# Patient Record
Sex: Male | Born: 1955
Health system: Southern US, Community
[De-identification: ages and names within clinical notes are randomized; demographics above are authoritative.]

## PROBLEM LIST (undated history)

## (undated) DIAGNOSIS — N189 Chronic kidney disease, unspecified: Secondary | ICD-10-CM

## (undated) DIAGNOSIS — I2510 Atherosclerotic heart disease of native coronary artery without angina pectoris: Secondary | ICD-10-CM

## (undated) DIAGNOSIS — F17200 Nicotine dependence, unspecified, uncomplicated: Secondary | ICD-10-CM

## (undated) DIAGNOSIS — R739 Hyperglycemia, unspecified: Secondary | ICD-10-CM

## (undated) DIAGNOSIS — R339 Retention of urine, unspecified: Secondary | ICD-10-CM

## (undated) DIAGNOSIS — I10 Essential (primary) hypertension: Secondary | ICD-10-CM

## (undated) DIAGNOSIS — N23 Unspecified renal colic: Secondary | ICD-10-CM

## (undated) DIAGNOSIS — S4991XA Unspecified injury of right shoulder and upper arm, initial encounter: Secondary | ICD-10-CM

## (undated) DIAGNOSIS — I255 Ischemic cardiomyopathy: Secondary | ICD-10-CM

## (undated) DIAGNOSIS — E785 Hyperlipidemia, unspecified: Secondary | ICD-10-CM

## (undated) DIAGNOSIS — M1990 Unspecified osteoarthritis, unspecified site: Secondary | ICD-10-CM

## (undated) DIAGNOSIS — E119 Type 2 diabetes mellitus without complications: Secondary | ICD-10-CM

## (undated) DIAGNOSIS — Z87442 Personal history of urinary calculi: Secondary | ICD-10-CM

## (undated) DIAGNOSIS — Z978 Presence of other specified devices: Secondary | ICD-10-CM

## (undated) HISTORY — DX: Unspecified renal colic: N23

## (undated) HISTORY — DX: Type 2 diabetes mellitus without complications: E11.9

## (undated) HISTORY — DX: Hyperlipidemia, unspecified: E78.5

## (undated) HISTORY — DX: Hyperglycemia, unspecified: R73.9

## (undated) HISTORY — DX: Essential (primary) hypertension: I10

## (undated) HISTORY — DX: Nicotine dependence, unspecified, uncomplicated: F17.200

## (undated) HISTORY — DX: Unspecified osteoarthritis, unspecified site: M19.90

## (undated) HISTORY — DX: Unspecified injury of right shoulder and upper arm, initial encounter: S49.91XA

---

## 2009-10-11 DIAGNOSIS — S4991XA Unspecified injury of right shoulder and upper arm, initial encounter: Secondary | ICD-10-CM

## 2009-10-11 HISTORY — DX: Unspecified injury of right shoulder and upper arm, initial encounter: S49.91XA

## 2010-08-27 DIAGNOSIS — E785 Hyperlipidemia, unspecified: Secondary | ICD-10-CM

## 2010-08-27 DIAGNOSIS — E119 Type 2 diabetes mellitus without complications: Secondary | ICD-10-CM

## 2010-08-27 DIAGNOSIS — E1169 Type 2 diabetes mellitus with other specified complication: Secondary | ICD-10-CM | POA: Insufficient documentation

## 2010-08-27 DIAGNOSIS — I10 Essential (primary) hypertension: Secondary | ICD-10-CM

## 2010-10-21 ENCOUNTER — Encounter: Payer: Self-pay | Admitting: Physician Assistant

## 2012-08-16 ENCOUNTER — Other Ambulatory Visit: Payer: Self-pay | Admitting: Physician Assistant

## 2012-08-18 ENCOUNTER — Encounter: Payer: Self-pay | Admitting: *Deleted

## 2012-11-07 ENCOUNTER — Ambulatory Visit: Payer: Self-pay | Admitting: Nurse Practitioner

## 2012-11-11 ENCOUNTER — Ambulatory Visit (INDEPENDENT_AMBULATORY_CARE_PROVIDER_SITE_OTHER): Payer: BC Managed Care – PPO | Admitting: Nurse Practitioner

## 2012-11-11 ENCOUNTER — Encounter: Payer: Self-pay | Admitting: Nurse Practitioner

## 2012-11-11 VITALS — BP 148/83 | HR 86 | Temp 97.8°F | Ht 70.0 in | Wt 245.0 lb

## 2012-11-11 DIAGNOSIS — Z125 Encounter for screening for malignant neoplasm of prostate: Secondary | ICD-10-CM

## 2012-11-11 DIAGNOSIS — E119 Type 2 diabetes mellitus without complications: Secondary | ICD-10-CM

## 2012-11-11 DIAGNOSIS — I10 Essential (primary) hypertension: Secondary | ICD-10-CM

## 2012-11-11 DIAGNOSIS — M7989 Other specified soft tissue disorders: Secondary | ICD-10-CM

## 2012-11-11 DIAGNOSIS — M799 Soft tissue disorder, unspecified: Secondary | ICD-10-CM

## 2012-11-11 DIAGNOSIS — E785 Hyperlipidemia, unspecified: Secondary | ICD-10-CM

## 2012-11-11 LAB — COMPLETE METABOLIC PANEL WITH GFR
ALT: 54 U/L — ABNORMAL HIGH (ref 0–53)
AST: 60 U/L — ABNORMAL HIGH (ref 0–37)
Alkaline Phosphatase: 63 U/L (ref 39–117)
BUN: 11 mg/dL (ref 6–23)
Creat: 0.9 mg/dL (ref 0.50–1.35)
Potassium: 5.3 mEq/L (ref 3.5–5.3)

## 2012-11-11 LAB — POCT GLYCOSYLATED HEMOGLOBIN (HGB A1C): Hemoglobin A1C: 7.8

## 2012-11-11 MED ORDER — CHOLINE FENOFIBRATE 135 MG PO CPDR: 135.0000 mg | DELAYED_RELEASE_CAPSULE | Freq: Every day | ORAL | Status: DC

## 2012-11-11 MED ORDER — SITAGLIPTIN PHOS-METFORMIN HCL 50-1000 MG PO TABS: 1.0000 | ORAL_TABLET | Freq: Two times a day (BID) | ORAL | Status: DC

## 2012-11-11 MED ORDER — GLIMEPIRIDE 2 MG PO TABS: 4.0000 mg | ORAL_TABLET | Freq: Two times a day (BID) | ORAL | Status: DC

## 2012-11-11 MED ORDER — ROSUVASTATIN CALCIUM 10 MG PO TABS: 10.0000 mg | ORAL_TABLET | Freq: Every day | ORAL | Status: DC

## 2012-11-11 MED ORDER — AMLODIPINE BESYLATE 10 MG PO TABS: 10.0000 mg | ORAL_TABLET | Freq: Every day | ORAL | Status: DC

## 2012-11-11 MED ORDER — GLYBURIDE 5 MG PO TABS: 5.0000 mg | ORAL_TABLET | Freq: Every day | ORAL | Status: DC

## 2012-11-11 NOTE — Progress Notes (Signed)
Subjective:    Patient ID: Tyler Garner, male    DOB: 1956/04/01, 57 y.o.   MRN: [id number]  Hyperlipidemia This is a chronic problem. The current episode started more than 1 year ago. The problem is uncontrolled. Recent lipid tests were reviewed and are high. There are no known factors aggravating his hyperlipidemia. Pertinent negatives include no leg pain, myalgias or shortness of breath. Current antihyperlipidemic treatment includes statins and fibric acid derivatives. The current treatment provides moderate improvement of lipids. Compliance problems include adherence to diet and adherence to exercise.  Risk factors for coronary artery disease include hypertension and male sex.  Hypertension This is a chronic problem. The current episode started more than 1 year ago. The problem has been waxing and waning since onset. The problem is uncontrolled. Pertinent negatives include no blurred vision, headaches, malaise/fatigue, palpitations, peripheral edema, shortness of breath or sweats. There are no associated agents to hypertension. Risk factors for coronary artery disease include diabetes mellitus, dyslipidemia and male gender. Past treatments include calcium channel blockers and ACE inhibitors (not taking ace like suppose to- didn't realize he needed to stay on it.). The current treatment provides moderate improvement. Compliance problems include diet and exercise.   Diabetes He presents for his follow-up diabetic visit. He has type 2 diabetes mellitus. No MedicAlert identification noted. The initial diagnosis of diabetes was made 3 years ago. His disease course has been stable. There are no hypoglycemic associated symptoms. Pertinent negatives for hypoglycemia include no headaches or sweats. Pertinent negatives for diabetes include no blurred vision, no fatigue, no foot ulcerations, no polydipsia, no polyphagia, no polyuria, no visual change and no weight loss. There are no hypoglycemic complications.  Symptoms are stable. There are no diabetic complications. Risk factors for coronary artery disease include diabetes mellitus, dyslipidemia, hypertension, male sex and obesity. Current diabetic treatment includes oral agent (dual therapy) and diet. He is compliant with treatment most of the time. He is following a generally healthy diet. When asked about meal planning, he reported none. He has not had a previous visit with a dietician. He rarely participates in exercise. His home blood glucose trend is fluctuating minimally. His breakfast blood glucose is taken between 8-9 am. His breakfast blood glucose range is generally 90-110 mg/dl. An ACE inhibitor/angiotensin II receptor blocker is not being taken. He does not see a podiatrist.Eye exam is current (03/2012).   * Nodule at right elbow has been there e a couple of months an d is starting to cause pain and also seems to be getting a little bigger.   Review of Systems  Constitutional: Negative for weight loss, malaise/fatigue and fatigue.  Eyes: Negative for blurred vision.  Respiratory: Negative for shortness of breath.   Cardiovascular: Negative for palpitations.  Endocrine: Negative for polydipsia, polyphagia and polyuria.  Musculoskeletal: Negative for myalgias.  Neurological: Negative for headaches.  All other systems reviewed and are negative.       Objective:   Physical Exam  Constitutional: He is oriented to person, place, and time. He appears well-developed and well-nourished.  HENT:  Head: Normocephalic.  Right Ear: External ear normal.  Left Ear: External ear normal.  Nose: Nose normal.  Mouth/Throat: Oropharynx is clear and moist.  Eyes: EOM are normal. Pupils are equal, round, and reactive to light.  Neck: Normal range of motion. Neck supple. No thyromegaly present.  Cardiovascular: Normal rate, regular rhythm, normal heart sounds and intact distal pulses.   No murmur heard. Pulmonary/Chest: Effort normal and breath sounds  normal. He has no wheezes. He has no rales.  Abdominal: Soft. Bowel sounds are normal.  Genitourinary:  Refuses rectal exam  Musculoskeletal: Normal range of motion.  Neurological: He is alert and oriented to person, place, and time.  Skin: Skin is warm and dry.  Psychiatric: He has a normal mood and affect. His behavior is normal. Judgment and thought content normal.   BP 148/83  Pulse 86  Temp(Src) 97.8 F (36.6 C) (Oral)  Ht 5\' 10"  (1.778 m)  Wt 245 lb (111.131 kg)  BMI 35.15 kg/m2 Results for orders placed in visit on 11/11/12  POCT GLYCOSYLATED HEMOGLOBIN (HGB A1C)      Result Value Range   Hemoglobin A1C 7.8%           Assessment & Plan:   1. Diabetes   2. Other and unspecified hyperlipidemia   3. Screening for prostate cancer   4. Hypertension   5. Soft tissue mass    Orders Placed This Encounter  Procedures  . [place] Misc Soft Tissue    Standing Status: Future     Number of Occurrences:      Standing Expiration Date: 01/11/2014    Order Specific Question:  Reason for Exam (SYMPTOM  OR DIAGNOSIS REQUIRED)    Answer:  mass right inner elbow    Order Specific Question:  Preferred imaging location?    Answer:  [facility] ([place])  . COMPLETE METABOLIC PANEL WITH GFR  . NMR Lipoprofile with Lipids  . PSA  . HgB A1c   Meds ordered this encounter  Medications  . DISCONTD: amLODipine (NORVASC) 10 MG tablet    Sig:   . glyBURIDE (DIABETA) 5 MG tablet    Sig: Take 1 tablet (5 mg total) by mouth daily with breakfast.    Dispense:  30 tablet    Refill:  5    Order Specific Question:  Supervising Provider    Answer:  Ernestina Penna [[id number]]  . amLODipine (NORVASC) 10 MG tablet    Sig: Take 1 tablet (10 mg total) by mouth daily.    Dispense:  30 tablet    Refill:  5    Order Specific Question:  Supervising Provider    Answer:  Ernestina Penna [[id number]]  . Choline Fenofibrate (TRILIPIX) 135 MG capsule    Sig: Take 1 capsule (135 mg total) by mouth daily.    Dispense:   30 capsule    Refill:  5    Order Specific Question:  Supervising Provider    Answer:  Ernestina Penna [[id number]]  . glimepiride (AMARYL) 2 MG tablet    Sig: Take 2 tablets (4 mg total) by mouth 2 (two) times daily.    Dispense:  30 tablet    Refill:  5    Order Specific Question:  Supervising Provider    Answer:  Ernestina Penna [[id number]]  . sitaGLIPtan-metformin (JANUMET) 50-1000 MG per tablet    Sig: Take 1 tablet by mouth 2 (two) times daily with a meal.    Dispense:  60 tablet    Refill:  5    Order Specific Question:  Supervising Provider    Answer:  Ernestina Penna [[id number]]  . rosuvastatin (CRESTOR) 10 MG tablet    Sig: Take 1 tablet (10 mg total) by mouth daily.    Dispense:  30 tablet    Refill:  5    Order Specific Question:  Supervising Provider    Answer:  Ernestina Penna [phone]  Continue all meds Labs pending Diet and exercise encouraged Added glyburide 5mg  PO qd  [name] Daphine Deutscher, [name]

## 2012-11-11 NOTE — Patient Instructions (Signed)
Health Maintenance, Males A healthy lifestyle and preventative care can promote health and wellness.  Maintain regular health, dental, and eye exams.  Eat a healthy diet. Foods like vegetables, fruits, whole grains, low-fat dairy products, and lean protein foods contain the nutrients you need without too many calories. Decrease your intake of foods high in solid fats, added sugars, and salt. Get information about a proper diet from your caregiver, if necessary.  Regular physical exercise is one of the most important things you can do for your health. Most adults should get at least 150 minutes of moderate-intensity exercise (any activity that increases your heart rate and causes you to sweat) each week. In addition, most adults need muscle-strengthening exercises on 2 or more days a week.   Maintain a healthy weight. The body mass index (BMI) is a screening tool to identify possible weight problems. It provides an estimate of body fat based on height and weight. Your caregiver can help determine your BMI, and can help you achieve or maintain a healthy weight. For adults 20 years and older:  A BMI below 18.5 is considered underweight.  A BMI of 18.5 to 24.9 is normal.  A BMI of 25 to 29.9 is considered overweight.  A BMI of 30 and above is considered obese.  Maintain normal blood lipids and cholesterol by exercising and minimizing your intake of saturated fat. Eat a balanced diet with plenty of fruits and vegetables. Blood tests for lipids and cholesterol should begin at age 20 and be repeated every 5 years. If your lipid or cholesterol levels are high, you are over 50, or you are a high risk for heart disease, you may need your cholesterol levels checked more frequently.Ongoing high lipid and cholesterol levels should be treated with medicines, if diet and exercise are not effective.  If you smoke, find out from your caregiver how to quit. If you do not use tobacco, do not start.  If you  choose to drink alcohol, do not exceed 2 drinks per day. One drink is considered to be 12 ounces (355 mL) of beer, 5 ounces (148 mL) of wine, or 1.5 ounces (44 mL) of liquor.  Avoid use of street drugs. Do not share needles with anyone. Ask for help if you need support or instructions about stopping the use of drugs.  High blood pressure causes heart disease and increases the risk of stroke. Blood pressure should be checked at least every 1 to 2 years. Ongoing high blood pressure should be treated with medicines if weight loss and exercise are not effective.  If you are 45 to 58 years old, ask your caregiver if you should take aspirin to prevent heart disease.  Diabetes screening involves taking a blood sample to check your fasting blood sugar level. This should be done once every 3 years, after age 45, if you are within normal weight and without risk factors for diabetes. Testing should be considered at a younger age or be carried out more frequently if you are overweight and have at least 1 risk factor for diabetes.  Colorectal cancer can be detected and often prevented. Most routine colorectal cancer screening begins at the age of 50 and continues through age 75. However, your caregiver may recommend screening at an earlier age if you have risk factors for colon cancer. On a yearly basis, your caregiver may provide home test kits to check for hidden blood in the stool. Use of a small camera at the end of a tube,   to directly examine the colon (sigmoidoscopy or colonoscopy), can detect the earliest forms of colorectal cancer. Talk to your caregiver about this at age 50, when routine screening begins. Direct examination of the colon should be repeated every 5 to 10 years through age 75, unless early forms of pre-cancerous polyps or small growths are found.  Hepatitis C blood testing is recommended for all people born from 1945 through 1965 and any individual with known risks for hepatitis C.  Healthy  men should no longer receive prostate-specific antigen (PSA) blood tests as part of routine cancer screening. Consult with your caregiver about prostate cancer screening.  Testicular cancer screening is not recommended for adolescents or adult males who have no symptoms. Screening includes self-exam, caregiver exam, and other screening tests. Consult with your caregiver about any symptoms you have or any concerns you have about testicular cancer.  Practice safe sex. Use condoms and avoid high-risk sexual practices to reduce the spread of sexually transmitted infections (STIs).  Use sunscreen with a sun protection factor (SPF) of 30 or greater. Apply sunscreen liberally and repeatedly throughout the day. You should seek shade when your shadow is shorter than you. Protect yourself by wearing long sleeves, pants, a wide-brimmed hat, and sunglasses year round, whenever you are outdoors.  Notify your caregiver of new moles or changes in moles, especially if there is a change in shape or color. Also notify your caregiver if a mole is larger than the size of a pencil eraser.  A one-time screening for abdominal aortic aneurysm (AAA) and surgical repair of large AAAs by sound wave imaging (ultrasonography) is recommended for ages 65 to 75 years who are current or former smokers.  Stay current with your immunizations. Document Released: 11/07/2007 Document Revised: 08/03/2011 Document Reviewed: 10/06/2010 [employer] Patient Information 2014 [employer].  

## 2012-11-14 LAB — NMR LIPOPROFILE WITH LIPIDS
Cholesterol, Total: 167 mg/dL (ref <200)
HDL Particle Number: 22.9 umol/L — ABNORMAL LOW (ref >=30.5)
LDL (calc): 76 mg/dL (ref <100)
LDL Particle Number: 1230 nmol/L — ABNORMAL HIGH (ref <1000)
LP-IR Score: 60 — ABNORMAL HIGH (ref <=45)
Triglycerides: 339 mg/dL — ABNORMAL HIGH (ref <150)
VLDL Size: 49.2 nm — ABNORMAL HIGH (ref <=46.6)

## 2012-11-16 ENCOUNTER — Other Ambulatory Visit: Payer: Self-pay | Admitting: Family Medicine

## 2012-11-16 ENCOUNTER — Ambulatory Visit (HOSPITAL_COMMUNITY)
Admission: RE | Admit: 2012-11-16 | Discharge: 2012-11-16 | Disposition: A | Discharge status: Home / Self Care | Payer: BC Managed Care – PPO | Source: Ambulatory Visit | Attending: Nurse Practitioner | Admitting: Nurse Practitioner

## 2012-11-16 ENCOUNTER — Other Ambulatory Visit: Payer: Self-pay | Admitting: Nurse Practitioner

## 2012-11-16 DIAGNOSIS — R229 Localized swelling, mass and lump, unspecified: Secondary | ICD-10-CM | POA: Insufficient documentation

## 2012-11-16 DIAGNOSIS — M7989 Other specified soft tissue disorders: Secondary | ICD-10-CM

## 2012-11-18 ENCOUNTER — Other Ambulatory Visit: Payer: Self-pay | Admitting: Family Medicine

## 2012-11-18 ENCOUNTER — Ambulatory Visit (HOSPITAL_COMMUNITY)
Admission: RE | Admit: 2012-11-18 | Discharge: 2012-11-18 | Disposition: A | Discharge status: Home / Self Care | Payer: BC Managed Care – PPO | Source: Ambulatory Visit | Attending: Family Medicine | Admitting: Family Medicine

## 2012-11-18 DIAGNOSIS — M7989 Other specified soft tissue disorders: Secondary | ICD-10-CM

## 2012-12-02 NOTE — Progress Notes (Signed)
Called patient to come in for psa last visit one wasn't ordered

## 2012-12-05 ENCOUNTER — Other Ambulatory Visit (INDEPENDENT_AMBULATORY_CARE_PROVIDER_SITE_OTHER): Payer: BC Managed Care – PPO

## 2012-12-05 DIAGNOSIS — Z125 Encounter for screening for malignant neoplasm of prostate: Secondary | ICD-10-CM

## 2012-12-05 NOTE — Progress Notes (Signed)
Patient came in for labs only.

## 2012-12-06 LAB — PSA: PSA: 0.68 ng/mL (ref <=4.00)

## 2013-02-21 ENCOUNTER — Encounter: Payer: Self-pay | Admitting: Nurse Practitioner

## 2013-02-21 ENCOUNTER — Ambulatory Visit (INDEPENDENT_AMBULATORY_CARE_PROVIDER_SITE_OTHER): Payer: BC Managed Care – PPO | Admitting: Nurse Practitioner

## 2013-02-21 VITALS — BP 141/86 | HR 86 | Temp 98.0°F | Ht 69.0 in | Wt 235.0 lb

## 2013-02-21 DIAGNOSIS — E785 Hyperlipidemia, unspecified: Secondary | ICD-10-CM

## 2013-02-21 DIAGNOSIS — I10 Essential (primary) hypertension: Secondary | ICD-10-CM

## 2013-02-21 DIAGNOSIS — E119 Type 2 diabetes mellitus without complications: Secondary | ICD-10-CM

## 2013-02-21 DIAGNOSIS — Z23 Encounter for immunization: Secondary | ICD-10-CM

## 2013-02-21 LAB — POCT GLYCOSYLATED HEMOGLOBIN (HGB A1C): Hemoglobin A1C: 7

## 2013-02-21 MED ORDER — CHOLINE FENOFIBRATE 135 MG PO CPDR: 135.0000 mg | DELAYED_RELEASE_CAPSULE | Freq: Every day | ORAL | Status: DC

## 2013-02-21 MED ORDER — ROSUVASTATIN CALCIUM 10 MG PO TABS: 10.0000 mg | ORAL_TABLET | Freq: Every day | ORAL | Status: DC

## 2013-02-21 MED ORDER — SITAGLIPTIN PHOS-METFORMIN HCL 50-1000 MG PO TABS: 1.0000 | ORAL_TABLET | Freq: Two times a day (BID) | ORAL | Status: DC

## 2013-02-21 MED ORDER — GLIMEPIRIDE 2 MG PO TABS: 4.0000 mg | ORAL_TABLET | Freq: Two times a day (BID) | ORAL | Status: DC

## 2013-02-21 MED ORDER — AMLODIPINE BESYLATE 10 MG PO TABS: 10.0000 mg | ORAL_TABLET | Freq: Every day | ORAL | Status: DC

## 2013-02-21 NOTE — Progress Notes (Signed)
Subjective:    Patient ID: Tyler Garner, male    DOB: 1956/02/16, 57 y.o.   MRN: [id number]  Hyperlipidemia This is a chronic problem. The current episode started more than 1 year ago. The problem is uncontrolled. Recent lipid tests were reviewed and are high. There are no known factors aggravating his hyperlipidemia. Pertinent negatives include no leg pain, myalgias or shortness of breath. Current antihyperlipidemic treatment includes statins and fibric acid derivatives. The current treatment provides moderate improvement of lipids. Compliance problems include adherence to diet and adherence to exercise.  Risk factors for coronary artery disease include hypertension and male sex.  Hypertension This is a chronic problem. The current episode started more than 1 year ago. The problem has been waxing and waning since onset. The problem is uncontrolled. Pertinent negatives include no blurred vision, headaches, malaise/fatigue, palpitations, peripheral edema, shortness of breath or sweats. There are no associated agents to hypertension. Risk factors for coronary artery disease include diabetes mellitus, dyslipidemia and male gender. Past treatments include calcium channel blockers and ACE inhibitors (not taking ace like suppose to- didn't realize he needed to stay on it.). The current treatment provides moderate improvement. Compliance problems include diet and exercise.   Diabetes He presents for his follow-up diabetic visit. He has type 2 diabetes mellitus. No MedicAlert identification noted. The initial diagnosis of diabetes was made 3 years ago. His disease course has been stable. There are no hypoglycemic associated symptoms. Pertinent negatives for hypoglycemia include no headaches or sweats. Pertinent negatives for diabetes include no blurred vision, no fatigue, no foot ulcerations, no polydipsia, no polyphagia, no polyuria, no visual change and no weight loss. There are no hypoglycemic complications.  Symptoms are stable. There are no diabetic complications. Risk factors for coronary artery disease include diabetes mellitus, dyslipidemia, hypertension, male sex and obesity. Current diabetic treatment includes oral agent (dual therapy) and diet. He is compliant with treatment most of the time. He is following a generally healthy diet. When asked about meal planning, he reported none. He has not had a previous visit with a dietician. He rarely participates in exercise. His home blood glucose trend is fluctuating minimally. His breakfast blood glucose is taken between 8-9 am. His breakfast blood glucose range is generally 90-110 mg/dl. An ACE inhibitor/angiotensin II receptor blocker is not being taken. He does not see a podiatrist.Eye exam is current (03-May-2012).   *Wife died 3 months ago   Review of Systems  Constitutional: Negative for weight loss, malaise/fatigue and fatigue.  Eyes: Negative for blurred vision.  Respiratory: Negative for shortness of breath.   Cardiovascular: Negative for palpitations.  Endocrine: Negative for polydipsia, polyphagia and polyuria.  Musculoskeletal: Negative for myalgias.  Neurological: Negative for headaches.  All other systems reviewed and are negative.       Objective:   Physical Exam  Constitutional: He is oriented to person, place, and time. He appears well-developed and well-nourished.  HENT:  Head: Normocephalic.  Right Ear: External ear normal.  Left Ear: External ear normal.  Nose: Nose normal.  Mouth/Throat: Oropharynx is clear and moist.  Eyes: EOM are normal. Pupils are equal, round, and reactive to light.  Neck: Normal range of motion. Neck supple. No thyromegaly present.  Cardiovascular: Normal rate, regular rhythm, normal heart sounds and intact distal pulses.   No murmur heard. Pulmonary/Chest: Effort normal and breath sounds normal. He has no wheezes. He has no rales.  Abdominal: Soft. Bowel sounds are normal.  Genitourinary:   Refuses rectal exam  Musculoskeletal: Normal range of motion.  Neurological: He is alert and oriented to person, place, and time.  Skin: Skin is warm and dry.  Psychiatric: He has a normal mood and affect. His behavior is normal. Judgment and thought content normal.   BP 141/86  Pulse 86  Temp(Src) 98 F (36.7 C) (Oral)  Ht 5\' 9"  (1.753 m)  Wt 235 lb (106.595 kg)  BMI 34.69 kg/m2 Results for orders placed in visit on 02/21/13  POCT GLYCOSYLATED HEMOGLOBIN (HGB A1C)      Result Value Range   Hemoglobin A1C 7.0           Assessment & Plan:   1. HTN (hypertension)   2. Hyperlipidemia   3. DM (diabetes mellitus)    Orders Placed This Encounter  Procedures  . CMP14+EGFR  . NMR, lipoprofile  . POCT glycosylated hemoglobin (Hb A1C)   Meds ordered this encounter  Medications  . glimepiride (AMARYL) 2 MG tablet    Sig: Take 2 tablets (4 mg total) by mouth 2 (two) times daily.    Dispense:  30 tablet    Refill:  5    Order Specific Question:  Supervising Provider    Answer:  Ernestina Penna [[id number]]  . Choline Fenofibrate (TRILIPIX) 135 MG capsule    Sig: Take 1 capsule (135 mg total) by mouth daily.    Dispense:  30 capsule    Refill:  5    Order Specific Question:  Supervising Provider    Answer:  Ernestina Penna [[id number]]  . rosuvastatin (CRESTOR) 10 MG tablet    Sig: Take 1 tablet (10 mg total) by mouth daily.    Dispense:  30 tablet    Refill:  5    Order Specific Question:  Supervising Provider    Answer:  Ernestina Penna [[id number]]  . amLODipine (NORVASC) 10 MG tablet    Sig: Take 1 tablet (10 mg total) by mouth daily.    Dispense:  30 tablet    Refill:  5    Order Specific Question:  Supervising Provider    Answer:  Ernestina Penna [[id number]]  . sitaGLIPtin-metformin (JANUMET) 50-1000 MG per tablet    Sig: Take 1 tablet by mouth 2 (two) times daily with a meal.    Dispense:  60 tablet    Refill:  5    Order Specific Question:  Supervising Provider    Answer:   Deborra Medina    Continue all meds Labs pending Diet and exercise encouraged   [name] Daphine Deutscher, [name]

## 2013-02-21 NOTE — Patient Instructions (Signed)

## 2013-02-23 ENCOUNTER — Other Ambulatory Visit: Payer: Self-pay | Admitting: Nurse Practitioner

## 2013-02-23 LAB — CMP14+EGFR
Albumin/Globulin Ratio: 2.1 (ref 1.1–2.5)
Albumin: 4.7 g/dL (ref 3.5–5.5)
Alkaline Phosphatase: 71 IU/L (ref 39–117)
BUN/Creatinine Ratio: 16 (ref 9–20)
BUN: 12 mg/dL (ref 6–24)
CO2: 23 mmol/L (ref 18–29)
Chloride: 102 mmol/L (ref 97–108)
Creatinine, Ser: 0.73 mg/dL — ABNORMAL LOW (ref 0.76–1.27)
GFR calc Af Amer: 118 mL/min/{1.73_m2} (ref >59)
GFR calc non Af Amer: 102 mL/min/{1.73_m2} (ref >59)
Globulin, Total: 2.2 g/dL (ref 1.5–4.5)
Sodium: 143 mmol/L (ref 134–144)
Total Bilirubin: 0.3 mg/dL (ref 0.0–1.2)

## 2013-02-23 LAB — NMR, LIPOPROFILE
Cholesterol: 183 mg/dL (ref <200)
HDL Particle Number: 20.1 umol/L — ABNORMAL LOW (ref >=30.5)
LDL Particle Number: 2746 nmol/L — ABNORMAL HIGH (ref <1000)
LDLC SERPL CALC-MCNC: 112 mg/dL — ABNORMAL HIGH (ref <100)
LP-IR Score: 73 — ABNORMAL HIGH (ref <=45)
Small LDL Particle Number: >2300 nmol/L — ABNORMAL HIGH (ref <=527)

## 2013-02-23 MED ORDER — ROSUVASTATIN CALCIUM 20 MG PO TABS: 20.0000 mg | ORAL_TABLET | Freq: Every day | ORAL | Status: DC

## 2013-04-28 ENCOUNTER — Other Ambulatory Visit: Payer: Self-pay | Admitting: *Deleted

## 2013-04-28 DIAGNOSIS — E119 Type 2 diabetes mellitus without complications: Secondary | ICD-10-CM

## 2013-04-28 MED ORDER — GLIMEPIRIDE 2 MG PO TABS: 4.0000 mg | ORAL_TABLET | Freq: Two times a day (BID) | ORAL | Status: DC

## 2013-05-12 ENCOUNTER — Ambulatory Visit (INDEPENDENT_AMBULATORY_CARE_PROVIDER_SITE_OTHER): Payer: BC Managed Care – PPO | Admitting: Nurse Practitioner

## 2013-05-12 ENCOUNTER — Encounter: Payer: Self-pay | Admitting: Nurse Practitioner

## 2013-05-12 ENCOUNTER — Encounter (INDEPENDENT_AMBULATORY_CARE_PROVIDER_SITE_OTHER): Payer: Self-pay

## 2013-05-12 VITALS — BP 139/79 | HR 93 | Temp 97.8°F | Ht 69.0 in | Wt 239.0 lb

## 2013-05-12 DIAGNOSIS — J029 Acute pharyngitis, unspecified: Secondary | ICD-10-CM

## 2013-05-12 DIAGNOSIS — J322 Chronic ethmoidal sinusitis: Secondary | ICD-10-CM

## 2013-05-12 MED ORDER — AZITHROMYCIN 250 MG PO TABS: ORAL_TABLET | ORAL | Status: DC

## 2013-05-12 NOTE — Patient Instructions (Signed)

## 2013-05-12 NOTE — Progress Notes (Signed)
   Subjective:    Patient ID: Tyler Garner, male    DOB: 28-May-1955, 57 y.o.   MRN: [id number]  HPI Patient in today C/O cough and congestion- no fever- started several days ago    Review of Systems  Constitutional: Positive for fever, chills and appetite change.  HENT: Positive for congestion, postnasal drip, rhinorrhea and sinus pressure.   Respiratory: Positive for cough.   Cardiovascular: Negative.   Gastrointestinal: Negative.   Genitourinary: Negative.   Musculoskeletal: Negative.        Objective:   Physical Exam  Constitutional: He is oriented to person, place, and time. He appears well-developed and well-nourished.  HENT:  Right Ear: Hearing, tympanic membrane, external ear and ear canal normal.  Left Ear: Hearing, tympanic membrane, external ear and ear canal normal.  Nose: Mucosal edema and rhinorrhea present. Right sinus exhibits maxillary sinus tenderness. Right sinus exhibits no frontal sinus tenderness. Left sinus exhibits maxillary sinus tenderness. Left sinus exhibits no frontal sinus tenderness.  Mouth/Throat: Uvula is midline, oropharynx is clear and moist and mucous membranes are normal.  Cardiovascular: Normal rate, regular rhythm and normal heart sounds.   Pulmonary/Chest: Effort normal and breath sounds normal.  Neurological: He is alert and oriented to person, place, and time.  Skin: Skin is warm.    BP 139/79  Pulse 93  Temp(Src) 97.8 F (36.6 C) (Oral)  Ht 5\' 9"  (1.753 m)  Wt 239 lb (108.41 kg)  BMI 35.28 kg/m2       Assessment & Plan:   1. Sore throat   2. Ethmoid sinusitis    Meds ordered this encounter  Medications  . azithromycin (ZITHROMAX Z-PAK) 250 MG tablet    Sig: As directed    Dispense:  6 each    Refill:  0    Order Specific Question:  Supervising Provider    Answer:  Ernestina Penna [[id number]]   1. Take meds as prescribed 2. Use a cool mist humidifier especially during the winter months and when heat has  been humid. 3.  Use saline nose sprays frequently 4. Saline irrigations of the nose can be very helpful if done frequently.  * 4X daily for 1 week*  * Use of a nettie pot can be helpful with this. Follow directions with this* 5. Drink plenty of fluids 6. Keep thermostat turn down low 7.For any cough or congestion  Use plain Mucinex- regular strength or max strength is fine   * Children- consult with Pharmacist for dosing 8. For fever or aces or pains- take tylenol or ibuprofen appropriate for age and weight.  * for fevers greater than 101 orally you may alternate ibuprofen and tylenol every  3 hours.   [name] Daphine Deutscher, [name]

## 2013-05-15 ENCOUNTER — Telehealth: Payer: Self-pay | Admitting: Nurse Practitioner

## 2013-05-22 ENCOUNTER — Other Ambulatory Visit: Payer: Self-pay | Admitting: Nurse Practitioner

## 2013-05-22 NOTE — Telephone Encounter (Signed)
U/s request sent to referrals

## 2013-05-25 DIAGNOSIS — I219 Acute myocardial infarction, unspecified: Secondary | ICD-10-CM

## 2013-05-25 HISTORY — DX: Acute myocardial infarction, unspecified: I21.9

## 2013-06-06 ENCOUNTER — Encounter: Payer: Self-pay | Admitting: Nurse Practitioner

## 2013-06-06 ENCOUNTER — Ambulatory Visit (INDEPENDENT_AMBULATORY_CARE_PROVIDER_SITE_OTHER): Payer: BC Managed Care – PPO | Admitting: Nurse Practitioner

## 2013-06-06 VITALS — BP 140/83 | HR 83 | Temp 98.5°F | Ht 69.0 in | Wt 243.0 lb

## 2013-06-06 DIAGNOSIS — I10 Essential (primary) hypertension: Secondary | ICD-10-CM

## 2013-06-06 DIAGNOSIS — E785 Hyperlipidemia, unspecified: Secondary | ICD-10-CM

## 2013-06-06 DIAGNOSIS — E119 Type 2 diabetes mellitus without complications: Secondary | ICD-10-CM

## 2013-06-06 LAB — POCT GLYCOSYLATED HEMOGLOBIN (HGB A1C): Hemoglobin A1C: 6.6

## 2013-06-06 MED ORDER — SITAGLIPTIN PHOS-METFORMIN HCL 50-1000 MG PO TABS: 1.0000 | ORAL_TABLET | Freq: Two times a day (BID) | ORAL | Status: DC

## 2013-06-06 MED ORDER — GLIMEPIRIDE 4 MG PO TABS: 4.0000 mg | ORAL_TABLET | Freq: Two times a day (BID) | ORAL | Status: DC

## 2013-06-06 MED ORDER — AMLODIPINE BESYLATE 10 MG PO TABS: 10.0000 mg | ORAL_TABLET | Freq: Every day | ORAL | Status: DC

## 2013-06-06 MED ORDER — CHOLINE FENOFIBRATE 135 MG PO CPDR: 135.0000 mg | DELAYED_RELEASE_CAPSULE | Freq: Every day | ORAL | Status: DC

## 2013-06-06 MED ORDER — ROSUVASTATIN CALCIUM 20 MG PO TABS: 20.0000 mg | ORAL_TABLET | Freq: Every day | ORAL | Status: DC

## 2013-06-06 NOTE — Patient Instructions (Signed)
Diabetes and Foot Care Diabetes may cause you to have problems because of poor blood supply (circulation) to your feet and legs. This may cause the skin on your feet to become thinner, break easier, and heal more slowly. Your skin may become dry, and the skin may peel and crack. You may also have nerve damage in your legs and feet causing decreased feeling in them. You may not notice minor injuries to your feet that could lead to infections or more serious problems. Taking care of your feet is one of the most important things you can do for yourself.  HOME CARE INSTRUCTIONS  Wear shoes at all times, even in the house. Do not go barefoot. Bare feet are easily injured.  Check your feet daily for blisters, cuts, and redness. If you cannot see the bottom of your feet, use a mirror or ask someone for help.  Wash your feet with warm water (do not use hot water) and mild soap. Then pat your feet and the areas between your toes until they are completely dry. Do not soak your feet as this can dry your skin.  Apply a moisturizing lotion or petroleum jelly (that does not contain alcohol and is unscented) to the skin on your feet and to dry, brittle toenails. Do not apply lotion between your toes.  Trim your toenails straight across. Do not dig under them or around the cuticle. File the edges of your nails with an emery board or nail file.  Do not cut corns or calluses or try to remove them with medicine.  Wear clean socks or stockings every day. Make sure they are not too tight. Do not wear knee-high stockings since they may decrease blood flow to your legs.  Wear shoes that fit properly and have enough cushioning. To break in new shoes, wear them for just a few hours a day. This prevents you from injuring your feet. Always look in your shoes before you put them on to be sure there are no objects inside.  Do not cross your legs. This may decrease the blood flow to your feet.  If you find a minor scrape,  cut, or break in the skin on your feet, keep it and the skin around it clean and dry. These areas may be cleansed with mild soap and water. Do not cleanse the area with peroxide, alcohol, or iodine.  When you remove an adhesive bandage, be sure not to damage the skin around it.  If you have a wound, look at it several times a day to make sure it is healing.  Do not use heating pads or hot water bottles. They may burn your skin. If you have lost feeling in your feet or legs, you may not know it is happening until it is too late.  Make sure your health care provider performs a complete foot exam at least annually or more often if you have foot problems. Report any cuts, sores, or bruises to your health care provider immediately. SEEK MEDICAL CARE IF:   You have an injury that is not healing.  You have cuts or breaks in the skin.  You have an ingrown nail.  You notice redness on your legs or feet.  You feel burning or tingling in your legs or feet.  You have pain or cramps in your legs and feet.  Your legs or feet are numb.  Your feet always feel cold. SEEK IMMEDIATE MEDICAL CARE IF:   There is increasing redness,   swelling, or pain in or around a wound.  There is a red line that goes up your leg.  Pus is coming from a wound.  You develop a fever or as directed by your health care provider.  You notice a bad smell coming from an ulcer or wound. Document Released: 05/08/2000 Document Revised: 01/11/2013 Document Reviewed: 10/18/2012 Exit[employer] Patient Information 2014 [employer].  

## 2013-06-06 NOTE — Progress Notes (Signed)
Subjective:    Patient ID: Tyler Garner, male    DOB: [birth year], 58 y.o.   MRN: [id number]  Patient here today for follow up- no changes since last visit.  Hyperlipidemia This is a chronic problem. The current episode started more than 1 year ago. The problem is uncontrolled. Recent lipid tests were reviewed and are high. There are no known factors aggravating his hyperlipidemia. Pertinent negatives include no leg pain, myalgias or shortness of breath. Current antihyperlipidemic treatment includes statins and fibric acid derivatives. The current treatment provides moderate improvement of lipids. Compliance problems include adherence to diet and adherence to exercise.  Risk factors for coronary artery disease include hypertension and male sex.  Hypertension This is a chronic problem. The current episode started more than 1 year ago. The problem has been waxing and waning since onset. The problem is uncontrolled. Pertinent negatives include no blurred vision, headaches, malaise/fatigue, palpitations, peripheral edema, shortness of breath or sweats. There are no associated agents to hypertension. Risk factors for coronary artery disease include diabetes mellitus, dyslipidemia and male gender. Past treatments include calcium channel blockers and ACE inhibitors (not taking ace like suppose to- didn't realize he needed to stay on it.). The current treatment provides moderate improvement. Compliance problems include diet and exercise.   Diabetes He presents for his follow-up diabetic visit. He has type 2 diabetes mellitus. No MedicAlert identification noted. The initial diagnosis of diabetes was made 3 years ago. His disease course has been stable. There are no hypoglycemic associated symptoms. Pertinent negatives for hypoglycemia include no headaches or sweats. Pertinent negatives for diabetes include no blurred vision, no fatigue, no foot ulcerations, no polydipsia, no polyphagia, no polyuria, no visual  change and no weight loss. There are no hypoglycemic complications. Symptoms are stable. There are no diabetic complications. Risk factors for coronary artery disease include diabetes mellitus, dyslipidemia, hypertension, male sex and obesity. Current diabetic treatment includes oral agent (dual therapy) and diet. He is compliant with treatment most of the time. He is following a generally healthy diet. When asked about meal planning, he reported none. He has not had a previous visit with a dietician. He rarely participates in exercise. His home blood glucose trend is fluctuating minimally. His breakfast blood glucose is taken between 8-9 am. His breakfast blood glucose range is generally 90-110 mg/dl. An ACE inhibitor/angiotensin II receptor blocker is not being taken. He does not see a podiatrist.Eye exam is current (2012/04/05).   *Wife died 5 months ago   Review of Systems  Constitutional: Negative for weight loss, malaise/fatigue and fatigue.  Eyes: Negative for blurred vision.  Respiratory: Negative for shortness of breath.   Cardiovascular: Negative for palpitations.  Endocrine: Negative for polydipsia, polyphagia and polyuria.  Musculoskeletal: Negative for myalgias.  Neurological: Negative for headaches.  All other systems reviewed and are negative.       Objective:   Physical Exam  Constitutional: He is oriented to person, place, and time. He appears well-developed and well-nourished.  HENT:  Head: Normocephalic.  Right Ear: External ear normal.  Left Ear: External ear normal.  Nose: Nose normal.  Mouth/Throat: Oropharynx is clear and moist.  Eyes: EOM are normal. Pupils are equal, round, and reactive to light.  Neck: Normal range of motion. Neck supple. No thyromegaly present.  Cardiovascular: Normal rate, regular rhythm, normal heart sounds and intact distal pulses.   No murmur heard. Pulmonary/Chest: Effort normal and breath sounds normal. He has no wheezes. He has no rales.   Abdominal:  Soft. Bowel sounds are normal.  Genitourinary:  Refuses rectal exam  Musculoskeletal: Normal range of motion.  Neurological: He is alert and oriented to person, place, and time.  Skin: Skin is warm and dry.  Psychiatric: He has a normal mood and affect. His behavior is normal. Judgment and thought content normal.   BP 140/83  Pulse 83  Temp(Src) 98.5 F (36.9 C) (Oral)  Ht _0  (1.753 m)  Wt 243 lb (110.224 kg)  BMI 35.87 kg/m2  Results for orders placed in visit on 06/06/13  POCT GLYCOSYLATED HEMOGLOBIN (HGB A1C)      Result Value Range   Hemoglobin A1C 6.6         Assessment & Plan:   1. HTN (hypertension)   2. Hyperlipidemia   3. DM (diabetes mellitus)    Meds ordered this encounter  Medications  . sitaGLIPtin-metformin (JANUMET) 50-1000 MG per tablet    Sig: Take 1 tablet by mouth 2 (two) times daily with a meal.    Dispense:  60 tablet    Refill:  5    Order Specific Question:  Supervising Provider    Answer:  Chipper Herb [[id number]]  . amLODipine (NORVASC) 10 MG tablet    Sig: Take 1 tablet (10 mg total) by mouth daily.    Dispense:  30 tablet    Refill:  5    Order Specific Question:  Supervising Provider    Answer:  Chipper Herb [[id number]]  . Choline Fenofibrate (TRILIPIX) 135 MG capsule    Sig: Take 1 capsule (135 mg total) by mouth daily.    Dispense:  30 capsule    Refill:  5    Order Specific Question:  Supervising Provider    Answer:  Chipper Herb [[id number]]  . rosuvastatin (CRESTOR) 20 MG tablet    Sig: Take 1 tablet (20 mg total) by mouth daily.    Dispense:  30 tablet    Refill:  5    Order Specific Question:  Supervising Provider    Answer:  Chipper Herb [[id number]]  . glimepiride (AMARYL) 4 MG tablet    Sig: Take 1 tablet (4 mg total) by mouth 2 (two) times daily.    Dispense:  60 tablet    Refill:  5    Order Specific Question:  Supervising Provider    Answer:  Chipper Herb [[id number]]   Orders Placed This Encounter  Procedures   . CMP14+EGFR  . NMR, lipoprofile  . POCT glycosylated hemoglobin (Hb A1C)  hemmocult cards given COntinue all meds Diet and exercise encouraged Follow up in 3  Months  Bouse, [name]

## 2013-06-07 LAB — CMP14+EGFR
ALBUMIN: 4.8 g/dL (ref 3.5–5.5)
ALT: 29 IU/L (ref 0–44)
AST: 31 IU/L (ref 0–40)
Albumin/Globulin Ratio: 2.1 (ref 1.1–2.5)
Alkaline Phosphatase: 72 IU/L (ref 39–117)
BUN/Creatinine Ratio: 16 (ref 9–20)
BUN: 14 mg/dL (ref 6–24)
CALCIUM: 10.4 mg/dL — AB (ref 8.7–10.2)
CO2: 25 mmol/L (ref 18–29)
CREATININE: 0.86 mg/dL (ref 0.76–1.27)
Chloride: 102 mmol/L (ref 97–108)
GFR calc Af Amer: 110 mL/min/{1.73_m2} (ref >59)
GFR calc non Af Amer: 96 mL/min/{1.73_m2} (ref >59)
Globulin, Total: 2.3 g/dL (ref 1.5–4.5)
Glucose: 150 mg/dL — ABNORMAL HIGH (ref 65–99)
Potassium: 4.6 mmol/L (ref 3.5–5.2)
SODIUM: 143 mmol/L (ref 134–144)
Total Bilirubin: <0.2 mg/dL (ref 0.0–1.2)
Total Protein: 7.1 g/dL (ref 6.0–8.5)

## 2013-06-07 LAB — NMR, LIPOPROFILE
CHOLESTEROL: 187 mg/dL (ref <200)
HDL Cholesterol by NMR: 27 mg/dL — ABNORMAL LOW (ref >=40)
HDL Particle Number: 22.6 umol/L — ABNORMAL LOW (ref >=30.5)
LDL PARTICLE NUMBER: 2404 nmol/L — AB (ref <1000)
LDL SIZE: 19.6 nm — AB (ref >20.5)
LDLC SERPL CALC-MCNC: 123 mg/dL — AB (ref <100)
LP-IR SCORE: 74 — AB (ref <=45)
Small LDL Particle Number: 2092 nmol/L — ABNORMAL HIGH (ref <=527)
Triglycerides by NMR: 186 mg/dL — ABNORMAL HIGH (ref <150)

## 2013-06-08 ENCOUNTER — Telehealth: Payer: Self-pay | Admitting: *Deleted

## 2013-06-08 ENCOUNTER — Telehealth: Payer: Self-pay | Admitting: Family Medicine

## 2013-06-08 NOTE — Telephone Encounter (Signed)
Message copied by Azalee Course on Thu Jun 08, 2013 11:25 AM ------      Message from: Bennie Pierini      Created: Thu Jun 08, 2013  9:35 AM       hgba1c looks good      Kidney and liver function normal      LDL look TERRIBLE- make sure takes crestor daily- strict low fat diet and exercise- recheck in 3 months ------

## 2013-06-08 NOTE — Telephone Encounter (Signed)
Message copied by Tamera Punt on Thu Jun 08, 2013 10:53 AM ------      Message from: Bennie Pierini      Created: Thu Jun 08, 2013  9:35 AM       hgba1c looks good      Kidney and liver function normal      LDL look TERRIBLE- make sure takes crestor daily- strict low fat diet and exercise- recheck in 3 months ------

## 2013-06-09 NOTE — Telephone Encounter (Signed)
Detailed message left with results.

## 2013-06-21 ENCOUNTER — Other Ambulatory Visit: Payer: Self-pay

## 2013-06-21 ENCOUNTER — Encounter (HOSPITAL_COMMUNITY)
Admission: EM | Disposition: A | Discharge status: Home / Self Care | Payer: BC Managed Care – PPO | Source: Home / Self Care | Attending: Cardiology

## 2013-06-21 ENCOUNTER — Emergency Department (HOSPITAL_COMMUNITY): Payer: BC Managed Care – PPO

## 2013-06-21 ENCOUNTER — Encounter (HOSPITAL_COMMUNITY): Payer: Self-pay | Admitting: Emergency Medicine

## 2013-06-21 ENCOUNTER — Inpatient Hospital Stay (HOSPITAL_COMMUNITY)
Admission: EM | Admit: 2013-06-21 | Discharge: 2013-06-24 | DRG: 247 | Disposition: A | Discharge status: Home / Self Care | Payer: BC Managed Care – PPO | Attending: Cardiology | Admitting: Cardiology

## 2013-06-21 DIAGNOSIS — I10 Essential (primary) hypertension: Secondary | ICD-10-CM

## 2013-06-21 DIAGNOSIS — I2119 ST elevation (STEMI) myocardial infarction involving other coronary artery of inferior wall: Secondary | ICD-10-CM

## 2013-06-21 DIAGNOSIS — I219 Acute myocardial infarction, unspecified: Secondary | ICD-10-CM | POA: Diagnosis present

## 2013-06-21 DIAGNOSIS — I2510 Atherosclerotic heart disease of native coronary artery without angina pectoris: Secondary | ICD-10-CM

## 2013-06-21 DIAGNOSIS — Z87891 Personal history of nicotine dependence: Secondary | ICD-10-CM

## 2013-06-21 DIAGNOSIS — I255 Ischemic cardiomyopathy: Secondary | ICD-10-CM

## 2013-06-21 DIAGNOSIS — Z87442 Personal history of urinary calculi: Secondary | ICD-10-CM

## 2013-06-21 DIAGNOSIS — I472 Ventricular tachycardia, unspecified: Secondary | ICD-10-CM | POA: Diagnosis present

## 2013-06-21 DIAGNOSIS — Z955 Presence of coronary angioplasty implant and graft: Secondary | ICD-10-CM

## 2013-06-21 DIAGNOSIS — E785 Hyperlipidemia, unspecified: Secondary | ICD-10-CM | POA: Diagnosis present

## 2013-06-21 DIAGNOSIS — I213 ST elevation (STEMI) myocardial infarction of unspecified site: Secondary | ICD-10-CM

## 2013-06-21 DIAGNOSIS — E1169 Type 2 diabetes mellitus with other specified complication: Secondary | ICD-10-CM | POA: Diagnosis present

## 2013-06-21 DIAGNOSIS — I959 Hypotension, unspecified: Secondary | ICD-10-CM | POA: Diagnosis present

## 2013-06-21 DIAGNOSIS — Z833 Family history of diabetes mellitus: Secondary | ICD-10-CM

## 2013-06-21 DIAGNOSIS — I252 Old myocardial infarction: Secondary | ICD-10-CM | POA: Diagnosis present

## 2013-06-21 DIAGNOSIS — I4729 Other ventricular tachycardia: Secondary | ICD-10-CM

## 2013-06-21 DIAGNOSIS — E119 Type 2 diabetes mellitus without complications: Secondary | ICD-10-CM | POA: Diagnosis present

## 2013-06-21 HISTORY — DX: Ischemic cardiomyopathy: I25.5

## 2013-06-21 HISTORY — DX: Atherosclerotic heart disease of native coronary artery without angina pectoris: I25.10

## 2013-06-21 HISTORY — PX: OTHER SURGICAL HISTORY: SHX169

## 2013-06-21 HISTORY — PX: LEFT HEART CATHETERIZATION WITH CORONARY ANGIOGRAM: SHX5451

## 2013-06-21 LAB — POCT I-STAT TROPONIN I: Troponin i, poc: 0.18 ng/mL (ref 0.00–0.08)

## 2013-06-21 LAB — CREATININE, SERUM
Creatinine, Ser: 0.81 mg/dL (ref 0.50–1.35)
GFR calc Af Amer: >90 mL/min (ref >90)

## 2013-06-21 LAB — CBC
HCT: 41.3 % (ref 39.0–52.0)
HEMATOCRIT: 38.9 % — AB (ref 39.0–52.0)
HEMOGLOBIN: 13.8 g/dL (ref 13.0–17.0)
HEMOGLOBIN: 13.3 g/dL (ref 13.0–17.0)
MCH: 27.5 pg (ref 26.0–34.0)
MCH: 27.9 pg (ref 26.0–34.0)
MCHC: 33.4 g/dL (ref 30.0–36.0)
MCHC: 34.2 g/dL (ref 30.0–36.0)
MCV: 82.3 fL (ref 78.0–100.0)
MCV: 81.7 fL (ref 78.0–100.0)
Platelets: 311 10*3/uL (ref 150–400)
Platelets: 287 10*3/uL (ref 150–400)
RBC: 5.02 MIL/uL (ref 4.22–5.81)
RBC: 4.76 MIL/uL (ref 4.22–5.81)
RDW: 14 % (ref 11.5–15.5)
RDW: 14 % (ref 11.5–15.5)
WBC: 9.1 10*3/uL (ref 4.0–10.5)
WBC: 9.2 10*3/uL (ref 4.0–10.5)

## 2013-06-21 LAB — POCT I-STAT, CHEM 8
BUN: 15 mg/dL (ref 6–23)
CREATININE: 0.9 mg/dL (ref 0.50–1.35)
Calcium, Ion: 1.18 mmol/L (ref 1.12–1.23)
Chloride: 101 mEq/L (ref 96–112)
GLUCOSE: 199 mg/dL — AB (ref 70–99)
HCT: 46 % (ref 39.0–52.0)
HEMOGLOBIN: 15.6 g/dL (ref 13.0–17.0)
Potassium: 3.8 mEq/L (ref 3.7–5.3)
Sodium: 141 mEq/L (ref 137–147)
TCO2: 25 mmol/L (ref 0–100)

## 2013-06-21 LAB — PROTIME-INR
INR: 1.04 (ref 0.00–1.49)
PROTHROMBIN TIME: 13.4 s (ref 11.6–15.2)

## 2013-06-21 LAB — COMPREHENSIVE METABOLIC PANEL
ALK PHOS: 80 U/L (ref 39–117)
ALT: 32 U/L (ref 0–53)
AST: 34 U/L (ref 0–37)
Albumin: 4.7 g/dL (ref 3.5–5.2)
BUN: 15 mg/dL (ref 6–23)
CALCIUM: 9.8 mg/dL (ref 8.4–10.5)
CO2: 26 mEq/L (ref 19–32)
Chloride: 97 mEq/L (ref 96–112)
Creatinine, Ser: 0.9 mg/dL (ref 0.50–1.35)
GFR calc non Af Amer: >90 mL/min (ref >90)
GLUCOSE: 203 mg/dL — AB (ref 70–99)
Potassium: 3.7 mEq/L (ref 3.7–5.3)
Sodium: 139 mEq/L (ref 137–147)
Total Bilirubin: 0.3 mg/dL (ref 0.3–1.2)
Total Protein: 7.9 g/dL (ref 6.0–8.3)

## 2013-06-21 SURGERY — LEFT HEART CATHETERIZATION WITH CORONARY ANGIOGRAM
Anesthesia: LOCAL

## 2013-06-21 MED ORDER — BIVALIRUDIN 250 MG IV SOLR
INTRAVENOUS | Status: AC
  Filled 2013-06-21: qty 250

## 2013-06-21 MED ORDER — MIDAZOLAM HCL 2 MG/2ML IJ SOLN
INTRAMUSCULAR | Status: AC
  Filled 2013-06-21: qty 2

## 2013-06-21 MED ORDER — INSULIN ASPART 100 UNIT/ML ~~LOC~~ SOLN
0.0000 [IU] | Freq: Three times a day (TID) | SUBCUTANEOUS | Status: DC
  Administered 2013-06-22 (×2): 5 [IU] via SUBCUTANEOUS
  Administered 2013-06-22 – 2013-06-23 (×2): 3 [IU] via SUBCUTANEOUS
  Administered 2013-06-23 (×2): 2 [IU] via SUBCUTANEOUS

## 2013-06-21 MED ORDER — LIDOCAINE HCL (PF) 1 % IJ SOLN
INTRAMUSCULAR | Status: AC
  Filled 2013-06-21: qty 30

## 2013-06-21 MED ORDER — TICAGRELOR 90 MG PO TABS
ORAL_TABLET | ORAL | Status: AC
  Filled 2013-06-21: qty 2

## 2013-06-21 MED ORDER — HEPARIN (PORCINE) IN NACL 2-0.9 UNIT/ML-% IJ SOLN
INTRAMUSCULAR | Status: AC
  Filled 2013-06-21: qty 2000

## 2013-06-21 MED ORDER — NON FORMULARY: 20.0000 mg | Freq: Every day | Status: DC

## 2013-06-21 MED ORDER — TICAGRELOR 90 MG PO TABS
90.0000 mg | ORAL_TABLET | Freq: Two times a day (BID) | ORAL | Status: DC
  Administered 2013-06-22 – 2013-06-24 (×5): 90 mg via ORAL
  Filled 2013-06-21 (×7): qty 1

## 2013-06-21 MED ORDER — FENTANYL CITRATE 0.05 MG/ML IJ SOLN
INTRAMUSCULAR | Status: AC
  Filled 2013-06-21: qty 2

## 2013-06-21 MED ORDER — ASPIRIN 81 MG PO CHEW
324.0000 mg | CHEWABLE_TABLET | Freq: Once | ORAL | Status: AC
  Administered 2013-06-21: 162 mg via ORAL
  Filled 2013-06-21: qty 4

## 2013-06-21 MED ORDER — ROSUVASTATIN CALCIUM 20 MG PO TABS
20.0000 mg | ORAL_TABLET | Freq: Every day | ORAL | Status: DC
  Administered 2013-06-22 – 2013-06-23 (×2): 20 mg via ORAL
  Filled 2013-06-21 (×4): qty 1

## 2013-06-21 MED ORDER — MORPHINE SULFATE 2 MG/ML IJ SOLN
2.0000 mg | INTRAMUSCULAR | Status: DC | PRN
  Administered 2013-06-21 – 2013-06-22 (×3): 2 mg via INTRAVENOUS
  Filled 2013-06-21 (×3): qty 1

## 2013-06-21 MED ORDER — HEPARIN (PORCINE) IN NACL 100-0.45 UNIT/ML-% IJ SOLN
12.0000 [IU]/kg/h | INTRAMUSCULAR | Status: DC
  Administered 2013-06-21: 12 [IU]/kg/h via INTRAVENOUS
  Filled 2013-06-21: qty 250

## 2013-06-21 MED ORDER — OXYCODONE-ACETAMINOPHEN 5-325 MG PO TABS: 1.0000 | ORAL_TABLET | ORAL | Status: DC | PRN

## 2013-06-21 MED ORDER — ONDANSETRON HCL 4 MG/2ML IJ SOLN
INTRAMUSCULAR | Status: AC
  Filled 2013-06-21: qty 2

## 2013-06-21 MED ORDER — SODIUM CHLORIDE 0.9 % IV SOLN
1000.0000 mL | INTRAVENOUS | Status: DC
  Administered 2013-06-21: 1000 mL via INTRAVENOUS

## 2013-06-21 MED ORDER — CARVEDILOL 6.25 MG PO TABS
6.2500 mg | ORAL_TABLET | Freq: Two times a day (BID) | ORAL | Status: DC
  Filled 2013-06-21 (×2): qty 1

## 2013-06-21 MED ORDER — ATORVASTATIN CALCIUM 80 MG PO TABS: 80.0000 mg | ORAL_TABLET | Freq: Every day | ORAL | Status: DC

## 2013-06-21 MED ORDER — HYDROMORPHONE HCL PF 1 MG/ML IJ SOLN
INTRAMUSCULAR | Status: AC
  Filled 2013-06-21: qty 1

## 2013-06-21 MED ORDER — FENOFIBRATE 160 MG PO TABS
160.0000 mg | ORAL_TABLET | Freq: Every day | ORAL | Status: DC
  Administered 2013-06-22 – 2013-06-24 (×3): 160 mg via ORAL
  Filled 2013-06-21 (×4): qty 1

## 2013-06-21 MED ORDER — HEPARIN SODIUM (PORCINE) 5000 UNIT/ML IJ SOLN
5000.0000 [IU] | Freq: Three times a day (TID) | INTRAMUSCULAR | Status: DC
  Administered 2013-06-22 – 2013-06-24 (×7): 5000 [IU] via SUBCUTANEOUS
  Filled 2013-06-21 (×10): qty 1

## 2013-06-21 MED ORDER — SODIUM CHLORIDE 0.9 % IV SOLN: 1.0000 mL/kg/h | INTRAVENOUS | Status: AC

## 2013-06-21 MED ORDER — HEPARIN BOLUS VIA INFUSION
4000.0000 [IU] | Freq: Once | INTRAVENOUS | Status: AC
  Administered 2013-06-21: 4000 [IU] via INTRAVENOUS

## 2013-06-21 MED ORDER — ACETAMINOPHEN 325 MG PO TABS
650.0000 mg | ORAL_TABLET | ORAL | Status: DC | PRN
  Administered 2013-06-22 – 2013-06-24 (×8): 650 mg via ORAL
  Filled 2013-06-21 (×8): qty 2

## 2013-06-21 MED ORDER — ONDANSETRON HCL 4 MG/2ML IJ SOLN: 4.0000 mg | Freq: Four times a day (QID) | INTRAMUSCULAR | Status: DC | PRN

## 2013-06-21 MED ORDER — SODIUM CHLORIDE 0.9 % IV BOLUS (SEPSIS)
250.0000 mL | Freq: Once | INTRAVENOUS | Status: AC
  Administered 2013-06-22: 250 mL via INTRAVENOUS

## 2013-06-21 MED ORDER — ASPIRIN 81 MG PO CHEW
81.0000 mg | CHEWABLE_TABLET | Freq: Every day | ORAL | Status: DC
  Administered 2013-06-22 – 2013-06-24 (×3): 81 mg via ORAL
  Filled 2013-06-21 (×3): qty 1

## 2013-06-21 MED ORDER — SODIUM CHLORIDE 0.9 % IV SOLN
1.7500 mg/kg/h | Freq: Once | INTRAVENOUS | Status: AC
  Administered 2013-06-21: 1.75 mg/kg/h via INTRAVENOUS
  Filled 2013-06-21: qty 250

## 2013-06-21 MED ORDER — HEPARIN (PORCINE) IN NACL 100-0.45 UNIT/ML-% IJ SOLN: 1300.0000 [IU]/h | INTRAMUSCULAR | Status: DC

## 2013-06-21 MED ORDER — INSULIN ASPART 100 UNIT/ML ~~LOC~~ SOLN
0.0000 [IU] | Freq: Every day | SUBCUTANEOUS | Status: DC
  Administered 2013-06-23: 2 [IU] via SUBCUTANEOUS

## 2013-06-21 MED ORDER — NITROGLYCERIN 0.2 MG/ML ON CALL CATH LAB
INTRAVENOUS | Status: AC
  Filled 2013-06-21: qty 1

## 2013-06-21 NOTE — ED Notes (Signed)
Pt c/o chest pain that started this evening with bilateral shoulder pain and arm pain.

## 2013-06-21 NOTE — H&P (Addendum)
Admission History and Physical  Patient ID: Tyler Garner, MRN: [id number], DOB: 05/25/1956 58 y.o. Date of Encounter: 06/21/2013, 9:30 PM  Primary Physician: Rudi HeapMOORE, DONALD, MD Primary Cardiologist: N/A  Chief Complaint: STEMI  History of Present Illness: Tyler Garner is a 58 y.o. male with a history of HTN, hyperlipidemia, DMII who presented to [facility] ED complaining of a constant, burning pain to the substernal region of the chest that radiates into the bilateral shoulders and into the arms with sudden onset about 2.5 hours prior to presentation. He reports associated, diffuse headache. He states that he had similar symptoms the day prior, but it was not as severe as today and resolved on its own.  He denies history of cardiac problems, MI, or stroke. He denies lightheadedness, weakness, abdominal pain. He reports a history of kidney stones about a month ago.  Patient reports that he quit smoking 4 years ago and is an occasional, social drinker.  Myagias to statin in the past, tolerating crestor.  In the ED his EKG showed inferior ST elevations with reciprocal changes laterally.  POC Troponin elevated. He was hemodynamically stable.  Code STEMI called and [facility].    Past Medical History  Diagnosis Date  . Hypertension   . Hyperglycemia   . Nicotine addiction   . Hyperlipidemia   . Renal colic   . Right shoulder injury 10/11/09  . Diabetes mellitus without complication      History reviewed. No pertinent past surgical history.   No current facility-administered medications on file prior to encounter.   Current Outpatient Prescriptions on File Prior to Encounter  Medication Sig Dispense Refill  . amLODipine (NORVASC) 10 MG tablet Take 1 tablet (10 mg total) by mouth daily.  30 tablet  5  . aspirin 81 MG tablet Take 81 mg by mouth daily.        . Choline Fenofibrate (TRILIPIX) 135 MG capsule Take 1 capsule (135 mg total) by mouth daily.  30  capsule  5  . glimepiride (AMARYL) 4 MG tablet Take 1 tablet (4 mg total) by mouth 2 (two) times daily.  60 tablet  5  . sitaGLIPtin-metformin (JANUMET) 50-1000 MG per tablet Take 1 tablet by mouth 2 (two) times daily with a meal.  60 tablet  5      Current Facility-Administered Medications  Medication Dose Route Frequency Provider Last Rate Last Dose  . 0.9 %  sodium chloride infusion  1,000 mL Intravenous Continuous Gerhard Munchobert Lockwood, MD 125 mL/hr at 06/21/13 1943 1,000 mL at 06/21/13 1943  . heparin ADULT infusion 100 units/mL (25000 units/250 mL)  1,300 Units/hr Intravenous Continuous Gerhard Munchobert Lockwood, MD          Allergies: No Known Allergies   Social History:  The patient  reports that he has quit smoking. He does not have any smokeless tobacco history on file. He reports that he drinks alcohol. He reports that he does not use illicit drugs.   Family History:  The patient's family history includes Diabetes in his brother and mother.   ROS:  Please see the history of present illness.   All other systems reviewed and negative.   Vital Signs: Blood pressure 156/85, pulse 96, temperature 98.3 F (36.8 C), resp. rate 18, height 5\' 10"  (1.778 m), weight 106.595 kg (235 lb), SpO2 98.00%.  PHYSICAL EXAM: General:  Well nourished, well developed, in no acute distress HEENT: normal Lymph: no adenopathy Neck: no JVD  Endocrine:  No thryomegaly Vascular: No carotid bruits; FA pulses 2+ bilaterally without bruits Cardiac:  normal S1, S2; RRR; no murmur Lungs:  clear to auscultation bilaterally, no wheezing, rhonchi or rales Abd: soft, nontender, no hepatomegaly Ext: no edema Musculoskeletal:  No deformities, BUE and BLE strength normal and equal Skin: warm and dry Neuro:  CNs 2-12 intact, no focal abnormalities noted Psych:  Normal affect   EKG:   NSR, Inferior ST elevation with reciprocal changes.  Labs:   Lab Results  Component Value Date   WBC 9.1 06/21/2013   HGB 15.6  06/21/2013   HCT 46.0 06/21/2013   MCV 82.3 06/21/2013   PLT 311 06/21/2013    Recent Labs Lab 06/21/13 1931 06/21/13 1942  NA 139 141  K 3.7 3.8  CL 97 101  CO2 26  --   BUN 15 15  CREATININE 0.90 0.90  CALCIUM 9.8  --   PROT 7.9  --   BILITOT 0.3  --   ALKPHOS 80  --   ALT 32  --   AST 34  --   GLUCOSE 203* 199*   No results found for this basename: CKTOTAL, CKMB, TROPONINI,  in the last 72 hours Lab Results  Component Value Date   CHOL 187 06/06/2013   LDLCALC 76 11/11/2012   TRIG 339* 11/11/2012   No results found for this basename: DDIMER    Lab Results  Component Value Date   HGBA1C 6.6 06/06/2013     Radiology/Studies:  Dg Chest Port 1 View  06/21/2013   CLINICAL DATA:  Chest pain, altered mental status, hypertension, diabetes  EXAM: PORTABLE CHEST - 1 VIEW  COMPARISON:  None.  FINDINGS: Cardiomegaly with vascular congestion. No CHF or pneumonia. Negative for effusion or pneumothorax. Trachea midline.  IMPRESSION: Cardiomegaly and vascular congestion.   Electronically Signed   By: Ruel Favors M.D.   On: 06/21/2013 19:48     ASSESSMENT AND PLAN:   1. ST-Elevation MI (STEMI) - 100% occlusion of large Left Dominant LCx following OM2.  EF 45% on LV gram. - Successful PCI with DES via R radial approach. - Loaded with Ticagralor, continue at 90mg  BID - Continue ASA 81 daily - Crestor (previous intolerance to different statins), BB, ACE-I as below  2. Hypertension - On amlodipine at home.  Hypotensive during PCI, likely  due to high sedation requirement.  will plan to transition him to ACE-I and BB given STEMI and Depressed EF.  3. Hyperlipidemia - Continue Crestor, Fenofibrate  4. DMII - HbA1C 6.6 earlier this month. - SSI  5. Concern for alcohol abuse - CIWA protocol  Signed,  Yaakov Guthrie, MD 06/21/2013, 9:30 PM  Addendum: - Patient with hypotension, requiring frequent IVF boluses.  Concern for RV involvement with inferior MI.  Hold BB, and continue  500cc bolus PRN with goal MAP > 70.  06/22/2013 12:47 AM   Addendum - Persistent hypotension after 2L NS.  JVP ~ 8cm with + HJR.   Mild 1-2/10 chest pain.  Will start low dose dopamine, continue fluid boluses.  Echo ordered for AM to evaluate RV function.

## 2013-06-21 NOTE — ED Notes (Signed)
Attempting to call report to cath lab, but there is no answer.

## 2013-06-21 NOTE — ED Provider Notes (Signed)
CSN: [id number]     Arrival date & time 06/21/13  1900 History  This chart was scribed for Gerhard Munch, MD by Dorothey Baseman, ED Scribe. This patient was seen in room APA04/APA04 and the patient's care was started at 7:14 PM.    Chief Complaint  Patient presents with  . Chest Pain   The history is provided by the patient. No language interpreter was used.   HPI Comments: Tyler Garner is a 58 y.o. Male with a history of HTN, hyperlipidemia who presents to the Emergency Department complaining of a constant, burning pain to the substernal region of the chest that radiates into the bilateral shoulders and into the arms with sudden onset about 2.5 hours ago. He reports associated, diffuse headache. He states that he had similar symptoms yesterday, but it was not as severe as today and resolved on its own. He states that he does a lot of heavy lifting at his job. Patient states that he has not had a cardiac evaluation in 7-8 years, but that it was normal. He denies history of cardiac problems, MI, or stroke. He denies lightheadedness, weakness, abdominal pain. He reports a history of kidney stones about a month ago. Patient also has a history of DM. Patient reports that he quit smoking 4 years ago and is an occasional, social drinker.   Past Medical History  Diagnosis Date  . Hypertension   . Hyperglycemia   . Nicotine addiction   . Hyperlipidemia   . Renal colic   . Right shoulder injury 10/11/09  . Diabetes mellitus without complication    No past surgical history on file. Family History  Problem Relation Age of Onset  . Diabetes Mother   . Diabetes Brother    History  Substance Use Topics  . Smoking status: Former Games developer  . Smokeless tobacco: Not on file  . Alcohol Use: Yes    Review of Systems  Constitutional:       Per HPI, otherwise negative  HENT:       Per HPI, otherwise negative  Respiratory:       Per HPI, otherwise negative  Cardiovascular: Positive for chest pain.        Per HPI, otherwise negative  Gastrointestinal: Negative for vomiting and abdominal pain.  Endocrine:       Negative aside from HPI  Genitourinary:       Neg aside from HPI   Musculoskeletal:       Per HPI, otherwise negative  Skin: Negative.   Neurological: Positive for headaches. Negative for syncope and light-headedness.    Allergies  Review of patient's allergies indicates no known allergies.  Home Medications   Current Outpatient Rx  Name  Route  Sig  Dispense  Refill  . amLODipine (NORVASC) 10 MG tablet   Oral   Take 1 tablet (10 mg total) by mouth daily.   30 tablet   5   . aspirin 81 MG tablet   Oral   Take 81 mg by mouth daily.           . Choline Fenofibrate (TRILIPIX) 135 MG capsule   Oral   Take 1 capsule (135 mg total) by mouth daily.   30 capsule   5   . glimepiride (AMARYL) 4 MG tablet   Oral   Take 1 tablet (4 mg total) by mouth 2 (two) times daily.   60 tablet   5   . rosuvastatin (CRESTOR) 20 MG tablet   Oral  Take 1 tablet (20 mg total) by mouth daily.   30 tablet   5   . sitaGLIPtin-metformin (JANUMET) 50-1000 MG per tablet   Oral   Take 1 tablet by mouth 2 (two) times daily with a meal.   60 tablet   5    Triage Vitals: BP 156/85  Pulse 96  Temp(Src) 98.3 F (36.8 C)  Resp 18  Ht 5\' 10"  (1.778 m)  Wt 235 lb (106.595 kg)  BMI 33.72 kg/m2  SpO2 98%  Physical Exam  Nursing note and vitals reviewed. Constitutional: He is oriented to person, place, and time. He appears well-developed. No distress.  HENT:  Head: Normocephalic and atraumatic.  Eyes: Conjunctivae and EOM are normal.  Cardiovascular: Normal rate and regular rhythm.   Pulmonary/Chest: Effort normal. No stridor. No respiratory distress. He exhibits no tenderness.  No reproducible tenderness to the chest wall.   Abdominal: Soft. Bowel sounds are normal. He exhibits no distension. There is no tenderness. There is no rebound and no guarding.  Musculoskeletal:  He exhibits no edema.  Neurological: He is alert and oriented to person, place, and time.  Skin: Skin is warm and dry.  Psychiatric: He has a normal mood and affect.    ED Course  Procedures (including critical care time)  COORDINATION OF CARE: 7:19 PM- Ordered an EKG. Will order blood labs, UA, and a chest x-ray. Discussed treatment plan with patient at bedside and patient verbalized agreement.     7:25 PM- Elevations in inferior leads and depressions in the lateral leads noted in the EKG. Will call a code STEMI. Discussed this with the patient. Will order IV fluids and medication to manage symptoms. Discussed that the patient will likely need to be admitted to the hospital for further evaluation. Discussed treatment plan with patient at bedside and patient verbalized agreement.   Update: Heparin, fluids ordered.  Update: I discussed patient's case with our transfer team, with our cardiology team.  Patient is being prepared for transfer. Labs Review Labs Reviewed - No data to display Imaging Review No results found.   Date: 06/21/2013  Rate: 88  Rhythm: normal sinus rhythm  QRS Axis: normal  Intervals: normal  ST/T Wave abnormalities: ST elevations inferiorly and ST depressions laterally  Conduction Disutrbances:none   Narrative Interpretation: STEMI   Old EKG Reviewed: none available    MDM   1. STEMI (ST elevation myocardial infarction)     I personally performed the services described in this documentation, which was scribed in my presence. The recorded information has been reviewed and is accurate.  Patient presents with new chest pain.  On initial exam they continue to have pain.  Patient's vital signs are stable, but his story, EKG, elevated troponin will suggest ST elevation MI.  After initial evaluation for initiation of heparin I discussed with her cardiologist.  Patient was transferred to our cardiac center for percutaneous intervention.  CRITICAL  CARE Performed by: Gerhard Munch Total critical care time:35 Critical care time was exclusive of separately billable procedures and treating other patients. Critical care was necessary to treat or prevent imminent or life-threatening deterioration. Critical care was time spent personally by me on the following activities: development of treatment plan with patient and/or surrogate as well as nursing, discussions with consultants, evaluation of patient's response to treatment, examination of patient, obtaining history from patient or surrogate, ordering and performing treatments and interventions, ordering and review of laboratory studies, ordering and review of radiographic studies, pulse oximetry and  re-evaluation of patient's condition.      Gerhard Munchobert Deane Wattenbarger, MD 06/21/13 [phone]

## 2013-06-21 NOTE — CV Procedure (Signed)
    Cardiac Catheterization Procedure Note  Name: Tyler Garner MRN: [id number] DOB: [birth year]  Procedure: Left Heart Cath, Selective Coronary Angiography, LV angiography, PTCA and stenting of the distal LCx  Indication: 58 yo WM with history of HTN, Hyperlipidemia, DM presents in transfer from [facility] with acute inferior STEMI.  Procedural Details:  The right wrist was prepped, draped, and anesthetized with 1% lidocaine. Using the modified Seldinger technique, a 6 French sheath was introduced into the right radial artery. 3 mg of verapamil was administered through the sheath, weight-based unfractionated heparin was administered intravenously. Standard Judkins catheters were used for selective coronary angiography and left ventriculography. Catheter exchanges were performed over an exchange length guidewire.  PROCEDURAL FINDINGS Hemodynamics: AO 82/47 mean 60 mm Hg LV 89/14 mm Hg   Coronary angiography: Coronary dominance: left-  Left mainstem: Normal  Left anterior descending (LAD):  Mild wall irregularities  Left circumflex (LCx): Large dominant vessel. There are mild irregularities in the proximal and mid vessel. The LCx is occluded distally following the second OM.  Right coronary artery (RCA): small, nondominant. Normal.  Left ventriculography: Left ventricular systolic function is abnormal, LVEF is estimated at 40-45%, there is inferior hypokinesis, there is no significant mitral regurgitation   PCI Note:  Following the diagnostic procedure, the decision was made to proceed with PCI. Brilinta 180 mg was given orally. Weight-based bivalirudin was given for anticoagulation. Once a therapeutic ACT was achieved, a 6 [place] XBLAD guide catheter was inserted.  The lesion was very difficult to cross with the wire. Unable to cross with a prowater wire. A Choice PT moderate support coronary guidewire was used to cross the lesion. The wire selected the more distal branch/PDA.  There was another PLOM branch that bifurcated from this branch after the occlusion.   The lesion was predilated with a 2.25 mm balloon.  The lesion was then stented with a 2.5 x 24 mm Promus stent covering the first PLOM branch.   Following PCI, there was 0% residual stenosis and TIMI-3 flow down the PDA and PLOM branches. Final angiography confirmed an excellent result. The patient tolerated the procedure well. There were no immediate procedural complications. A TR band was used for radial hemostasis. The patient was transferred to the post catheterization recovery area for further monitoring.  PCI Data: Vessel - Dominant LCx/Segment - distal Percent Stenosis (pre)  100% TIMI-flow 0 Stent 2.5 x 24 mm Promus stent. Percent Stenosis (post) 0% TIMI-flow (post) 3  Final Conclusions:   1. Single vessel obstructive CAD 2. Mild to moderate LV dysfunction. 3. Successful stenting of the distal LCx with reperfusion of the PDA and PLOM branches   Recommendations:  Dual antiplatelet therapy for one year.   Theron Arista [facility] 06/21/2013, 9:36 PM

## 2013-06-21 NOTE — ED Notes (Signed)
[facility] called for [facility] Stemi.  Also called [facility] for a truck to transport to [facility], per [facility] request.

## 2013-06-22 DIAGNOSIS — I10 Essential (primary) hypertension: Secondary | ICD-10-CM

## 2013-06-22 DIAGNOSIS — E119 Type 2 diabetes mellitus without complications: Secondary | ICD-10-CM

## 2013-06-22 DIAGNOSIS — E785 Hyperlipidemia, unspecified: Secondary | ICD-10-CM

## 2013-06-22 DIAGNOSIS — I219 Acute myocardial infarction, unspecified: Secondary | ICD-10-CM

## 2013-06-22 DIAGNOSIS — I517 Cardiomegaly: Secondary | ICD-10-CM

## 2013-06-22 LAB — GLUCOSE, CAPILLARY
GLUCOSE-CAPILLARY: 168 mg/dL — AB (ref 70–99)
GLUCOSE-CAPILLARY: 233 mg/dL — AB (ref 70–99)
GLUCOSE-CAPILLARY: 184 mg/dL — AB (ref 70–99)
Glucose-Capillary: 238 mg/dL — ABNORMAL HIGH (ref 70–99)
Glucose-Capillary: 166 mg/dL — ABNORMAL HIGH (ref 70–99)

## 2013-06-22 LAB — BASIC METABOLIC PANEL
BUN: 13 mg/dL (ref 6–23)
CO2: 22 meq/L (ref 19–32)
Calcium: 8.2 mg/dL — ABNORMAL LOW (ref 8.4–10.5)
Chloride: 102 mEq/L (ref 96–112)
Creatinine, Ser: 0.84 mg/dL (ref 0.50–1.35)
GFR calc Af Amer: >90 mL/min (ref >90)
GFR calc non Af Amer: >90 mL/min (ref >90)
Glucose, Bld: 228 mg/dL — ABNORMAL HIGH (ref 70–99)
POTASSIUM: 4.6 meq/L (ref 3.7–5.3)
Sodium: 139 mEq/L (ref 137–147)

## 2013-06-22 LAB — POCT ACTIVATED CLOTTING TIME: Activated Clotting Time: 370 seconds

## 2013-06-22 LAB — MRSA PCR SCREENING: MRSA by PCR: NEGATIVE

## 2013-06-22 LAB — CBC
HCT: 35.1 % — ABNORMAL LOW (ref 39.0–52.0)
Hemoglobin: 12 g/dL — ABNORMAL LOW (ref 13.0–17.0)
MCH: 28.4 pg (ref 26.0–34.0)
MCHC: 34.2 g/dL (ref 30.0–36.0)
MCV: 83 fL (ref 78.0–100.0)
Platelets: 242 10*3/uL (ref 150–400)
RBC: 4.23 MIL/uL (ref 4.22–5.81)
RDW: 14.3 % (ref 11.5–15.5)
WBC: 9.7 10*3/uL (ref 4.0–10.5)

## 2013-06-22 MED ORDER — PERFLUTREN LIPID MICROSPHERE
INTRAVENOUS | Status: AC
  Administered 2013-06-22: 3 mL
  Filled 2013-06-22: qty 10

## 2013-06-22 MED ORDER — SODIUM CHLORIDE 0.9 % IV BOLUS (SEPSIS): 500.0000 mL | INTRAVENOUS | Status: DC | PRN

## 2013-06-22 MED ORDER — SODIUM CHLORIDE 0.9 % IV BOLUS (SEPSIS)
500.0000 mL | Freq: Once | INTRAVENOUS | Status: AC
  Administered 2013-06-22: 500 mL via INTRAVENOUS

## 2013-06-22 MED ORDER — MORPHINE SULFATE 2 MG/ML IJ SOLN
2.0000 mg | INTRAMUSCULAR | Status: DC | PRN
  Administered 2013-06-22 (×4): 2 mg via INTRAVENOUS
  Filled 2013-06-22 (×3): qty 1

## 2013-06-22 MED ORDER — PERFLUTREN LIPID MICROSPHERE: 1.0000 mL | INTRAVENOUS | Status: AC | PRN

## 2013-06-22 MED ORDER — DOPAMINE-DEXTROSE 3.2-5 MG/ML-% IV SOLN: 1.0000 ug/kg/min | INTRAVENOUS | Status: DC

## 2013-06-22 MED FILL — Sodium Chloride IV Soln 0.9%: INTRAVENOUS | Qty: 50 | Status: AC

## 2013-06-22 NOTE — Care Management Note (Addendum)
    Page 1 of 1   06/22/2013     11:20:34 AM   CARE MANAGEMENT NOTE 06/22/2013  Patient:  Tyler Garner, Tyler Garner   Account Number:  [id number]  Date Initiated:  06/22/2013  Documentation initiated by:  Tyler Garner  Subjective/Objective Assessment:   adm w stemi     Action/Plan:   lives w wife, pcp dr don Christell Constant   Anticipated DC Date:     Anticipated DC Plan:        DC Planning Services  CM consult  Medication Assistance      Choice offered to / List presented to:             Status of service:   Medicare Important Message given?   (If response is "NO", the following Medicare IM given date fields will be blank) Date Medicare IM given:   Date Additional Medicare IM given:    Discharge Disposition:    Per UR Regulation:  Reviewed for med. necessity/level of care/duration of stay  If discussed at Long Length of Stay Meetings, dates discussed:    Comments:  1/29 1033a Tyler [name] rn,bsn gave pt 30day free and copay assist card for brilinta. pt will have 20% of cost of brilinta out of pocket.

## 2013-06-22 NOTE — Progress Notes (Signed)
Began discussing MI, stent, Brilinta. Pt receptive and appreciated diet to read. Will f/u tomorrow for more ed and ambulation.  [phone] Ethelda Chick CES, ACSM 3:15 PM 06/22/2013

## 2013-06-22 NOTE — Progress Notes (Signed)
Pt still having cp bet 2-4 even after morphine w/ bp in the 80s notified Dr Brooke Pace.

## 2013-06-22 NOTE — Progress Notes (Signed)
Inpatient Diabetes Program Recommendations  AACE/ADA: New Consensus Statement on Inpatient Glycemic Control (2013)  Target Ranges:  Prepandial:   less than 140 mg/dL      Peak postprandial:   less than 180 mg/dL (1-2 hours)      Critically ill patients:  140 - 180 mg/dL  Results for Tyler Garner, Tyler Garner (MRN [id number]) as of 06/22/2013 09:13  Ref. Range 06/21/2013 22:24 06/22/2013 07:32  Glucose-Capillary Latest Range: 70-99 mg/dL 453 (H) 646 (H)   Inpatient Diabetes Program Recommendations Insulin - Basal: may need low dose Lantus temporarily  Diet: add carbohydrate modified to current heart healthy diet  Thank you  Piedad Climes BSN, RN,CDE Inpatient Diabetes Coordinator [phone] (team pager)

## 2013-06-22 NOTE — Progress Notes (Signed)
Echocardiogram 2D Echocardiogram with Definity has been performed.  Tyler Garner 06/22/2013, 10:14 AM

## 2013-06-22 NOTE — Progress Notes (Signed)
DAILY PROGRESS NOTE  Subjective:  No events noted overnight. Still c/o 2-3/10 burning chest pain. Question of RV involvement - he was hypotensive overnight and the affected vessel was a distal dominant LCx.  For echocardiogram today.  Objective:  Temp:  [98 F (36.7 C)-98.3 F (36.8 C)] 98.3 F (36.8 C) (01/29 0729) Pulse Rate:  [57-128] 79 (01/29 0729) Resp:  [14-31] 21 (01/29 0729) BP: (80-156)/(46-87) 91/48 mmHg (01/29 0700) SpO2:  [95 %-100 %] 99 % (01/29 0729) Weight:  [235 lb (106.595 kg)-235 lb 0.2 oz (106.6 kg)] 235 lb 0.2 oz (106.6 kg) (01/28 2158) Weight change:   Intake/Output from previous day: 01/28 0701 - 01/29 0700 In: 3134 [P.O.:400; I.V.:434; IV Piggyback:2300] Out: 1000 [Urine:1000]  Intake/Output from this shift:    Medications: Current Facility-Administered Medications  Medication Dose Route Frequency Provider Last Rate Last Dose  . acetaminophen (TYLENOL) tablet 650 mg  650 mg Oral Q4H PRN Peter M Martinique, MD   650 mg at 06/22/13 0038  . aspirin chewable tablet 81 mg  81 mg Oral Daily Peter M Martinique, MD      . DOPamine (INTROPIN) 800 mg in dextrose 5 % 250 mL infusion  1-20 mcg/kg/min Intravenous Titrated Stephani Police, MD      . fenofibrate tablet 160 mg  160 mg Oral Daily Peter M Martinique, MD      . heparin injection 5,000 Units  5,000 Units Subcutaneous Q8H Peter M Martinique, MD   5,000 Units at 06/22/13 0557  . insulin aspart (novoLOG) injection 0-15 Units  0-15 Units Subcutaneous TID WC Stephani Police, MD   5 Units at 06/22/13 [phone]  . insulin aspart (novoLOG) injection 0-5 Units  0-5 Units Subcutaneous QHS Stephani Police, MD      . morphine 2 MG/ML injection 2 mg  2 mg Intravenous Q1H PRN Peter M Martinique, MD   2 mg at 06/22/13 0601  . ondansetron (ZOFRAN) injection 4 mg  4 mg Intravenous Q6H PRN Peter M Martinique, MD      . rosuvastatin (CRESTOR) tablet 20 mg  20 mg Oral QHS Peter M Martinique, MD      . sodium chloride 0.9 % bolus 500 mL  500 mL Intravenous PRN  Stephani Police, MD      . Ticagrelor [facility]) tablet 90 mg  90 mg Oral BID Peter M Martinique, MD        Physical Exam: General appearance: alert and no distress Neck: no carotid bruit and no JVD Lungs: clear to auscultation bilaterally Heart: regular rate and rhythm, S1, S2 normal, no murmur, click, rub or gallop Abdomen: soft, non-tender; bowel sounds normal; no masses,  no organomegaly Extremities: extremities normal, atraumatic, no cyanosis or edema Pulses: 2+ and symmetric Skin: Skin color, texture, turgor normal. No rashes or lesions Neurologic: Grossly normal Psych: Pleasant  Lab Results: Results for orders placed during the hospital encounter of 06/21/13 (from the past 48 hour(s))  CBC     Status: None   Collection Time    06/21/13  7:31 PM      Result Value Range   WBC 9.1  4.0 - 10.5 K/uL   RBC 5.02  4.22 - 5.81 MIL/uL   Hemoglobin 13.8  13.0 - 17.0 g/dL   HCT 41.3  39.0 - 52.0 %   MCV 82.3  78.0 - 100.0 fL   MCH 27.5  26.0 - 34.0 pg   MCHC 33.4  30.0 - 36.0 g/dL   RDW 14.0  11.5 -  15.5 %   Platelets 311  150 - 400 K/uL  COMPREHENSIVE METABOLIC PANEL     Status: Abnormal   Collection Time    06/21/13  7:31 PM      Result Value Range   Sodium 139  137 - 147 mEq/L   Potassium 3.7  3.7 - 5.3 mEq/L   Chloride 97  96 - 112 mEq/L   CO2 26  19 - 32 mEq/L   Glucose, Bld 203 (*) 70 - 99 mg/dL   BUN 15  6 - 23 mg/dL   Creatinine, Ser 0.90  0.50 - 1.35 mg/dL   Calcium 9.8  8.4 - 10.5 mg/dL   Total Protein 7.9  6.0 - 8.3 g/dL   Albumin 4.7  3.5 - 5.2 g/dL   AST 34  0 - 37 U/L   ALT 32  0 - 53 U/L   Alkaline Phosphatase 80  39 - 117 U/L   Total Bilirubin 0.3  0.3 - 1.2 mg/dL   GFR calc non Af Amer >90  >90 mL/min   GFR calc Af Amer >90  >90 mL/min   Comment: (NOTE)     The eGFR has been calculated using the CKD EPI equation.     This calculation has not been validated in all clinical situations.     eGFR's persistently <90 mL/min signify possible Chronic Kidney      Disease.  PROTIME-INR     Status: None   Collection Time    06/21/13  7:31 PM      Result Value Range   Prothrombin Time 13.4  11.6 - 15.2 seconds   INR 1.04  0.00 - 1.49  POCT I-STAT TROPONIN I     Status: Abnormal   Collection Time    06/21/13  7:38 PM      Result Value Range   Troponin i, poc 0.18 (*) 0.00 - 0.08 ng/mL   Comment NOTIFIED PHYSICIAN     Comment 3            Comment: Due to the release kinetics of cTnI,     a negative result within the first hours     of the onset of symptoms does not rule out     myocardial infarction with certainty.     If myocardial infarction is still suspected,     repeat the test at appropriate intervals.  POCT I-STAT, CHEM 8     Status: Abnormal   Collection Time    06/21/13  7:42 PM      Result Value Range   Sodium 141  137 - 147 mEq/L   Potassium 3.8  3.7 - 5.3 mEq/L   Chloride 101  96 - 112 mEq/L   BUN 15  6 - 23 mg/dL   Creatinine, Ser 0.90  0.50 - 1.35 mg/dL   Glucose, Bld 199 (*) 70 - 99 mg/dL   Calcium, Ion 1.18  1.12 - 1.23 mmol/L   TCO2 25  0 - 100 mmol/L   Hemoglobin 15.6  13.0 - 17.0 g/dL   HCT 46.0  39.0 - 52.0 %  MRSA PCR SCREENING     Status: None   Collection Time    06/21/13 10:00 PM      Result Value Range   MRSA by PCR NEGATIVE  NEGATIVE   Comment:            The GeneXpert MRSA Assay (FDA     approved for NASAL specimens  only), is one component of a     comprehensive MRSA colonization     surveillance program. It is not     intended to diagnose MRSA     infection nor to guide or     monitor treatment for     MRSA infections.  GLUCOSE, CAPILLARY     Status: Abnormal   Collection Time    06/21/13 10:24 PM      Result Value Range   Glucose-Capillary 168 (*) 70 - 99 mg/dL  CBC     Status: Abnormal   Collection Time    06/21/13 10:44 PM      Result Value Range   WBC 9.2  4.0 - 10.5 K/uL   RBC 4.76  4.22 - 5.81 MIL/uL   Hemoglobin 13.3  13.0 - 17.0 g/dL   HCT 38.9 (*) 39.0 - 52.0 %   MCV 81.7  78.0  - 100.0 fL   MCH 27.9  26.0 - 34.0 pg   MCHC 34.2  30.0 - 36.0 g/dL   RDW 14.0  11.5 - 15.5 %   Platelets 287  150 - 400 K/uL  CREATININE, SERUM     Status: None   Collection Time    06/21/13 10:44 PM      Result Value Range   Creatinine, Ser 0.81  0.50 - 1.35 mg/dL   GFR calc non Af Amer >90  >90 mL/min   GFR calc Af Amer >90  >90 mL/min   Comment: (NOTE)     The eGFR has been calculated using the CKD EPI equation.     This calculation has not been validated in all clinical situations.     eGFR's persistently <90 mL/min signify possible Chronic Kidney     Disease.  CBC     Status: Abnormal   Collection Time    06/22/13  2:15 AM      Result Value Range   WBC 9.7  4.0 - 10.5 K/uL   RBC 4.23  4.22 - 5.81 MIL/uL   Hemoglobin 12.0 (*) 13.0 - 17.0 g/dL   HCT 35.1 (*) 39.0 - 52.0 %   MCV 83.0  78.0 - 100.0 fL   MCH 28.4  26.0 - 34.0 pg   MCHC 34.2  30.0 - 36.0 g/dL   RDW 14.3  11.5 - 15.5 %   Platelets 242  150 - 400 K/uL  BASIC METABOLIC PANEL     Status: Abnormal   Collection Time    06/22/13  2:15 AM      Result Value Range   Sodium 139  137 - 147 mEq/L   Potassium 4.6  3.7 - 5.3 mEq/L   Chloride 102  96 - 112 mEq/L   CO2 22  19 - 32 mEq/L   Glucose, Bld 228 (*) 70 - 99 mg/dL   BUN 13  6 - 23 mg/dL   Creatinine, Ser 0.84  0.50 - 1.35 mg/dL   Calcium 8.2 (*) 8.4 - 10.5 mg/dL   GFR calc non Af Amer >90  >90 mL/min   GFR calc Af Amer >90  >90 mL/min   Comment: (NOTE)     The eGFR has been calculated using the CKD EPI equation.     This calculation has not been validated in all clinical situations.     eGFR's persistently <90 mL/min signify possible Chronic Kidney     Disease.  GLUCOSE, CAPILLARY     Status: Abnormal   Collection Time  06/22/13  7:32 AM      Result Value Range   Glucose-Capillary 233 (*) 70 - 99 mg/dL    Imaging: Dg Chest Port 1 View  06/21/2013   CLINICAL DATA:  Chest pain, altered mental status, hypertension, diabetes  EXAM: PORTABLE CHEST - 1  VIEW  COMPARISON:  None.  FINDINGS: Cardiomegaly with vascular congestion. No CHF or pneumonia. Negative for effusion or pneumothorax. Trachea midline.  IMPRESSION: Cardiomegaly and vascular congestion.   Electronically Signed   By: Daryll Brod M.D.   On: 06/21/2013 19:48   EKG: Sinus rhythm with resolution of the previously noted inferior ST elevation  Assessment:  Principal Problem:   ST elevation myocardial infarction (STEMI) of inferior wall Active Problems:   HTN (hypertension)   Hyperlipidemia   DM (diabetes mellitus)   Plan:  Mr. Hoogland is feeling better today, but did have a short-run of 5 beat NSVT overnight, suspect due to reperfusion. He c/o 2-3/10 chest pain. BP remains soft. I would continue to avoid nitrates. CVP by exam is estimated to be about 5. Will check 2D echo today, focus on IVC, RV and wall motion inferiorly. Continue ASA, Brillinta, and Crestor. BP will not allow b-blocker or ACE-I/ARB at this time. Insulin SS for diabetes. Up in room today, but would wait for cardiac rehab until tomorrow due to hypotension.  Decrease morphine frequency to q4hrs (he has been getting it every hour).  Time Spent Directly with Patient:  20 minutes  Length of Stay:  LOS: 1 day   Pixie Casino, MD, [facility] Attending Cardiologist [facility]  Jaclene Bartelt C 06/22/2013, 8:18 AM

## 2013-06-23 DIAGNOSIS — I2119 ST elevation (STEMI) myocardial infarction involving other coronary artery of inferior wall: Secondary | ICD-10-CM

## 2013-06-23 DIAGNOSIS — I219 Acute myocardial infarction, unspecified: Secondary | ICD-10-CM | POA: Diagnosis present

## 2013-06-23 DIAGNOSIS — I2129 ST elevation (STEMI) myocardial infarction involving other sites: Secondary | ICD-10-CM

## 2013-06-23 LAB — CBC
HCT: 36.1 % — ABNORMAL LOW (ref 39.0–52.0)
HEMOGLOBIN: 11.9 g/dL — AB (ref 13.0–17.0)
MCH: 27.5 pg (ref 26.0–34.0)
MCHC: 33 g/dL (ref 30.0–36.0)
MCV: 83.4 fL (ref 78.0–100.0)
Platelets: 238 10*3/uL (ref 150–400)
RBC: 4.33 MIL/uL (ref 4.22–5.81)
RDW: 14.3 % (ref 11.5–15.5)
WBC: 10.1 10*3/uL (ref 4.0–10.5)

## 2013-06-23 LAB — GLUCOSE, CAPILLARY
GLUCOSE-CAPILLARY: 181 mg/dL — AB (ref 70–99)
GLUCOSE-CAPILLARY: 148 mg/dL — AB (ref 70–99)
GLUCOSE-CAPILLARY: 141 mg/dL — AB (ref 70–99)
GLUCOSE-CAPILLARY: 222 mg/dL — AB (ref 70–99)

## 2013-06-23 LAB — BASIC METABOLIC PANEL
BUN: 13 mg/dL (ref 6–23)
CALCIUM: 9.2 mg/dL (ref 8.4–10.5)
CO2: 27 mEq/L (ref 19–32)
Chloride: 103 mEq/L (ref 96–112)
Creatinine, Ser: 0.82 mg/dL (ref 0.50–1.35)
GFR calc non Af Amer: >90 mL/min (ref >90)
Glucose, Bld: 173 mg/dL — ABNORMAL HIGH (ref 70–99)
POTASSIUM: 4.6 meq/L (ref 3.7–5.3)
Sodium: 143 mEq/L (ref 137–147)

## 2013-06-23 LAB — LIPID PANEL
Cholesterol: 150 mg/dL (ref 0–200)
HDL: 22 mg/dL — ABNORMAL LOW (ref >39)
LDL CALC: 84 mg/dL (ref 0–99)
TRIGLYCERIDES: 222 mg/dL — AB (ref <150)
Total CHOL/HDL Ratio: 6.8 RATIO
VLDL: 44 mg/dL — AB (ref 0–40)

## 2013-06-23 LAB — PRO B NATRIURETIC PEPTIDE: PRO B NATRI PEPTIDE: 537.1 pg/mL — AB (ref 0–125)

## 2013-06-23 MED ORDER — METOPROLOL TARTRATE 12.5 MG HALF TABLET
12.5000 mg | ORAL_TABLET | Freq: Two times a day (BID) | ORAL | Status: DC
  Administered 2013-06-23 – 2013-06-24 (×3): 12.5 mg via ORAL
  Filled 2013-06-23 (×4): qty 1

## 2013-06-23 NOTE — Progress Notes (Signed)
DAILY PROGRESS NOTE  Subjective:  NSVT noted overnight - including a run of approximately 15-20 beats. He was unaware of this. Says he "feels much better" today.  Blood pressure has improved. Echo yesterday shows EF of 45-50%, lateral wall motion abnormality and the RV is dilated and moderately hypokinetic, confirming my suspicion of RV infarct from a dominant LCX lesion.  Objective:  Temp:  [97.8 F (36.6 C)-98.7 F (37.1 C)] 98.7 F (37.1 C) (01/30 0800) Pulse Rate:  [58-87] 61 (01/29 1600) Resp:  [0-27] 14 (01/30 0800) BP: (87-140)/(36-74) 119/63 mmHg (01/30 0800) SpO2:  [92 %-99 %] 98 % (01/30 0800) Weight change:   Intake/Output from previous day: 01/29 0701 - 01/30 0700 In: -  Out: 2100 [Urine:2100]  Intake/Output from this shift:    Medications: Current Facility-Administered Medications  Medication Dose Route Frequency Provider Last Rate Last Dose  . acetaminophen (TYLENOL) tablet 650 mg  650 mg Oral Q4H PRN Peter M Martinique, MD   650 mg at 06/23/13 0907  . aspirin chewable tablet 81 mg  81 mg Oral Daily Peter M Martinique, MD   81 mg at 06/23/13 3338  . DOPamine (INTROPIN) 800 mg in dextrose 5 % 250 mL infusion  1-20 mcg/kg/min Intravenous Titrated Stephani Police, MD      . fenofibrate tablet 160 mg  160 mg Oral Daily Peter M Martinique, MD   160 mg at 06/23/13 0906  . heparin injection 5,000 Units  5,000 Units Subcutaneous Q8H Peter M Martinique, MD   5,000 Units at 06/23/13 0531  . insulin aspart (novoLOG) injection 0-15 Units  0-15 Units Subcutaneous TID WC Stephani Police, MD   3 Units at 06/23/13 [id number]  . insulin aspart (novoLOG) injection 0-5 Units  0-5 Units Subcutaneous QHS Stephani Police, MD      . morphine 2 MG/ML injection 2 mg  2 mg Intravenous Q4H PRN Pixie Casino, MD   2 mg at 06/22/13 2149  . ondansetron (ZOFRAN) injection 4 mg  4 mg Intravenous Q6H PRN Peter M Martinique, MD      . rosuvastatin (CRESTOR) tablet 20 mg  20 mg Oral QHS Peter M Martinique, MD   20 mg at 06/22/13  2150  . sodium chloride 0.9 % bolus 500 mL  500 mL Intravenous PRN Stephani Police, MD      . Ticagrelor [facility]) tablet 90 mg  90 mg Oral BID Peter M Martinique, MD   90 mg at 06/23/13 0906    Physical Exam: General appearance: alert and no distress Neck: no carotid bruit and no JVD Lungs: clear to auscultation bilaterally Heart: regular rate and rhythm, S1, S2 normal, no murmur, click, rub or gallop Abdomen: soft, non-tender; bowel sounds normal; no masses,  no organomegaly Extremities: extremities normal, atraumatic, no cyanosis or edema Pulses: 2+ and symmetric Skin: Skin color, texture, turgor normal. No rashes or lesions Neurologic: Grossly normal Psych: Pleasant  Lab Results: Results for orders placed during the hospital encounter of 06/21/13 (from the past 48 hour(s))  CBC     Status: None   Collection Time    06/21/13  7:31 PM      Result Value Range   WBC 9.1  4.0 - 10.5 K/uL   RBC 5.02  4.22 - 5.81 MIL/uL   Hemoglobin 13.8  13.0 - 17.0 g/dL   HCT 41.3  39.0 - 52.0 %   MCV 82.3  78.0 - 100.0 fL   MCH 27.5  26.0 - 34.0 pg  MCHC 33.4  30.0 - 36.0 g/dL   RDW 14.0  11.5 - 15.5 %   Platelets 311  150 - 400 K/uL  COMPREHENSIVE METABOLIC PANEL     Status: Abnormal   Collection Time    06/21/13  7:31 PM      Result Value Range   Sodium 139  137 - 147 mEq/L   Potassium 3.7  3.7 - 5.3 mEq/L   Chloride 97  96 - 112 mEq/L   CO2 26  19 - 32 mEq/L   Glucose, Bld 203 (*) 70 - 99 mg/dL   BUN 15  6 - 23 mg/dL   Creatinine, Ser 0.90  0.50 - 1.35 mg/dL   Calcium 9.8  8.4 - 10.5 mg/dL   Total Protein 7.9  6.0 - 8.3 g/dL   Albumin 4.7  3.5 - 5.2 g/dL   AST 34  0 - 37 U/L   ALT 32  0 - 53 U/L   Alkaline Phosphatase 80  39 - 117 U/L   Total Bilirubin 0.3  0.3 - 1.2 mg/dL   GFR calc non Af Amer >90  >90 mL/min   GFR calc Af Amer >90  >90 mL/min   Comment: (NOTE)     The eGFR has been calculated using the CKD EPI equation.     This calculation has not been validated in all clinical  situations.     eGFR's persistently <90 mL/min signify possible Chronic Kidney     Disease.  PROTIME-INR     Status: None   Collection Time    06/21/13  7:31 PM      Result Value Range   Prothrombin Time 13.4  11.6 - 15.2 seconds   INR 1.04  0.00 - 1.49  POCT I-STAT TROPONIN I     Status: Abnormal   Collection Time    06/21/13  7:38 PM      Result Value Range   Troponin i, poc 0.18 (*) 0.00 - 0.08 ng/mL   Comment NOTIFIED PHYSICIAN     Comment 3            Comment: Due to the release kinetics of cTnI,     a negative result within the first hours     of the onset of symptoms does not rule out     myocardial infarction with certainty.     If myocardial infarction is still suspected,     repeat the test at appropriate intervals.  POCT I-STAT, CHEM 8     Status: Abnormal   Collection Time    06/21/13  7:42 PM      Result Value Range   Sodium 141  137 - 147 mEq/L   Potassium 3.8  3.7 - 5.3 mEq/L   Chloride 101  96 - 112 mEq/L   BUN 15  6 - 23 mg/dL   Creatinine, Ser 0.90  0.50 - 1.35 mg/dL   Glucose, Bld 199 (*) 70 - 99 mg/dL   Calcium, Ion 1.18  1.12 - 1.23 mmol/L   TCO2 25  0 - 100 mmol/L   Hemoglobin 15.6  13.0 - 17.0 g/dL   HCT 46.0  39.0 - 52.0 %  POCT ACTIVATED CLOTTING TIME     Status: None   Collection Time    06/21/13  8:48 PM      Result Value Range   Activated Clotting Time 370    MRSA PCR SCREENING     Status: None   Collection Time  06/21/13 10:00 PM      Result Value Range   MRSA by PCR NEGATIVE  NEGATIVE   Comment:            The GeneXpert MRSA Assay (FDA     approved for NASAL specimens     only), is one component of a     comprehensive MRSA colonization     surveillance program. It is not     intended to diagnose MRSA     infection nor to guide or     monitor treatment for     MRSA infections.  GLUCOSE, CAPILLARY     Status: Abnormal   Collection Time    06/21/13 10:24 PM      Result Value Range   Glucose-Capillary 168 (*) 70 - 99 mg/dL  CBC      Status: Abnormal   Collection Time    06/21/13 10:44 PM      Result Value Range   WBC 9.2  4.0 - 10.5 K/uL   RBC 4.76  4.22 - 5.81 MIL/uL   Hemoglobin 13.3  13.0 - 17.0 g/dL   HCT 38.9 (*) 39.0 - 52.0 %   MCV 81.7  78.0 - 100.0 fL   MCH 27.9  26.0 - 34.0 pg   MCHC 34.2  30.0 - 36.0 g/dL   RDW 14.0  11.5 - 15.5 %   Platelets 287  150 - 400 K/uL  CREATININE, SERUM     Status: None   Collection Time    06/21/13 10:44 PM      Result Value Range   Creatinine, Ser 0.81  0.50 - 1.35 mg/dL   GFR calc non Af Amer >90  >90 mL/min   GFR calc Af Amer >90  >90 mL/min   Comment: (NOTE)     The eGFR has been calculated using the CKD EPI equation.     This calculation has not been validated in all clinical situations.     eGFR's persistently <90 mL/min signify possible Chronic Kidney     Disease.  CBC     Status: Abnormal   Collection Time    06/22/13  2:15 AM      Result Value Range   WBC 9.7  4.0 - 10.5 K/uL   RBC 4.23  4.22 - 5.81 MIL/uL   Hemoglobin 12.0 (*) 13.0 - 17.0 g/dL   HCT 35.1 (*) 39.0 - 52.0 %   MCV 83.0  78.0 - 100.0 fL   MCH 28.4  26.0 - 34.0 pg   MCHC 34.2  30.0 - 36.0 g/dL   RDW 14.3  11.5 - 15.5 %   Platelets 242  150 - 400 K/uL  BASIC METABOLIC PANEL     Status: Abnormal   Collection Time    06/22/13  2:15 AM      Result Value Range   Sodium 139  137 - 147 mEq/L   Potassium 4.6  3.7 - 5.3 mEq/L   Chloride 102  96 - 112 mEq/L   CO2 22  19 - 32 mEq/L   Glucose, Bld 228 (*) 70 - 99 mg/dL   BUN 13  6 - 23 mg/dL   Creatinine, Ser 0.84  0.50 - 1.35 mg/dL   Calcium 8.2 (*) 8.4 - 10.5 mg/dL   GFR calc non Af Amer >90  >90 mL/min   GFR calc Af Amer >90  >90 mL/min   Comment: (NOTE)     The eGFR has been calculated using the  CKD EPI equation.     This calculation has not been validated in all clinical situations.     eGFR's persistently <90 mL/min signify possible Chronic Kidney     Disease.  GLUCOSE, CAPILLARY     Status: Abnormal   Collection Time    06/22/13   7:32 AM      Result Value Range   Glucose-Capillary 233 (*) 70 - 99 mg/dL  GLUCOSE, CAPILLARY     Status: Abnormal   Collection Time    06/22/13 11:25 AM      Result Value Range   Glucose-Capillary 238 (*) 70 - 99 mg/dL  GLUCOSE, CAPILLARY     Status: Abnormal   Collection Time    06/22/13  5:27 PM      Result Value Range   Glucose-Capillary 184 (*) 70 - 99 mg/dL  GLUCOSE, CAPILLARY     Status: Abnormal   Collection Time    06/22/13  9:35 PM      Result Value Range   Glucose-Capillary 166 (*) 70 - 99 mg/dL  BASIC METABOLIC PANEL     Status: Abnormal   Collection Time    06/23/13  2:13 AM      Result Value Range   Sodium 143  137 - 147 mEq/L   Potassium 4.6  3.7 - 5.3 mEq/L   Chloride 103  96 - 112 mEq/L   CO2 27  19 - 32 mEq/L   Glucose, Bld 173 (*) 70 - 99 mg/dL   BUN 13  6 - 23 mg/dL   Creatinine, Ser 0.82  0.50 - 1.35 mg/dL   Calcium 9.2  8.4 - 10.5 mg/dL   GFR calc non Af Amer >90  >90 mL/min   GFR calc Af Amer >90  >90 mL/min   Comment: (NOTE)     The eGFR has been calculated using the CKD EPI equation.     This calculation has not been validated in all clinical situations.     eGFR's persistently <90 mL/min signify possible Chronic Kidney     Disease.  CBC     Status: Abnormal   Collection Time    06/23/13  2:13 AM      Result Value Range   WBC 10.1  4.0 - 10.5 K/uL   RBC 4.33  4.22 - 5.81 MIL/uL   Hemoglobin 11.9 (*) 13.0 - 17.0 g/dL   HCT 36.1 (*) 39.0 - 52.0 %   MCV 83.4  78.0 - 100.0 fL   MCH 27.5  26.0 - 34.0 pg   MCHC 33.0  30.0 - 36.0 g/dL   RDW 14.3  11.5 - 15.5 %   Platelets 238  150 - 400 K/uL  PRO B NATRIURETIC PEPTIDE     Status: Abnormal   Collection Time    06/23/13  2:13 AM      Result Value Range   Pro B Natriuretic peptide (BNP) 537.1 (*) 0 - 125 pg/mL  LIPID PANEL     Status: Abnormal   Collection Time    06/23/13  2:13 AM      Result Value Range   Cholesterol 150  0 - 200 mg/dL   Triglycerides 222 (*) <150 mg/dL   HDL 22 (*) >39  mg/dL   Total CHOL/HDL Ratio 6.8     VLDL 44 (*) 0 - 40 mg/dL   LDL Cholesterol 84  0 - 99 mg/dL   Comment:            Total  Cholesterol/HDL:CHD Risk     Coronary Heart Disease Risk Table                         Men   Women      1/2 Average Risk   3.4   3.3      Average Risk       5.0   4.4      2 X Average Risk   9.6   7.1      3 X Average Risk  23.4   11.0                Use the calculated Patient Ratio     above and the CHD Risk Table     to determine the patient's CHD Risk.                ATP III CLASSIFICATION (LDL):      <100     mg/dL   Optimal      100-129  mg/dL   Near or Above                        Optimal      130-159  mg/dL   Borderline      160-189  mg/dL   High      >190     mg/dL   Very High  GLUCOSE, CAPILLARY     Status: Abnormal   Collection Time    06/23/13  7:57 AM      Result Value Range   Glucose-Capillary 181 (*) 70 - 99 mg/dL    Imaging: Dg Chest Port 1 View  06/21/2013   CLINICAL DATA:  Chest pain, altered mental status, hypertension, diabetes  EXAM: PORTABLE CHEST - 1 VIEW  COMPARISON:  None.  FINDINGS: Cardiomegaly with vascular congestion. No CHF or pneumonia. Negative for effusion or pneumothorax. Trachea midline.  IMPRESSION: Cardiomegaly and vascular congestion.   Electronically Signed   By: Daryll Brod M.D.   On: 06/21/2013 19:48    Assessment:  Principal Problem:   ST elevation myocardial infarction (STEMI) of inferolateral wall Active Problems:   HTN (hypertension)   Hyperlipidemia   DM (diabetes mellitus)   Acute right ventricular myocardial infarction   Plan:  He is much improved today. Feels that he can ambulate with cardiac rehab - I agree. NSVT overnight, likely secondary to reperfusion. BP can tolerate the addition of b-blocker today. Start low dose metoprolol 12.5 mg BID.  Ok to transfer to stepdown.  Anticipate d/c home tomorrow if he continues to improve.  Time Spent Directly with Patient:  20 minutes  Length of  Stay:  LOS: 2 days   Pixie Casino, MD, [facility] Attending Cardiologist [facility]  Andrez Lieurance C 06/23/2013, 9:07 AM

## 2013-06-23 NOTE — Progress Notes (Signed)
CARDIAC REHAB PHASE I   PRE:  Rate/Rhythm: 62SR  BP:  Supine:   Sitting: 114/65  Standing:    SaO2:   MODE:  Ambulation: 700 ft   POST:  Rate/Rhythm: 103ST  BP:  Supine:   Sitting: 126/68  Standing:    SaO2:  1130-1216 Pt walked 700 ft on RA with steady gait. Tolerated well. No CP. Education completed with pt and brother. Pt voiced understanding of ed. Discussed CRP 2 and permission given to refer to [facility] program. Gave heart healthy and diabetic diets. Said ready to make better food choices. Has brilinta and stent booklets.   Luetta Nutting, RN BSN  06/23/2013 12:13 PM

## 2013-06-23 NOTE — Progress Notes (Signed)
I agree with the assessment documented by Mavis, student nurse. 

## 2013-06-24 ENCOUNTER — Encounter (HOSPITAL_COMMUNITY): Payer: Self-pay | Admitting: Physician Assistant

## 2013-06-24 DIAGNOSIS — I255 Ischemic cardiomyopathy: Secondary | ICD-10-CM

## 2013-06-24 DIAGNOSIS — I2119 ST elevation (STEMI) myocardial infarction involving other coronary artery of inferior wall: Secondary | ICD-10-CM

## 2013-06-24 DIAGNOSIS — I2510 Atherosclerotic heart disease of native coronary artery without angina pectoris: Secondary | ICD-10-CM

## 2013-06-24 DIAGNOSIS — I4729 Other ventricular tachycardia: Secondary | ICD-10-CM

## 2013-06-24 DIAGNOSIS — I472 Ventricular tachycardia: Secondary | ICD-10-CM

## 2013-06-24 LAB — CBC
HCT: 36.9 % — ABNORMAL LOW (ref 39.0–52.0)
Hemoglobin: 12.3 g/dL — ABNORMAL LOW (ref 13.0–17.0)
MCH: 27.8 pg (ref 26.0–34.0)
MCHC: 33.3 g/dL (ref 30.0–36.0)
MCV: 83.5 fL (ref 78.0–100.0)
PLATELETS: 261 10*3/uL (ref 150–400)
RBC: 4.42 MIL/uL (ref 4.22–5.81)
RDW: 14.3 % (ref 11.5–15.5)
WBC: 11.2 10*3/uL — AB (ref 4.0–10.5)

## 2013-06-24 LAB — BASIC METABOLIC PANEL
BUN: 17 mg/dL (ref 6–23)
CALCIUM: 9.5 mg/dL (ref 8.4–10.5)
CO2: 25 mEq/L (ref 19–32)
Chloride: 101 mEq/L (ref 96–112)
Creatinine, Ser: 0.97 mg/dL (ref 0.50–1.35)
GFR calc Af Amer: >90 mL/min (ref >90)
GFR, EST NON AFRICAN AMERICAN: 89 mL/min — AB (ref >90)
GLUCOSE: 242 mg/dL — AB (ref 70–99)
POTASSIUM: 5 meq/L (ref 3.7–5.3)
SODIUM: 139 meq/L (ref 137–147)

## 2013-06-24 MED ORDER — ROSUVASTATIN CALCIUM 40 MG PO TABS
20.0000 mg | ORAL_TABLET | Freq: Every day | ORAL | Status: DC
Start: 2013-06-24 — End: 2013-09-19

## 2013-06-24 MED ORDER — METOPROLOL TARTRATE 25 MG PO TABS: 25.0000 mg | ORAL_TABLET | Freq: Two times a day (BID) | ORAL | Status: DC

## 2013-06-24 MED ORDER — TICAGRELOR 90 MG PO TABS: 90.0000 mg | ORAL_TABLET | Freq: Two times a day (BID) | ORAL | Status: DC

## 2013-06-24 MED ORDER — PANTOPRAZOLE SODIUM 40 MG IV SOLR
80.0000 mg | Freq: Every day | INTRAVENOUS | Status: DC
  Filled 2013-06-24: qty 80

## 2013-06-24 NOTE — Progress Notes (Signed)
DAILY PROGRESS NOTE  Subjective:   Feels great. No CP. Ambulating. No further NSVT on tele. Wants to go home.   Objective:  Temp:  [98 F (36.7 C)-98.5 F (36.9 C)] 98.4 F (36.9 C) (01/31 0128) Resp:  [18-24] 18 (01/31 0619) BP: (92-127)/(51-73) 113/61 mmHg (01/31 0619) SpO2:  [95 %] 95 % (01/30 1100) Weight change:   Intake/Output from previous day: 01/30 0701 - 01/31 0700 In: 840 [P.O.:840] Out: 2025 [Urine:2025]  Intake/Output from this shift:    Medications: Current Facility-Administered Medications  Medication Dose Route Frequency Provider Last Rate Last Dose  . acetaminophen (TYLENOL) tablet 650 mg  650 mg Oral Q4H PRN Peter M Martinique, MD   650 mg at 06/24/13 0212  . aspirin chewable tablet 81 mg  81 mg Oral Daily Peter M Martinique, MD   81 mg at 06/23/13 2831  . fenofibrate tablet 160 mg  160 mg Oral Daily Peter M Martinique, MD   160 mg at 06/23/13 0906  . heparin injection 5,000 Units  5,000 Units Subcutaneous Q8H Peter M Martinique, MD   5,000 Units at 06/24/13 [phone]  . insulin aspart (novoLOG) injection 0-15 Units  0-15 Units Subcutaneous TID WC Stephani Police, MD   2 Units at 06/23/13 1746  . insulin aspart (novoLOG) injection 0-5 Units  0-5 Units Subcutaneous QHS Stephani Police, MD   2 Units at 06/23/13 2246  . metoprolol tartrate (LOPRESSOR) tablet 12.5 mg  12.5 mg Oral BID Pixie Casino, MD   12.5 mg at 06/23/13 2241  . morphine 2 MG/ML injection 2 mg  2 mg Intravenous Q4H PRN Pixie Casino, MD   2 mg at 06/22/13 2149  . ondansetron (ZOFRAN) injection 4 mg  4 mg Intravenous Q6H PRN Peter M Martinique, MD      . rosuvastatin (CRESTOR) tablet 20 mg  20 mg Oral QHS Peter M Martinique, MD   20 mg at 06/23/13 2241  . sodium chloride 0.9 % bolus 500 mL  500 mL Intravenous PRN Stephani Police, MD      . Ticagrelor [facility]) tablet 90 mg  90 mg Oral BID Peter M Martinique, MD   90 mg at 06/23/13 2241    Physical Exam: General appearance: alert and no distress Neck: no carotid bruit  and no JVD Lungs: clear to auscultation bilaterally Heart: regular rate and rhythm, S1, S2 normal, no murmur, click, rub or gallop Abdomen: soft, non-tender; bowel sounds normal; no masses,  no organomegaly Extremities: extremities normal, atraumatic, no cyanosis or edema Pulses: 2+ and symmetric Skin: Skin color, texture, turgor normal. No rashes or lesions Neurologic: Grossly normal Psych: Pleasant  Lab Results: Results for orders placed during the hospital encounter of 06/21/13 (from the past 48 hour(s))  GLUCOSE, CAPILLARY     Status: Abnormal   Collection Time    06/22/13 11:25 AM      Result Value Range   Glucose-Capillary 238 (*) 70 - 99 mg/dL  GLUCOSE, CAPILLARY     Status: Abnormal   Collection Time    06/22/13  5:27 PM      Result Value Range   Glucose-Capillary 184 (*) 70 - 99 mg/dL  GLUCOSE, CAPILLARY     Status: Abnormal   Collection Time    06/22/13  9:35 PM      Result Value Range   Glucose-Capillary 166 (*) 70 - 99 mg/dL  BASIC METABOLIC PANEL     Status: Abnormal   Collection Time    06/23/13  2:13 AM      Result Value Range   Sodium 143  137 - 147 mEq/L   Potassium 4.6  3.7 - 5.3 mEq/L   Chloride 103  96 - 112 mEq/L   CO2 27  19 - 32 mEq/L   Glucose, Bld 173 (*) 70 - 99 mg/dL   BUN 13  6 - 23 mg/dL   Creatinine, Ser 0.82  0.50 - 1.35 mg/dL   Calcium 9.2  8.4 - 10.5 mg/dL   GFR calc non Af Amer >90  >90 mL/min   GFR calc Af Amer >90  >90 mL/min   Comment: (NOTE)     The eGFR has been calculated using the CKD EPI equation.     This calculation has not been validated in all clinical situations.     eGFR's persistently <90 mL/min signify possible Chronic Kidney     Disease.  CBC     Status: Abnormal   Collection Time    06/23/13  2:13 AM      Result Value Range   WBC 10.1  4.0 - 10.5 K/uL   RBC 4.33  4.22 - 5.81 MIL/uL   Hemoglobin 11.9 (*) 13.0 - 17.0 g/dL   HCT 36.1 (*) 39.0 - 52.0 %   MCV 83.4  78.0 - 100.0 fL   MCH 27.5  26.0 - 34.0 pg   MCHC  33.0  30.0 - 36.0 g/dL   RDW 14.3  11.5 - 15.5 %   Platelets 238  150 - 400 K/uL  PRO B NATRIURETIC PEPTIDE     Status: Abnormal   Collection Time    06/23/13  2:13 AM      Result Value Range   Pro B Natriuretic peptide (BNP) 537.1 (*) 0 - 125 pg/mL  LIPID PANEL     Status: Abnormal   Collection Time    06/23/13  2:13 AM      Result Value Range   Cholesterol 150  0 - 200 mg/dL   Triglycerides 222 (*) <150 mg/dL   HDL 22 (*) >39 mg/dL   Total CHOL/HDL Ratio 6.8     VLDL 44 (*) 0 - 40 mg/dL   LDL Cholesterol 84  0 - 99 mg/dL   Comment:            Total Cholesterol/HDL:CHD Risk     Coronary Heart Disease Risk Table                         Men   Women      1/2 Average Risk   3.4   3.3      Average Risk       5.0   4.4      2 X Average Risk   9.6   7.1      3 X Average Risk  23.4   11.0                Use the calculated Patient Ratio     above and the CHD Risk Table     to determine the patient's CHD Risk.                ATP III CLASSIFICATION (LDL):      <100     mg/dL   Optimal      100-129  mg/dL   Near or Above  Optimal      130-159  mg/dL   Borderline      160-189  mg/dL   High      >190     mg/dL   Very High  GLUCOSE, CAPILLARY     Status: Abnormal   Collection Time    06/23/13  7:57 AM      Result Value Range   Glucose-Capillary 181 (*) 70 - 99 mg/dL  GLUCOSE, CAPILLARY     Status: Abnormal   Collection Time    06/23/13 12:47 PM      Result Value Range   Glucose-Capillary 148 (*) 70 - 99 mg/dL  GLUCOSE, CAPILLARY     Status: Abnormal   Collection Time    06/23/13  5:34 PM      Result Value Range   Glucose-Capillary 141 (*) 70 - 99 mg/dL  GLUCOSE, CAPILLARY     Status: Abnormal   Collection Time    06/23/13 10:30 PM      Result Value Range   Glucose-Capillary 222 (*) 70 - 99 mg/dL  BASIC METABOLIC PANEL     Status: Abnormal   Collection Time    06/24/13  4:30 AM      Result Value Range   Sodium 139  137 - 147 mEq/L   Potassium 5.0   3.7 - 5.3 mEq/L   Chloride 101  96 - 112 mEq/L   CO2 25  19 - 32 mEq/L   Glucose, Bld 242 (*) 70 - 99 mg/dL   BUN 17  6 - 23 mg/dL   Creatinine, Ser 0.97  0.50 - 1.35 mg/dL   Calcium 9.5  8.4 - 10.5 mg/dL   GFR calc non Af Amer 89 (*) >90 mL/min   GFR calc Af Amer >90  >90 mL/min   Comment: (NOTE)     The eGFR has been calculated using the CKD EPI equation.     This calculation has not been validated in all clinical situations.     eGFR's persistently <90 mL/min signify possible Chronic Kidney     Disease.  CBC     Status: Abnormal   Collection Time    06/24/13  4:30 AM      Result Value Range   WBC 11.2 (*) 4.0 - 10.5 K/uL   RBC 4.42  4.22 - 5.81 MIL/uL   Hemoglobin 12.3 (*) 13.0 - 17.0 g/dL   HCT 36.9 (*) 39.0 - 52.0 %   MCV 83.5  78.0 - 100.0 fL   MCH 27.8  26.0 - 34.0 pg   MCHC 33.3  30.0 - 36.0 g/dL   RDW 14.3  11.5 - 15.5 %   Platelets 261  150 - 400 K/uL    Imaging: No results found.  Assessment:  Principal Problem:   ST elevation myocardial infarction (STEMI) of inferolateral wall Active Problems:   HTN (hypertension)   Hyperlipidemia   DM (diabetes mellitus)   Acute right ventricular myocardial infarction CAD - s/p stenting of LCx on 1/28. Minimal residual CAD NSVT  Plan:  Looks great. Stable for d/c today on ASA, Brilinta, lopressor 25 bid, crestor 40. Refer cardiac rehab.    Length of Stay:  LOS: 3 days    Glori Bickers MD 06/24/2013, 9:19 AM

## 2013-06-24 NOTE — Discharge Instructions (Signed)
Do not miss a dose of Aspirin or Brilinta. °

## 2013-06-24 NOTE — Discharge Summary (Signed)
Agreed. See daily rounding note for further details.  Daniel Bensimhon,MD 3:59 PM

## 2013-06-24 NOTE — Discharge Summary (Signed)
Discharge Summary   Patient ID: Tyler Garner, MRN: [id number], DOB/AGE: 59/07/1955 59 y.o.  Admit date: 06/21/2013 Discharge date: 06/24/2013   Primary Care Physician:  Rudi HeapMOORE, DONALD   Primary Cardiologist:  He will be established with Dr. Rollene RotundaJames Hochrein   Reason for Admission:  Inferior STEMI   Primary Discharge Diagnoses:  Principal Problem:   ST elevation myocardial infarction (STEMI) of inferolateral wall  - treated with DES to CFX  Active Problems:   HTN (hypertension)   Hyperlipidemia   DM (diabetes mellitus)   Acute right ventricular myocardial infarction   Coronary atherosclerosis of native coronary artery   NSVT (nonsustained ventricular tachycardia)   Ischemic cardiomyopathy     Wt Readings from Last 3 Encounters:  06/21/13 235 lb 0.2 oz (106.6 kg)  06/21/13 235 lb 0.2 oz (106.6 kg)  06/06/13 243 lb (110.224 kg)    Secondary Discharge Diagnoses:   Past Medical History  Diagnosis Date  . Hypertension   . Hyperglycemia   . Nicotine addiction   . Hyperlipidemia   . Renal colic   . Right shoulder injury 10/11/09  . Diabetes mellitus without complication   . CAD (coronary artery disease)     a. inferior STEMI (05/2013) with RV involvement- LHC:  dist CFX occluded (Promus premier (2.5x24 mm) DES); EF 40-45% with inf HK;  b. Echo (report pending) with EF 45-50%, lat WMA, RVE, mod RV hypokinesis   . Ischemic cardiomyopathy       Allergies:    Allergies  Allergen Reactions  . Percocet [Oxycodone-Acetaminophen] Nausea And Vomiting      Procedures Performed This Admission:    1.  Cardiac Catheterization and Percutaneous Coronary Intervention (06/21/2013):  Coronary angiography: Coronary dominance: left-  Left mainstem: Normal  Left anterior descending (LAD):  Mild wall irregularities  Left circumflex (LCx): Large dominant vessel. There are mild irregularities in the proximal and mid vessel. The LCx is occluded distally following the second  OM.  Right coronary artery (RCA): small, nondominant. Normal.  Left ventriculography: Left ventricular systolic function is abnormal, LVEF is estimated at 40-45%, there is inferior hypokinesis, there is no significant mitral regurgitation   PCI Data: Vessel - Dominant LCx/Segment - distal Percent Stenosis (pre)  100% TIMI-flow 0 Stent 2.5 x 24 mm Promus stent. Percent Stenosis (post) 0% TIMI-flow (post) 3  Final Conclusions:   1. Single vessel obstructive CAD 2. Mild to moderate LV dysfunction. 3. Successful stenting of the distal LCx with reperfusion of the PDA and PLOM branches   Recommendations:  Dual antiplatelet therapy for one year.    Theron Aristaeter [facility] HospitalJordanMD,FACC 06/21/2013, 9:36 PM    Hospital Course:  Tyler Garner is a 58 y.o. male with a hx of HTN, HL, DM.  He presented to [facility] ED with prolonged chest pain that had begun suddenly 2.5 hours prior presentation.  ECG demonstrated inferior STE and he was transferred to [facility] for emergent LHC.  LHC demonstrated an occluded distal CFX following OM2 and EF 40-45% with inf HK.  He underwent PCI with Promus DES to the distal CFX (Dr. Peter SwazilandJordan).  Dual antiplatelet Rx recommended x 1 year.   He did have some brief NSVT post PCI felt to be due to reperfusion.  His BP was soft limiting initiation of beta blocker or ACEI.  He underwent an echocardiogram (report not in system at this time - but according to notes) that demonstrated EF of 45-50%, lateral wall motion abnormality and the RV is dilated and  moderately hypokinetic (RV infarct from dominant CFX lesion).  He remained chest pain free.  He had no further NSVT on tele.  His BP improved and he was able to tolerate low dose beta blocker.  He was seen by Dr. Arvilla Meres this AM and felt to be stable for d/c to home.  Consider adding ACEI in follow up if BP will tolerate.  He lives in [place] and we will arrange follow up with Dr. Rollene Rotunda after his follow up in 1-2  weeks.  Will arrange [facility] appointment.    Discharge Vitals:   Blood pressure 113/61, pulse 61, temperature 98.4 F (36.9 C), temperature source Oral, resp. rate 18, height 5\' 10"  (1.778 m), weight 235 lb 0.2 oz (106.6 kg), SpO2 95.00%.   Labs:   Recent Labs  06/22/13 0215 06/23/13 0213 06/24/13 0430  WBC 9.7 10.1 11.2*  HGB 12.0* 11.9* 12.3*  HCT 35.1* 36.1* 36.9*  MCV 83.0 83.4 83.5  PLT 242 238 261     Recent Labs  06/21/13 1931  06/22/13 0215 06/23/13 0213 06/24/13 0430  NA 139  < > 139 143 139  K 3.7  < > 4.6 4.6 5.0  CL 97  < > 102 103 101  CO2 26  --  22 27 25   BUN 15  < > 13 13 17   CREATININE 0.90  < > 0.84 0.82 0.97  CALCIUM 9.8  --  8.2* 9.2 9.5  PROT 7.9  --   --   --   --   BILITOT 0.3  --   --   --   --   ALKPHOS 80  --   --   --   --   ALT 32  --   --   --   --   AST 34  --   --   --   --   < > = values in this interval not displayed.   Lab Results  Component Value Date   CHOL 150 06/23/2013   HDL 22* 06/23/2013   LDLCALC 84 06/23/2013   TRIG 222* 06/23/2013     Recent Labs  06/21/13 1931  INR 1.04     Diagnostic Procedures and Studies:  Dg Chest Port 1 View  06/21/2013      IMPRESSION: Cardiomegaly and vascular congestion.   Electronically Signed   By: Ruel Favors M.D.   On: 06/21/2013 19:48     2D Echocardiogram 06/22/2013:  EF of 45-50%, lateral wall motion abnormality and the RV is dilated and moderately hypokinetic (RV infarct from dominant CFX lesion) - report pending   Disposition:   Pt is being discharged home today in good condition.  Follow-up Plans & Appointments      Follow-up Information   Follow up with Tereso Newcomer, PA-C In 1 week. (office will call)    Specialty:  Physician Assistant   Contact information:   [address] [place] [address] [phone]       Discharge Medications    Medication List    STOP taking these medications       amLODipine 10 MG tablet  Commonly known  as:  NORVASC     Choline Fenofibrate 135 MG capsule  Commonly known as:  TRILIPIX      TAKE these medications       aspirin 81 MG tablet  Take 81 mg by mouth daily.     glimepiride 4 MG tablet  Commonly known as:  AMARYL  Take 1 tablet (4 mg total) by mouth 2 (two) times daily.     metoprolol tartrate 25 MG tablet  Commonly known as:  LOPRESSOR  Take 1 tablet (25 mg total) by mouth 2 (two) times daily.     rosuvastatin 40 MG tablet  Commonly known as:  CRESTOR  Take 0.5 tablets (20 mg total) by mouth daily.     sitaGLIPtin-metformin 50-1000 MG per tablet  Commonly known as:  JANUMET  Take 1 tablet by mouth 2 (two) times daily with a meal.     Ticagrelor 90 MG Tabs tablet  Commonly known as:  BRILINTA  Take 1 tablet (90 mg total) by mouth 2 (two) times daily.         Outstanding Labs/Studies  Check Lipids and LFTs in 6 weeks.    Duration of Discharge Encounter: Greater than 30 minutes including physician and PA time.  Signed, Tereso Newcomer, PA-C   06/24/2013 10:56 AM

## 2013-06-26 ENCOUNTER — Telehealth: Payer: Self-pay | Admitting: Physician Assistant

## 2013-06-26 NOTE — Telephone Encounter (Signed)
New Problem:  Per after hours VM... Pt scheduled w/ Tereso Newcomer on 2/11 at 12/10 for TOC... Pt is aware of appt.

## 2013-06-28 NOTE — Telephone Encounter (Signed)
TCM pt.  D/c from hospital last Saturday 06/24/13 after cath.  States he is doing good except he is "tired".  Reviewed his meds and one of new meds is Metoprolol.  Advised that would cause him to be tired. States he has had a couple of spells when he "loses his breath for a second" but then is fine.  Denies SOB, CP.  States cath site on (R) arm has healed.  No redness, still some bruising.  Only c/o is he has a sinus headache that he takes Tylenol for relief.  Is aware of his app on 2/11 with Tereso Newcomer.  No other questions or concerns.  Will call if has any questions.

## 2013-07-05 ENCOUNTER — Encounter: Payer: Self-pay | Admitting: *Deleted

## 2013-07-05 ENCOUNTER — Encounter: Payer: Self-pay | Admitting: Physician Assistant

## 2013-07-05 ENCOUNTER — Ambulatory Visit (INDEPENDENT_AMBULATORY_CARE_PROVIDER_SITE_OTHER): Payer: BC Managed Care – PPO | Admitting: Physician Assistant

## 2013-07-05 VITALS — BP 140/82 | HR 67 | Ht 69.5 in | Wt 233.0 lb

## 2013-07-05 DIAGNOSIS — I10 Essential (primary) hypertension: Secondary | ICD-10-CM

## 2013-07-05 DIAGNOSIS — I2510 Atherosclerotic heart disease of native coronary artery without angina pectoris: Secondary | ICD-10-CM

## 2013-07-05 DIAGNOSIS — I255 Ischemic cardiomyopathy: Secondary | ICD-10-CM

## 2013-07-05 DIAGNOSIS — I472 Ventricular tachycardia: Secondary | ICD-10-CM

## 2013-07-05 DIAGNOSIS — I4729 Other ventricular tachycardia: Secondary | ICD-10-CM

## 2013-07-05 DIAGNOSIS — I252 Old myocardial infarction: Secondary | ICD-10-CM

## 2013-07-05 DIAGNOSIS — E785 Hyperlipidemia, unspecified: Secondary | ICD-10-CM

## 2013-07-05 MED ORDER — LISINOPRIL 5 MG PO TABS: 5.0000 mg | ORAL_TABLET | Freq: Every day | ORAL | Status: DC

## 2013-07-05 NOTE — Patient Instructions (Signed)
Your physician recommends that you schedule a follow-up appointment in: 4-6 WEEKS WITH DR [facility] IN [place]  START LSIINOPRIL 5 MG ONCE DAILY  Your physician recommends that you return for lab work in: ONE WEEK AT DR [facility] OFFICE

## 2013-07-05 NOTE — Progress Notes (Signed)
[address] [place], [place]  [address] Phone: [phone] Fax:  [phone]  Date:  07/05/2013   ID:  Tyler Garner, DOB 03-11-1956, MRN [id number]  PCP:  Rudi Heap, MD  Cardiologist:  Dr. Rollene Rotunda     History of Present Illness: Tyler Garner is a 58 y.o. male with a hx of HTN, HL, diabetes. He was recently admitted 1/20-1/31 with an inferior STEMI. LHC demonstrated an occluded distal circumflex and an EF of 40-45% with inferior HK. He underwent PCI with a Promus DES to the distal CFX. Dual antiplatelet therapy was recommended for one year. He did have an echocardiogram that demonstrated a dilated RV that was moderately hypokinetic signifying RV involvement due to infarct from a dominant CFX. EF was 45-50%. Of note, the report of the echocardiogram is not in [facility]. Blood pressure is soft and he was able only tolerate low dose beta blocker. He returns for follow up. He did have some dyspnea initially. This is resolved. He denies orthopnea, PND or edema. He denies chest pain or syncope.   Recent Labs: 06/21/2013: ALT 32  06/23/2013: HDL Cholesterol 22*; LDL (calc) 84; Pro B Natriuretic peptide (BNP) 537.1*  06/24/2013: Creatinine 0.97; Hemoglobin 12.3*; Potassium 5.0   Wt Readings from Last 3 Encounters:  07/05/13 233 lb (105.688 kg)  06/21/13 235 lb 0.2 oz (106.6 kg)  06/21/13 235 lb 0.2 oz (106.6 kg)     Past Medical History  Diagnosis Date  . Hypertension   . Hyperglycemia   . Nicotine addiction   . Hyperlipidemia   . Renal colic   . Right shoulder injury 10/11/09  . Diabetes mellitus without complication   . CAD (coronary artery disease)     a. inferior STEMI (05/2013) with RV involvement- LHC:  dist CFX occluded (Promus premier (2.5x24 mm) DES); EF 40-45% with inf HK;  b. Echo (report pending) with EF 45-50%, lat WMA, RVE, mod RV hypokinesis   . Ischemic cardiomyopathy     Current Outpatient Prescriptions  Medication Sig Dispense Refill  .  aspirin 81 MG tablet Take 81 mg by mouth daily.        Marland Kitchen glimepiride (AMARYL) 4 MG tablet Take 1 tablet (4 mg total) by mouth 2 (two) times daily.  60 tablet  5  . metoprolol tartrate (LOPRESSOR) 25 MG tablet Take 1 tablet (25 mg total) by mouth 2 (two) times daily.  60 tablet  11  . rosuvastatin (CRESTOR) 40 MG tablet Take 0.5 tablets (20 mg total) by mouth daily.  30 tablet  11  . sitaGLIPtin-metformin (JANUMET) 50-1000 MG per tablet Take 1 tablet by mouth 2 (two) times daily with a meal.  60 tablet  5  . Ticagrelor (BRILINTA) 90 MG TABS tablet Take 1 tablet (90 mg total) by mouth 2 (two) times daily.  60 tablet  11   No current facility-administered medications for this visit.    Allergies:   Percocet   Social History:  The patient  reports that he has quit smoking. He does not have any smokeless tobacco history on file. He reports that he drinks alcohol. He reports that he does not use illicit drugs.   Family History:  The patient's family history includes Diabetes in his brother and mother.   ROS:  Please see the history of present illness.      All other systems reviewed and negative.   PHYSICAL EXAM: VS:  BP 140/82  Pulse 67  Ht 5'  9.5" (1.765 m)  Wt 233 lb (105.688 kg)  BMI 33.93 kg/m2 Well nourished, well developed, in no acute distress HEENT: normal Neck: no JVD Cardiac:  normal S1, S2; RRR; no murmur Lungs:  clear to auscultation bilaterally, no wheezing, rhonchi or rales Abd: soft, nontender, no hepatomegaly Ext: no edemaright wrist without hematoma or mass  Skin: warm and dry Neuro:  CNs 2-12 intact, no focal abnormalities noted  EKG:  NSR, HR 67, inferior Q waves with inferior T-wave inversions, rightward axis     ASSESSMENT AND PLAN:  1. CAD, Status Post Recent Inferior STEMI: He is doing well after a recent inferior STEMI with RV involvement. He understands the importance of dual antiplatelet therapy. He has been compliant with aspirin and Brilinta.  He may have  had some dyspnea related to Brilinta, however this is resolved. Continue beta blocker and statin. Refer to cardiac rehabilitation at [facility]. His job is fairly labor-intensive and he is worried about returning so soon. I have given him a note to remain out of work until he is seen back in follow up. 2. Ischemic Cardiomyopathy: EF 45-50% by echocardiogram. Continue beta blocker. Start ACEI with lisinopril 5 mg daily. Check a basic metabolic panel in one week. 3. Hypertension: Controlled. 4. Hyponatremic: Continue statin. This is managed by primary care. 5. Disposition: Patient will be established with Dr. Antoine PocheHochrein for follow up in the next 6 weeks in [place] lives.  Signed, Tereso NewcomerScott Weaver, PA-C  07/05/2013 1:10 PM

## 2013-07-12 ENCOUNTER — Other Ambulatory Visit (INDEPENDENT_AMBULATORY_CARE_PROVIDER_SITE_OTHER): Payer: BC Managed Care – PPO

## 2013-07-12 DIAGNOSIS — I10 Essential (primary) hypertension: Secondary | ICD-10-CM

## 2013-07-13 LAB — BMP8+EGFR
BUN/Creatinine Ratio: 21 — ABNORMAL HIGH (ref 9–20)
BUN: 16 mg/dL (ref 6–24)
CALCIUM: 10.3 mg/dL — AB (ref 8.7–10.2)
CO2: 26 mmol/L (ref 18–29)
Chloride: 100 mmol/L (ref 97–108)
Creatinine, Ser: 0.78 mg/dL (ref 0.76–1.27)
GFR calc Af Amer: 115 mL/min/{1.73_m2} (ref >59)
GFR calc non Af Amer: 99 mL/min/{1.73_m2} (ref >59)
Glucose: 66 mg/dL (ref 65–99)
POTASSIUM: 5.2 mmol/L (ref 3.5–5.2)
Sodium: 144 mmol/L (ref 134–144)

## 2013-07-13 NOTE — Progress Notes (Signed)
Quick Note:  Preliminary report reviewed by triage nurse and sent to MD desk. ______ 

## 2013-07-23 DIAGNOSIS — E785 Hyperlipidemia, unspecified: Secondary | ICD-10-CM

## 2013-07-23 DIAGNOSIS — I2510 Atherosclerotic heart disease of native coronary artery without angina pectoris: Secondary | ICD-10-CM

## 2013-07-23 DIAGNOSIS — I2589 Other forms of chronic ischemic heart disease: Secondary | ICD-10-CM

## 2013-07-23 DIAGNOSIS — I252 Old myocardial infarction: Secondary | ICD-10-CM

## 2013-07-23 DIAGNOSIS — I10 Essential (primary) hypertension: Secondary | ICD-10-CM

## 2013-08-09 ENCOUNTER — Ambulatory Visit (INDEPENDENT_AMBULATORY_CARE_PROVIDER_SITE_OTHER): Payer: BC Managed Care – PPO | Admitting: Cardiology

## 2013-08-09 ENCOUNTER — Encounter: Payer: Self-pay | Admitting: Cardiology

## 2013-08-09 VITALS — BP 141/89 | HR 79 | Ht 69.0 in | Wt 239.0 lb

## 2013-08-09 DIAGNOSIS — E785 Hyperlipidemia, unspecified: Secondary | ICD-10-CM

## 2013-08-09 DIAGNOSIS — I2510 Atherosclerotic heart disease of native coronary artery without angina pectoris: Secondary | ICD-10-CM

## 2013-08-09 DIAGNOSIS — I10 Essential (primary) hypertension: Secondary | ICD-10-CM

## 2013-08-09 NOTE — Progress Notes (Signed)
HPI The patient presents for evaluation of an inferior myocardial infarction that occurred in late January. He had CIN DES stenting to the distal dominant circumflex. I reviewed the results of the catheterization. A mildly reduced EF and 5-50%. He wants to followup here because he lives in this area. He had some mild plaque in his other coronaries. At the last visit he did have lisinopril started as his blood pressure was slightly elevated and his ejection fraction slightly low. He had had some mild complaints of dyspnea with this seems to have resolved except for one isolated episode a few nights ago. He otherwise says he is doing well. He's not having any chest pressure, neck or arm discomfort. He's not having any palpitations, presyncope or syncope. He's not describing PND or orthopnea. He has had no weight gain or edema.   Allergies  Allergen Reactions  . Percocet [Oxycodone-Acetaminophen] Nausea And Vomiting    Current Outpatient Prescriptions  Medication Sig Dispense Refill  . aspirin 81 MG tablet Take 81 mg by mouth daily.        Marland Kitchen. glimepiride (AMARYL) 4 MG tablet Take 1 tablet (4 mg total) by mouth 2 (two) times daily.  60 tablet  5  . lisinopril (PRINIVIL,ZESTRIL) 5 MG tablet Take 1 tablet (5 mg total) by mouth daily.  90 tablet  3  . metoprolol tartrate (LOPRESSOR) 25 MG tablet Take 1 tablet (25 mg total) by mouth 2 (two) times daily.  60 tablet  11  . rosuvastatin (CRESTOR) 40 MG tablet Take 0.5 tablets (20 mg total) by mouth daily.  30 tablet  11  . sitaGLIPtin-metformin (JANUMET) 50-1000 MG per tablet Take 1 tablet by mouth 2 (two) times daily with a meal.  60 tablet  5  . Ticagrelor (BRILINTA) 90 MG TABS tablet Take 1 tablet (90 mg total) by mouth 2 (two) times daily.  60 tablet  11   No current facility-administered medications for this visit.    Past Medical History  Diagnosis Date  . Hypertension   . Hyperglycemia   . Nicotine addiction   . Hyperlipidemia   . Renal  colic   . Right shoulder injury 10/11/09  . Diabetes mellitus without complication   . CAD (coronary artery disease)     a. inferior STEMI (05/2013) with RV involvement- LHC:  dist CFX occluded (Promus premier (2.5x24 mm) DES); EF 40-45% with inf HK;  b. Echo (report pending) with EF 45-50%, lat WMA, RVE, mod RV hypokinesis   . Ischemic cardiomyopathy     No past surgical history on file.  ROS:  As stated in the HPI and negative for all other systems.  PHYSICAL EXAM BP 141/89  Pulse 79  Ht 5\' 9"  (1.753 m)  Wt 239 lb (108.41 kg)  BMI 35.28 kg/m2 GENERAL:  Well appearing NECK:  No jugular venous distention, waveform within normal limits, carotid upstroke brisk and symmetric, no bruits, no thyromegaly LYMPHATICS:  No cervical, inguinal adenopathy LUNGS:  Clear to auscultation bilaterally CHEST:  Unremarkable HEART:  PMI not displaced or sustained,S1 and S2 within normal limits, no S3, no S4, no clicks, no rubs, no murmurs ABD:  Flat, positive bowel sounds normal in frequency in pitch, no bruits, no rebound, no guarding, no midline pulsatile mass, no hepatomegaly, no splenomegaly EXT:  2 plus pulses throughout, no edema, no cyanosis no clubbing  ASSESSMENT AND PLAN  CAD:  The patient has no new sypmtoms.  No further cardiovascular testing is indicated.  We  will continue with aggressive risk reduction and meds as listed.    Ischemic Cardiomyopathy: EF 45-50% by echocardiogram. Continue beta blocker and low dose ACE.  I will follow up with an echo in the months to come.   Hypertension: BP is slightly up.  However, I would like for him to lose weight.  I will continue the meds as listed.  Weight:  The patient understands the need to lose weight with diet and exercise. We have discussed specific strategies for this.  Hyperlipidemia: He will continue the meds as listed.  I will defer management to Rudi Heap, MD

## 2013-08-09 NOTE — Patient Instructions (Signed)
The current medical regimen is effective;  continue present plan and medications.  Follow up in 6 months with Dr Hochrein.  You will receive a letter in the mail 2 months before you are due.  Please call us when you receive this letter to schedule your follow up appointment.  

## 2013-09-19 ENCOUNTER — Encounter (INDEPENDENT_AMBULATORY_CARE_PROVIDER_SITE_OTHER): Payer: Self-pay

## 2013-09-19 ENCOUNTER — Encounter: Payer: Self-pay | Admitting: Nurse Practitioner

## 2013-09-19 ENCOUNTER — Ambulatory Visit (INDEPENDENT_AMBULATORY_CARE_PROVIDER_SITE_OTHER): Payer: BC Managed Care – PPO | Admitting: Nurse Practitioner

## 2013-09-19 VITALS — BP 136/83 | HR 72 | Temp 97.3°F | Ht 69.0 in | Wt 236.0 lb

## 2013-09-19 DIAGNOSIS — I472 Ventricular tachycardia: Secondary | ICD-10-CM

## 2013-09-19 DIAGNOSIS — I2510 Atherosclerotic heart disease of native coronary artery without angina pectoris: Secondary | ICD-10-CM

## 2013-09-19 DIAGNOSIS — I10 Essential (primary) hypertension: Secondary | ICD-10-CM

## 2013-09-19 DIAGNOSIS — E119 Type 2 diabetes mellitus without complications: Secondary | ICD-10-CM

## 2013-09-19 DIAGNOSIS — E785 Hyperlipidemia, unspecified: Secondary | ICD-10-CM

## 2013-09-19 DIAGNOSIS — I4729 Other ventricular tachycardia: Secondary | ICD-10-CM

## 2013-09-19 LAB — POCT GLYCOSYLATED HEMOGLOBIN (HGB A1C): Hemoglobin A1C: 7.1

## 2013-09-19 MED ORDER — METOPROLOL TARTRATE 25 MG PO TABS: 25.0000 mg | ORAL_TABLET | Freq: Two times a day (BID) | ORAL | Status: DC

## 2013-09-19 MED ORDER — ROSUVASTATIN CALCIUM 40 MG PO TABS: 20.0000 mg | ORAL_TABLET | Freq: Every day | ORAL | Status: DC

## 2013-09-19 MED ORDER — SITAGLIPTIN PHOS-METFORMIN HCL 50-1000 MG PO TABS: 1.0000 | ORAL_TABLET | Freq: Two times a day (BID) | ORAL | Status: DC

## 2013-09-19 MED ORDER — GLIMEPIRIDE 4 MG PO TABS: 4.0000 mg | ORAL_TABLET | Freq: Two times a day (BID) | ORAL | Status: DC

## 2013-09-19 MED ORDER — TICAGRELOR 90 MG PO TABS: 90.0000 mg | ORAL_TABLET | Freq: Two times a day (BID) | ORAL | Status: DC

## 2013-09-19 MED ORDER — LISINOPRIL 5 MG PO TABS: 5.0000 mg | ORAL_TABLET | Freq: Every day | ORAL | Status: DC

## 2013-09-19 NOTE — Patient Instructions (Signed)

## 2013-09-19 NOTE — Progress Notes (Signed)
Subjective:    Patient ID: Tyler Garner, male    DOB: [birth year], 58 y.o.   MRN: [id number]  Patient here today for follow up- no changes since last visit.  Hyperlipidemia This is a chronic problem. The current episode started more than 1 year ago. The problem is uncontrolled. Recent lipid tests were reviewed and are high. There are no known factors aggravating his hyperlipidemia. Pertinent negatives include no leg pain, myalgias or shortness of breath. Current antihyperlipidemic treatment includes statins and fibric acid derivatives. The current treatment provides moderate improvement of lipids. Compliance problems include adherence to diet and adherence to exercise.  Risk factors for coronary artery disease include hypertension and male sex.  Hypertension This is a chronic problem. The current episode started more than 1 year ago. The problem has been waxing and waning since onset. The problem is uncontrolled. Pertinent negatives include no blurred vision, headaches, malaise/fatigue, palpitations, peripheral edema, shortness of breath or sweats. There are no associated agents to hypertension. Risk factors for coronary artery disease include diabetes mellitus, dyslipidemia and male gender. Past treatments include calcium channel blockers and ACE inhibitors (not taking ace like suppose to- didn't realize he needed to stay on it.). The current treatment provides moderate improvement. Compliance problems include diet and exercise.   Diabetes He presents for his follow-up diabetic visit. He has type 2 diabetes mellitus. No MedicAlert identification noted. The initial diagnosis of diabetes was made 3 years ago. His disease course has been stable. There are no hypoglycemic associated symptoms. Pertinent negatives for hypoglycemia include no headaches or sweats. Pertinent negatives for diabetes include no blurred vision, no fatigue, no foot ulcerations, no polydipsia, no polyphagia, no polyuria, no visual  change and no weight loss. There are no hypoglycemic complications. Symptoms are stable. There are no diabetic complications. Risk factors for coronary artery disease include diabetes mellitus, dyslipidemia, hypertension, male sex and obesity. Current diabetic treatment includes oral agent (dual therapy) and diet. He is compliant with treatment most of the time. He is following a generally healthy diet. When asked about meal planning, he reported none. He has not had a previous visit with a dietician. He rarely participates in exercise. His home blood glucose trend is fluctuating minimally. His breakfast blood glucose is taken between 8-9 am. His breakfast blood glucose range is generally 90-110 mg/dl. An ACE inhibitor/angiotensin II receptor blocker is not being taken. He does not see a podiatrist.Eye exam is current (03/2012).     Review of Systems  Constitutional: Negative for weight loss, malaise/fatigue and fatigue.  Eyes: Negative for blurred vision.  Respiratory: Negative for shortness of breath.   Cardiovascular: Negative for palpitations.  Endocrine: Negative for polydipsia, polyphagia and polyuria.  Musculoskeletal: Negative for myalgias.  Neurological: Negative for headaches.  All other systems reviewed and are negative.      Objective:   Physical Exam  Constitutional: He is oriented to person, place, and time. He appears well-developed and well-nourished.  HENT:  Head: Normocephalic.  Right Ear: External ear normal.  Left Ear: External ear normal.  Nose: Nose normal.  Mouth/Throat: Oropharynx is clear and moist.  Eyes: EOM are normal. Pupils are equal, round, and reactive to light.  Neck: Normal range of motion. Neck supple. No thyromegaly present.  Cardiovascular: Normal rate, regular rhythm, normal heart sounds and intact distal pulses.   No murmur heard. Pulmonary/Chest: Effort normal and breath sounds normal. He has no wheezes. He has no rales.  Abdominal: Soft. Bowel  sounds are normal.  Genitourinary:  Refuses rectal exam  Musculoskeletal: Normal range of motion.  Neurological: He is alert and oriented to person, place, and time.  Skin: Skin is warm and dry.  Psychiatric: He has a normal mood and affect. His behavior is normal. Judgment and thought content normal.   BP 136/83  Pulse 72  Temp(Src) 97.3 F (36.3 C) (Oral)  Ht _0  (1.753 m)  Wt 236 lb (107.049 kg)  BMI 34.84 kg/m2  Results for orders placed in visit on 09/19/13  POCT GLYCOSYLATED HEMOGLOBIN (HGB A1C)      Result Value Ref Range   Hemoglobin A1C 7.1%        Assessment & Plan:   1. DM (diabetes mellitus)   2. HTN (hypertension)   3. Hyperlipidemia   4. NSVT (nonsustained ventricular tachycardia)   5. Coronary atherosclerosis of native coronary artery    Orders Placed This Encounter  Procedures  . CMP14+EGFR  . NMR, lipoprofile  . POCT glycosylated hemoglobin (Hb A1C)   Meds ordered this encounter  Medications  . sitaGLIPtin-metformin (JANUMET) 50-1000 MG per tablet    Sig: Take 1 tablet by mouth 2 (two) times daily with a meal.    Dispense:  180 tablet    Refill:  1    Order Specific Question:  Supervising Provider    Answer:  Chipper Herb [[id number]]  . lisinopril (PRINIVIL,ZESTRIL) 5 MG tablet    Sig: Take 1 tablet (5 mg total) by mouth daily.    Dispense:  90 tablet    Refill:  3    Order Specific Question:  Supervising Provider    Answer:  Chipper Herb [[id number]]  . glimepiride (AMARYL) 4 MG tablet    Sig: Take 1 tablet (4 mg total) by mouth 2 (two) times daily.    Dispense:  180 tablet    Refill:  1    Order Specific Question:  Supervising Provider    Answer:  Chipper Herb [[id number]]  . metoprolol tartrate (LOPRESSOR) 25 MG tablet    Sig: Take 1 tablet (25 mg total) by mouth 2 (two) times daily.    Dispense:  180 tablet    Refill:  1    Order Specific Question:  Supervising Provider    Answer:  MCDOWELL, SAMUEL G [[id number]]  . rosuvastatin (CRESTOR) 40 MG  tablet    Sig: Take 0.5 tablets (20 mg total) by mouth daily.    Dispense:  90 tablet    Refill:  1    Order Specific Question:  Supervising Provider    Answer:  MCDOWELL, SAMUEL G [[id number]]  . ticagrelor (BRILINTA) 90 MG TABS tablet    Sig: Take 1 tablet (90 mg total) by mouth 2 (two) times daily.    Dispense:  180 tablet    Refill:  1    Order Specific Question:  Supervising Provider    Answer:  MCDOWELL, Aloha Gell [[id number]]    Labs pending Health maintenance reviewed Diet and exercise encouraged Continue all meds Follow up  In 3 months   [place], [name]

## 2013-09-20 ENCOUNTER — Telehealth: Payer: Self-pay | Admitting: Family Medicine

## 2013-09-20 LAB — CMP14+EGFR
ALT: 20 IU/L (ref 0–44)
AST: 22 IU/L (ref 0–40)
Albumin/Globulin Ratio: 1.8 (ref 1.1–2.5)
Albumin: 4.4 g/dL (ref 3.5–5.5)
Alkaline Phosphatase: 58 IU/L (ref 39–117)
BUN/Creatinine Ratio: 18 (ref 9–20)
BUN: 12 mg/dL (ref 6–24)
CHLORIDE: 100 mmol/L (ref 97–108)
CO2: 25 mmol/L (ref 18–29)
Calcium: 9.9 mg/dL (ref 8.7–10.2)
Creatinine, Ser: 0.65 mg/dL — ABNORMAL LOW (ref 0.76–1.27)
GFR calc Af Amer: 124 mL/min/{1.73_m2} (ref >59)
GFR calc non Af Amer: 107 mL/min/{1.73_m2} (ref >59)
GLUCOSE: 212 mg/dL — AB (ref 65–99)
Globulin, Total: 2.4 g/dL (ref 1.5–4.5)
Potassium: 5.4 mmol/L — ABNORMAL HIGH (ref 3.5–5.2)
SODIUM: 142 mmol/L (ref 134–144)
Total Bilirubin: 0.4 mg/dL (ref 0.0–1.2)
Total Protein: 6.8 g/dL (ref 6.0–8.5)

## 2013-09-20 LAB — NMR, LIPOPROFILE
Cholesterol: 135 mg/dL (ref <200)
HDL Cholesterol by NMR: 23 mg/dL — ABNORMAL LOW (ref >=40)
HDL Particle Number: 24.7 umol/L — ABNORMAL LOW (ref >=30.5)
LDL PARTICLE NUMBER: 1048 nmol/L — AB (ref <1000)
LDL Size: 19.6 nm (ref >20.5)
LDLC SERPL CALC-MCNC: 43 mg/dL (ref <100)
LP-IR Score: 77 — ABNORMAL HIGH (ref <=45)
SMALL LDL PARTICLE NUMBER: 849 nmol/L — AB (ref <=527)
Triglycerides by NMR: 347 mg/dL — ABNORMAL HIGH (ref <150)

## 2013-09-20 NOTE — Telephone Encounter (Signed)
Message copied by Azalee Course on Wed Sep 20, 2013  2:27 PM ------      Message from: Bennie Pierini      Created: Wed Sep 20, 2013  2:07 PM       Kidney and liver function stable      ldl good but trig are elevated- watch carbs in diet and if no better in 3 months will need to add meds      Continue current meds- low fat diet and exercise and recheck in 3 months       ------

## 2013-10-03 NOTE — Telephone Encounter (Signed)
Message copied by Doreatha Massed on Tue Oct 03, 2013  3:04 PM ------      Message from: Bennie Pierini      Created: Wed Sep 20, 2013  2:07 PM       Kidney and liver function stable      ldl good but trig are elevated- watch carbs in diet and if no better in 3 months will need to add meds      Continue current meds- low fat diet and exercise and recheck in 3 months       ------

## 2013-11-27 ENCOUNTER — Telehealth: Payer: Self-pay | Admitting: Cardiology

## 2013-11-27 NOTE — Telephone Encounter (Signed)
Needs to know if patient needs to be pre medicated for cleaning of teeth.  Patient is there now.  Please call

## 2013-11-27 NOTE — Telephone Encounter (Signed)
Does this patient need SBE prophylaxis prior to dental work?

## 2013-11-28 NOTE — Telephone Encounter (Signed)
No SBE prophylaxis is needed.

## 2013-11-28 NOTE — Telephone Encounter (Signed)
[facility] notified patient does not need SBE prophylaxis per Dr. Antoine Poche.  Voiced understanding.

## 2013-12-25 ENCOUNTER — Encounter: Payer: Self-pay | Admitting: Nurse Practitioner

## 2013-12-25 ENCOUNTER — Ambulatory Visit (INDEPENDENT_AMBULATORY_CARE_PROVIDER_SITE_OTHER): Payer: BC Managed Care – PPO | Admitting: Nurse Practitioner

## 2013-12-25 VITALS — BP 154/84 | HR 72 | Temp 97.4°F | Ht 69.0 in | Wt 239.0 lb

## 2013-12-25 DIAGNOSIS — I25111 Atherosclerotic heart disease of native coronary artery with angina pectoris with documented spasm: Secondary | ICD-10-CM

## 2013-12-25 DIAGNOSIS — I209 Angina pectoris, unspecified: Secondary | ICD-10-CM

## 2013-12-25 DIAGNOSIS — I10 Essential (primary) hypertension: Secondary | ICD-10-CM

## 2013-12-25 DIAGNOSIS — I2510 Atherosclerotic heart disease of native coronary artery without angina pectoris: Secondary | ICD-10-CM

## 2013-12-25 DIAGNOSIS — E119 Type 2 diabetes mellitus without complications: Secondary | ICD-10-CM

## 2013-12-25 DIAGNOSIS — I2119 ST elevation (STEMI) myocardial infarction involving other coronary artery of inferior wall: Secondary | ICD-10-CM

## 2013-12-25 DIAGNOSIS — E785 Hyperlipidemia, unspecified: Secondary | ICD-10-CM

## 2013-12-25 LAB — POCT GLYCOSYLATED HEMOGLOBIN (HGB A1C): Hemoglobin A1C: 7.9

## 2013-12-25 NOTE — Patient Instructions (Signed)

## 2013-12-25 NOTE — Progress Notes (Signed)
Subjective:    Patient ID: ABDI HUSAK, male    DOB: 14-May-1956, 58 y.o.   MRN: [id number]  Patient here today for follow up- no changes since last visit.  Hyperlipidemia This is a chronic problem. The current episode started more than 1 year ago. The problem is uncontrolled. Recent lipid tests were reviewed and are high. There are no known factors aggravating his hyperlipidemia. Pertinent negatives include no leg pain, myalgias or shortness of breath. Current antihyperlipidemic treatment includes statins and fibric acid derivatives. The current treatment provides moderate improvement of lipids. Compliance problems include adherence to diet and adherence to exercise.  Risk factors for coronary artery disease include hypertension and male sex.  Hypertension This is a chronic problem. The current episode started more than 1 year ago. The problem has been waxing and waning since onset. The problem is uncontrolled. Pertinent negatives include no blurred vision, headaches, malaise/fatigue, palpitations, peripheral edema, shortness of breath or sweats. There are no associated agents to hypertension. Risk factors for coronary artery disease include diabetes mellitus, dyslipidemia and male gender. Past treatments include calcium channel blockers and ACE inhibitors (not taking ace like suppose to- didn't realize he needed to stay on it.). The current treatment provides moderate improvement. Compliance problems include diet and exercise.   Diabetes He presents for his follow-up diabetic visit. He has type 2 diabetes mellitus. No MedicAlert identification noted. The initial diagnosis of diabetes was made 3 years ago. His disease course has been stable. There are no hypoglycemic associated symptoms. Pertinent negatives for hypoglycemia include no headaches or sweats. Pertinent negatives for diabetes include no blurred vision, no fatigue, no foot ulcerations, no polydipsia, no polyphagia, no polyuria, no visual  change and no weight loss. There are no hypoglycemic complications. Symptoms are stable. There are no diabetic complications. Risk factors for coronary artery disease include diabetes mellitus, dyslipidemia, hypertension, male sex and obesity. Current diabetic treatment includes oral agent (dual therapy) and diet. He is compliant with treatment most of the time. He is following a generally healthy diet. When asked about meal planning, he reported none. He has not had a previous visit with a dietician. He rarely participates in exercise. His home blood glucose trend is fluctuating minimally. His breakfast blood glucose is taken between 8-9 am. His breakfast blood glucose range is generally 90-110 mg/dl. An ACE inhibitor/angiotensin II receptor blocker is not being taken. He does not see a podiatrist.Eye exam is current (03/2012).  Hx of stent placement Is doing well since stent placement. No c/o shortness of breath  Review of Systems  Constitutional: Negative for weight loss, malaise/fatigue and fatigue.  Eyes: Negative for blurred vision.  Respiratory: Negative for shortness of breath.   Cardiovascular: Negative for palpitations.  Endocrine: Negative for polydipsia, polyphagia and polyuria.  Musculoskeletal: Negative for myalgias.  Neurological: Negative for headaches.  All other systems reviewed and are negative.      Objective:   Physical Exam  Constitutional: He is oriented to person, place, and time. He appears well-developed and well-nourished.  HENT:  Head: Normocephalic.  Right Ear: External ear normal.  Left Ear: External ear normal.  Nose: Nose normal.  Mouth/Throat: Oropharynx is clear and moist.  Eyes: EOM are normal. Pupils are equal, round, and reactive to light.  Neck: Normal range of motion. Neck supple. No thyromegaly present.  Cardiovascular: Normal rate, regular rhythm, normal heart sounds and intact distal pulses.   No murmur heard. Pulmonary/Chest: Effort normal and  breath sounds normal. He has no  wheezes. He has no rales.  Abdominal: Soft. Bowel sounds are normal.  Genitourinary:  Refuses rectal exam  Musculoskeletal: Normal range of motion.  Neurological: He is alert and oriented to person, place, and time.  Skin: Skin is warm and dry.  Psychiatric: He has a normal mood and affect. His behavior is normal. Judgment and thought content normal.   BP 154/84  Pulse 72  Temp(Src) 97.4 F (36.3 C) (Oral)  Ht _0  (1.753 m)  Wt 239 lb (108.41 kg)  BMI 35.28 kg/m2  Results for orders placed in visit on 12/25/13  POCT GLYCOSYLATED HEMOGLOBIN (HGB A1C)      Result Value Ref Range   Hemoglobin A1C 7.9%        Assessment & Plan:   1. Type 2 diabetes mellitus without complication   2. Essential hypertension   3. Hyperlipidemia   4. Atherosclerosis of native coronary artery of native heart with angina pectoris with documented spasm   5. ST elevation myocardial infarction (STEMI) of inferolateral wall    Orders Placed This Encounter  Procedures  . CMP14+EGFR  . NMR, lipoprofile  . POCT glycosylated hemoglobin (Hb A1C)   Meds ordered this encounter  Medications  . Choline Fenofibrate (FENOFIBRIC ACID) 135 MG CPDR    Sig: Take by mouth.   Stricter low carb diet- patient is usually doing well with diabetes Labs pending Health maintenance reviewed Diet and exercise encouraged Continue all meds Follow up  In 3 months   [place], [name]

## 2013-12-26 ENCOUNTER — Telehealth: Payer: Self-pay | Admitting: Family Medicine

## 2013-12-26 LAB — NMR, LIPOPROFILE
Cholesterol: 193 mg/dL (ref 100–199)
HDL CHOLESTEROL BY NMR: 21 mg/dL — AB (ref >39)
HDL Particle Number: 25.8 umol/L — ABNORMAL LOW (ref >=30.5)
LDL Particle Number: 2115 nmol/L — ABNORMAL HIGH (ref <1000)
LDL Size: 19.6 nm (ref >20.5)
LDLC SERPL CALC-MCNC: 105 mg/dL — ABNORMAL HIGH (ref 0–99)
LP-IR Score: 71 — ABNORMAL HIGH (ref <=45)
SMALL LDL PARTICLE NUMBER: 1813 nmol/L — AB (ref <=527)
TRIGLYCERIDES BY NMR: 336 mg/dL — AB (ref 0–149)

## 2013-12-26 LAB — CMP14+EGFR
A/G RATIO: 1.9 (ref 1.1–2.5)
ALBUMIN: 4.7 g/dL (ref 3.5–5.5)
ALT: 39 IU/L (ref 0–44)
AST: 38 IU/L (ref 0–40)
Alkaline Phosphatase: 50 IU/L (ref 39–117)
BUN/Creatinine Ratio: 13 (ref 9–20)
BUN: 12 mg/dL (ref 6–24)
CALCIUM: 10.2 mg/dL (ref 8.7–10.2)
CO2: 25 mmol/L (ref 18–29)
Chloride: 96 mmol/L — ABNORMAL LOW (ref 97–108)
Creatinine, Ser: 0.91 mg/dL (ref 0.76–1.27)
GFR calc Af Amer: 106 mL/min/{1.73_m2} (ref >59)
GFR, EST NON AFRICAN AMERICAN: 92 mL/min/{1.73_m2} (ref >59)
GLUCOSE: 171 mg/dL — AB (ref 65–99)
Globulin, Total: 2.5 g/dL (ref 1.5–4.5)
POTASSIUM: 5.8 mmol/L — AB (ref 3.5–5.2)
Sodium: 138 mmol/L (ref 134–144)
TOTAL PROTEIN: 7.2 g/dL (ref 6.0–8.5)
Total Bilirubin: 0.3 mg/dL (ref 0.0–1.2)

## 2013-12-26 NOTE — Telephone Encounter (Signed)
Message copied by Azalee Course on Tue Dec 26, 2013  9:59 AM ------      Message from: Bennie Pierini      Created: Tue Dec 26, 2013  8:16 AM       Hgba1c discussed at appointment      Blood sugar elevated      K+ level elevated- need to recheck- please come in to have redrawn      Cholesterol is almost doubled from last check- have been taking statin daily?       ------

## 2013-12-28 ENCOUNTER — Other Ambulatory Visit (INDEPENDENT_AMBULATORY_CARE_PROVIDER_SITE_OTHER): Payer: BC Managed Care – PPO

## 2013-12-28 DIAGNOSIS — I4729 Other ventricular tachycardia: Secondary | ICD-10-CM

## 2013-12-28 DIAGNOSIS — I472 Ventricular tachycardia: Secondary | ICD-10-CM

## 2013-12-28 NOTE — Progress Notes (Signed)
Pt came in for labs only 

## 2013-12-29 LAB — BASIC METABOLIC PANEL WITH GFR
BUN: 19 mg/dL (ref 6–23)
CHLORIDE: 100 meq/L (ref 96–112)
CO2: 26 mEq/L (ref 19–32)
Calcium: 9.9 mg/dL (ref 8.4–10.5)
Creat: 0.97 mg/dL (ref 0.50–1.35)
GFR, Est African American: >89 mL/min
GFR, Est Non African American: 85 mL/min
GLUCOSE: 176 mg/dL — AB (ref 70–99)
Potassium: 5.3 mEq/L (ref 3.5–5.3)
Sodium: 136 mEq/L (ref 135–145)

## 2014-01-02 ENCOUNTER — Encounter: Payer: Self-pay | Admitting: Family Medicine

## 2014-04-05 ENCOUNTER — Ambulatory Visit (INDEPENDENT_AMBULATORY_CARE_PROVIDER_SITE_OTHER): Payer: BC Managed Care – PPO | Admitting: Nurse Practitioner

## 2014-04-05 ENCOUNTER — Encounter: Payer: Self-pay | Admitting: Nurse Practitioner

## 2014-04-05 ENCOUNTER — Ambulatory Visit (INDEPENDENT_AMBULATORY_CARE_PROVIDER_SITE_OTHER): Payer: BC Managed Care – PPO | Admitting: *Deleted

## 2014-04-05 DIAGNOSIS — I10 Essential (primary) hypertension: Secondary | ICD-10-CM

## 2014-04-05 DIAGNOSIS — E119 Type 2 diabetes mellitus without complications: Secondary | ICD-10-CM

## 2014-04-05 DIAGNOSIS — I472 Ventricular tachycardia: Secondary | ICD-10-CM

## 2014-04-05 DIAGNOSIS — E785 Hyperlipidemia, unspecified: Secondary | ICD-10-CM

## 2014-04-05 DIAGNOSIS — Z23 Encounter for immunization: Secondary | ICD-10-CM

## 2014-04-05 DIAGNOSIS — E1159 Type 2 diabetes mellitus with other circulatory complications: Secondary | ICD-10-CM

## 2014-04-05 DIAGNOSIS — I4729 Other ventricular tachycardia: Secondary | ICD-10-CM

## 2014-04-05 LAB — POCT GLYCOSYLATED HEMOGLOBIN (HGB A1C): Hemoglobin A1C: 8.2

## 2014-04-05 LAB — POCT UA - MICROALBUMIN: Microalbumin Ur, POC: 20 mg/L

## 2014-04-05 MED ORDER — ROSUVASTATIN CALCIUM 40 MG PO TABS: 20.0000 mg | ORAL_TABLET | Freq: Every day | ORAL | Status: DC

## 2014-04-05 MED ORDER — METOPROLOL TARTRATE 25 MG PO TABS: 25.0000 mg | ORAL_TABLET | Freq: Two times a day (BID) | ORAL | Status: DC

## 2014-04-05 MED ORDER — FENOFIBRIC ACID 135 MG PO CPDR: 1.0000 | DELAYED_RELEASE_CAPSULE | Freq: Every day | ORAL | Status: DC

## 2014-04-05 MED ORDER — GLIMEPIRIDE 4 MG PO TABS: 4.0000 mg | ORAL_TABLET | Freq: Two times a day (BID) | ORAL | Status: DC

## 2014-04-05 MED ORDER — SITAGLIPTIN PHOS-METFORMIN HCL 50-1000 MG PO TABS: 1.0000 | ORAL_TABLET | Freq: Two times a day (BID) | ORAL | Status: DC

## 2014-04-05 MED ORDER — LISINOPRIL 10 MG PO TABS: 10.0000 mg | ORAL_TABLET | Freq: Every day | ORAL | Status: DC

## 2014-04-05 NOTE — Addendum Note (Signed)
Addended by: Orma Render F on: 04/05/2014 09:13 AM   Modules accepted: Orders

## 2014-04-05 NOTE — Progress Notes (Signed)
Subjective:    Patient ID: Tyler Garner, male    DOB: [birth year], 58 y.o.   MRN: [id number]  Patient here today for follow up- no changes since last visit.  Hyperlipidemia This is a chronic problem. The current episode started more than 1 year ago. The problem is uncontrolled. Recent lipid tests were reviewed and are high. Exacerbating diseases include diabetes and obesity. He has no history of hypothyroidism. Pertinent negatives include no myalgias or shortness of breath. Current antihyperlipidemic treatment includes statins. The current treatment provides mild improvement of lipids. Compliance problems include adherence to diet and adherence to exercise.  Risk factors for coronary artery disease include dyslipidemia, family history, hypertension, male sex and obesity.  Hypertension This is a chronic problem. The problem has been waxing and waning since onset. The problem is uncontrolled. Pertinent negatives include no headaches, palpitations or shortness of breath. Risk factors for coronary artery disease include obesity, male gender, dyslipidemia and diabetes mellitus. Past treatments include ACE inhibitors and beta blockers. Hypertensive end-organ damage includes CVA (stent placement heart).  Diabetes He presents for his follow-up diabetic visit. He has type 2 diabetes mellitus. No MedicAlert identification noted. His disease course has been fluctuating. Pertinent negatives for hypoglycemia include no headaches. Pertinent negatives for diabetes include no fatigue, no polydipsia, no polyphagia, no polyuria and no visual change. Diabetic complications include a CVA (stent placement heart). Current diabetic treatment includes oral agent (triple therapy). He is compliant with treatment most of the time. His weight is stable. He is following a high fat/cholesterol diet. When asked about meal planning, he reported none. He has not had a previous visit with a dietitian. He rarely participates in exercise.  There is no change in his home blood glucose trend. His breakfast blood glucose is taken between 8-9 am. His breakfast blood glucose range is generally 110-130 mg/dl. His highest blood glucose is 180-200 mg/dl. His overall blood glucose range is 110-130 mg/dl. An ACE inhibitor/angiotensin II receptor blocker is being taken. He does not see a podiatrist.Eye exam is not current.  Hx of stent placement Is doing well since stent placement. No c/o shortness of breath  * c/o frequent dysuria  Review of Systems  Constitutional: Negative for fatigue.  Respiratory: Negative for shortness of breath.   Cardiovascular: Negative for palpitations.  Endocrine: Negative for polydipsia, polyphagia and polyuria.  Genitourinary: Positive for dysuria. Negative for urgency, hematuria and penile swelling.  Musculoskeletal: Negative for myalgias.  Neurological: Negative for headaches.  All other systems reviewed and are negative.      Objective:   Physical Exam  Constitutional: He is oriented to person, place, and time. He appears well-developed and well-nourished.  HENT:  Head: Normocephalic.  Right Ear: External ear normal.  Left Ear: External ear normal.  Nose: Nose normal.  Mouth/Throat: Oropharynx is clear and moist.  Eyes: EOM are normal. Pupils are equal, round, and reactive to light.  Neck: Normal range of motion. Neck supple. No JVD present. No thyromegaly present.  Cardiovascular: Normal rate, regular rhythm, normal heart sounds and intact distal pulses.  Exam reveals no gallop and no friction rub.   No murmur heard. Pulmonary/Chest: Effort normal and breath sounds normal. No respiratory distress. He has no wheezes. He has no rales. He exhibits no tenderness.  Abdominal: Soft. Bowel sounds are normal. He exhibits no mass. There is no tenderness.  Genitourinary:  Refuses rectal exam  Musculoskeletal: Normal range of motion. He exhibits no edema.  Lymphadenopathy:    He has  no cervical  adenopathy.  Neurological: He is alert and oriented to person, place, and time. No cranial nerve deficit.  Skin: Skin is warm and dry.  Psychiatric: He has a normal mood and affect. His behavior is normal. Judgment and thought content normal.   BP 155/91 mmHg  Pulse 76  Temp(Src) 97.3 F (36.3 C) (Oral)  Ht _0  (1.753 m)  Wt 241 lb (109.317 kg)  BMI 35.57 kg/m2  Results for orders placed or performed in visit on 04/05/14  POCT glycosylated hemoglobin (Hb A1C)  Result Value Ref Range   Hemoglobin A1C 8.2       Assessment & Plan:   1. Essential hypertension Low NA diet - CMP14+EGFR  2. Hyperlipidemia Low fat diet - NMR, lipoprofile - rosuvastatin (CRESTOR) 40 MG tablet; Take 0.5 tablets (20 mg total) by mouth daily.  Dispense: 90 tablet; Refill: 1 - Choline Fenofibrate (FENOFIBRIC ACID) 135 MG CPDR; Take 1 tablet by mouth daily.  Dispense: 30 capsule; Refill: 5  3. Type 2 diabetes mellitus with other circulatory complications Stricter carb counting- will let him try diet for 3  More months - POCT glycosylated hemoglobin (Hb A1C) - glimepiride (AMARYL) 4 MG tablet; Take 1 tablet (4 mg total) by mouth 2 (two) times daily.  Dispense: 180 tablet; Refill: 1 - sitaGLIPtin-metformin (JANUMET) 50-1000 MG per tablet; Take 1 tablet by mouth 2 (two) times daily with a meal.  Dispense: 180 tablet; Refill: 1  4. NSVT (nonsustained ventricular tachycardia) Keep cardiologist - metoprolol tartrate (LOPRESSOR) 25 MG tablet; Take 1 tablet (25 mg total) by mouth 2 (two) times daily.  Dispense: 180 tablet; Refill: 1    Labs pending Health maintenance reviewed Diet and exercise encouraged Continue all meds Follow up  In 3 months   [place], [name]

## 2014-04-05 NOTE — Patient Instructions (Signed)

## 2014-04-06 LAB — CMP14+EGFR
ALBUMIN: 4.8 g/dL (ref 3.5–5.5)
ALK PHOS: 64 IU/L (ref 39–117)
ALT: 39 IU/L (ref 0–44)
AST: 55 IU/L — ABNORMAL HIGH (ref 0–40)
Albumin/Globulin Ratio: 2.2 (ref 1.1–2.5)
BILIRUBIN TOTAL: 0.4 mg/dL (ref 0.0–1.2)
BUN / CREAT RATIO: 14 (ref 9–20)
BUN: 10 mg/dL (ref 6–24)
CO2: 24 mmol/L (ref 18–29)
Calcium: 10 mg/dL (ref 8.7–10.2)
Chloride: 97 mmol/L (ref 97–108)
Creatinine, Ser: 0.73 mg/dL — ABNORMAL LOW (ref 0.76–1.27)
GFR calc non Af Amer: 102 mL/min/{1.73_m2} (ref >59)
GFR, EST AFRICAN AMERICAN: 117 mL/min/{1.73_m2} (ref >59)
GLOBULIN, TOTAL: 2.2 g/dL (ref 1.5–4.5)
Glucose: 194 mg/dL — ABNORMAL HIGH (ref 65–99)
Potassium: 5.1 mmol/L (ref 3.5–5.2)
Sodium: 138 mmol/L (ref 134–144)
Total Protein: 7 g/dL (ref 6.0–8.5)

## 2014-04-06 LAB — NMR, LIPOPROFILE
CHOLESTEROL: 146 mg/dL (ref 100–199)
HDL Cholesterol by NMR: 25 mg/dL — ABNORMAL LOW (ref >39)
HDL Particle Number: 27.1 umol/L — ABNORMAL LOW (ref >=30.5)
LDL Particle Number: 999 nmol/L (ref <1000)
LDL SIZE: 19.6 nm (ref >20.5)
LDL-C: 63 mg/dL (ref 0–99)
LP-IR SCORE: 51 — AB (ref <=45)
Small LDL Particle Number: 765 nmol/L — ABNORMAL HIGH (ref <=527)
Triglycerides by NMR: 289 mg/dL — ABNORMAL HIGH (ref 0–149)

## 2014-04-06 LAB — MICROALBUMIN, URINE: Microalbumin, Urine: 75.3 ug/mL — ABNORMAL HIGH (ref 0.0–17.0)

## 2014-04-09 ENCOUNTER — Telehealth: Payer: Self-pay | Admitting: Family Medicine

## 2014-04-09 NOTE — Telephone Encounter (Signed)
-----   Message from [facility], [name] sent at 04/07/2014 11:59 AM EST ----- Diabetes is out of control as we discussed- microalbumin is elevateddue to that as weel- kidney function is ok for now but if doesn't get under control will eventually affect kidneys- patitent did not want to change meds at appointment so watch carbs closely- trig are u as well which can come form elevated blood sugar LDL are good- recheck in 3 months

## 2014-04-13 ENCOUNTER — Telehealth: Payer: Self-pay

## 2014-04-13 NOTE — Telephone Encounter (Signed)
-----   Message from [name] Martin, [name] sent at 04/07/2014 11:59 AM EST ----- °Diabetes is out of control as we discussed- microalbumin is elevateddue to that as weel- kidney function is ok for now but if doesn't get under control will eventually affect kidneys- patitent did not want to change meds at appointment so watch carbs closely- trig are u as well which can come form elevated blood sugar °LDL are good- recheck in 3 months °

## 2014-04-13 NOTE — Telephone Encounter (Signed)
LM with lab results on home am as per DPR of 08/2012

## 2014-04-16 NOTE — Telephone Encounter (Signed)
Patient aware.

## 2014-05-02 ENCOUNTER — Encounter: Payer: Self-pay | Admitting: Cardiology

## 2014-05-02 ENCOUNTER — Ambulatory Visit (INDEPENDENT_AMBULATORY_CARE_PROVIDER_SITE_OTHER): Payer: BC Managed Care – PPO | Admitting: Cardiology

## 2014-05-02 VITALS — BP 138/88 | HR 79 | Ht 69.0 in | Wt 234.0 lb

## 2014-05-02 DIAGNOSIS — I25111 Atherosclerotic heart disease of native coronary artery with angina pectoris with documented spasm: Secondary | ICD-10-CM

## 2014-05-02 DIAGNOSIS — I2119 ST elevation (STEMI) myocardial infarction involving other coronary artery of inferior wall: Secondary | ICD-10-CM

## 2014-05-02 DIAGNOSIS — I10 Essential (primary) hypertension: Secondary | ICD-10-CM

## 2014-05-02 MED ORDER — CLOPIDOGREL BISULFATE 75 MG PO TABS: 75.0000 mg | ORAL_TABLET | Freq: Every day | ORAL | Status: DC

## 2014-05-02 NOTE — Progress Notes (Signed)
HPI The patient presents for followup of coronary artery disease with myocardial infarction in January with drug-eluting stent placement to the circumflex.  He has done well since I saw him. He has had some dyspnea which she thinks might be related to his Brilinta.  However, he's not had any palpitations, presyncope or syncope. He has not had any chest pressure, neck or arm discomfort. He has had no weight gain or edema. He's actually lost weight through exercise and reducing his eating. He has been doing a lot of hunting.  Allergies  Allergen Reactions  . Percocet [Oxycodone-Acetaminophen] Nausea And Vomiting    Current Outpatient Prescriptions  Medication Sig Dispense Refill  . aspirin 81 MG tablet Take 81 mg by mouth daily.      . Choline Fenofibrate (FENOFIBRIC ACID) 135 MG CPDR Take 1 tablet by mouth daily. 30 capsule 5  . glimepiride (AMARYL) 4 MG tablet Take 1 tablet (4 mg total) by mouth 2 (two) times daily. 180 tablet 1  . lisinopril (PRINIVIL,ZESTRIL) 10 MG tablet Take 1 tablet (10 mg total) by mouth daily. 90 tablet 1  . metoprolol tartrate (LOPRESSOR) 25 MG tablet Take 1 tablet (25 mg total) by mouth 2 (two) times daily. 180 tablet 1  . rosuvastatin (CRESTOR) 40 MG tablet Take 0.5 tablets (20 mg total) by mouth daily. 90 tablet 1  . sitaGLIPtin-metformin (JANUMET) 50-1000 MG per tablet Take 1 tablet by mouth 2 (two) times daily with a meal. 180 tablet 1  . ticagrelor (BRILINTA) 90 MG TABS tablet Take 1 tablet (90 mg total) by mouth 2 (two) times daily. 180 tablet 1   No current facility-administered medications for this visit.    Past Medical History  Diagnosis Date  . Hypertension   . Hyperglycemia   . Nicotine addiction   . Hyperlipidemia   . Renal colic   . Right shoulder injury 10/11/09  . Diabetes mellitus without complication   . CAD (coronary artery disease)     a. inferior STEMI (05/2013) with RV involvement- LHC:  dist CFX occluded (Promus premier (2.5x24 mm)  DES); EF 40-45% with inf HK;  b. Echo (report pending) with EF 45-50%, lat WMA, RVE, mod RV hypokinesis   . Ischemic cardiomyopathy     Past Surgical History  Procedure Laterality Date  .  heart stent      ROS:  As stated in the HPI and negative for all other systems.  PHYSICAL EXAM BP 138/88 mmHg  Pulse 79  Ht 5\' 9"  (1.753 m)  Wt 234 lb (106.142 kg)  BMI 34.54 kg/m2 GENERAL:  Well appearing HEENT:  Pupils equal round and reactive, fundi not visualized, oral mucosa unremarkable, poor dentition. NECK:  No jugular venous distention, waveform within normal limits, carotid upstroke brisk and symmetric, no bruits, no thyromegaly LYMPHATICS:  No cervical, inguinal adenopathy LUNGS:  Clear to auscultation bilaterally BACK:  No CVA tenderness CHEST:  Unremarkable HEART:  PMI not displaced or sustained,S1 and S2 within normal limits, no S3, no S4, no clicks, no rubs, no murmurs ABD:  Flat, positive bowel sounds normal in frequency in pitch, no bruits, no rebound, no guarding, no midline pulsatile mass, no hepatomegaly, no splenomegaly EXT:  2 plus pulses throughout, no edema, no cyanosis no clubbing SKIN:  No rashes no nodules NEURO:  Cranial nerves II through XII grossly intact, motor grossly intact throughout PSYCH:  Cognitively intact, oriented to person place and time   EKG:  Sinus rhythm, rate 79, axis within  normal limits, intervals within normal limits, no acute ST-T wave changes.  Old inferior infract.  05/02/2014   ASSESSMENT AND PLAN  CAD: The patient has no new sypmtoms.  No further cardiovascular testing is indicated.  Because of his dyspnea I would like however to stop his Brilinta and start Plavix.  He acute nature of this presentation for now I will continue DAPT.   ISCHEMIC CARDIOMYOPATHY (EF 45 - 50%) I will follow up an echocardiogram next year when I see him back.  OVERWEIGHT: He has lost some weight and I applauded his. He is going to continue with diet and  exercise and we talked about this.  DYSLIPIDEMIA:  With his change in diet and probably can reach target LDL with his current medications.. Lab Results  Component Value Date   CHOL 146 04/05/2014   TRIG 289* 04/05/2014   HDL 25* 04/05/2014   LDLCALC 105* 12/25/2013

## 2014-05-02 NOTE — Patient Instructions (Addendum)
Please stop your Brilinta. Start Plavix 75 mg a day. Continue all other medications as listed.  Follow up in 1 year with Dr. Antoine Poche.  You will receive a letter in the mail 2 months before you are due.  Please call us when you receive this letter to schedule your follow up appointment.

## 2014-05-03 ENCOUNTER — Encounter (HOSPITAL_COMMUNITY): Payer: Self-pay | Admitting: Cardiology

## 2014-07-18 ENCOUNTER — Ambulatory Visit (INDEPENDENT_AMBULATORY_CARE_PROVIDER_SITE_OTHER): Payer: BLUE CROSS/BLUE SHIELD | Admitting: Nurse Practitioner

## 2014-07-18 ENCOUNTER — Encounter: Payer: Self-pay | Admitting: Nurse Practitioner

## 2014-07-18 VITALS — BP 120/77 | HR 70 | Temp 97.5°F | Ht 69.0 in | Wt 234.0 lb

## 2014-07-18 DIAGNOSIS — I10 Essential (primary) hypertension: Secondary | ICD-10-CM

## 2014-07-18 DIAGNOSIS — E1159 Type 2 diabetes mellitus with other circulatory complications: Secondary | ICD-10-CM

## 2014-07-18 DIAGNOSIS — E1165 Type 2 diabetes mellitus with hyperglycemia: Secondary | ICD-10-CM | POA: Insufficient documentation

## 2014-07-18 DIAGNOSIS — E785 Hyperlipidemia, unspecified: Secondary | ICD-10-CM

## 2014-07-18 DIAGNOSIS — IMO0002 Reserved for concepts with insufficient information to code with codable children: Secondary | ICD-10-CM | POA: Insufficient documentation

## 2014-07-18 LAB — POCT GLYCOSYLATED HEMOGLOBIN (HGB A1C): HEMOGLOBIN A1C: 8

## 2014-07-18 NOTE — Progress Notes (Signed)
Subjective:    Patient ID: Tyler Garner, male    DOB: 04/25/1956, 59 y.o.   MRN: [id number]   Patient here today for follow up- no acute complaint.   Hyperlipidemia This is a chronic problem. The current episode started more than 1 year ago. The problem is uncontrolled. Recent lipid tests were reviewed and are high. Exacerbating diseases include diabetes and obesity. He has no history of hypothyroidism. Pertinent negatives include no myalgias or shortness of breath. Current antihyperlipidemic treatment includes statins. The current treatment provides mild improvement of lipids. Compliance problems include adherence to diet and adherence to exercise.  Risk factors for coronary artery disease include dyslipidemia, family history, hypertension, male sex and obesity.  Hypertension This is a chronic problem. The problem has been waxing and waning since onset. The problem is uncontrolled. Pertinent negatives include no headaches, palpitations or shortness of breath. Risk factors for coronary artery disease include obesity, male gender, dyslipidemia and diabetes mellitus. Past treatments include ACE inhibitors and beta blockers. Hypertensive end-organ damage includes CVA (stent placement heart).  Diabetes He presents for his follow-up diabetic visit. He has type 2 diabetes mellitus. No MedicAlert identification noted. His disease course has been fluctuating. Pertinent negatives for hypoglycemia include no headaches. Pertinent negatives for diabetes include no fatigue, no polydipsia, no polyphagia, no polyuria and no visual change. Diabetic complications include a CVA (stent placement heart). Current diabetic treatment includes oral agent (triple therapy). He is compliant with treatment most of the time. His weight is stable. He is following a high fat/cholesterol diet. When asked about meal planning, he reported none. He has not had a previous visit with a dietitian. He rarely participates in exercise. There  is no change in his home blood glucose trend. His breakfast blood glucose is taken between 8-9 am. His breakfast blood glucose range is generally 110-130 mg/dl. His highest blood glucose is 180-200 mg/dl. His overall blood glucose range is 110-130 mg/dl. An ACE inhibitor/angiotensin II receptor blocker is being taken. He does not see a podiatrist.Eye exam is not current.  Hx of stent placement Is doing well since stent placement. No c/o shortness of breath.    Review of Systems  Constitutional: Negative.  Negative for fatigue.  HENT: Negative.   Eyes: Negative.   Respiratory: Negative.  Negative for shortness of breath.   Cardiovascular: Negative.  Negative for palpitations.  Gastrointestinal: Negative.   Endocrine: Negative.  Negative for polydipsia, polyphagia and polyuria.  Genitourinary: Negative.  Negative for urgency, hematuria and penile swelling.  Musculoskeletal: Negative.  Negative for myalgias.  Skin: Negative.   Allergic/Immunologic: Negative.   Neurological: Negative.  Negative for headaches.  Hematological: Negative.   Psychiatric/Behavioral: Negative.   All other systems reviewed and are negative.      Objective:   Physical Exam  Constitutional: He is oriented to person, place, and time. He appears well-developed and well-nourished.  HENT:  Head: Normocephalic.  Right Ear: External ear normal.  Left Ear: External ear normal.  Nose: Nose normal.  Mouth/Throat: Oropharynx is clear and moist.  Eyes: EOM are normal. Pupils are equal, round, and reactive to light.  Neck: Normal range of motion. Neck supple. No JVD present. No thyromegaly present.  Cardiovascular: Normal rate, regular rhythm, normal heart sounds and intact distal pulses.  Exam reveals no gallop and no friction rub.   No murmur heard. Pulmonary/Chest: Effort normal and breath sounds normal. No respiratory distress. He has no wheezes. He has no rales. He exhibits no tenderness.  Abdominal:  Soft. Bowel  sounds are normal. He exhibits no mass. There is no tenderness.  Genitourinary:  Refuses rectal exam  Musculoskeletal: Normal range of motion. He exhibits no edema.  Lymphadenopathy:    He has no cervical adenopathy.  Neurological: He is alert and oriented to person, place, and time. No cranial nerve deficit.  Skin: Skin is warm and dry.  Psychiatric: He has a normal mood and affect. His behavior is normal. Judgment and thought content normal.   BP 120/77 mmHg  Pulse 70  Temp(Src) 97.5 F (36.4 C) (Oral)  Ht 5' 9"  (1.753 m)  Wt 234 lb (106.142 kg)  BMI 34.54 kg/m2  Results for orders placed or performed in visit on 07/18/14  POCT glycosylated hemoglobin (Hb A1C)  Result Value Ref Range   Hemoglobin A1C 8.0%       Assessment & Plan:   1. Essential hypertension Do not add salt to diet - CMP14+EGFR  2. Hyperlipidemia Low fat diet - NMR, lipoprofile  3. Type 2 diabetes mellitus with other circulatory complications No change in meds today- strict carb counting or will need to go on insulin - POCT glycosylated hemoglobin (Hb A1C)   Encouraged to get eye exam hemoccult cards given to patient with directions Labs pending Health maintenance reviewed Diet and exercise encouraged Continue all meds Follow up  In 3 month   [place], [name]

## 2014-07-18 NOTE — Patient Instructions (Signed)
Diabetes and Exercise Exercising regularly is important. It is not just about losing weight. It has many health benefits, such as:  Improving your overall fitness, flexibility, and endurance.  Increasing your bone density.  Helping with weight control.  Decreasing your body fat.  Increasing your muscle strength.  Reducing stress and tension.  Improving your overall health. People with diabetes who exercise gain additional benefits because exercise:  Reduces appetite.  Improves the body's use of blood sugar (glucose).  Helps lower or control blood glucose.  Decreases blood pressure.  Helps control blood lipids (such as cholesterol and triglycerides).  Improves the body's use of the hormone insulin by:  Increasing the body's insulin sensitivity.  Reducing the body's insulin needs.  Decreases the risk for heart disease because exercising:  Lowers cholesterol and triglycerides levels.  Increases the levels of good cholesterol (such as high-density lipoproteins [HDL]) in the body.  Lowers blood glucose levels. YOUR ACTIVITY PLAN  Choose an activity that you enjoy and set realistic goals. Your health care provider or diabetes educator can help you make an activity plan that works for you. Exercise regularly as directed by your health care provider. This includes:  Performing resistance training twice a week such as push-ups, sit-ups, lifting weights, or using resistance bands.  Performing 150 minutes of cardio exercises each week such as walking, running, or playing sports.  Staying active and spending no more than 90 minutes at one time being inactive. Even short bursts of exercise are good for you. Three 10-minute sessions spread throughout the day are just as beneficial as a single 30-minute session. Some exercise ideas include:  Taking the dog for a walk.  Taking the stairs instead of the elevator.  Dancing to your favorite song.  Doing an exercise  video.  Doing your favorite exercise with a friend. RECOMMENDATIONS FOR EXERCISING WITH TYPE 1 OR TYPE 2 DIABETES   Check your blood glucose before exercising. If blood glucose levels are greater than 240 mg/dL, check for urine ketones. Do not exercise if ketones are present.  Avoid injecting insulin into areas of the body that are going to be exercised. For example, avoid injecting insulin into:  The arms when playing tennis.  The legs when jogging.  Keep a record of:  Food intake before and after you exercise.  Expected peak times of insulin action.  Blood glucose levels before and after you exercise.  The type and amount of exercise you have done.  Review your records with your health care provider. Your health care provider will help you to develop guidelines for adjusting food intake and insulin amounts before and after exercising.  If you take insulin or oral hypoglycemic agents, watch for signs and symptoms of hypoglycemia. They include:  Dizziness.  Shaking.  Sweating.  Chills.  Confusion.  Drink plenty of water while you exercise to prevent dehydration or heat stroke. Body water is lost during exercise and must be replaced.  Talk to your health care provider before starting an exercise program to make sure it is safe for you. Remember, almost any type of activity is better than none. Document Released: 08/01/2003 Document Revised: 09/25/2013 Document Reviewed: 10/18/2012 ExitCare Patient Information 2015 [employer]. This information is not intended to replace advice given to you by your health care provider. Make sure you discuss any questions you have with your health care provider.  

## 2014-07-19 LAB — NMR, LIPOPROFILE
Cholesterol: 160 mg/dL (ref 100–199)
HDL Cholesterol by NMR: 20 mg/dL — ABNORMAL LOW (ref >39)
HDL Particle Number: 23.2 umol/L — ABNORMAL LOW (ref >=30.5)
LDL Particle Number: 1542 nmol/L — ABNORMAL HIGH (ref <1000)
LDL Size: 19.6 nm (ref >20.5)
LDL-C: 68 mg/dL (ref 0–99)
LP-IR Score: 67 — ABNORMAL HIGH (ref <=45)
SMALL LDL PARTICLE NUMBER: 1306 nmol/L — AB (ref <=527)
Triglycerides by NMR: 360 mg/dL — ABNORMAL HIGH (ref 0–149)

## 2014-07-19 LAB — CMP14+EGFR
ALK PHOS: 47 IU/L (ref 39–117)
ALT: 31 IU/L (ref 0–44)
AST: 30 IU/L (ref 0–40)
Albumin/Globulin Ratio: 2.1 (ref 1.1–2.5)
Albumin: 4.8 g/dL (ref 3.5–5.5)
BILIRUBIN TOTAL: 0.4 mg/dL (ref 0.0–1.2)
BUN / CREAT RATIO: 15 (ref 9–20)
BUN: 15 mg/dL (ref 6–24)
CHLORIDE: 99 mmol/L (ref 97–108)
CO2: 27 mmol/L (ref 18–29)
Calcium: 10.6 mg/dL — ABNORMAL HIGH (ref 8.7–10.2)
Creatinine, Ser: 0.98 mg/dL (ref 0.76–1.27)
GFR calc Af Amer: 97 mL/min/{1.73_m2} (ref >59)
GFR calc non Af Amer: 84 mL/min/{1.73_m2} (ref >59)
GLUCOSE: 135 mg/dL — AB (ref 65–99)
Globulin, Total: 2.3 g/dL (ref 1.5–4.5)
POTASSIUM: 5.7 mmol/L — AB (ref 3.5–5.2)
SODIUM: 141 mmol/L (ref 134–144)
Total Protein: 7.1 g/dL (ref 6.0–8.5)

## 2014-10-23 ENCOUNTER — Other Ambulatory Visit: Payer: Self-pay | Admitting: Nurse Practitioner

## 2014-10-25 ENCOUNTER — Encounter: Payer: Self-pay | Admitting: Nurse Practitioner

## 2014-10-25 ENCOUNTER — Ambulatory Visit (INDEPENDENT_AMBULATORY_CARE_PROVIDER_SITE_OTHER): Payer: BLUE CROSS/BLUE SHIELD | Admitting: Nurse Practitioner

## 2014-10-25 VITALS — BP 141/82 | HR 73 | Temp 97.8°F | Ht 69.0 in | Wt 242.0 lb

## 2014-10-25 DIAGNOSIS — E1159 Type 2 diabetes mellitus with other circulatory complications: Secondary | ICD-10-CM | POA: Diagnosis not present

## 2014-10-25 DIAGNOSIS — E785 Hyperlipidemia, unspecified: Secondary | ICD-10-CM

## 2014-10-25 DIAGNOSIS — M2550 Pain in unspecified joint: Secondary | ICD-10-CM | POA: Insufficient documentation

## 2014-10-25 DIAGNOSIS — I10 Essential (primary) hypertension: Secondary | ICD-10-CM | POA: Diagnosis not present

## 2014-10-25 DIAGNOSIS — I25111 Atherosclerotic heart disease of native coronary artery with angina pectoris with documented spasm: Secondary | ICD-10-CM | POA: Diagnosis not present

## 2014-10-25 DIAGNOSIS — I4729 Other ventricular tachycardia: Secondary | ICD-10-CM

## 2014-10-25 DIAGNOSIS — I472 Ventricular tachycardia: Secondary | ICD-10-CM

## 2014-10-25 LAB — POCT GLYCOSYLATED HEMOGLOBIN (HGB A1C): Hemoglobin A1C: 8.4

## 2014-10-25 MED ORDER — ROSUVASTATIN CALCIUM 40 MG PO TABS: 20.0000 mg | ORAL_TABLET | Freq: Every day | ORAL | Status: DC

## 2014-10-25 MED ORDER — CLOPIDOGREL BISULFATE 75 MG PO TABS: 75.0000 mg | ORAL_TABLET | Freq: Every day | ORAL | Status: DC

## 2014-10-25 MED ORDER — FENOFIBRIC ACID 135 MG PO CPDR: 1.0000 | DELAYED_RELEASE_CAPSULE | Freq: Every day | ORAL | Status: DC

## 2014-10-25 MED ORDER — SITAGLIPTIN PHOS-METFORMIN HCL 50-1000 MG PO TABS: 1.0000 | ORAL_TABLET | Freq: Two times a day (BID) | ORAL | Status: DC

## 2014-10-25 MED ORDER — GLIMEPIRIDE 4 MG PO TABS: 4.0000 mg | ORAL_TABLET | Freq: Two times a day (BID) | ORAL | Status: DC

## 2014-10-25 MED ORDER — MELOXICAM 15 MG PO TABS: 15.0000 mg | ORAL_TABLET | Freq: Every day | ORAL | Status: DC

## 2014-10-25 MED ORDER — LISINOPRIL 10 MG PO TABS: 10.0000 mg | ORAL_TABLET | Freq: Every day | ORAL | Status: DC

## 2014-10-25 MED ORDER — METOPROLOL TARTRATE 25 MG PO TABS: 25.0000 mg | ORAL_TABLET | Freq: Two times a day (BID) | ORAL | Status: DC

## 2014-10-25 NOTE — Progress Notes (Signed)
Subjective:    Patient ID: Tyler Garner, male    DOB: 1955-07-31, 59 y.o.   MRN: [id number]   Patient here today for follow up- no acute complaint. C/O hand and knee pain and occasional back pain- arthritis runs in family.  Hyperlipidemia This is a chronic problem. The current episode started more than 1 year ago. The problem is uncontrolled. Recent lipid tests were reviewed and are high. Exacerbating diseases include diabetes and obesity. He has no history of hypothyroidism. Pertinent negatives include no myalgias or shortness of breath. Current antihyperlipidemic treatment includes statins. The current treatment provides mild improvement of lipids. Compliance problems include adherence to diet and adherence to exercise.  Risk factors for coronary artery disease include dyslipidemia, family history, hypertension, male sex and obesity.  Hypertension This is a chronic problem. The problem has been waxing and waning since onset. The problem is uncontrolled. Pertinent negatives include no headaches, palpitations or shortness of breath. Risk factors for coronary artery disease include obesity, male gender, dyslipidemia and diabetes mellitus. Past treatments include ACE inhibitors and beta blockers. Hypertensive end-organ damage includes CVA (stent placement heart).  Diabetes He presents for his follow-up diabetic visit. He has type 2 diabetes mellitus. No MedicAlert identification noted. His disease course has been fluctuating. Pertinent negatives for hypoglycemia include no headaches. Pertinent negatives for diabetes include no fatigue, no polydipsia, no polyphagia, no polyuria and no visual change. Diabetic complications include a CVA (stent placement heart). Current diabetic treatment includes oral agent (triple therapy). He is compliant with treatment most of the time. His weight is stable. He is following a high fat/cholesterol diet. When asked about meal planning, he reported none. He has not had a  previous visit with a dietitian. He rarely participates in exercise. There is no change in his home blood glucose trend. His breakfast blood glucose is taken between 8-9 am. His breakfast blood glucose range is generally 110-130 mg/dl. His highest blood glucose is 180-200 mg/dl. His overall blood glucose range is 110-130 mg/dl. An ACE inhibitor/angiotensin II receptor blocker is being taken. He does not see a podiatrist.Eye exam is not current.  Hx of stent placement Is doing well since stent placement. No c/o shortness of breath.    Review of Systems  Constitutional: Negative.  Negative for fatigue.  HENT: Negative.   Eyes: Negative.   Respiratory: Negative.  Negative for shortness of breath.   Cardiovascular: Negative.  Negative for palpitations.  Gastrointestinal: Negative.   Endocrine: Negative.  Negative for polydipsia, polyphagia and polyuria.  Genitourinary: Negative.  Negative for urgency, hematuria and penile swelling.  Musculoskeletal: Negative.  Negative for myalgias.  Skin: Negative.   Allergic/Immunologic: Negative.   Neurological: Negative.  Negative for headaches.  Hematological: Negative.   Psychiatric/Behavioral: Negative.   All other systems reviewed and are negative.      Objective:   Physical Exam  Constitutional: He is oriented to person, place, and time. He appears well-developed and well-nourished.  HENT:  Head: Normocephalic.  Right Ear: External ear normal.  Left Ear: External ear normal.  Nose: Nose normal.  Mouth/Throat: Oropharynx is clear and moist.  Eyes: EOM are normal. Pupils are equal, round, and reactive to light.  Neck: Normal range of motion. Neck supple. No JVD present. No thyromegaly present.  Cardiovascular: Normal rate, regular rhythm, normal heart sounds and intact distal pulses.  Exam reveals no gallop and no friction rub.   No murmur heard. Pulmonary/Chest: Effort normal and breath sounds normal. No respiratory distress. He has  no  wheezes. He has no rales. He exhibits no tenderness.  Abdominal: Soft. Bowel sounds are normal. He exhibits no mass. There is no tenderness.  Genitourinary:  Refuses rectal exam  Musculoskeletal: Normal range of motion. He exhibits no edema.  Lymphadenopathy:    He has no cervical adenopathy.  Neurological: He is alert and oriented to person, place, and time. No cranial nerve deficit.  Skin: Skin is warm and dry.  Psychiatric: He has a normal mood and affect. His behavior is normal. Judgment and thought content normal.   BP 141/82 mmHg  Pulse 73  Temp(Src) 97.8 F (36.6 C) (Oral)  Ht 5' 9"  (1.753 m)  Wt 242 lb (109.77 kg)  BMI 35.72 kg/m2  Results for orders placed or performed in visit on 10/25/14  POCT glycosylated hemoglobin (Hb A1C)  Result Value Ref Range   Hemoglobin A1C 8.4       Assessment & Plan:   1. Essential hypertension Do not add salt to diet - CMP14+EGFR - lisinopril (PRINIVIL,ZESTRIL) 10 MG tablet; Take 1 tablet (10 mg total) by mouth daily.  Dispense: 90 tablet; Refill: 1  2. Hyperlipidemia Low fat diet - NMR, lipoprofile - Choline Fenofibrate (FENOFIBRIC ACID) 135 MG CPDR; Take 1 capsule by mouth daily.  Dispense: 90 capsule; Refill: 1 - rosuvastatin (CRESTOR) 40 MG tablet; Take 0.5 tablets (20 mg total) by mouth daily.  Dispense: 90 tablet; Refill: 1  3. Type 2 diabetes mellitus with other circulatory complications doies not want to go on shots- will try diet- if hgba1c is no better at next visit will have to go on shots- maybe bydureon or victozia - POCT glycosylated hemoglobin (Hb A1C) - sitaGLIPtin-metformin (JANUMET) 50-1000 MG per tablet; Take 1 tablet by mouth 2 (two) times daily with a meal.  Dispense: 180 tablet; Refill: 1 - glimepiride (AMARYL) 4 MG tablet; Take 1 tablet (4 mg total) by mouth 2 (two) times daily.  Dispense: 180 tablet; Refill: 1  4. Atherosclerosis of native coronary artery of native heart with angina pectoris with documented  spasm - clopidogrel (PLAVIX) 75 MG tablet; Take 1 tablet (75 mg total) by mouth daily.  Dispense: 90 tablet; Refill: 1  5. NSVT (nonsustained ventricular tachycardia) - metoprolol tartrate (LOPRESSOR) 25 MG tablet; Take 1 tablet (25 mg total) by mouth 2 (two) times daily.  Dispense: 180 tablet; Refill: 1  6. Arthritic pain - mobic 22m 1 po qd #30 5 refills   Labs pending Health maintenance reviewed Diet and exercise encouraged Continue all meds Follow up  In 3 months   [place] [name]

## 2014-10-25 NOTE — Patient Instructions (Signed)

## 2014-10-26 ENCOUNTER — Other Ambulatory Visit (INDEPENDENT_AMBULATORY_CARE_PROVIDER_SITE_OTHER): Payer: BLUE CROSS/BLUE SHIELD

## 2014-10-26 ENCOUNTER — Telehealth: Payer: Self-pay | Admitting: *Deleted

## 2014-10-26 DIAGNOSIS — E875 Hyperkalemia: Secondary | ICD-10-CM

## 2014-10-26 LAB — NMR, LIPOPROFILE
CHOLESTEROL: 160 mg/dL (ref 100–199)
HDL CHOLESTEROL BY NMR: 17 mg/dL — AB (ref >39)
HDL Particle Number: 23.7 umol/L — ABNORMAL LOW (ref >=30.5)
LDL PARTICLE NUMBER: 1493 nmol/L — AB (ref <1000)
LDL Size: 19.7 nm (ref >20.5)
LP-IR SCORE: 70 — AB (ref <=45)
SMALL LDL PARTICLE NUMBER: 1263 nmol/L — AB (ref <=527)
TRIGLYCERIDES BY NMR: 411 mg/dL — AB (ref 0–149)

## 2014-10-26 LAB — CMP14+EGFR
A/G RATIO: 1.7 (ref 1.1–2.5)
ALBUMIN: 4.4 g/dL (ref 3.6–4.8)
ALT: 44 IU/L (ref 0–44)
AST: 42 IU/L — ABNORMAL HIGH (ref 0–40)
Alkaline Phosphatase: 48 IU/L (ref 39–117)
BUN/Creatinine Ratio: 16 (ref 10–22)
BUN: 16 mg/dL (ref 8–27)
Bilirubin Total: 0.3 mg/dL (ref 0.0–1.2)
CO2: 28 mmol/L (ref 18–29)
CREATININE: 1.03 mg/dL (ref 0.76–1.27)
Calcium: 9.8 mg/dL (ref 8.6–10.2)
Chloride: 101 mmol/L (ref 97–108)
GFR calc Af Amer: 91 mL/min/{1.73_m2} (ref >59)
GFR calc non Af Amer: 79 mL/min/{1.73_m2} (ref >59)
GLOBULIN, TOTAL: 2.6 g/dL (ref 1.5–4.5)
Glucose: 202 mg/dL — ABNORMAL HIGH (ref 65–99)
Potassium: 5.7 mmol/L — ABNORMAL HIGH (ref 3.5–5.2)
SODIUM: 141 mmol/L (ref 134–144)
Total Protein: 7 g/dL (ref 6.0–8.5)

## 2014-10-26 NOTE — Progress Notes (Signed)
Lab only 

## 2014-10-26 NOTE — Telephone Encounter (Signed)
Pt aware and will try to come by this evening

## 2014-10-26 NOTE — Telephone Encounter (Signed)
-----   Message from [facility], [name] sent at 10/26/2014 10:02 AM EDT ----- Hgba1c discussed at appointment Kidney and liver function stable K elevated needs to repeat asap ldl are slowly improving- trig are elevated- should improve with better diabetic control Continue current meds- low fat diet and exercise and recheck in 3 months

## 2014-10-27 LAB — BMP8+EGFR
BUN/Creatinine Ratio: 17 (ref 10–22)
BUN: 16 mg/dL (ref 8–27)
CALCIUM: 10.4 mg/dL — AB (ref 8.6–10.2)
CO2: 27 mmol/L (ref 18–29)
CREATININE: 0.94 mg/dL (ref 0.76–1.27)
Chloride: 99 mmol/L (ref 97–108)
GFR calc non Af Amer: 88 mL/min/{1.73_m2} (ref >59)
GFR, EST AFRICAN AMERICAN: 101 mL/min/{1.73_m2} (ref >59)
Glucose: 156 mg/dL — ABNORMAL HIGH (ref 65–99)
POTASSIUM: 4.7 mmol/L (ref 3.5–5.2)
Sodium: 141 mmol/L (ref 134–144)

## 2014-11-15 ENCOUNTER — Other Ambulatory Visit: Payer: Self-pay | Admitting: Nurse Practitioner

## 2014-11-15 ENCOUNTER — Other Ambulatory Visit: Payer: Self-pay | Admitting: *Deleted

## 2014-11-15 DIAGNOSIS — Z9861 Coronary angioplasty status: Secondary | ICD-10-CM

## 2014-11-16 ENCOUNTER — Telehealth (HOSPITAL_COMMUNITY): Payer: Self-pay

## 2014-11-16 ENCOUNTER — Inpatient Hospital Stay (HOSPITAL_COMMUNITY): Admission: RE | Admit: 2014-11-16 | Payer: Self-pay | Source: Ambulatory Visit

## 2014-11-20 ENCOUNTER — Ambulatory Visit (HOSPITAL_COMMUNITY)
Admission: RE | Admit: 2014-11-20 | Discharge: 2014-11-20 | Disposition: A | Discharge status: Home / Self Care | Payer: BLUE CROSS/BLUE SHIELD | Source: Ambulatory Visit | Attending: Nurse Practitioner | Admitting: Nurse Practitioner

## 2014-11-20 DIAGNOSIS — Z9861 Coronary angioplasty status: Secondary | ICD-10-CM | POA: Diagnosis not present

## 2014-11-20 LAB — EXERCISE TOLERANCE TEST
CSEPED: 7 min
CSEPEW: 8.7 METS
Exercise duration (sec): 6 s
MPHR: 160 {beats}/min
Peak HR: 139 {beats}/min
Percent HR: 86 %
RPE: 15
Rest HR: 76 {beats}/min

## 2014-11-20 NOTE — Telephone Encounter (Signed)
Encounter complete. 

## 2015-01-02 ENCOUNTER — Ambulatory Visit (INDEPENDENT_AMBULATORY_CARE_PROVIDER_SITE_OTHER): Payer: BLUE CROSS/BLUE SHIELD | Admitting: Cardiology

## 2015-01-02 ENCOUNTER — Encounter: Payer: Self-pay | Admitting: Cardiology

## 2015-01-02 VITALS — BP 124/77 | HR 71 | Ht 69.0 in | Wt 243.0 lb

## 2015-01-02 DIAGNOSIS — I10 Essential (primary) hypertension: Secondary | ICD-10-CM

## 2015-01-02 NOTE — Progress Notes (Signed)
HPI The patient presents for followup of coronary artery disease with myocardial infarction in January of 2015 with drug-eluting stent placement to the circumflex.  He has done well since I saw him.  He is very active on his job. He's not had any palpitations, presyncope or syncope. He has not had any chest pressure, neck or arm discomfort. He has had no weight gain or edema.  He has recently changed his diet because his hemoglobin A1c was not at target.  He's eliminated sweets].  Allergies  Allergen Reactions  . Percocet [Oxycodone-Acetaminophen] Nausea And Vomiting    Current Outpatient Prescriptions  Medication Sig Dispense Refill  . aspirin 81 MG tablet Take 81 mg by mouth daily.      . Choline Fenofibrate (FENOFIBRIC ACID) 135 MG CPDR Take 1 capsule by mouth daily. 90 capsule 1  . clopidogrel (PLAVIX) 75 MG tablet Take 1 tablet (75 mg total) by mouth daily. 90 tablet 1  . glimepiride (AMARYL) 4 MG tablet Take 1 tablet (4 mg total) by mouth 2 (two) times daily. 180 tablet 1  . lisinopril (PRINIVIL,ZESTRIL) 10 MG tablet Take 1 tablet (10 mg total) by mouth daily. 90 tablet 1  . meloxicam (MOBIC) 15 MG tablet Take 1 tablet (15 mg total) by mouth daily. 30 tablet 5  . metoprolol tartrate (LOPRESSOR) 25 MG tablet Take 1 tablet (25 mg total) by mouth 2 (two) times daily. 180 tablet 1  . rosuvastatin (CRESTOR) 40 MG tablet Take 0.5 tablets (20 mg total) by mouth daily. 90 tablet 1  . sitaGLIPtin-metformin (JANUMET) 50-1000 MG per tablet Take 1 tablet by mouth 2 (two) times daily with a meal. 180 tablet 1   No current facility-administered medications for this visit.    Past Medical History  Diagnosis Date  . Hypertension   . Hyperglycemia   . Nicotine addiction   . Hyperlipidemia   . Renal colic   . Right shoulder injury 10/11/09  . Diabetes mellitus without complication   . CAD (coronary artery disease)     a. inferior STEMI (05/2013) with RV involvement- LHC:  dist CFX occluded  (Promus premier (2.5x24 mm) DES); EF 40-45% with inf HK;  b. Echo (report pending) with EF 45-50%, lat WMA, RVE, mod RV hypokinesis   . Ischemic cardiomyopathy     Past Surgical History  Procedure Laterality Date  .  heart stent    . Left heart catheterization with coronary angiogram N/A 06/21/2013    Procedure: LEFT HEART CATHETERIZATION WITH CORONARY ANGIOGRAM;  Surgeon: Peter M Swaziland, MD;  Location: [facility] CATH LAB;  Service: Cardiovascular;  Laterality: N/A;    ROS:  As stated in the HPI and negative for all other systems.  PHYSICAL EXAM BP 124/77 mmHg  Pulse 71  Ht 5\' 9"  (1.753 m)  Wt 243 lb (110.224 kg)  BMI 35.87 kg/m2 GENERAL:  Well appearing HEENT:  Pupils equal round and reactive, fundi not visualized, oral mucosa unremarkable, poor dentition. NECK:  No jugular venous distention, waveform within normal limits, carotid upstroke brisk and symmetric, no bruits, no thyromegaly LYMPHATICS:  No cervical, inguinal adenopathy LUNGS:  Clear to auscultation bilaterally BACK:  No CVA tenderness CHEST:  Unremarkable HEART:  PMI not displaced or sustained,S1 and S2 within normal limits, no S3, no S4, no clicks, no rubs, no murmurs ABD:  Flat, positive bowel sounds normal in frequency in pitch, no bruits, no rebound, no guarding, no midline pulsatile mass, no hepatomegaly, no splenomegaly EXT:  2 plus  pulses throughout, no edema, no cyanosis no clubbing SKIN:  No rashes no nodules NEURO:  Cranial nerves II through XII grossly intact, motor grossly intact throughout PSYCH:  Cognitively intact, oriented to person place and time   EKG:  Sinus rhythm, rate 71, axis within normal limits, intervals within normal limits, no acute ST-T wave changes.  Old inferior infract.  01/02/2015   ASSESSMENT AND PLAN  CAD: The patient has no new sypmtoms.  No further cardiovascular testing is indicated. He can stop his Plavix.  ISCHEMIC CARDIOMYOPATHY (EF 45 - 50%) He has had no symptoms.  No further  imaging is indicated.  No change in therapy is planned.   OVERWEIGHT: He is encouraged to continue with weight loss.   DYSLIPIDEMIA:  This is managed by Bennie Pierini, [name]   DM:   His A1C was 8.4.  We discussed this.

## 2015-01-02 NOTE — Patient Instructions (Signed)
Medication Instructions:  Please stop your Plavix. Continue all other medications as listed.  Follow-Up: Follow up in 1 year with Dr. Antoine Poche.  You will receive a letter in the mail 2 months before you are due.  Please call us when you receive this letter to schedule your follow up appointment.  Thank you for choosing [facility]!!

## 2015-02-07 ENCOUNTER — Encounter: Payer: Self-pay | Admitting: Nurse Practitioner

## 2015-02-07 ENCOUNTER — Ambulatory Visit (INDEPENDENT_AMBULATORY_CARE_PROVIDER_SITE_OTHER): Payer: BLUE CROSS/BLUE SHIELD | Admitting: Nurse Practitioner

## 2015-02-07 VITALS — BP 115/72 | HR 65 | Temp 97.0°F | Ht 69.0 in | Wt 239.0 lb

## 2015-02-07 DIAGNOSIS — I10 Essential (primary) hypertension: Secondary | ICD-10-CM

## 2015-02-07 DIAGNOSIS — E1159 Type 2 diabetes mellitus with other circulatory complications: Secondary | ICD-10-CM | POA: Diagnosis not present

## 2015-02-07 DIAGNOSIS — E785 Hyperlipidemia, unspecified: Secondary | ICD-10-CM | POA: Diagnosis not present

## 2015-02-07 DIAGNOSIS — I25111 Atherosclerotic heart disease of native coronary artery with angina pectoris with documented spasm: Secondary | ICD-10-CM

## 2015-02-07 LAB — POCT GLYCOSYLATED HEMOGLOBIN (HGB A1C): Hemoglobin A1C: 8.1

## 2015-02-07 NOTE — Progress Notes (Signed)
Subjective:    Patient ID: Tyler Garner, male    DOB: 10/02/1955, 59 y.o.   MRN: [id number]   Patient here today for follow up- no acute complaint. C/O hand and knee pain and occasional back pain- arthritis runs in family.  Hyperlipidemia This is a chronic problem. The current episode started more than 1 year ago. The problem is uncontrolled. Recent lipid tests were reviewed and are high. Exacerbating diseases include diabetes and obesity. He has no history of hypothyroidism. Pertinent negatives include no myalgias or shortness of breath. Current antihyperlipidemic treatment includes statins. The current treatment provides mild improvement of lipids. Compliance problems include adherence to diet and adherence to exercise.  Risk factors for coronary artery disease include dyslipidemia, family history, hypertension, male sex and obesity.  Hypertension This is a chronic problem. The problem has been waxing and waning since onset. The problem is uncontrolled. Pertinent negatives include no headaches, palpitations or shortness of breath. Risk factors for coronary artery disease include obesity, male gender, dyslipidemia and diabetes mellitus. Past treatments include ACE inhibitors and beta blockers. Hypertensive end-organ damage includes CVA (stent placement heart).  Diabetes He presents for his follow-up diabetic visit. He has type 2 diabetes mellitus. No MedicAlert identification noted. His disease course has been fluctuating. Pertinent negatives for hypoglycemia include no headaches. Pertinent negatives for diabetes include no fatigue, no polydipsia, no polyphagia, no polyuria and no visual change. Diabetic complications include a CVA (stent placement heart). Current diabetic treatment includes oral agent (triple therapy). He is compliant with treatment most of the time. His weight is stable. He is following a high fat/cholesterol diet. When asked about meal planning, he reported none. He has not had a  previous visit with a dietitian. He rarely participates in exercise. There is no change in his home blood glucose trend. His breakfast blood glucose is taken between 8-9 am. His breakfast blood glucose range is generally 110-130 mg/dl. His highest blood glucose is 180-200 mg/dl. His overall blood glucose range is 110-130 mg/dl. An ACE inhibitor/angiotensin II receptor blocker is being taken. He does not see a podiatrist.Eye exam is not current.  Hx of stent placement/CAD Is doing well since stent placement. No c/o shortness of breath.    Review of Systems  Constitutional: Negative.  Negative for fatigue.  HENT: Negative.   Eyes: Negative.   Respiratory: Negative.  Negative for shortness of breath.   Cardiovascular: Negative.  Negative for palpitations.  Gastrointestinal: Negative.   Endocrine: Negative.  Negative for polydipsia, polyphagia and polyuria.  Genitourinary: Negative.  Negative for urgency, hematuria and penile swelling.  Musculoskeletal: Negative.  Negative for myalgias.  Skin: Negative.   Allergic/Immunologic: Negative.   Neurological: Negative.  Negative for headaches.  Hematological: Negative.   Psychiatric/Behavioral: Negative.   All other systems reviewed and are negative.      Objective:   Physical Exam  Constitutional: He is oriented to person, place, and time. He appears well-developed and well-nourished.  HENT:  Head: Normocephalic.  Right Ear: External ear normal.  Left Ear: External ear normal.  Nose: Nose normal.  Mouth/Throat: Oropharynx is clear and moist.  Eyes: EOM are normal. Pupils are equal, round, and reactive to light.  Neck: Normal range of motion. Neck supple. No JVD present. No thyromegaly present.  Cardiovascular: Normal rate, regular rhythm, normal heart sounds and intact distal pulses.  Exam reveals no gallop and no friction rub.   No murmur heard. Pulmonary/Chest: Effort normal and breath sounds normal. No respiratory distress. He has  no  wheezes. He has no rales. He exhibits no tenderness.  Abdominal: Soft. Bowel sounds are normal. He exhibits no mass. There is no tenderness.  Genitourinary:  Refuses rectal exam  Musculoskeletal: Normal range of motion. He exhibits no edema.  Lymphadenopathy:    He has no cervical adenopathy.  Neurological: He is alert and oriented to person, place, and time. No cranial nerve deficit.  Skin: Skin is warm and dry.  Psychiatric: He has a normal mood and affect. His behavior is normal. Judgment and thought content normal.   BP 115/72 mmHg  Pulse 65  Temp(Src) 97 F (36.1 C) (Oral)  Ht 5' 9"  (1.753 m)  Wt 239 lb (108.41 kg)  BMI 35.28 kg/m2  Results for orders placed or performed in visit on 02/07/15  POCT glycosylated hemoglobin (Hb A1C)  Result Value Ref Range   Hemoglobin A1C 8.1       Assessment & Plan:  1. Essential hypertension Do not add salt to diet - CMP14+EGFR  2. Hyperlipidemia Low fat diet - Lipid panel  3. Type 2 diabetes mellitus with other circulatory complications Continue carb counting- stricter hgba1c improving - POCT glycosylated hemoglobin (Hb A1C)  4. Atherosclerosis of native coronary artery of native heart with angina pectoris with documented spasm  5. Morbid obesity Discussed diet and exercise for person with BMI >25 Will recheck weight in 3-6 months     Labs pending Health maintenance reviewed Diet and exercise encouraged Continue all meds Follow up  In 3 month   [place], [name]    1. Essential hypertension Do not add salt to diet - CMP14+EGFR - lisinopril (PRINIVIL,ZESTRIL) 10 MG tablet; Take 1 tablet (10 mg total) by mouth daily.  Dispense: 90 tablet; Refill: 1  2. Hyperlipidemia Low fat diet - NMR, lipoprofile - Choline Fenofibrate (FENOFIBRIC ACID) 135 MG CPDR; Take 1 capsule by mouth daily.  Dispense: 90 capsule; Refill: 1 - rosuvastatin (CRESTOR) 40 MG tablet; Take 0.5 tablets (20 mg total) by mouth daily.   Dispense: 90 tablet; Refill: 1  3. Type 2 diabetes mellitus with other circulatory complications doies not want to go on shots- will try diet- if hgba1c is no better at next visit will have to go on shots- maybe bydureon or victozia - POCT glycosylated hemoglobin (Hb A1C) - sitaGLIPtin-metformin (JANUMET) 50-1000 MG per tablet; Take 1 tablet by mouth 2 (two) times daily with a meal.  Dispense: 180 tablet; Refill: 1 - glimepiride (AMARYL) 4 MG tablet; Take 1 tablet (4 mg total) by mouth 2 (two) times daily.  Dispense: 180 tablet; Refill: 1  4. Atherosclerosis of native coronary artery of native heart with angina pectoris with documented spasm - clopidogrel (PLAVIX) 75 MG tablet; Take 1 tablet (75 mg total) by mouth daily.  Dispense: 90 tablet; Refill: 1  5. NSVT (nonsustained ventricular tachycardia) - metoprolol tartrate (LOPRESSOR) 25 MG tablet; Take 1 tablet (25 mg total) by mouth 2 (two) times daily.  Dispense: 180 tablet; Refill: 1  6. Arthritic pain - mobic 45m 1 po qd #30 5 refills   Labs pending Health maintenance reviewed Diet and exercise encouraged Continue all meds Follow up  In 3 months   [place] [name]

## 2015-02-07 NOTE — Patient Instructions (Signed)
Exercise to Stay Healthy Exercise helps you become and stay healthy. EXERCISE IDEAS AND TIPS Choose exercises that:  You enjoy.  Fit into your day. You do not need to exercise really hard to be healthy. You can do exercises at a slow or medium level and stay healthy. You can:  Stretch before and after working out.  Try yoga, Pilates, or tai chi.  Lift weights.  Walk fast, swim, jog, run, climb stairs, bicycle, dance, or rollerskate.  Take aerobic classes. Exercises that burn about 150 calories:  Running 1  miles in 15 minutes.  Playing volleyball for 45 to 60 minutes.  Washing and waxing a car for 45 to 60 minutes.  Playing touch football for 45 minutes.  Walking 1  miles in 35 minutes.  Pushing a stroller 1  miles in 30 minutes.  Playing basketball for 30 minutes.  Raking leaves for 30 minutes.  Bicycling 5 miles in 30 minutes.  Walking 2 miles in 30 minutes.  Dancing for 30 minutes.  Shoveling snow for 15 minutes.  Swimming laps for 20 minutes.  Walking up stairs for 15 minutes.  Bicycling 4 miles in 15 minutes.  Gardening for 30 to 45 minutes.  Jumping rope for 15 minutes.  Washing windows or floors for 45 to 60 minutes. Document Released: 06/13/2010 Document Revised: 08/03/2011 Document Reviewed: 06/13/2010 Exit[employer] Patient Information 2015 [employer]. This information is not intended to replace advice given to you by your health care provider. Make sure you discuss any questions you have with your health care provider.  

## 2015-02-08 ENCOUNTER — Other Ambulatory Visit (INDEPENDENT_AMBULATORY_CARE_PROVIDER_SITE_OTHER): Payer: BLUE CROSS/BLUE SHIELD

## 2015-02-08 DIAGNOSIS — E875 Hyperkalemia: Secondary | ICD-10-CM

## 2015-02-08 LAB — CMP14+EGFR
A/G RATIO: 1.8 (ref 1.1–2.5)
ALBUMIN: 4.6 g/dL (ref 3.6–4.8)
ALT: 47 IU/L — ABNORMAL HIGH (ref 0–44)
AST: 53 IU/L — ABNORMAL HIGH (ref 0–40)
Alkaline Phosphatase: 45 IU/L (ref 39–117)
BUN / CREAT RATIO: 18 (ref 10–22)
BUN: 22 mg/dL (ref 8–27)
Bilirubin Total: 0.4 mg/dL (ref 0.0–1.2)
CALCIUM: 10 mg/dL (ref 8.6–10.2)
CO2: 26 mmol/L (ref 18–29)
Chloride: 99 mmol/L (ref 97–108)
Creatinine, Ser: 1.24 mg/dL (ref 0.76–1.27)
GFR calc Af Amer: 73 mL/min/{1.73_m2} (ref >59)
GFR, EST NON AFRICAN AMERICAN: 63 mL/min/{1.73_m2} (ref >59)
GLOBULIN, TOTAL: 2.5 g/dL (ref 1.5–4.5)
Glucose: 194 mg/dL — ABNORMAL HIGH (ref 65–99)
POTASSIUM: 6.2 mmol/L — AB (ref 3.5–5.2)
SODIUM: 139 mmol/L (ref 134–144)
TOTAL PROTEIN: 7.1 g/dL (ref 6.0–8.5)

## 2015-02-08 LAB — LIPID PANEL
Chol/HDL Ratio: 9.9 ratio units — ABNORMAL HIGH (ref 0.0–5.0)
Cholesterol, Total: 179 mg/dL (ref 100–199)
HDL: 18 mg/dL — ABNORMAL LOW (ref >39)
Triglycerides: 455 mg/dL — ABNORMAL HIGH (ref 0–149)

## 2015-02-08 NOTE — Progress Notes (Signed)
Lab only 

## 2015-02-09 LAB — POTASSIUM: POTASSIUM: 5.4 mmol/L — AB (ref 3.5–5.2)

## 2015-02-13 ENCOUNTER — Other Ambulatory Visit: Payer: BLUE CROSS/BLUE SHIELD

## 2015-02-13 DIAGNOSIS — E875 Hyperkalemia: Secondary | ICD-10-CM

## 2015-02-13 NOTE — Progress Notes (Signed)
Lab only 

## 2015-02-14 LAB — POTASSIUM: POTASSIUM: 4.6 mmol/L (ref 3.5–5.2)

## 2015-04-01 ENCOUNTER — Other Ambulatory Visit: Payer: Self-pay | Admitting: Nurse Practitioner

## 2015-04-23 ENCOUNTER — Telehealth: Payer: Self-pay | Admitting: Nurse Practitioner

## 2015-05-01 ENCOUNTER — Other Ambulatory Visit: Payer: Self-pay | Admitting: Nurse Practitioner

## 2015-05-09 NOTE — Telephone Encounter (Signed)
WILL GET FLU SHOT  @ [facility]

## 2015-05-18 ENCOUNTER — Other Ambulatory Visit: Payer: Self-pay | Admitting: Nurse Practitioner

## 2015-05-24 ENCOUNTER — Encounter: Payer: Self-pay | Admitting: Nurse Practitioner

## 2015-05-24 ENCOUNTER — Ambulatory Visit (INDEPENDENT_AMBULATORY_CARE_PROVIDER_SITE_OTHER): Payer: BLUE CROSS/BLUE SHIELD | Admitting: Nurse Practitioner

## 2015-05-24 VITALS — BP 147/81 | HR 72 | Temp 96.9°F | Ht 69.0 in | Wt 241.4 lb

## 2015-05-24 DIAGNOSIS — I10 Essential (primary) hypertension: Secondary | ICD-10-CM | POA: Diagnosis not present

## 2015-05-24 DIAGNOSIS — E785 Hyperlipidemia, unspecified: Secondary | ICD-10-CM

## 2015-05-24 DIAGNOSIS — I472 Ventricular tachycardia: Secondary | ICD-10-CM

## 2015-05-24 DIAGNOSIS — E1159 Type 2 diabetes mellitus with other circulatory complications: Secondary | ICD-10-CM

## 2015-05-24 DIAGNOSIS — Z23 Encounter for immunization: Secondary | ICD-10-CM | POA: Diagnosis not present

## 2015-05-24 DIAGNOSIS — I4729 Other ventricular tachycardia: Secondary | ICD-10-CM

## 2015-05-24 LAB — POCT GLYCOSYLATED HEMOGLOBIN (HGB A1C): HEMOGLOBIN A1C: 8.5

## 2015-05-24 MED ORDER — ROSUVASTATIN CALCIUM 40 MG PO TABS: 20.0000 mg | ORAL_TABLET | Freq: Every day | ORAL | Status: DC

## 2015-05-24 MED ORDER — LISINOPRIL 10 MG PO TABS: 10.0000 mg | ORAL_TABLET | Freq: Every day | ORAL | Status: DC

## 2015-05-24 MED ORDER — FENOFIBRIC ACID 135 MG PO CPDR: 1.0000 | DELAYED_RELEASE_CAPSULE | Freq: Every day | ORAL | Status: DC

## 2015-05-24 MED ORDER — SITAGLIPTIN PHOS-METFORMIN HCL 50-1000 MG PO TABS: 1.0000 | ORAL_TABLET | Freq: Two times a day (BID) | ORAL | Status: DC

## 2015-05-24 MED ORDER — METOPROLOL TARTRATE 25 MG PO TABS: 25.0000 mg | ORAL_TABLET | Freq: Two times a day (BID) | ORAL | Status: DC

## 2015-05-24 MED ORDER — GLIMEPIRIDE 4 MG PO TABS: 4.0000 mg | ORAL_TABLET | Freq: Two times a day (BID) | ORAL | Status: DC

## 2015-05-24 NOTE — Patient Instructions (Signed)

## 2015-05-24 NOTE — Progress Notes (Signed)
Subjective:    Patient ID: Tyler Garner, male    DOB: 01-17-1956, 58 y.o.   MRN: [id number]   Patient here today for follow up- no acute complaint.   Hypertension This is a chronic problem. The problem has been waxing and waning since onset. The problem is uncontrolled. Pertinent negatives include no headaches, palpitations or shortness of breath. Risk factors for coronary artery disease include obesity, male gender, dyslipidemia and diabetes mellitus. Past treatments include ACE inhibitors and beta blockers. Hypertensive end-organ damage includes CVA (stent placement heart).  Hyperlipidemia This is a chronic problem. The current episode started more than 1 year ago. The problem is uncontrolled. Recent lipid tests were reviewed and are high. Exacerbating diseases include diabetes and obesity. He has no history of hypothyroidism. Pertinent negatives include no myalgias or shortness of breath. Current antihyperlipidemic treatment includes statins. The current treatment provides mild improvement of lipids. Compliance problems include adherence to diet and adherence to exercise.  Risk factors for coronary artery disease include dyslipidemia, family history, hypertension, male sex and obesity.  Diabetes He presents for his follow-up diabetic visit. He has type 2 diabetes mellitus. No MedicAlert identification noted. His disease course has been fluctuating. Pertinent negatives for hypoglycemia include no headaches. Pertinent negatives for diabetes include no fatigue, no polydipsia, no polyphagia, no polyuria and no visual change. Diabetic complications include a CVA (stent placement heart). Current diabetic treatment includes oral agent (triple therapy). He is compliant with treatment most of the time. His weight is stable. He is following a high fat/cholesterol diet. When asked about meal planning, he reported none. He has not had a previous visit with a dietitian. He rarely participates in exercise. There  is no change in his home blood glucose trend. His breakfast blood glucose is taken between 8-9 am. His breakfast blood glucose range is generally 110-130 mg/dl. His highest blood glucose is 180-200 mg/dl. His overall blood glucose range is 110-130 mg/dl. An ACE inhibitor/angiotensin II receptor blocker is being taken. He does not see a podiatrist.Eye exam is not current.  Hx of stent placement/CAD Is doing well since stent placement. No c/o shortness of breath.    Review of Systems  Constitutional: Negative.  Negative for fatigue.  HENT: Negative.   Eyes: Negative.   Respiratory: Negative.  Negative for shortness of breath.   Cardiovascular: Negative.  Negative for palpitations.  Gastrointestinal: Negative.   Endocrine: Negative.  Negative for polydipsia, polyphagia and polyuria.  Genitourinary: Negative.  Negative for urgency, hematuria and penile swelling.  Musculoskeletal: Negative.  Negative for myalgias.  Skin: Negative.   Allergic/Immunologic: Negative.   Neurological: Negative.  Negative for headaches.  Hematological: Negative.   Psychiatric/Behavioral: Negative.   All other systems reviewed and are negative.      Objective:   Physical Exam  Constitutional: He is oriented to person, place, and time. He appears well-developed and well-nourished.  HENT:  Head: Normocephalic.  Right Ear: External ear normal.  Left Ear: External ear normal.  Nose: Nose normal.  Mouth/Throat: Oropharynx is clear and moist.  Eyes: EOM are normal. Pupils are equal, round, and reactive to light.  Neck: Normal range of motion. Neck supple. No JVD present. No thyromegaly present.  Cardiovascular: Normal rate, regular rhythm, normal heart sounds and intact distal pulses.  Exam reveals no gallop and no friction rub.   No murmur heard. Pulmonary/Chest: Effort normal and breath sounds normal. No respiratory distress. He has no wheezes. He has no rales. He exhibits no tenderness.  Abdominal:  Soft.  Bowel sounds are normal. He exhibits no mass. There is no tenderness.  Genitourinary:  Refuses rectal exam  Musculoskeletal: Normal range of motion. He exhibits no edema.  Lymphadenopathy:    He has no cervical adenopathy.  Neurological: He is alert and oriented to person, place, and time. No cranial nerve deficit.  Skin: Skin is warm and dry.  Psychiatric: He has a normal mood and affect. His behavior is normal. Judgment and thought content normal.    BP 147/81 mmHg  Pulse 72  Temp(Src) 96.9 F (36.1 C) (Oral)  Ht 5' 9"  (1.753 m)  Wt 241 lb 6.4 oz (109.498 kg)  BMI 35.63 kg/m2  Results for orders placed or performed in visit on 05/24/15  POCT glycosylated hemoglobin (Hb A1C)  Result Value Ref Range   Hemoglobin A1C 8.5         Assessment & Plan:  1. Essential hypertension Do not add salt to diet - CMP14+EGFR  2. Hyperlipidemia Low fat diet - Lipid panel  3. Type 2 diabetes mellitus with other circulatory complications Continue carb counting- stricter hgba1c worsened- if no better at next visit will need to see clinical pharmacist and go on injection - POCT glycosylated hemoglobin (Hb A1C)  4. Atherosclerosis of native coronary artery of native heart with angina pectoris with documented spasm  5. Morbid obesity Discussed diet and exercise for person with BMI >25 Will recheck weight in 3-6 months   Encouraged to do hemoccult cards given to hjim at last visit Encouraged to have eye exam Labs pending Health maintenance reviewed Diet and exercise encouraged Continue all meds Follow up  In 3 month   [place], [name]

## 2015-05-25 LAB — CMP14+EGFR
ALBUMIN: 4.4 g/dL (ref 3.6–4.8)
ALK PHOS: 49 IU/L (ref 39–117)
ALT: 40 IU/L (ref 0–44)
AST: 40 IU/L (ref 0–40)
Albumin/Globulin Ratio: 1.9 (ref 1.1–2.5)
BUN/Creatinine Ratio: 13 (ref 10–22)
BUN: 14 mg/dL (ref 8–27)
Bilirubin Total: 0.4 mg/dL (ref 0.0–1.2)
CO2: 27 mmol/L (ref 18–29)
Calcium: 9.6 mg/dL (ref 8.6–10.2)
Chloride: 99 mmol/L (ref 96–106)
Creatinine, Ser: 1.09 mg/dL (ref 0.76–1.27)
GFR calc Af Amer: 85 mL/min/{1.73_m2} (ref >59)
GFR calc non Af Amer: 73 mL/min/{1.73_m2} (ref >59)
Globulin, Total: 2.3 g/dL (ref 1.5–4.5)
Glucose: 190 mg/dL — ABNORMAL HIGH (ref 65–99)
Potassium: 5.2 mmol/L (ref 3.5–5.2)
Sodium: 142 mmol/L (ref 134–144)
Total Protein: 6.7 g/dL (ref 6.0–8.5)

## 2015-05-25 LAB — LIPID PANEL
CHOLESTEROL TOTAL: 175 mg/dL (ref 100–199)
Chol/HDL Ratio: 9.2 ratio units — ABNORMAL HIGH (ref 0.0–5.0)
HDL: 19 mg/dL — ABNORMAL LOW (ref >39)
LDL Calculated: 83 mg/dL (ref 0–99)
TRIGLYCERIDES: 364 mg/dL — AB (ref 0–149)
VLDL CHOLESTEROL CAL: 73 mg/dL — AB (ref 5–40)

## 2015-05-25 LAB — MICROALBUMIN / CREATININE URINE RATIO
Creatinine, Urine: 264.3 mg/dL
MICROALB/CREAT RATIO: 7.1 mg/g creat (ref 0.0–30.0)
Microalbumin, Urine: 18.8 ug/mL

## 2015-06-04 ENCOUNTER — Encounter: Payer: Self-pay | Admitting: *Deleted

## 2015-06-10 ENCOUNTER — Encounter: Payer: Self-pay | Admitting: Family Medicine

## 2015-06-10 ENCOUNTER — Ambulatory Visit (INDEPENDENT_AMBULATORY_CARE_PROVIDER_SITE_OTHER): Payer: BLUE CROSS/BLUE SHIELD

## 2015-06-10 ENCOUNTER — Ambulatory Visit (INDEPENDENT_AMBULATORY_CARE_PROVIDER_SITE_OTHER): Payer: BLUE CROSS/BLUE SHIELD | Admitting: Family Medicine

## 2015-06-10 VITALS — BP 121/76 | HR 80 | Temp 97.2°F | Ht 69.0 in | Wt 240.0 lb

## 2015-06-10 DIAGNOSIS — S300XXA Contusion of lower back and pelvis, initial encounter: Secondary | ICD-10-CM

## 2015-06-10 DIAGNOSIS — S5002XA Contusion of left elbow, initial encounter: Secondary | ICD-10-CM | POA: Diagnosis not present

## 2015-06-10 MED ORDER — DICLOFENAC SODIUM 75 MG PO TBEC: 75.0000 mg | DELAYED_RELEASE_TABLET | Freq: Two times a day (BID) | ORAL | Status: DC

## 2015-06-10 NOTE — Progress Notes (Signed)
Subjective:  Patient ID: MARCEL GARY, male    DOB: 09-Oct-1955  Age: 60 y.o. MRN: [id number]  CC: Elbow Pain   HPI TAYSHAUN KROH presents for fall on 1/11. Slipped on ice. Fell landing on back. Hit head and left elbow. Now pain on inside of elbow5/10 with supination.  Bsck pain left lumbar. Pain with movement twisting or bending. 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 Resting, applying heat  Saw stars when he hit his head. FROM without pain  History Cosmo has a past medical history of Hypertension; Hyperglycemia; Nicotine addiction; Hyperlipidemia; Renal colic; Right shoulder injury (10/11/09); Diabetes mellitus without complication (HCC); CAD (coronary artery disease); and Ischemic cardiomyopathy.   He has past surgical history that includes  heart stent and left heart catheterization with coronary angiogram (N/A, 06/21/2013).   His family history includes ALS in his mother; Alzheimer's disease in his father; Diabetes in his brother and mother.He reports that he has quit smoking. He does not have any smokeless tobacco history on file. He reports that he drinks alcohol. He reports that he does not use illicit drugs.  Outpatient Prescriptions Prior to Visit  Medication Sig Dispense Refill  . aspirin 81 MG tablet Take 81 mg by mouth daily.      . Choline Fenofibrate (FENOFIBRIC ACID) 135 MG CPDR Take 1 capsule by mouth daily. 90 capsule 1  . glimepiride (AMARYL) 4 MG tablet Take 1 tablet (4 mg total) by mouth 2 (two) times daily. 180 tablet 1  . lisinopril (PRINIVIL,ZESTRIL) 10 MG tablet Take 1 tablet (10 mg total) by mouth daily. 90 tablet 1  . metoprolol tartrate (LOPRESSOR) 25 MG tablet Take 1 tablet (25 mg total) by mouth 2 (two) times daily. 180 tablet 1  . rosuvastatin (CRESTOR) 40 MG tablet Take 0.5 tablets (20 mg total) by mouth daily. 45 tablet 5  . sitaGLIPtin-metformin (JANUMET) 50-1000 MG tablet Take 1 tablet by mouth 2 (two) times daily with a meal. 180 tablet 1     No facility-administered medications prior to visit.    ROS Review of Systems  Constitutional: Negative for fever, chills, diaphoresis and unexpected weight change.  HENT: Negative for congestion, hearing loss, rhinorrhea and sore throat.   Eyes: Negative for visual disturbance.  Respiratory: Negative for cough and shortness of breath.   Cardiovascular: Negative for chest pain.  Gastrointestinal: Negative for abdominal pain, diarrhea and constipation.  Genitourinary: Negative for dysuria and flank pain.  Musculoskeletal:       See history of present illness   Skin: Negative for rash.  Neurological: Negative for dizziness and headaches.  Psychiatric/Behavioral: Negative for sleep disturbance and dysphoric mood.    Objective:  BP 121/76 mmHg  Pulse 80  Temp(Src) 97.2 F (36.2 C) (Oral)  Ht  (1.753 m)  Wt 240 lb (108.863 kg)  BMI 35.43 kg/m2  BP Readings from Last 3 Encounters:  06/10/15 121/76  05/24/15 147/81  02/07/15 115/72    Wt Readings from Last 3 Encounters:  06/10/15 240 lb (108.863 kg)  05/24/15 241 lb 6.4 oz (109.498 kg)  02/07/15 239 lb (108.41 kg)     Physical Exam  Constitutional: He is oriented to person, place, and time. He appears well-developed and well-nourished. No distress.  HENT:  Head: Normocephalic and atraumatic.  Right Ear: External ear normal.  Left Ear: External ear normal.  Nose: Nose normal.  Mouth/Throat: Oropharynx is clear and moist.  Eyes: Conjunctivae and EOM are normal. Pupils are equal, round,  and reactive to light.  Neck: Normal range of motion. Neck supple. No thyromegaly present.  Cardiovascular: Normal rate, regular rhythm and normal heart sounds.   No murmur heard. Pulmonary/Chest: Effort normal and breath sounds normal. No respiratory distress. He has no wheezes. He has no rales.  Abdominal: Soft. Bowel sounds are normal. He exhibits no distension. There is no tenderness.  Musculoskeletal: He exhibits edema (at  the left elbow) and tenderness (Left elbow and leg as well as the lower back at the paraspinous muscular to the left.).  Lymphadenopathy:    He has no cervical adenopathy.  Neurological: He is alert and oriented to person, place, and time. He has normal reflexes.  Skin: Skin is warm and dry.  Psychiatric: He has a normal mood and affect. His behavior is normal. Judgment and thought content normal.     Lab Results  Component Value Date   WBC 11.2* 06/24/2013   HGB 12.3* 06/24/2013   HCT 36.9* 06/24/2013   PLT 261 06/24/2013   GLUCOSE 190* 05/24/2015   CHOL 175 05/24/2015   TRIG 364* 05/24/2015   HDL 19* 05/24/2015   LDLCALC 83 05/24/2015   ALT 40 05/24/2015   AST 40 05/24/2015   NA 142 05/24/2015   K 5.2 05/24/2015   CL 99 05/24/2015   CREATININE 1.09 05/24/2015   BUN 14 05/24/2015   CO2 27 05/24/2015   PSA 0.68 12/05/2012   INR 1.04 06/21/2013   HGBA1C 8.5 05/24/2015    No results found.  Assessment & Plan:   Heng was seen today for elbow pain.  Diagnoses and all orders for this visit:  Lumbar contusion, initial encounter -     DG Lumbar Spine 2-3 Views; Future  Contusion, elbow, left, initial encounter -     DG Elbow 2 Views Left; Future  Other orders -     diclofenac (VOLTAREN) 75 MG EC tablet; Take 1 tablet (75 mg total) by mouth 2 (two) times daily.  I am having Mr. Orris start on diclofenac. I am also having him maintain his aspirin, lisinopril, Fenofibric Acid, metoprolol tartrate, sitaGLIPtin-metformin, glimepiride, and rosuvastatin.  Meds ordered this encounter  Medications  . diclofenac (VOLTAREN) 75 MG EC tablet    Sig: Take 1 tablet (75 mg total) by mouth 2 (two) times daily.    Dispense:  60 tablet    Refill:  2     Follow-up: Return 1 week if not better.  Mechele Claude, M.D.

## 2015-06-10 NOTE — Patient Instructions (Signed)

## 2015-10-24 ENCOUNTER — Other Ambulatory Visit: Payer: Self-pay | Admitting: Nurse Practitioner

## 2015-11-21 ENCOUNTER — Other Ambulatory Visit: Payer: Self-pay | Admitting: Nurse Practitioner

## 2015-11-25 ENCOUNTER — Ambulatory Visit (INDEPENDENT_AMBULATORY_CARE_PROVIDER_SITE_OTHER): Payer: BLUE CROSS/BLUE SHIELD | Admitting: Nurse Practitioner

## 2015-11-25 ENCOUNTER — Encounter: Payer: Self-pay | Admitting: Nurse Practitioner

## 2015-11-25 DIAGNOSIS — E785 Hyperlipidemia, unspecified: Secondary | ICD-10-CM | POA: Diagnosis not present

## 2015-11-25 DIAGNOSIS — I25111 Atherosclerotic heart disease of native coronary artery with angina pectoris with documented spasm: Secondary | ICD-10-CM

## 2015-11-25 DIAGNOSIS — Z1159 Encounter for screening for other viral diseases: Secondary | ICD-10-CM | POA: Diagnosis not present

## 2015-11-25 DIAGNOSIS — I213 ST elevation (STEMI) myocardial infarction of unspecified site: Secondary | ICD-10-CM

## 2015-11-25 DIAGNOSIS — Z1212 Encounter for screening for malignant neoplasm of rectum: Secondary | ICD-10-CM | POA: Diagnosis not present

## 2015-11-25 DIAGNOSIS — I2119 ST elevation (STEMI) myocardial infarction involving other coronary artery of inferior wall: Secondary | ICD-10-CM

## 2015-11-25 DIAGNOSIS — E1159 Type 2 diabetes mellitus with other circulatory complications: Secondary | ICD-10-CM

## 2015-11-25 DIAGNOSIS — I10 Essential (primary) hypertension: Secondary | ICD-10-CM | POA: Diagnosis not present

## 2015-11-25 DIAGNOSIS — I219 Acute myocardial infarction, unspecified: Secondary | ICD-10-CM

## 2015-11-25 LAB — BAYER DCA HB A1C WAIVED: HB A1C (BAYER DCA - WAIVED): 13 % — ABNORMAL HIGH (ref <7.0)

## 2015-11-25 MED ORDER — GLIMEPIRIDE 4 MG PO TABS: 4.0000 mg | ORAL_TABLET | Freq: Two times a day (BID) | ORAL | Status: DC

## 2015-11-25 MED ORDER — METOPROLOL TARTRATE 25 MG PO TABS: 25.0000 mg | ORAL_TABLET | Freq: Two times a day (BID) | ORAL | Status: DC

## 2015-11-25 MED ORDER — LISINOPRIL 10 MG PO TABS: 10.0000 mg | ORAL_TABLET | Freq: Every day | ORAL | Status: DC

## 2015-11-25 MED ORDER — SITAGLIPTIN PHOS-METFORMIN HCL 50-1000 MG PO TABS: 1.0000 | ORAL_TABLET | Freq: Two times a day (BID) | ORAL | Status: DC

## 2015-11-25 MED ORDER — FENOFIBRIC ACID 135 MG PO CPDR: 1.0000 | DELAYED_RELEASE_CAPSULE | Freq: Every day | ORAL | Status: DC

## 2015-11-25 MED ORDER — ROSUVASTATIN CALCIUM 40 MG PO TABS: 20.0000 mg | ORAL_TABLET | Freq: Every day | ORAL | Status: DC

## 2015-11-25 NOTE — Patient Instructions (Signed)

## 2015-11-25 NOTE — Progress Notes (Signed)
Subjective:    Patient ID: Tyler Garner, male    DOB: [birth year], 60 y.o.   MRN: [id number]   Patient here today for follow up of chronic medical problems.  Outpatient Encounter Prescriptions as of 11/25/2015  Medication Sig  . aspirin 81 MG tablet Take 81 mg by mouth daily.    . Choline Fenofibrate (FENOFIBRIC ACID) 135 MG CPDR TAKE ONE CAPSULE EVERY DAY  . diclofenac (VOLTAREN) 75 MG EC tablet Take 1 tablet (75 mg total) by mouth 2 (two) times daily.  Marland Kitchen glimepiride (AMARYL) 4 MG tablet TAKE 1 TABLET TWICE A DAY  . lisinopril (PRINIVIL,ZESTRIL) 10 MG tablet TAKE 1 TABLET EVERY DAY  . metoprolol tartrate (LOPRESSOR) 25 MG tablet TAKE 1 TABLET TWICE A DAY  . rosuvastatin (CRESTOR) 40 MG tablet Take 0.5 tablets (20 mg total) by mouth daily.  . sitaGLIPtin-metformin (JANUMET) 50-1000 MG tablet Take 1 tablet by mouth 2 (two) times daily with a meal.   No facility-administered encounter medications on file as of 11/25/2015.    Hypertension This is a chronic problem. The problem has been waxing and waning since onset. The problem is uncontrolled. Pertinent negatives include no headaches, palpitations or shortness of breath. Risk factors for coronary artery disease include obesity, male gender, dyslipidemia and diabetes mellitus. Past treatments include ACE inhibitors and beta blockers. Hypertensive end-organ damage includes CVA (stent placement heart).  Hyperlipidemia This is a chronic problem. The current episode started more than 1 year ago. The problem is uncontrolled. Recent lipid tests were reviewed and are high. Exacerbating diseases include diabetes and obesity. He has no history of hypothyroidism. Pertinent negatives include no myalgias or shortness of breath. Current antihyperlipidemic treatment includes statins. The current treatment provides mild improvement of lipids. Compliance problems include adherence to diet and adherence to exercise.  Risk factors for coronary artery disease include  dyslipidemia, family history, hypertension, male sex and obesity.  Diabetes He presents for his follow-up diabetic visit. He has type 2 diabetes mellitus. No MedicAlert identification noted. His disease course has been fluctuating. Pertinent negatives for hypoglycemia include no headaches. Pertinent negatives for diabetes include no fatigue, no polydipsia, no polyphagia, no polyuria and no visual change. Diabetic complications include a CVA (stent placement heart). Current diabetic treatment includes oral agent (triple therapy). He is compliant with treatment most of the time. His weight is stable. He is following a high fat/cholesterol diet. When asked about meal planning, he reported none. He has not had a previous visit with a dietitian. He rarely participates in exercise. His home blood glucose trend is increasing rapidly (patient has not been checking blood sugars the last month). His breakfast blood glucose is taken between 8-9 am. His breakfast blood glucose range is generally 180-200 mg/dl. His highest blood glucose is >200 mg/dl. His overall blood glucose range is >200 mg/dl. An ACE inhibitor/angiotensin II receptor blocker is being taken. He does not see a podiatrist.Eye exam is not current.  Hx of stent placement/CAD/Hx stemi Is doing well since stent placement. No c/o shortness of breath.    Review of Systems  Constitutional: Negative.  Negative for fatigue.  HENT: Negative.   Eyes: Negative.   Respiratory: Negative.  Negative for shortness of breath.   Cardiovascular: Negative.  Negative for palpitations.  Gastrointestinal: Negative.   Endocrine: Negative.  Negative for polydipsia, polyphagia and polyuria.  Genitourinary: Negative.  Negative for urgency, hematuria and penile swelling.  Musculoskeletal: Negative.  Negative for myalgias.  Skin: Negative.   Allergic/Immunologic: Negative.  Neurological: Negative.  Negative for headaches.  Hematological: Negative.    Psychiatric/Behavioral: Negative.   All other systems reviewed and are negative.      Objective:   Physical Exam  Constitutional: He is oriented to person, place, and time. He appears well-developed and well-nourished.  HENT:  Head: Normocephalic.  Right Ear: External ear normal.  Left Ear: External ear normal.  Nose: Nose normal.  Mouth/Throat: Oropharynx is clear and moist.  Eyes: EOM are normal. Pupils are equal, round, and reactive to light.  Neck: Normal range of motion. Neck supple. No JVD present. No thyromegaly present.  Cardiovascular: Normal rate, regular rhythm, normal heart sounds and intact distal pulses.  Exam reveals no gallop and no friction rub.   No murmur heard. Pulmonary/Chest: Effort normal and breath sounds normal. No respiratory distress. He has no wheezes. He has no rales. He exhibits no tenderness.  Abdominal: Soft. Bowel sounds are normal. He exhibits no mass. There is no tenderness.  Genitourinary:  Refuses rectal exam  Musculoskeletal: Normal range of motion. He exhibits no edema.  Lymphadenopathy:    He has no cervical adenopathy.  Neurological: He is alert and oriented to person, place, and time. No cranial nerve deficit.  Skin: Skin is warm and dry.  Psychiatric: He has a normal mood and affect. His behavior is normal. Judgment and thought content normal.   BP 155/88 mmHg  Pulse 74  Temp(Src) 97.1 F (36.2 C) (Oral)  Ht 5' 9"  (1.753 m)  Wt 239 lb (108.41 kg)  BMI 35.28 kg/m2  hgba1c- 13.0%    Assessment & Plan:  1. Hyperlipidemia Low fat diet - Lipid panel - rosuvastatin (CRESTOR) 40 MG tablet; Take 0.5 tablets (20 mg total) by mouth daily.  Dispense: 45 tablet; Refill: 5 - Choline Fenofibrate (FENOFIBRIC ACID) 135 MG CPDR; Take 1 capsule by mouth daily.  Dispense: 90 capsule; Refill: 1  2. Essential hypertension Do not add salt to diet - CMP14+EGFR - lisinopril (PRINIVIL,ZESTRIL) 10 MG tablet; Take 1 tablet (10 mg total) by mouth  daily.  Dispense: 90 tablet; Refill: 1  3. ST elevation myocardial infarction (STEMI) of inferolateral wall ([place])  4. Atherosclerosis of native coronary artery of native heart with angina pectoris with documented spasm (HCC) - metoprolol tartrate (LOPRESSOR) 25 MG tablet; Take 1 tablet (25 mg total) by mouth 2 (two) times daily.  Dispense: 180 tablet; Refill: 1  5. Acute right ventricular myocardial infarction ([place])  6. Type 2 diabetes mellitus with other circulatory complications ([place]) patient wants to get back under control hisself- will allow bu will recheck in 1 month and see if hgba1c is trending downward. Diary of fasting blood  sugars Strict carb counting - Bayer DCA Hb A1c Waived - sitaGLIPtin-metformin (JANUMET) 50-1000 MG tablet; Take 1 tablet by mouth 2 (two) times daily with a meal.  Dispense: 180 tablet; Refill: 1 - glimepiride (AMARYL) 4 MG tablet; Take 1 tablet (4 mg total) by mouth 2 (two) times daily.  Dispense: 180 tablet; Refill: 1  7. Morbid obesity, unspecified obesity type ([place]) Discussed diet and exercise for person with BMI >25 Will recheck weight in 3-6 months  8. Need for hepatitis C screening test - Hepatitis C antibody  9. Screening for malignant neoplasm of the rectum - Fecal occult blood, imunochemical; Future   Encouraged to get appointment for eye exam Labs pending Health maintenance reviewed Diet and exercise encouraged Continue all meds Follow up  In 1 month   [place], [name]

## 2015-11-26 LAB — CMP14+EGFR
ALBUMIN: 4.7 g/dL (ref 3.6–4.8)
ALK PHOS: 55 IU/L (ref 39–117)
ALT: 42 IU/L (ref 0–44)
AST: 46 IU/L — AB (ref 0–40)
Albumin/Globulin Ratio: 2 (ref 1.2–2.2)
BUN / CREAT RATIO: 16 (ref 10–24)
BUN: 17 mg/dL (ref 8–27)
Bilirubin Total: 0.4 mg/dL (ref 0.0–1.2)
CO2: 24 mmol/L (ref 18–29)
CREATININE: 1.05 mg/dL (ref 0.76–1.27)
Calcium: 9.8 mg/dL (ref 8.6–10.2)
Chloride: 94 mmol/L — ABNORMAL LOW (ref 96–106)
GFR calc Af Amer: 88 mL/min/{1.73_m2} (ref >59)
GFR calc non Af Amer: 76 mL/min/{1.73_m2} (ref >59)
GLUCOSE: 288 mg/dL — AB (ref 65–99)
Globulin, Total: 2.4 g/dL (ref 1.5–4.5)
Potassium: 5.9 mmol/L (ref 3.5–5.2)
Sodium: 136 mmol/L (ref 134–144)
Total Protein: 7.1 g/dL (ref 6.0–8.5)

## 2015-11-26 LAB — LIPID PANEL
CHOLESTEROL TOTAL: 208 mg/dL — AB (ref 100–199)
Chol/HDL Ratio: 13.9 ratio units — ABNORMAL HIGH (ref 0.0–5.0)
HDL: 15 mg/dL — ABNORMAL LOW (ref >39)
TRIGLYCERIDES: 981 mg/dL — AB (ref 0–149)

## 2015-11-26 LAB — HEPATITIS C ANTIBODY: Hep C Virus Ab: <0.1 s/co ratio (ref 0.0–0.9)

## 2015-11-27 ENCOUNTER — Other Ambulatory Visit: Payer: BLUE CROSS/BLUE SHIELD

## 2015-11-27 ENCOUNTER — Telehealth: Payer: Self-pay | Admitting: Nurse Practitioner

## 2015-11-27 DIAGNOSIS — E875 Hyperkalemia: Secondary | ICD-10-CM

## 2015-11-27 NOTE — Telephone Encounter (Signed)
Pt notified of results Verbalizes understanding 

## 2015-11-28 ENCOUNTER — Telehealth: Payer: Self-pay

## 2015-11-28 LAB — CMP14+EGFR
A/G RATIO: 1.7 (ref 1.2–2.2)
ALBUMIN: 4.5 g/dL (ref 3.6–4.8)
ALK PHOS: 54 IU/L (ref 39–117)
ALT: 45 IU/L — AB (ref 0–44)
AST: 62 IU/L — AB (ref 0–40)
BUN/Creatinine Ratio: 23 (ref 10–24)
BUN: 23 mg/dL (ref 8–27)
Bilirubin Total: 0.4 mg/dL (ref 0.0–1.2)
CO2: 23 mmol/L (ref 18–29)
CREATININE: 1.02 mg/dL (ref 0.76–1.27)
Calcium: 9.8 mg/dL (ref 8.6–10.2)
Chloride: 93 mmol/L — ABNORMAL LOW (ref 96–106)
GFR calc Af Amer: 91 mL/min/{1.73_m2} (ref >59)
GFR calc non Af Amer: 79 mL/min/{1.73_m2} (ref >59)
GLOBULIN, TOTAL: 2.7 g/dL (ref 1.5–4.5)
Glucose: 339 mg/dL — ABNORMAL HIGH (ref 65–99)
Potassium: 6 mmol/L (ref 3.5–5.2)
Sodium: 134 mmol/L (ref 134–144)
TOTAL PROTEIN: 7.2 g/dL (ref 6.0–8.5)

## 2015-11-28 NOTE — Telephone Encounter (Signed)
Insurance prior authorized [employer]

## 2015-12-02 ENCOUNTER — Other Ambulatory Visit: Payer: BLUE CROSS/BLUE SHIELD

## 2015-12-02 DIAGNOSIS — E875 Hyperkalemia: Secondary | ICD-10-CM

## 2015-12-03 ENCOUNTER — Other Ambulatory Visit: Payer: Self-pay | Admitting: *Deleted

## 2015-12-03 DIAGNOSIS — E875 Hyperkalemia: Secondary | ICD-10-CM

## 2015-12-03 LAB — CMP14+EGFR
ALK PHOS: 60 IU/L (ref 39–117)
ALT: 52 IU/L — AB (ref 0–44)
AST: 62 IU/L — ABNORMAL HIGH (ref 0–40)
Albumin/Globulin Ratio: 1.6 (ref 1.2–2.2)
Albumin: 4.7 g/dL (ref 3.6–4.8)
BUN/Creatinine Ratio: 16 (ref 10–24)
BUN: 16 mg/dL (ref 8–27)
Bilirubin Total: 0.2 mg/dL (ref 0.0–1.2)
CALCIUM: 10.4 mg/dL — AB (ref 8.6–10.2)
CO2: 20 mmol/L (ref 18–29)
CREATININE: 1.02 mg/dL (ref 0.76–1.27)
Chloride: 95 mmol/L — ABNORMAL LOW (ref 96–106)
GFR calc Af Amer: 91 mL/min/{1.73_m2} (ref >59)
GFR, EST NON AFRICAN AMERICAN: 79 mL/min/{1.73_m2} (ref >59)
GLUCOSE: 378 mg/dL — AB (ref 65–99)
Globulin, Total: 2.9 g/dL (ref 1.5–4.5)
Potassium: 5.4 mmol/L — ABNORMAL HIGH (ref 3.5–5.2)
SODIUM: 138 mmol/L (ref 134–144)
Total Protein: 7.6 g/dL (ref 6.0–8.5)

## 2015-12-10 ENCOUNTER — Other Ambulatory Visit: Payer: BLUE CROSS/BLUE SHIELD

## 2015-12-10 DIAGNOSIS — E875 Hyperkalemia: Secondary | ICD-10-CM

## 2015-12-11 LAB — CMP14+EGFR
ALT: 43 IU/L (ref 0–44)
AST: 50 IU/L — AB (ref 0–40)
Albumin/Globulin Ratio: 1.7 (ref 1.2–2.2)
Albumin: 4.4 g/dL (ref 3.6–4.8)
Alkaline Phosphatase: 54 IU/L (ref 39–117)
BUN/Creatinine Ratio: 18 (ref 10–24)
BUN: 17 mg/dL (ref 8–27)
Bilirubin Total: 0.3 mg/dL (ref 0.0–1.2)
CALCIUM: 10 mg/dL (ref 8.6–10.2)
CO2: 21 mmol/L (ref 18–29)
CREATININE: 0.92 mg/dL (ref 0.76–1.27)
Chloride: 92 mmol/L — ABNORMAL LOW (ref 96–106)
GFR calc Af Amer: 103 mL/min/{1.73_m2} (ref >59)
GFR calc non Af Amer: 89 mL/min/{1.73_m2} (ref >59)
Globulin, Total: 2.6 g/dL (ref 1.5–4.5)
Glucose: 366 mg/dL — ABNORMAL HIGH (ref 65–99)
Potassium: 4.9 mmol/L (ref 3.5–5.2)
SODIUM: 135 mmol/L (ref 134–144)
Total Protein: 7 g/dL (ref 6.0–8.5)

## 2015-12-24 ENCOUNTER — Ambulatory Visit (INDEPENDENT_AMBULATORY_CARE_PROVIDER_SITE_OTHER): Payer: BLUE CROSS/BLUE SHIELD | Admitting: Nurse Practitioner

## 2015-12-24 ENCOUNTER — Encounter: Payer: Self-pay | Admitting: Nurse Practitioner

## 2015-12-24 VITALS — BP 140/87 | HR 76 | Temp 97.2°F | Ht 69.0 in | Wt 234.0 lb

## 2015-12-24 DIAGNOSIS — E1159 Type 2 diabetes mellitus with other circulatory complications: Secondary | ICD-10-CM | POA: Diagnosis not present

## 2015-12-24 LAB — BAYER DCA HB A1C WAIVED: HB A1C: 11.3 % — AB (ref <7.0)

## 2015-12-24 NOTE — Patient Instructions (Signed)

## 2015-12-24 NOTE — Progress Notes (Signed)
   Subjective:    Patient ID: Tyler Garner, male    DOB: Feb 08, 1956, 60 y.o.   MRN: [id number]  HPI Patient comes in today for recheck of diabetes. He retired from work and since then he has let his diabetes get out of control. His Hgba1c was 13% on 11/25/15. He has been watching his diet and is here today for recheck. Fasting blood sugars have been running 120-130 on average- occasional 160-170. He has cut back on breads and has been walking everyday.    Review of Systems  Constitutional: Negative.   HENT: Negative.   Respiratory: Negative.   Cardiovascular: Negative.   Gastrointestinal: Negative.   Genitourinary: Negative.   Neurological: Negative.   Psychiatric/Behavioral: Negative.   All other systems reviewed and are negative.      Objective:   Physical Exam  Constitutional: He is oriented to person, place, and time. He appears well-developed and well-nourished. No distress.  Cardiovascular: Normal rate, regular rhythm and normal heart sounds.   Pulmonary/Chest: Effort normal and breath sounds normal.  Musculoskeletal: Normal range of motion.  Neurological: He is alert and oriented to person, place, and time.  Skin: Skin is warm.  Psychiatric: He has a normal mood and affect. His behavior is normal. Judgment and thought content normal.    BP 140/87   Pulse 76   Temp 97.2 F (36.2 C) (Oral)   Ht 5\' 9"  (1.753 m)   Wt 234 lb (106.1 kg)   BMI 34.56 kg/m   hgba1c 11.3% down from 13.0    Assessment & Plan:   1. Type 2 diabetes mellitus with other circulatory complications (HCC)    Continue to watch diet  exercise daily Recheck in 3 months  [name] Daphine Deutscher, [name]

## 2015-12-25 ENCOUNTER — Ambulatory Visit (INDEPENDENT_AMBULATORY_CARE_PROVIDER_SITE_OTHER): Payer: BLUE CROSS/BLUE SHIELD | Admitting: Cardiology

## 2015-12-25 ENCOUNTER — Encounter: Payer: Self-pay | Admitting: Cardiology

## 2015-12-25 VITALS — BP 118/64 | HR 80 | Ht 69.5 in | Wt 233.0 lb

## 2015-12-25 DIAGNOSIS — I2510 Atherosclerotic heart disease of native coronary artery without angina pectoris: Secondary | ICD-10-CM

## 2015-12-25 DIAGNOSIS — I10 Essential (primary) hypertension: Secondary | ICD-10-CM

## 2015-12-25 NOTE — Progress Notes (Signed)
HPI The patient presents for followup of coronary artery disease with myocardial infarction in January of 2015 with drug-eluting stent placement to the circumflex.  He had a POET (Plain Old Exercise Treadmill) last year and this was negative for evidence of ischemia.  Since I last saw him he's had no cardiovascular complaints. He quit work. Some weight. His A1c which was 8-1/2 went up about 13. His triglycerides were extremely elevated. He wasn't really participating in risk reduction. However, more recently he brought his A1c back down to 11. He lost the weight back. He had lipids done yesterday and triglycerides are pending. He is walking daily. He is active doing gardening.  The patient denies any new symptoms such as chest discomfort, neck or arm discomfort. There has been no new shortness of breath, PND or orthopnea. There have been no reported palpitations, presyncope or syncope.  Allergies  Allergen Reactions  . Percocet [Oxycodone-Acetaminophen] Nausea And Vomiting    Current Outpatient Prescriptions  Medication Sig Dispense Refill  . aspirin 81 MG tablet Take 81 mg by mouth daily.      . Choline Fenofibrate (FENOFIBRIC ACID) 135 MG CPDR Take 1 capsule by mouth daily. 90 capsule 1  . glimepiride (AMARYL) 4 MG tablet Take 1 tablet (4 mg total) by mouth 2 (two) times daily. 180 tablet 1  . lisinopril (PRINIVIL,ZESTRIL) 10 MG tablet Take 1 tablet (10 mg total) by mouth daily. 90 tablet 1  . metoprolol tartrate (LOPRESSOR) 25 MG tablet Take 1 tablet (25 mg total) by mouth 2 (two) times daily. 180 tablet 1  . rosuvastatin (CRESTOR) 40 MG tablet Take 0.5 tablets (20 mg total) by mouth daily. 45 tablet 5  . sitaGLIPtin-metformin (JANUMET) 50-1000 MG tablet Take 1 tablet by mouth 2 (two) times daily with a meal. 180 tablet 1   No current facility-administered medications for this visit.     Past Medical History:  Diagnosis Date  . CAD (coronary artery disease)    a. inferior STEMI  (05/2013) with RV involvement- LHC:  dist CFX occluded (Promus premier (2.5x24 mm) DES); EF 40-45% with inf HK;  b. Echo (report pending) with EF 45-50%, lat WMA, RVE, mod RV hypokinesis   . Diabetes mellitus without complication (HCC)   . Hyperglycemia   . Hyperlipidemia   . Hypertension   . Ischemic cardiomyopathy   . Nicotine addiction   . Renal colic   . Right shoulder injury 10/11/09    Past Surgical History:  Procedure Laterality Date  .  heart stent    . LEFT HEART CATHETERIZATION WITH CORONARY ANGIOGRAM N/A 06/21/2013   Procedure: LEFT HEART CATHETERIZATION WITH CORONARY ANGIOGRAM;  Surgeon: Peter M Swaziland, MD;  Location: [facility] CATH LAB;  Service: Cardiovascular;  Laterality: N/A;    ROS:  As stated in the HPI and negative for all other systems.  PHYSICAL EXAM BP 118/64   Pulse 80   Ht 5' 9.5" (1.765 m)   Wt 233 lb (105.7 kg)   BMI 33.91 kg/m  GENERAL:  Well appearing NECK:  No jugular venous distention, waveform within normal limits, carotid upstroke brisk and symmetric, no bruits, no thyromegaly LUNGS:  Clear to auscultation bilaterally BACK:  No CVA tenderness CHEST:  Unremarkable HEART:  PMI not displaced or sustained,S1 and S2 within normal limits, no S3, no S4, no clicks, no rubs, no murmurs ABD:  Obese, positive bowel sounds normal in frequency in pitch, no bruits, no rebound, no guarding, no midline pulsatile mass, no hepatomegaly,  no splenomegaly EXT:  2 plus pulses throughout, no edema, no cyanosis no clubbing SKIN:  No rashes no nodules  Lab Results  Component Value Date   CHOL 208 (H) 11/25/2015   TRIG 981 (HH) 11/25/2015   HDL 15 (L) 11/25/2015   LDLCALC Comment 11/25/2015    EKG:  Sinus rhythm, rate 82, axis within normal limits, intervals within normal limits, no acute ST-T wave changes.  Old inferior infract.  12/25/2015   ASSESSMENT AND PLAN  CAD: The patient has no new sypmtoms.  No further cardiovascular testing is indicated.   ISCHEMIC  CARDIOMYOPATHY (EF 45 - 50%) He has had no symptoms.  No further imaging is indicated.  No change in therapy is planned.   OVERWEIGHT: He is encouraged to continue with weight loss.   DYSLIPIDEMIA:  This is managed by Bennie Pierini, [name] .  In his situation the combination statin fenofibric acid is indicated given his significantly elevated triglycerides.  DM:   We discussed the critical importance of managing this given the very elevated readings. I think he's not better trajectory. Of note he's been drinking sugar sweet tea and he will stop this.

## 2015-12-25 NOTE — Patient Instructions (Signed)
Medication Instructions:  The current medical regimen is effective;  continue present plan and medications.  Follow-Up: Follow up in 1 year with Dr. Hochrein in [place].  You will receive a letter in the mail 2 months before you are due.  Please call us when you receive this letter to schedule your follow up appointment.  If you need a refill on your cardiac medications before your next appointment, please call your pharmacy.  Thank you for choosing [facility]!!     

## 2016-01-14 ENCOUNTER — Encounter: Payer: Self-pay | Admitting: Physician Assistant

## 2016-01-14 ENCOUNTER — Ambulatory Visit (INDEPENDENT_AMBULATORY_CARE_PROVIDER_SITE_OTHER): Payer: BLUE CROSS/BLUE SHIELD | Admitting: Physician Assistant

## 2016-01-14 VITALS — BP 138/86 | HR 90 | Temp 97.8°F | Ht 69.5 in | Wt 233.4 lb

## 2016-01-14 DIAGNOSIS — R350 Frequency of micturition: Secondary | ICD-10-CM | POA: Diagnosis not present

## 2016-01-14 DIAGNOSIS — R8290 Unspecified abnormal findings in urine: Secondary | ICD-10-CM | POA: Diagnosis not present

## 2016-01-14 DIAGNOSIS — N3001 Acute cystitis with hematuria: Secondary | ICD-10-CM | POA: Diagnosis not present

## 2016-01-14 DIAGNOSIS — Z6833 Body mass index (BMI) 33.0-33.9, adult: Secondary | ICD-10-CM | POA: Diagnosis not present

## 2016-01-14 DIAGNOSIS — N390 Urinary tract infection, site not specified: Secondary | ICD-10-CM

## 2016-01-14 LAB — MICROSCOPIC EXAMINATION

## 2016-01-14 LAB — URINALYSIS, COMPLETE
Bilirubin, UA: NEGATIVE
NITRITE UA: NEGATIVE
Protein, UA: NEGATIVE
Specific Gravity, UA: 1.025 (ref 1.005–1.030)
Urobilinogen, Ur: 0.2 mg/dL (ref 0.2–1.0)
pH, UA: 5.5 (ref 5.0–7.5)

## 2016-01-14 MED ORDER — SULFAMETHOXAZOLE-TRIMETHOPRIM 800-160 MG PO TABS: 1.0000 | ORAL_TABLET | Freq: Two times a day (BID) | ORAL | 0 refills | Status: DC

## 2016-01-14 MED ORDER — FLUCONAZOLE 150 MG PO TABS: 150.0000 mg | ORAL_TABLET | Freq: Once | ORAL | 0 refills | Status: AC

## 2016-01-14 NOTE — Progress Notes (Signed)
Subjective:    Tyler Garner is a 60 y.o. male who complains of abnormal smelling urine, dysuria, frequency and suprapubic pressure. He has had symptoms for 3 days. Patient also complains of back pain. Patient denies fever. Patient does not have a history of recurrent UTI. Patient does not have a history of pyelonephritis.   The following portions of the patient's history were reviewed and updated as appropriate: past family history, past social history and past surgical history.  Review of Systems A comprehensive review of systems was negative.      Objective:    BP 138/86 (BP Location: Left Arm, Patient Position: Sitting, Cuff Size: Large)   Pulse 90   Temp 97.8 F (36.6 C) (Oral)   Ht 5' 9.5" (1.765 m)   Wt 233 lb 6.4 oz (105.9 kg)   BMI 33.97 kg/m   General Appearance:    Alert, cooperative, no distress, appears stated age  Head:    Normocephalic, without obvious abnormality, atraumatic  Eyes:    PERRL, conjunctiva/corneas clear, EOM's intact, fundi    benign, both eyes       Ears:    Normal TM's and external ear canals, both ears           Back:     Symmetric, no curvature, ROM normal, no CVA tenderness  Lungs:     Clear to auscultation bilaterally, respirations unlabored  Chest wall:    No tenderness or deformity  Heart:    Regular rate and rhythm, S1 and S2 normal, no murmur, rub   or gallop  Abdomen:     Soft, non-tender, bowel sounds active all four quadrants,    no masses, no organomegaly  Genitalia:    Normal male without lesion, discharge or tenderness           Skin:   Skin color, texture, turgor normal, no rashes or lesions    Laboratory:  Urine dipstick: exam performed through lab and documented.   Micro exam: 5-10 RBCs per HPF and 0-5+ bacteria.    Assessment:   Assessment and Plan  1. Frequent urination - Urinalysis, Complete - Urine culture - fluconazole (DIFLUCAN) 150 MG tablet; Take 1 tablet (150 mg total) by mouth once.  Dispense: 3 tablet;  Refill: 0  2. Body mass index 33.0-33.9, adult Diet and exercise, walking daily, and BP is improved  3. Acute cystitis with hematuria - sulfamethoxazole-trimethoprim (BACTRIM DS) 800-160 MG tablet; Take 1 tablet by mouth 2 (two) times daily.  Dispense: 14 tablet; Refill: 0  4. Abnormal urinalysis Positive for yeast therefore diflucan will be taken along woith the antibiotic

## 2016-01-14 NOTE — Patient Instructions (Addendum)
Urinary Tract Infection Urinary tract infections (UTIs) can develop anywhere along your urinary tract. Your urinary tract is your body's drainage system for removing wastes and extra water. Your urinary tract includes two kidneys, two ureters, a bladder, and a urethra. Your kidneys are a pair of bean-shaped organs. Each kidney is about the size of your fist. They are located below your ribs, one on each side of your spine. CAUSES Infections are caused by microbes, which are microscopic organisms, including fungi, viruses, and bacteria. These organisms are so small that they can only be seen through a microscope. Bacteria are the microbes that most commonly cause UTIs. SYMPTOMS  Symptoms of UTIs may vary by age and gender of the patient and by the location of the infection. Symptoms in young women typically include a frequent and intense urge to urinate and a painful, burning feeling in the bladder or urethra during urination. Older women and men are more likely to be tired, shaky, and weak and have muscle aches and abdominal pain. A fever may mean the infection is in your kidneys. Other symptoms of a kidney infection include pain in your back or sides below the ribs, nausea, and vomiting. DIAGNOSIS To diagnose a UTI, your caregiver will ask you about your symptoms. Your caregiver will also ask you to provide a urine sample. The urine sample will be tested for bacteria and white blood cells. White blood cells are made by your body to help fight infection. TREATMENT  Typically, UTIs can be treated with medication. Because most UTIs are caused by a bacterial infection, they usually can be treated with the use of antibiotics. The choice of antibiotic and length of treatment depend on your symptoms and the type of bacteria causing your infection. HOME CARE INSTRUCTIONS  If you were prescribed antibiotics, take them exactly as your caregiver instructs you. Finish the medication even if you feel better after  you have only taken some of the medication.  Drink enough water and fluids to keep your urine clear or pale yellow.  Avoid caffeine, tea, and carbonated beverages. They tend to irritate your bladder.  Empty your bladder often. Avoid holding urine for long periods of time.  Empty your bladder before and after sexual intercourse.  After a bowel movement, women should cleanse from front to back. Use each tissue only once. SEEK MEDICAL CARE IF:   You have back pain.  You develop a fever.  Your symptoms do not begin to resolve within 3 days. SEEK IMMEDIATE MEDICAL CARE IF:   You have severe back pain or lower abdominal pain.  You develop chills.  You have nausea or vomiting.  You have continued burning or discomfort with urination. MAKE SURE YOU:   Understand these instructions.  Will watch your condition.  Will get help right away if you are not doing well or get worse.   This information is not intended to replace advice given to you by your health care provider. Make sure you discuss any questions you have with your health care provider.   Document Released: 02/18/2005 Document Revised: 01/30/2015 Document Reviewed: 06/19/2011 Elsevier Interactive Patient Education 2016 [employer].   Place urinary tract infection patient instructions here.

## 2016-01-15 ENCOUNTER — Ambulatory Visit: Payer: BLUE CROSS/BLUE SHIELD | Admitting: Cardiology

## 2016-01-16 LAB — URINE CULTURE

## 2016-03-03 ENCOUNTER — Other Ambulatory Visit: Payer: Self-pay | Admitting: Nurse Practitioner

## 2016-04-07 ENCOUNTER — Encounter: Payer: Self-pay | Admitting: Nurse Practitioner

## 2016-04-07 ENCOUNTER — Ambulatory Visit (INDEPENDENT_AMBULATORY_CARE_PROVIDER_SITE_OTHER): Payer: BLUE CROSS/BLUE SHIELD | Admitting: Nurse Practitioner

## 2016-04-07 VITALS — BP 121/78 | HR 73 | Temp 97.4°F | Ht 69.5 in | Wt 227.0 lb

## 2016-04-07 DIAGNOSIS — Z23 Encounter for immunization: Secondary | ICD-10-CM

## 2016-04-07 DIAGNOSIS — I25111 Atherosclerotic heart disease of native coronary artery with angina pectoris with documented spasm: Secondary | ICD-10-CM

## 2016-04-07 DIAGNOSIS — I10 Essential (primary) hypertension: Secondary | ICD-10-CM | POA: Diagnosis not present

## 2016-04-07 DIAGNOSIS — E1165 Type 2 diabetes mellitus with hyperglycemia: Secondary | ICD-10-CM | POA: Diagnosis not present

## 2016-04-07 DIAGNOSIS — E782 Mixed hyperlipidemia: Secondary | ICD-10-CM

## 2016-04-07 LAB — CMP14+EGFR
A/G RATIO: 1.8 (ref 1.2–2.2)
ALBUMIN: 4.5 g/dL (ref 3.6–4.8)
ALK PHOS: 51 IU/L (ref 39–117)
ALT: 38 IU/L (ref 0–44)
AST: 51 IU/L — AB (ref 0–40)
BILIRUBIN TOTAL: 0.3 mg/dL (ref 0.0–1.2)
BUN / CREAT RATIO: 18 (ref 10–24)
BUN: 17 mg/dL (ref 8–27)
CHLORIDE: 96 mmol/L (ref 96–106)
CO2: 22 mmol/L (ref 18–29)
Calcium: 10.3 mg/dL — ABNORMAL HIGH (ref 8.6–10.2)
Creatinine, Ser: 0.95 mg/dL (ref 0.76–1.27)
GFR calc Af Amer: 99 mL/min/{1.73_m2} (ref >59)
GFR calc non Af Amer: 86 mL/min/{1.73_m2} (ref >59)
GLOBULIN, TOTAL: 2.5 g/dL (ref 1.5–4.5)
Glucose: 279 mg/dL — ABNORMAL HIGH (ref 65–99)
POTASSIUM: 4.9 mmol/L (ref 3.5–5.2)
SODIUM: 137 mmol/L (ref 134–144)
Total Protein: 7 g/dL (ref 6.0–8.5)

## 2016-04-07 LAB — LIPID PANEL
CHOLESTEROL TOTAL: 213 mg/dL — AB (ref 100–199)
Chol/HDL Ratio: 10.7 ratio units — ABNORMAL HIGH (ref 0.0–5.0)
HDL: 20 mg/dL — ABNORMAL LOW (ref >39)
Triglycerides: 808 mg/dL (ref 0–149)

## 2016-04-07 LAB — BAYER DCA HB A1C WAIVED: HB A1C (BAYER DCA - WAIVED): 11.6 % — ABNORMAL HIGH (ref <7.0)

## 2016-04-07 MED ORDER — SITAGLIPTIN PHOS-METFORMIN HCL 50-1000 MG PO TABS: 1.0000 | ORAL_TABLET | Freq: Two times a day (BID) | ORAL | 1 refills | Status: DC

## 2016-04-07 MED ORDER — LISINOPRIL 10 MG PO TABS: 10.0000 mg | ORAL_TABLET | Freq: Every day | ORAL | 1 refills | Status: DC

## 2016-04-07 MED ORDER — INSULIN GLARGINE 100 UNIT/ML SOLOSTAR PEN: 20.0000 [IU] | PEN_INJECTOR | Freq: Every day | SUBCUTANEOUS | 11 refills | Status: DC

## 2016-04-07 MED ORDER — PEN NEEDLES 31G X 8 MM MISC: 2 refills | Status: DC

## 2016-04-07 MED ORDER — FENOFIBRIC ACID 135 MG PO CPDR: 1.0000 | DELAYED_RELEASE_CAPSULE | Freq: Every day | ORAL | 1 refills | Status: DC

## 2016-04-07 MED ORDER — METOPROLOL TARTRATE 25 MG PO TABS: 25.0000 mg | ORAL_TABLET | Freq: Two times a day (BID) | ORAL | 1 refills | Status: DC

## 2016-04-07 MED ORDER — GLIMEPIRIDE 4 MG PO TABS: 4.0000 mg | ORAL_TABLET | Freq: Two times a day (BID) | ORAL | 1 refills | Status: DC

## 2016-04-07 MED ORDER — ROSUVASTATIN CALCIUM 40 MG PO TABS: 20.0000 mg | ORAL_TABLET | Freq: Every day | ORAL | 5 refills | Status: DC

## 2016-04-07 NOTE — Progress Notes (Signed)
Subjective:    Patient ID: Tyler Garner, male    DOB: 01/26/1956, 60 y.o.   MRN: [id number]   Patient here today for follow up of chronic medical problems.  Outpatient Encounter Prescriptions as of 04/07/2016  Medication Sig  . aspirin 81 MG tablet Take 81 mg by mouth daily.    . Choline Fenofibrate (FENOFIBRIC ACID) 135 MG CPDR Take 1 capsule by mouth daily.  Marland Kitchen glimepiride (AMARYL) 4 MG tablet Take 1 tablet (4 mg total) by mouth 2 (two) times daily.  Marland Kitchen glimepiride (AMARYL) 4 MG tablet TAKE 1 TABLET TWICE A DAY  . lisinopril (PRINIVIL,ZESTRIL) 10 MG tablet Take 1 tablet (10 mg total) by mouth daily.  . metoprolol tartrate (LOPRESSOR) 25 MG tablet Take 1 tablet (25 mg total) by mouth 2 (two) times daily.  . rosuvastatin (CRESTOR) 40 MG tablet Take 0.5 tablets (20 mg total) by mouth daily.  . sitaGLIPtin-metformin (JANUMET) 50-1000 MG tablet Take 1 tablet by mouth 2 (two) times daily with a meal.    Hypertension  This is a chronic problem. The problem has been waxing and waning since onset. The problem is uncontrolled. Pertinent negatives include no headaches, palpitations or shortness of breath. Risk factors for coronary artery disease include obesity, male gender, dyslipidemia and diabetes mellitus. Past treatments include ACE inhibitors and beta blockers. Hypertensive end-organ damage includes CVA (stent placement heart).  Hyperlipidemia  This is a chronic problem. The current episode started more than 1 year ago. The problem is uncontrolled. Recent lipid tests were reviewed and are high. Exacerbating diseases include diabetes and obesity. He has no history of hypothyroidism. Pertinent negatives include no myalgias or shortness of breath. Current antihyperlipidemic treatment includes statins. The current treatment provides mild improvement of lipids. Compliance problems include adherence to diet and adherence to exercise.  Risk factors for coronary artery disease include dyslipidemia, family  history, hypertension, male sex and obesity.  Diabetes  He presents for his follow-up diabetic visit. He has type 2 diabetes mellitus. No MedicAlert identification noted. His disease course has been fluctuating. Pertinent negatives for hypoglycemia include no headaches. Pertinent negatives for diabetes include no fatigue, no polydipsia, no polyphagia, no polyuria and no visual change. Diabetic complications include a CVA (stent placement heart). Current diabetic treatment includes oral agent (triple therapy). He is compliant with treatment most of the time. His weight is stable. He is following a high fat/cholesterol diet. When asked about meal planning, he reported none. He has not had a previous visit with a dietitian. He rarely participates in exercise. His home blood glucose trend is increasing rapidly (patient has been checking blood sugars 2x a day). His breakfast blood glucose is taken between 8-9 am. His breakfast blood glucose range is generally 130-140 mg/dl. His overall blood glucose range is 130-140 mg/dl. An ACE inhibitor/angiotensin II receptor blocker is being taken. He does not see a podiatrist.Eye exam is not current.  Hx of stent placement/CAD/Hx stemi Is doing well since stent placement. No c/o shortness of breath.    Review of Systems  Constitutional: Negative.  Negative for fatigue.  HENT: Negative.   Eyes: Negative.   Respiratory: Negative.  Negative for shortness of breath.   Cardiovascular: Negative.  Negative for palpitations.  Gastrointestinal: Negative.   Endocrine: Negative.  Negative for polydipsia, polyphagia and polyuria.  Genitourinary: Negative.  Negative for hematuria, penile swelling and urgency.  Musculoskeletal: Negative.  Negative for myalgias.  Skin: Negative.   Allergic/Immunologic: Negative.   Neurological: Negative.  Negative for  headaches.  Hematological: Negative.   Psychiatric/Behavioral: Negative.   All other systems reviewed and are  negative.      Objective:   Physical Exam  Constitutional: He is oriented to person, place, and time. He appears well-developed and well-nourished.  HENT:  Head: Normocephalic.  Right Ear: External ear normal.  Left Ear: External ear normal.  Nose: Nose normal.  Mouth/Throat: Oropharynx is clear and moist.  Eyes: EOM are normal. Pupils are equal, round, and reactive to light.  Neck: Normal range of motion. Neck supple. No JVD present. No thyromegaly present.  Cardiovascular: Normal rate, regular rhythm, normal heart sounds and intact distal pulses.  Exam reveals no gallop and no friction rub.   No murmur heard. Pulmonary/Chest: Effort normal and breath sounds normal. No respiratory distress. He has no wheezes. He has no rales. He exhibits no tenderness.  Abdominal: Soft. Bowel sounds are normal. He exhibits no mass. There is no tenderness.  Genitourinary:  Genitourinary Comments: Refuses rectal exam  Musculoskeletal: Normal range of motion. He exhibits no edema.  Lymphadenopathy:    He has no cervical adenopathy.  Neurological: He is alert and oriented to person, place, and time. No cranial nerve deficit.  Skin: Skin is warm and dry.  Psychiatric: He has a normal mood and affect. His behavior is normal. Judgment and thought content normal.   BP 121/78   Pulse 73   Temp 97.4 F (36.3 C) (Oral)   Ht 5' 9.5" (1.765 m)   Wt 227 lb (103 kg)   BMI 33.04 kg/m    hgba1c- 11.6    Assessment & Plan:  1. Essential hypertension Low sodium diet - CMP14+EGFR - lisinopril (PRINIVIL,ZESTRIL) 10 MG tablet; Take 1 tablet (10 mg total) by mouth daily.  Dispense: 90 tablet; Refill: 1  2. Hyperlipidemia, unspecified hyperlipidemia type Low fat diet - Lipid panel - rosuvastatin (CRESTOR) 40 MG tablet; Take 0.5 tablets (20 mg total) by mouth daily.  Dispense: 45 tablet; Refill: 5 - Choline Fenofibrate (FENOFIBRIC ACID) 135 MG CPDR; Take 1 capsule by mouth daily.  Dispense: 90 capsule;  Refill: 1    3. Atherosclerosis of native coronary artery of native heart with angina pectoris with documented spasm ([place]) Keep follow up with cardiology - metoprolol tartrate (LOPRESSOR) 25 MG tablet; Take 1 tablet (25 mg total) by mouth 2 (two) times daily.  Dispense: 180 tablet; Refill: 1  4. Type 2 diabetes mellitus with hyperglycemia, without long-term current use of insulin (HCC) added lantus to meds today - Bayer DCA Hb A1c Waived - Insulin Glargine (LANTUS SOLOSTAR) 100 UNIT/ML Solostar Pen; Inject 20 Units into the skin daily at 10 pm.  Dispense: 15 mL; Refill: 11 - sitaGLIPtin-metformin (JANUMET) 50-1000 MG tablet; Take 1 tablet by mouth 2 (two) times daily with a meal.  Dispense: 180 tablet; Refill: 1 - glimepiride (AMARYL) 4 MG tablet; Take 1 tablet (4 mg total) by mouth 2 (two) times daily.  Dispense: 180 tablet; Refill: 1  5. Morbid obesity ([place]) Discussed diet and exercise for person with BMI >25 Will recheck weight in 3-6 months    Labs pending Health maintenance reviewed Diet and exercise encouraged Continue all meds Follow up  In 3 month   [place], [name]

## 2016-04-07 NOTE — Patient Instructions (Signed)
Insulin Degludec injection What is this medicine? INSULIN DEGLUDEC (IN su lin de GLOO dek) is a human-made form of insulin. This drug lowers the amount of sugar in your blood. It is a long-acting insulin that is usually given once a day. COMMON BRAND NAME(S): Evaristo Buryresiba What should I tell my health care provider before I take this medicine? They need to know if you have any of these conditions: -episodes of hypoglycemia -kidney disease -liver disease -an unusual or allergic reaction to insulin, other medicines, foods, dyes, or preservatives -pregnant or trying to get pregnant -breast-feeding How should I use this medicine? This medicine is for injection under the skin. Use exactly as directed. This insulin should never be mixed in the same syringe with other insulins before injection. Do not vigorously shake before use. You will be taught how to use this medicine and how to adjust doses for activities and illness. Do not use more insulin than prescribed. Always check the appearance of your insulin before using it. This medicine should be clear and colorless like water. Do not use it if it is cloudy, thickened, colored, or has solid particles in it. It is important that you put your used needles and syringes in a special sharps container. Do not put them in a trash can. If you do not have a sharps container, call your pharmacist or healthcare provider to get one. Talk to your pediatrician regarding the use of this medicine in children. While this drug may be prescribed for children as young as 1 year for selected conditions, precautions do apply. What if I miss a dose? For adults: If you miss a dose, take it as soon as you can. Make sure your next dose is taken at least 8 hours later. Do not take double or extra doses. For adolescents and children: It is important not to miss a dose. Your health care professional or doctor should discuss a plan for missed doses with you. If you do miss a dose, follow  their plan. Do not take double doses. What may interact with this medicine? -other medicines for diabetes Many medications may cause an increase or decrease in blood sugar, these include: -alcohol containing beverages -aspirin and aspirin-like drugs -certain medicines for HIV like indinavir, lopinavir; ritonavir, nelfinavir, ritonavir, saquinavir -diuretics -male hormones, like estrogens or progestins and birth control pills -heart medicines -isoniazid -medicines for allergies, asthma, cold, or cough -medicines for mental problems -medicines called MAO Inhibitors like Nardil, Parnate, Marplan, Eldepryl -niacin -octreotide -pentamidine -quinolone antibiotics like ciprofloxacin, levofloxacin, ofloxacin -some herbal dietary supplements -steroid medicines like prednisone or cortisone -sulfasalazine -sulfamethoxazole; trimethoprim -thyroid medicine Some medications can hide the warning symptoms of low blood sugar. You may need to monitor your blood sugar more closely if you are taking one of these medications. These include: -beta-blockers such as atenolol, metoprolol, propranolol -clonidine -guanethidine -reserpine What should I watch for while using this medicine? Visit your health care professional or doctor for regular checks on your progress. A test called the HbA1C (A1C) will be monitored. This is a simple blood test. It measures your blood sugar control over the last 2 to 3 months. You will receive this test every 3 to 6 months. Learn how to check your blood sugar. Learn the symptoms of low and high blood sugar and how to manage them. Always carry a quick-source of sugar with you in case you have symptoms of low blood sugar. Examples include hard sugar candy or glucose tablets. Make sure others know that  you can choke if you eat or drink when you develop serious symptoms of low blood sugar, such as seizures or unconsciousness. They must get medical help at once. Tell your doctor  or health care professional if you have high blood sugar. You might need to change the dose of your medicine. If you are sick or exercising more than usual, you might need to change the dose of your medicine. Do not skip meals. Ask your doctor or health care professional if you should avoid alcohol. Many nonprescription cough and cold products contain sugar or alcohol. These can affect blood sugar. Make sure that you have the right kind of syringe for the type of insulin you use. Try not to change the brand and type of insulin or syringe unless your health care professional or doctor tells you to. Switching insulin brand or type can cause dangerously high or low blood sugar. Always keep an extra supply of insulin, syringes, and needles on hand. Use a syringe one time only. Throw away syringe and needle in a closed container to prevent accidental needle sticks. Insulin pens and cartridges should never be shared. Even if the needle is changed, sharing may result in passing of viruses like hepatitis or HIV. Wear a medical ID bracelet or chain, and carry a card that describes your disease and details of your medicine and dosage times. What side effects may I notice from receiving this medicine? Side effects that you should report to your doctor or health care professional as soon as possible: -allergic reactions like skin rash, itching or hives, swelling of the face, lips, or tongue -breathing problems -signs and symptoms of high blood sugar such as dizziness, dry mouth, dry skin, fruity breath, nausea, stomach pain, increased hunger or thirst, increased urination -signs and symptoms of low blood sugar such as feeling anxious, confusion, dizziness, increased hunger, unusually weak or tired, sweating, shakiness, cold, irritable, headache, blurred vision, fast heartbeat, loss of consciousness Side effects that usually do not require medical attention (report to your doctor or health care professional if they  continue or are bothersome): -increase or decrease in fatty tissue under the skin due to overuse of a particular injection site -itching, burning, swelling, or rash at site where injected Where should I keep my medicine? Keep out of the reach of children. Store Mattel in a refrigerator between 2 and 8 degrees C (36 and 46 degrees F.) Do not freeze or use if the insulin has been frozen. Once opened, the pens should be kept at room temperature, below 30 degrees C (86 degrees F). Do not store in the refrigerator once opened. Once opened, the insulin can be used for 56 days. After 56 days, the Flextouch Pen should be thrown away. Protect from light and excessive heat. Throw away any unused medicine after the expiration date or after the specified time for room temperature storage has passed.  09/30/2015 Elsevier/Gold Standard (2015-06-13 09:55:51)

## 2016-06-12 ENCOUNTER — Encounter: Payer: Self-pay | Admitting: Pediatrics

## 2016-06-12 ENCOUNTER — Ambulatory Visit (INDEPENDENT_AMBULATORY_CARE_PROVIDER_SITE_OTHER): Payer: BLUE CROSS/BLUE SHIELD | Admitting: Pediatrics

## 2016-06-12 ENCOUNTER — Encounter (INDEPENDENT_AMBULATORY_CARE_PROVIDER_SITE_OTHER): Payer: Self-pay

## 2016-06-12 VITALS — BP 137/89 | HR 76 | Temp 97.3°F | Ht 69.5 in | Wt 233.4 lb

## 2016-06-12 DIAGNOSIS — B9789 Other viral agents as the cause of diseases classified elsewhere: Secondary | ICD-10-CM | POA: Diagnosis not present

## 2016-06-12 DIAGNOSIS — J069 Acute upper respiratory infection, unspecified: Secondary | ICD-10-CM

## 2016-06-12 MED ORDER — GUAIFENESIN-CODEINE 100-10 MG/5ML PO SOLN: 5.0000 mL | Freq: Three times a day (TID) | ORAL | 0 refills | Status: DC | PRN

## 2016-06-12 NOTE — Patient Instructions (Signed)
Netipot with distilled water 2-3 times a day to clear out sinuses Or Normal saline nasal spray Flonase steroid nasal spray Antihistamine daily such as cetirizine daily Tylenol regularly Lots of fluids

## 2016-06-12 NOTE — Progress Notes (Signed)
  Subjective:   Patient ID: Tyler Garner, male    DOB: [birth year], 61 y.o.   MRN: [id number] CC: Cough; Sneezing; and Headache  HPI: Tyler Garner is a 61 y.o. male presenting for Cough; Sneezing; and Headache  4-5 days of being sick Coughing the whole time Low grade fever during the day Taking alkaseltzer plus, dayquil Appetite fine, drinking plenty of fluids  Relevant past medical, surgical, family and social history reviewed. Allergies and medications reviewed and updated. History  Smoking Status  . Former Smoker  Smokeless Tobacco  . Former Neurosurgeon   ROS: Per HPI   Objective:    BP 137/89   Pulse 76   Temp 97.3 F (36.3 C) (Oral)   Ht 5' 9.5" (1.765 m)   Wt 233 lb 6.4 oz (105.9 kg)   BMI 33.97 kg/m   Wt Readings from Last 3 Encounters:  06/12/16 233 lb 6.4 oz (105.9 kg)  04/07/16 227 lb (103 kg)  01/14/16 233 lb 6.4 oz (105.9 kg)    Gen: NAD, alert, cooperative with exam, NCAT EYES: EOMI, no conjunctival injection, or no icterus ENT:  TMs pink b/l, dull, OP without erythema LYMPH: one apprx 1 cm posterior L cervical LAD CV: NRRR, normal S1/S2, no murmur, distal pulses 2+ b/l Resp: CTABL, no wheezes, normal WOB Abd: +BS, soft, NTND. no guarding or organomegaly Ext: No edema, warm Neuro: Alert and oriented  Assessment & Plan:  Tyler Garner was seen today for cough, sneezing and headache.  Diagnoses and all orders for this visit:  Viral URI with cough Discussed symptomatic care Below prn, no driving, will cause sleepiness -     guaiFENesin-codeine 100-10 MG/5ML syrup; Take 5-10 mLs by mouth 3 (three) times daily as needed for cough.  Follow up plan: prn Rex Kras, MD [place] [facility]

## 2016-07-13 ENCOUNTER — Ambulatory Visit (INDEPENDENT_AMBULATORY_CARE_PROVIDER_SITE_OTHER): Payer: BLUE CROSS/BLUE SHIELD | Admitting: Pediatrics

## 2016-07-13 ENCOUNTER — Encounter (INDEPENDENT_AMBULATORY_CARE_PROVIDER_SITE_OTHER): Payer: Self-pay

## 2016-07-13 ENCOUNTER — Encounter: Payer: Self-pay | Admitting: Pediatrics

## 2016-07-13 VITALS — BP 134/74 | HR 74 | Temp 98.1°F | Ht 69.5 in | Wt 233.0 lb

## 2016-07-13 DIAGNOSIS — J019 Acute sinusitis, unspecified: Secondary | ICD-10-CM

## 2016-07-13 DIAGNOSIS — J069 Acute upper respiratory infection, unspecified: Secondary | ICD-10-CM | POA: Diagnosis not present

## 2016-07-13 MED ORDER — AMOXICILLIN-POT CLAVULANATE 875-125 MG PO TABS: 1.0000 | ORAL_TABLET | Freq: Two times a day (BID) | ORAL | 0 refills | Status: DC

## 2016-07-13 NOTE — Patient Instructions (Signed)
Netipot with distilled water 2-3 times a day to clear out sinuses Or Normal saline nasal spray Flonase steroid nasal spray Antihistamine daily such as cetirizine Lots of fluids  

## 2016-07-13 NOTE — Progress Notes (Signed)
  Subjective:   Patient ID: Tyler Garner, male    DOB: 04/25/1956, 61 y.o.   MRN: [id number] CC: Sinus Pressure; Headache; and Cough  HPI: Tyler Garner is a 61 y.o. male presenting for Sinus Pressure; Headache; and Cough  Lots of facial pain R side of face with lots of pressure, swelling, slightly red Teeth hurting Fevers some last week None past few days Feeling slightly better today Has been using ice on his face has helped Started getting sick 4 days ago Had R upper tooth pain at first, has gotten better  Relevant past medical, surgical, family and social history reviewed. Allergies and medications reviewed and updated. History  Smoking Status  . Former Smoker  Smokeless Tobacco  . Former Neurosurgeon   ROS: Per HPI   Objective:    BP 134/74   Pulse 74   Temp 98.1 F (36.7 C) (Oral)   Ht 5' 9.5" (1.765 m)   Wt 233 lb (105.7 kg)   BMI 33.91 kg/m   Wt Readings from Last 3 Encounters:  07/13/16 233 lb (105.7 kg)  06/12/16 233 lb 6.4 oz (105.9 kg)  04/07/16 227 lb (103 kg)    Gen: NAD, alert, cooperative with exam, NCAT EYES: EOMI, no conjunctival injection, or no icterus ENT:  TMs pearly gray b/l, OP without erythema, TTP over R max sinus, frontal sinuses b/l.  LYMPH: no cervical LAD CV: NRRR, normal S1/S2, no murmur, distal pulses 2+ b/l Resp: CTABL, no wheezes, normal WOB Ext: No edema, warm Neuro: Alert and oriented  Assessment & Plan:  Maurion was seen today for sinus pressure, headache and cough.  Diagnoses and all orders for this visit:  Acute URI Started getting sick 4 days ago Got completely better from last URI seen 1 mo ago for Discussed symptomatic care, sinus rinses, zyrtec, flonase Start below if worsening symptoms  Acute sinusitis, recurrence not specified, unspecified location -     amoxicillin-clavulanate (AUGMENTIN) 875-125 MG tablet; Take 1 tablet by mouth 2 (two) times daily.   Follow up plan: prn Tyler Kras, MD [place] [facility]  Family Medicine

## 2016-08-04 ENCOUNTER — Encounter: Payer: Self-pay | Admitting: Nurse Practitioner

## 2016-08-04 ENCOUNTER — Telehealth: Payer: Self-pay | Admitting: *Deleted

## 2016-08-04 ENCOUNTER — Ambulatory Visit (INDEPENDENT_AMBULATORY_CARE_PROVIDER_SITE_OTHER): Payer: BLUE CROSS/BLUE SHIELD | Admitting: Nurse Practitioner

## 2016-08-04 VITALS — BP 129/75 | HR 72 | Temp 97.0°F | Ht 69.0 in | Wt 230.0 lb

## 2016-08-04 DIAGNOSIS — E782 Mixed hyperlipidemia: Secondary | ICD-10-CM | POA: Diagnosis not present

## 2016-08-04 DIAGNOSIS — I10 Essential (primary) hypertension: Secondary | ICD-10-CM

## 2016-08-04 DIAGNOSIS — E1165 Type 2 diabetes mellitus with hyperglycemia: Secondary | ICD-10-CM

## 2016-08-04 DIAGNOSIS — I25111 Atherosclerotic heart disease of native coronary artery with angina pectoris with documented spasm: Secondary | ICD-10-CM | POA: Diagnosis not present

## 2016-08-04 DIAGNOSIS — Z23 Encounter for immunization: Secondary | ICD-10-CM

## 2016-08-04 LAB — BAYER DCA HB A1C WAIVED: HB A1C: 10.2 % — AB (ref <7.0)

## 2016-08-04 MED ORDER — INSULIN GLARGINE 100 UNIT/ML SOLOSTAR PEN: 26.0000 [IU] | PEN_INJECTOR | Freq: Every day | SUBCUTANEOUS | 11 refills | Status: DC

## 2016-08-04 MED ORDER — FENOFIBRIC ACID 135 MG PO CPDR: 1.0000 | DELAYED_RELEASE_CAPSULE | Freq: Every day | ORAL | 1 refills | Status: DC

## 2016-08-04 MED ORDER — GLUCOSE BLOOD VI STRP: ORAL_STRIP | 12 refills | Status: DC

## 2016-08-04 MED ORDER — ROSUVASTATIN CALCIUM 40 MG PO TABS: 20.0000 mg | ORAL_TABLET | Freq: Every day | ORAL | 5 refills | Status: DC

## 2016-08-04 MED ORDER — METOPROLOL TARTRATE 25 MG PO TABS: 25.0000 mg | ORAL_TABLET | Freq: Two times a day (BID) | ORAL | 1 refills | Status: DC

## 2016-08-04 MED ORDER — GLIMEPIRIDE 4 MG PO TABS: 4.0000 mg | ORAL_TABLET | Freq: Two times a day (BID) | ORAL | 1 refills | Status: DC

## 2016-08-04 MED ORDER — SITAGLIPTIN PHOS-METFORMIN HCL 50-1000 MG PO TABS: 1.0000 | ORAL_TABLET | Freq: Two times a day (BID) | ORAL | 1 refills | Status: DC

## 2016-08-04 MED ORDER — LISINOPRIL 10 MG PO TABS: 10.0000 mg | ORAL_TABLET | Freq: Every day | ORAL | 1 refills | Status: DC

## 2016-08-04 NOTE — Progress Notes (Signed)
Subjective:    Patient ID: Tyler Garner, male    DOB: 08-Sep-1955, 61 y.o.   MRN: [id number]   Patient here today for follow up of chronic medical problems.  Outpatient Encounter Prescriptions as of 04/07/2016  Medication Sig  . aspirin 81 MG tablet Take 81 mg by mouth daily.    . Choline Fenofibrate (FENOFIBRIC ACID) 135 MG CPDR Take 1 capsule by mouth daily.  Marland Kitchen glimepiride (AMARYL) 4 MG tablet Take 1 tablet (4 mg total) by mouth 2 (two) times daily.  Marland Kitchen glimepiride (AMARYL) 4 MG tablet TAKE 1 TABLET TWICE A DAY  . lisinopril (PRINIVIL,ZESTRIL) 10 MG tablet Take 1 tablet (10 mg total) by mouth daily.  . metoprolol tartrate (LOPRESSOR) 25 MG tablet Take 1 tablet (25 mg total) by mouth 2 (two) times daily.  . rosuvastatin (CRESTOR) 40 MG tablet Take 0.5 tablets (20 mg total) by mouth daily.  . sitaGLIPtin-metformin (JANUMET) 50-1000 MG tablet Take 1 tablet by mouth 2 (two) times daily with a meal.    Hypertension  This is a chronic problem. The problem has been waxing and waning since onset. The problem is uncontrolled. Pertinent negatives include no headaches, palpitations or shortness of breath. Risk factors for coronary artery disease include obesity, male gender, dyslipidemia and diabetes mellitus. Past treatments include ACE inhibitors and beta blockers. Hypertensive end-organ damage includes CVA (stent placement heart).  Hyperlipidemia  This is a chronic problem. The current episode started more than 1 year ago. The problem is uncontrolled. Recent lipid tests were reviewed and are high. Exacerbating diseases include diabetes and obesity. He has no history of hypothyroidism. Pertinent negatives include no myalgias or shortness of breath. Current antihyperlipidemic treatment includes statins. The current treatment provides mild improvement of lipids. Compliance problems include adherence to diet and adherence to exercise.  Risk factors for coronary artery disease include dyslipidemia, family  history, hypertension, male sex and obesity.  Diabetes  He presents for his follow-up diabetic visit. He has type 2 diabetes mellitus. No MedicAlert identification noted. His disease course has been fluctuating (last HGBA1c was 11). Pertinent negatives for hypoglycemia include no headaches. Pertinent negatives for diabetes include no fatigue, no polydipsia, no polyphagia, no polyuria and no visual change. Diabetic complications include a CVA (stent placement heart). Current diabetic treatment includes oral agent (triple therapy). He is compliant with treatment most of the time. His weight is stable. He is following a high fat/cholesterol diet. When asked about meal planning, he reported none. He has not had a previous visit with a dietitian. He rarely participates in exercise. His home blood glucose trend is increasing rapidly (patient has been checking blood sugars 2x a day). His breakfast blood glucose is taken between 8-9 am. His breakfast blood glucose range is generally 130-140 mg/dl. His highest blood glucose is >200 mg/dl. His overall blood glucose range is 140-180 mg/dl. An ACE inhibitor/angiotensin II receptor blocker is being taken. He does not see a podiatrist.Eye exam is not current.  Hx of stent placement/CAD/Hx stemi Is doing well since stent placement. No c/o shortness of breath.    Review of Systems  Constitutional: Negative.  Negative for fatigue.  HENT: Negative.   Eyes: Negative.   Respiratory: Negative.  Negative for shortness of breath.   Cardiovascular: Negative.  Negative for palpitations.  Gastrointestinal: Negative.   Endocrine: Negative.  Negative for polydipsia, polyphagia and polyuria.  Genitourinary: Negative.  Negative for hematuria, penile swelling and urgency.  Musculoskeletal: Negative.  Negative for myalgias.  Skin: Negative.  Allergic/Immunologic: Negative.   Neurological: Negative.  Negative for headaches.  Hematological: Negative.   Psychiatric/Behavioral:  Negative.   All other systems reviewed and are negative.      Objective:   Physical Exam  Constitutional: He is oriented to person, place, and time. He appears well-developed and well-nourished.  HENT:  Head: Normocephalic.  Right Ear: External ear normal.  Left Ear: External ear normal.  Nose: Nose normal.  Mouth/Throat: Oropharynx is clear and moist.  Eyes: EOM are normal. Pupils are equal, round, and reactive to light.  Neck: Normal range of motion. Neck supple. No JVD present. No thyromegaly present.  Cardiovascular: Normal rate, regular rhythm, normal heart sounds and intact distal pulses.  Exam reveals no gallop and no friction rub.   No murmur heard. Pulmonary/Chest: Effort normal and breath sounds normal. No respiratory distress. He has no wheezes. He has no rales. He exhibits no tenderness.  Abdominal: Soft. Bowel sounds are normal. He exhibits no mass. There is no tenderness.  Genitourinary:  Genitourinary Comments: Refuses rectal exam  Musculoskeletal: Normal range of motion. He exhibits no edema.  Lymphadenopathy:    He has no cervical adenopathy.  Neurological: He is alert and oriented to person, place, and time. No cranial nerve deficit.  Skin: Skin is warm and dry.  Psychiatric: He has a normal mood and affect. His behavior is normal. Judgment and thought content normal.   BP 129/75   Pulse 72   Temp 97 F (36.1 C) (Oral)   Ht '5\' 9"'$  (1.753 m)   Wt 230 lb (104.3 kg)   BMI 33.97 kg/m    hgba1c- 10.2% down from 11.6% at last visit    Assessment & Plan:  1. Type 2 diabetes mellitus with hyperglycemia, without long-term current use of insulin (HCC) Lantus increased to 23 units/day for 3 days then 26 units/day. Patient to check blood glucose at home and keep a log of his readings.  - Bayer DCA Hb A1c Waived - Microalbumin / creatinine urine ratio - sitaGLIPtin-metformin (JANUMET) 50-1000 MG tablet; Take 1 tablet by mouth 2 (two) times daily with a meal.   Dispense: 180 tablet; Refill: 1 - glimepiride (AMARYL) 4 MG tablet; Take 1 tablet (4 mg total) by mouth 2 (two) times daily.  Dispense: 180 tablet; Refill: 1 - Insulin Glargine (LANTUS SOLOSTAR) 100 UNIT/ML Solostar Pen; Inject 26 Units into the skin daily at 10 pm.  Dispense: 15 mL; Refill: 11 - glucose blood (ACCU-CHEK ACTIVE STRIPS) test strip; Check blood sugar daily and prn  Dx E11.9  Dispense: 100 each; Refill: 12  2. Essential hypertension DASH diet - CMP14+EGFR - lisinopril (PRINIVIL,ZESTRIL) 10 MG tablet; Take 1 tablet (10 mg total) by mouth daily.  Dispense: 90 tablet; Refill: 1  3. Mixed hyperlipidemia Low-fat diet - Lipid panel - rosuvastatin (CRESTOR) 40 MG tablet; Take 0.5 tablets (20 mg total) by mouth daily.  Dispense: 45 tablet; Refill: 5 - Choline Fenofibrate (FENOFIBRIC ACID) 135 MG CPDR; Take 1 capsule by mouth daily.  Dispense: 90 capsule; Refill: 1  4. Atherosclerosis of native coronary artery of native heart with angina pectoris with documented spasm (HCC) Low fat diet, aerobic exercise 30 min/day - metoprolol tartrate (LOPRESSOR) 25 MG tablet; Take 1 tablet (25 mg total) by mouth 2 (two) times daily.  Dispense: 180 tablet; Refill: 1  5. Morbid obesity ([place]) Decrease calories in diet. Begin aerobic exercise 30 min day.   Health maintenance reviewed.  Diet and exercise encouraged. Patient to follow up in  one month to assess new dosage of lantus.  Continue taking all other meds   [name] Hassell Done, [name]

## 2016-08-04 NOTE — Patient Instructions (Signed)
Carbohydrate Counting for Diabetes Mellitus, Adult Carbohydrate counting is a method for keeping track of how many carbohydrates you eat. Eating carbohydrates naturally increases the amount of sugar (glucose) in the blood. Counting how many carbohydrates you eat helps keep your blood glucose within normal limits, which helps you manage your diabetes (diabetes mellitus). It is important to know how many carbohydrates you can safely have in each meal. This is different for every person. A diet and nutrition specialist (registered dietitian) can help you make a meal plan and calculate how many carbohydrates you should have at each meal and snack. Carbohydrates are found in the following foods:  Grains, such as breads and cereals.  Dried beans and soy products.  Starchy vegetables, such as potatoes, peas, and corn.  Fruit and fruit juices.  Milk and yogurt.  Sweets and snack foods, such as cake, cookies, candy, chips, and soft drinks. How do I count carbohydrates? There are two ways to count carbohydrates in food. You can use either of the methods or a combination of both. Reading "Nutrition Facts" on packaged food  The "Nutrition Facts" list is included on the labels of almost all packaged foods and beverages in the [place] It includes:  The serving size.  Information about nutrients in each serving, including the grams (g) of carbohydrate per serving. To use the "Nutrition Facts":  Decide how many servings you will have.  Multiply the number of servings by the number of carbohydrates per serving.  The resulting number is the total amount of carbohydrates that you will be having. Learning standard serving sizes of other foods  When you eat foods containing carbohydrates that are not packaged or do not include "Nutrition Facts" on the label, you need to measure the servings in order to count the amount of carbohydrates:  Measure the foods that you will eat with a food scale or measuring  cup, if needed.  Decide how many standard-size servings you will eat.  Multiply the number of servings by 15. Most carbohydrate-rich foods have about 15 g of carbohydrates per serving.  For example, if you eat 8 oz (170 g) of strawberries, you will have eaten 2 servings and 30 g of carbohydrates (2 servings x 15 g = 30 g).  For foods that have more than one food mixed, such as soups and casseroles, you must count the carbohydrates in each food that is included. The following list contains standard serving sizes of common carbohydrate-rich foods. Each of these servings has about 15 g of carbohydrates:   hamburger bun or  English muffin.   oz (15 mL) syrup.   oz (14 g) jelly.  1 slice of bread.  1 six-inch tortilla.  3 oz (85 g) cooked rice or pasta.  4 oz (113 g) cooked dried beans.  4 oz (113 g) starchy vegetable, such as peas, corn, or potatoes.  4 oz (113 g) hot cereal.  4 oz (113 g) mashed potatoes or  of a large baked potato.  4 oz (113 g) canned or frozen fruit.  4 oz (120 mL) fruit juice.  4-6 crackers.  6 chicken nuggets.  6 oz (170 g) unsweetened dry cereal.  6 oz (170 g) plain fat-free yogurt or yogurt sweetened with artificial sweeteners.  8 oz (240 mL) milk.  8 oz (170 g) fresh fruit or one small piece of fruit.  24 oz (680 g) popped popcorn. Example of carbohydrate counting Sample meal  3 oz (85 g) chicken breast.  6 oz (  170 g) brown rice.  4 oz (113 g) corn.  8 oz (240 mL) milk.  8 oz (170 g) strawberries with sugar-free whipped topping. Carbohydrate calculation 1. Identify the foods that contain carbohydrates:  Rice.  Corn.  Milk.  Strawberries. 2. Calculate how many servings you have of each food:  2 servings rice.  1 serving corn.  1 serving milk.  1 serving strawberries. 3. Multiply each number of servings by 15 g:  2 servings rice x 15 g = 30 g.  1 serving corn x 15 g = 15 g.  1 serving milk x 15 g = 15  g.  1 serving strawberries x 15 g = 15 g. 4. Add together all of the amounts to find the total grams of carbohydrates eaten:  30 g + 15 g + 15 g + 15 g = 75 g of carbohydrates total. This information is not intended to replace advice given to you by your health care provider. Make sure you discuss any questions you have with your health care provider. Document Released: 05/11/2005 Document Revised: 11/29/2015 Document Reviewed: 10/23/2015 Elsevier Interactive Patient Education  2017 Elsevier Inc.  

## 2016-08-04 NOTE — Addendum Note (Signed)
Addended by: Cleda Daub on: 08/04/2016 03:45 PM   Modules accepted: Orders

## 2016-08-04 NOTE — Telephone Encounter (Signed)
-----   Message from [name] Martin, [name] sent at 08/04/2016  9:18 AM EDT ----- Remind patient to please do hemoccult cards given at appointment  

## 2016-08-05 LAB — CMP14+EGFR
ALK PHOS: 49 IU/L (ref 39–117)
ALT: 52 IU/L — AB (ref 0–44)
AST: 60 IU/L — ABNORMAL HIGH (ref 0–40)
Albumin/Globulin Ratio: 1.8 (ref 1.2–2.2)
Albumin: 4.8 g/dL (ref 3.6–4.8)
BILIRUBIN TOTAL: 0.3 mg/dL (ref 0.0–1.2)
BUN/Creatinine Ratio: 19 (ref 10–24)
BUN: 18 mg/dL (ref 8–27)
CO2: 26 mmol/L (ref 18–29)
Calcium: 10 mg/dL (ref 8.6–10.2)
Chloride: 97 mmol/L (ref 96–106)
Creatinine, Ser: 0.93 mg/dL (ref 0.76–1.27)
GFR calc Af Amer: 102 mL/min/{1.73_m2} (ref >59)
GFR calc non Af Amer: 88 mL/min/{1.73_m2} (ref >59)
Globulin, Total: 2.7 g/dL (ref 1.5–4.5)
Glucose: 174 mg/dL — ABNORMAL HIGH (ref 65–99)
Potassium: 5.4 mmol/L — ABNORMAL HIGH (ref 3.5–5.2)
Sodium: 141 mmol/L (ref 134–144)
Total Protein: 7.5 g/dL (ref 6.0–8.5)

## 2016-08-05 LAB — LIPID PANEL
CHOLESTEROL TOTAL: 193 mg/dL (ref 100–199)
Chol/HDL Ratio: 10.2 ratio units — ABNORMAL HIGH (ref 0.0–5.0)
HDL: 19 mg/dL — AB (ref >39)
Triglycerides: 639 mg/dL (ref 0–149)

## 2016-09-08 ENCOUNTER — Ambulatory Visit (INDEPENDENT_AMBULATORY_CARE_PROVIDER_SITE_OTHER): Payer: BLUE CROSS/BLUE SHIELD | Admitting: Nurse Practitioner

## 2016-09-08 ENCOUNTER — Encounter: Payer: Self-pay | Admitting: Nurse Practitioner

## 2016-09-08 VITALS — BP 126/80 | HR 67 | Temp 96.5°F | Ht 69.0 in | Wt 230.0 lb

## 2016-09-08 DIAGNOSIS — E1165 Type 2 diabetes mellitus with hyperglycemia: Secondary | ICD-10-CM | POA: Diagnosis not present

## 2016-09-08 DIAGNOSIS — M25511 Pain in right shoulder: Secondary | ICD-10-CM

## 2016-09-08 DIAGNOSIS — G8929 Other chronic pain: Secondary | ICD-10-CM

## 2016-09-08 DIAGNOSIS — M25512 Pain in left shoulder: Secondary | ICD-10-CM

## 2016-09-08 LAB — BAYER DCA HB A1C WAIVED: HB A1C (BAYER DCA - WAIVED): 9.2 % — ABNORMAL HIGH (ref <7.0)

## 2016-09-08 MED ORDER — DICLOFENAC SODIUM 75 MG PO TBEC: 75.0000 mg | DELAYED_RELEASE_TABLET | Freq: Two times a day (BID) | ORAL | 2 refills | Status: DC

## 2016-09-08 MED ORDER — INSULIN GLARGINE 100 UNIT/ML SOLOSTAR PEN: 30.0000 [IU] | PEN_INJECTOR | Freq: Every day | SUBCUTANEOUS | 11 refills | Status: DC

## 2016-09-08 NOTE — Patient Instructions (Signed)
Shoulder Pain Many things can cause shoulder pain, including:  An injury.  Moving the arm in the same way again and again (overuse).  Joint pain (arthritis). Follow these instructions at home: Take these actions to help with your pain:  Squeeze a soft ball or a foam pad as much as you can. This helps to prevent swelling. It also makes the arm stronger.  Take over-the-counter and prescription medicines only as told by your doctor.  If told, put ice on the area:  Put ice in a plastic bag.  Place a towel between your skin and the bag.  Leave the ice on for 20 minutes, 2-3 times per day. Stop putting on ice if it does not help with the pain.  If you were given a shoulder sling or immobilizer:  Wear it as told.  Remove it to shower or bathe.  Move your arm as little as possible.  Keep your hand moving. This helps prevent swelling. Contact a doctor if:  Your pain gets worse.  Medicine does not help your pain.  You have new pain in your arm, hand, or fingers. Get help right away if:  Your arm, hand, or fingers:  Tingle.  Are numb.  Are swollen.  Are painful.  Turn white or blue. This information is not intended to replace advice given to you by your health care provider. Make sure you discuss any questions you have with your health care provider. Document Released: 10/28/2007 Document Revised: 01/05/2016 Document Reviewed: 09/03/2014 Elsevier Interactive Patient Education  2017 Elsevier Inc.  

## 2016-09-08 NOTE — Progress Notes (Signed)
   Subjective:    Patient ID: Tyler Garner, male    DOB: 06-10-1955, 61 y.o.   MRN: [id number]  HPI Pastient seen on 08/04/16 for chronic follow up. His HGBA1c was 10.2%- it was down slightly from previous visit but not much. We increased his lantus to 26u daily over a weeks time. He was told to watch carbs in his diet and to exercise.  He is here today to see if changes have helped. He still does not check blood sugars everyday. Fasting blood sugars are averaging  * C/O bill shoulder pain- has been going on for several years -does not want to see ortho just yet- use to take NSAID but has ran out- seemed to help  Review of Systems  Constitutional: Negative for diaphoresis.  Eyes: Negative for pain.  Respiratory: Negative for shortness of breath.   Cardiovascular: Negative for chest pain, palpitations and leg swelling.  Gastrointestinal: Negative for abdominal pain.  Endocrine: Negative for polydipsia.  Skin: Negative for rash.  Neurological: Negative for dizziness, weakness and headaches.  Hematological: Does not bruise/bleed easily.       Objective:   Physical Exam  Constitutional: He is oriented to person, place, and time. He appears well-developed and well-nourished.  HENT:  Head: Normocephalic.  Right Ear: External ear normal.  Left Ear: External ear normal.  Nose: Nose normal.  Mouth/Throat: Oropharynx is clear and moist.  Eyes: EOM are normal. Pupils are equal, round, and reactive to light.  Neck: Normal range of motion. Neck supple. No JVD present. No thyromegaly present.  Cardiovascular: Normal rate, regular rhythm, normal heart sounds and intact distal pulses.  Exam reveals no gallop and no friction rub.   No murmur heard. Pulmonary/Chest: Effort normal and breath sounds normal. No respiratory distress. He has no wheezes. He has no rales. He exhibits no tenderness.  Abdominal: Soft. Bowel sounds are normal. He exhibits no mass. There is no tenderness.  Genitourinary:  Prostate normal and penis normal.  Musculoskeletal: Normal range of motion. He exhibits no edema.  FROM bil shoulder with pain on full abduction and internal rotation  Lymphadenopathy:    He has no cervical adenopathy.  Neurological: He is alert and oriented to person, place, and time. No cranial nerve deficit.  Skin: Skin is warm and dry.  Psychiatric: He has a normal mood and affect. His behavior is normal. Judgment and thought content normal.    BP 126/80   Pulse 67   Temp (!) 96.5 F (35.8 C) (Oral)   Ht 5\' 9"  (1.753 m)   Wt 230 lb (104.3 kg)   BMI 33.97 kg/m   HGBA1c 9.2%     Assessment & Plan:  1. Type 2 diabetes mellitus with hyperglycemia, without long-term current use of insulin (HCC) Stricter carb counting Increase lantus to 30u daily - Bayer DCA Hb A1c Waived - Insulin Glargine (LANTUS SOLOSTAR) 100 UNIT/ML Solostar Pen; Inject 30 Units into the skin daily at 10 pm.  Dispense: 15 mL; Refill: 11  2. Chronic pain of both shoulders Ice bid Limit movement if painful Patient will let me know when needs to see ortho - diclofenac (VOLTAREN) 75 MG EC tablet; Take 1 tablet (75 mg total) by mouth 2 (two) times daily.  Dispense: 60 tablet; Refill: 2  [name] Daphine Deutscher, [name]

## 2016-11-16 ENCOUNTER — Ambulatory Visit (INDEPENDENT_AMBULATORY_CARE_PROVIDER_SITE_OTHER): Payer: BLUE CROSS/BLUE SHIELD | Admitting: Nurse Practitioner

## 2016-11-16 ENCOUNTER — Encounter: Payer: Self-pay | Admitting: Nurse Practitioner

## 2016-11-16 VITALS — BP 137/85 | HR 74 | Temp 97.2°F | Ht 69.0 in | Wt 234.0 lb

## 2016-11-16 DIAGNOSIS — I2119 ST elevation (STEMI) myocardial infarction involving other coronary artery of inferior wall: Secondary | ICD-10-CM | POA: Diagnosis not present

## 2016-11-16 DIAGNOSIS — I25111 Atherosclerotic heart disease of native coronary artery with angina pectoris with documented spasm: Secondary | ICD-10-CM

## 2016-11-16 DIAGNOSIS — E782 Mixed hyperlipidemia: Secondary | ICD-10-CM

## 2016-11-16 DIAGNOSIS — E1165 Type 2 diabetes mellitus with hyperglycemia: Secondary | ICD-10-CM

## 2016-11-16 DIAGNOSIS — E875 Hyperkalemia: Secondary | ICD-10-CM

## 2016-11-16 DIAGNOSIS — I219 Acute myocardial infarction, unspecified: Secondary | ICD-10-CM

## 2016-11-16 DIAGNOSIS — I10 Essential (primary) hypertension: Secondary | ICD-10-CM

## 2016-11-16 LAB — BAYER DCA HB A1C WAIVED: HB A1C (BAYER DCA - WAIVED): 11.3 % — ABNORMAL HIGH (ref <7.0)

## 2016-11-16 MED ORDER — PEN NEEDLES 31G X 8 MM MISC: 2 refills | Status: DC

## 2016-11-16 MED ORDER — INSULIN GLARGINE 100 UNIT/ML SOLOSTAR PEN: 40.0000 [IU] | PEN_INJECTOR | Freq: Every day | SUBCUTANEOUS | 11 refills | Status: DC

## 2016-11-16 NOTE — Progress Notes (Signed)
Subjective:    Patient ID: Tyler Garner, male    DOB: Aug 02, 1955, 61 y.o.   MRN: [id number]  HPI RIAN BUSCHE is here today for follow up of chronic medical problem.  Outpatient Encounter Prescriptions as of 11/16/2016  Medication Sig  . aspirin 81 MG tablet Take 81 mg by mouth daily.    . Choline Fenofibrate (FENOFIBRIC ACID) 135 MG CPDR Take 1 capsule by mouth daily.  . diclofenac (VOLTAREN) 75 MG EC tablet Take 1 tablet (75 mg total) by mouth 2 (two) times daily.  Marland Kitchen glimepiride (AMARYL) 4 MG tablet Take 1 tablet (4 mg total) by mouth 2 (two) times daily.  Marland Kitchen glucose blood (ACCU-CHEK ACTIVE STRIPS) test strip Check blood sugar daily and prn  Dx E11.9  . glucose blood test strip Use to test BG twice daily  . Insulin Glargine (LANTUS SOLOSTAR) 100 UNIT/ML Solostar Pen Inject 30 Units into the skin daily at 10 pm.  . Insulin Pen Needle (PEN NEEDLES) 31G X 8 MM MISC Use with lantus solosar pen daily  . lisinopril (PRINIVIL,ZESTRIL) 10 MG tablet Take 1 tablet (10 mg total) by mouth daily.  . metoprolol tartrate (LOPRESSOR) 25 MG tablet Take 1 tablet (25 mg total) by mouth 2 (two) times daily.  . rosuvastatin (CRESTOR) 40 MG tablet Take 0.5 tablets (20 mg total) by mouth daily.  . sitaGLIPtin-metformin (JANUMET) 50-1000 MG tablet Take 1 tablet by mouth 2 (two) times daily with a meal.   No facility-administered encounter medications on file as of 11/16/2016.     1. Type 2 diabetes mellitus with hyperglycemia, without long-term current use of insulin ([place]) last hgba1c was 9.2%. We increased lantus to 30u at last visit. Patient has not been watching diet nor has he been checking blood sugars.Does not watch diet,  2. Essential hypertension   no c/o chest pain,sob or ha- does not check blod pressure at home  3. Mixed hyperlipidemia  Currently not watching diet  4. ST elevation myocardial infarction (STEMI) of inferolateral wall (HCC)  No chest pain-   5. Atherosclerosis of native  coronary artery of native heart with angina pectoris with documented spasm [facility])  Has not seen cardiologist as of late  31. Acute right ventricular myocardial infarction [facility])  Again denies chest pain  7. Morbid obesity ([place])  Weight up 4 lbs since last visit    New complaints: None today    Review of Systems  Constitutional: Negative for activity change and appetite change.  HENT: Negative.   Eyes: Negative for pain.  Respiratory: Negative for shortness of breath.   Cardiovascular: Negative for chest pain, palpitations and leg swelling.  Gastrointestinal: Negative for abdominal pain.  Endocrine: Negative for polydipsia.  Genitourinary: Negative.   Skin: Negative for rash.  Neurological: Negative for dizziness, weakness and headaches.  Hematological: Does not bruise/bleed easily.  Psychiatric/Behavioral: Negative.   All other systems reviewed and are negative.      Objective:   Physical Exam  Constitutional: He is oriented to person, place, and time. He appears well-developed and well-nourished.  HENT:  Head: Normocephalic.  Right Ear: External ear normal.  Left Ear: External ear normal.  Nose: Nose normal.  Mouth/Throat: Oropharynx is clear and moist.  Eyes: EOM are normal. Pupils are equal, round, and reactive to light.  Neck: Normal range of motion. Neck supple. No JVD present. No thyromegaly present.  Cardiovascular: Normal rate, regular rhythm, normal heart sounds and intact distal pulses.  Exam reveals no gallop  and no friction rub.   No murmur heard. Pulmonary/Chest: Effort normal and breath sounds normal. No respiratory distress. He has no wheezes. He has no rales. He exhibits no tenderness.  Abdominal: Soft. Bowel sounds are normal. He exhibits no mass. There is no tenderness.  Musculoskeletal: Normal range of motion. He exhibits no edema.  Lymphadenopathy:    He has no cervical adenopathy.  Neurological: He is alert and oriented to person, place, and time. No  cranial nerve deficit.  Skin: Skin is warm and dry.  Psychiatric: He has a normal mood and affect. His behavior is normal. Judgment and thought content normal.   BP 137/85   Pulse 74   Temp 97.2 F (36.2 C) (Oral)   Ht 5' 9" (1.753 m)   Wt 234 lb (106.1 kg)   BMI 34.56 kg/m   HGBAc1 11.3%      Assessment & Plan:  1. Type 2 diabetes mellitus with hyperglycemia, without long-term current use of insulin (HCC) Stricter carb counting Increase lantus too35u for 3 days then to 40 u Keep diary of fasting blood sugars Recheck in 1 month - Bayer DCA Hb A1c Waived - Insulin Glargine (LANTUS SOLOSTAR) 100 UNIT/ML Solostar Pen; Inject 40 Units into the skin daily at 10 pm.  Dispense: 15 mL; Refill: 11 - Insulin Pen Needle (PEN NEEDLES) 31G X 8 MM MISC; Use with lantus solosar pen daily  Dispense: 100 each; Refill: 2  2. Essential hypertension Low sodium diet - CMP14+EGFR  3. Mixed hyperlipidemia Low fat diet - Lipid panel  4. ST elevation myocardial infarction (STEMI) of inferolateral wall (HCC)  5. Atherosclerosis of native coronary artery of native heart with angina pectoris with documented spasm ([place]) Need to follow up with cardiology  6. Acute right ventricular myocardial infarction ([place])  7. Morbid obesity ([place]) Discussed diet and exercise for person with BMI >25 Will recheck weight in 3-6 months   Encouraged to do eye exam refeuse colonoscopy- reminded to do hemoccult cards given to him previously Labs pending Health maintenance reviewed Diet and exercise encouraged Continue all meds Follow up  In 1 recheck diabetes   [place], [name]

## 2016-11-16 NOTE — Patient Instructions (Signed)

## 2016-11-17 ENCOUNTER — Telehealth: Payer: Self-pay

## 2016-11-17 LAB — LIPID PANEL
CHOLESTEROL TOTAL: 213 mg/dL — AB (ref 100–199)
Chol/HDL Ratio: 13.3 ratio — ABNORMAL HIGH (ref 0.0–5.0)
HDL: 16 mg/dL — AB (ref >39)
Triglycerides: 1053 mg/dL (ref 0–149)

## 2016-11-17 LAB — CMP14+EGFR
A/G RATIO: 1.8 (ref 1.2–2.2)
ALK PHOS: 52 IU/L (ref 39–117)
ALT: 34 IU/L (ref 0–44)
AST: 48 IU/L — ABNORMAL HIGH (ref 0–40)
Albumin: 4.6 g/dL (ref 3.6–4.8)
BILIRUBIN TOTAL: 0.2 mg/dL (ref 0.0–1.2)
BUN / CREAT RATIO: 15 (ref 10–24)
BUN: 16 mg/dL (ref 8–27)
CHLORIDE: 99 mmol/L (ref 96–106)
CO2: 25 mmol/L (ref 20–29)
Calcium: 10.3 mg/dL — ABNORMAL HIGH (ref 8.6–10.2)
Creatinine, Ser: 1.04 mg/dL (ref 0.76–1.27)
GFR calc non Af Amer: 77 mL/min/{1.73_m2} (ref >59)
GFR, EST AFRICAN AMERICAN: 89 mL/min/{1.73_m2} (ref >59)
GLUCOSE: 300 mg/dL — AB (ref 65–99)
Globulin, Total: 2.6 g/dL (ref 1.5–4.5)
POTASSIUM: 5.9 mmol/L — AB (ref 3.5–5.2)
Sodium: 139 mmol/L (ref 134–144)
TOTAL PROTEIN: 7.2 g/dL (ref 6.0–8.5)

## 2016-11-17 NOTE — Addendum Note (Signed)
Addended by: Tamera Punt on: 11/17/2016 03:32 PM   Modules accepted: Orders

## 2016-11-17 NOTE — Telephone Encounter (Signed)
Left message to call back about lab results. Per MMM needs to have potassium rechecked asap

## 2016-11-18 ENCOUNTER — Other Ambulatory Visit: Payer: BLUE CROSS/BLUE SHIELD

## 2016-11-18 DIAGNOSIS — E875 Hyperkalemia: Secondary | ICD-10-CM

## 2016-11-19 ENCOUNTER — Other Ambulatory Visit: Payer: Self-pay | Admitting: *Deleted

## 2016-11-19 DIAGNOSIS — E875 Hyperkalemia: Secondary | ICD-10-CM

## 2016-11-19 LAB — BMP8+EGFR
BUN / CREAT RATIO: 15 (ref 10–24)
BUN: 15 mg/dL (ref 8–27)
CO2: 22 mmol/L (ref 20–29)
CREATININE: 0.97 mg/dL (ref 0.76–1.27)
Calcium: 9.4 mg/dL (ref 8.6–10.2)
Chloride: 97 mmol/L (ref 96–106)
GFR, EST AFRICAN AMERICAN: 96 mL/min/{1.73_m2} (ref >59)
GFR, EST NON AFRICAN AMERICAN: 83 mL/min/{1.73_m2} (ref >59)
Glucose: 272 mg/dL — ABNORMAL HIGH (ref 65–99)
Potassium: 5.5 mmol/L — ABNORMAL HIGH (ref 3.5–5.2)
Sodium: 136 mmol/L (ref 134–144)

## 2016-11-23 ENCOUNTER — Other Ambulatory Visit: Payer: BLUE CROSS/BLUE SHIELD

## 2016-11-23 DIAGNOSIS — E875 Hyperkalemia: Secondary | ICD-10-CM

## 2016-11-24 ENCOUNTER — Telehealth: Payer: Self-pay | Admitting: Nurse Practitioner

## 2016-11-24 LAB — BMP8+EGFR
BUN/Creatinine Ratio: 13 (ref 10–24)
BUN: 15 mg/dL (ref 8–27)
CO2: 23 mmol/L (ref 20–29)
CREATININE: 1.12 mg/dL (ref 0.76–1.27)
Calcium: 11 mg/dL — ABNORMAL HIGH (ref 8.6–10.2)
Chloride: 93 mmol/L — ABNORMAL LOW (ref 96–106)
GFR, EST AFRICAN AMERICAN: 81 mL/min/{1.73_m2} (ref >59)
GFR, EST NON AFRICAN AMERICAN: 70 mL/min/{1.73_m2} (ref >59)
Glucose: 263 mg/dL — ABNORMAL HIGH (ref 65–99)
POTASSIUM: 5.5 mmol/L — AB (ref 3.5–5.2)
Sodium: 139 mmol/L (ref 134–144)

## 2016-11-24 NOTE — Telephone Encounter (Signed)
Pt aware of labs  

## 2016-11-30 ENCOUNTER — Other Ambulatory Visit: Payer: BLUE CROSS/BLUE SHIELD

## 2016-11-30 DIAGNOSIS — E875 Hyperkalemia: Secondary | ICD-10-CM

## 2016-12-01 LAB — BMP8+EGFR
BUN / CREAT RATIO: 16 (ref 10–24)
BUN: 14 mg/dL (ref 8–27)
CO2: 23 mmol/L (ref 20–29)
CREATININE: 0.9 mg/dL (ref 0.76–1.27)
Calcium: 10.4 mg/dL — ABNORMAL HIGH (ref 8.6–10.2)
Chloride: 95 mmol/L — ABNORMAL LOW (ref 96–106)
GFR calc non Af Amer: 91 mL/min/{1.73_m2} (ref >59)
GFR, EST AFRICAN AMERICAN: 105 mL/min/{1.73_m2} (ref >59)
Glucose: 337 mg/dL — ABNORMAL HIGH (ref 65–99)
Potassium: 5.5 mmol/L — ABNORMAL HIGH (ref 3.5–5.2)
Sodium: 136 mmol/L (ref 134–144)

## 2016-12-03 ENCOUNTER — Other Ambulatory Visit: Payer: Self-pay | Admitting: Nurse Practitioner

## 2016-12-03 MED ORDER — SODIUM POLYSTYRENE SULFONATE PO POWD: Freq: Once | ORAL | 0 refills | Status: DC

## 2016-12-07 ENCOUNTER — Other Ambulatory Visit: Payer: BLUE CROSS/BLUE SHIELD

## 2016-12-07 DIAGNOSIS — E875 Hyperkalemia: Secondary | ICD-10-CM

## 2016-12-08 LAB — CMP14+EGFR
ALT: 43 IU/L (ref 0–44)
AST: 58 IU/L — ABNORMAL HIGH (ref 0–40)
Albumin/Globulin Ratio: 1.5 (ref 1.2–2.2)
Albumin: 4.2 g/dL (ref 3.6–4.8)
Alkaline Phosphatase: 48 IU/L (ref 39–117)
BUN/Creatinine Ratio: 14 (ref 10–24)
BUN: 13 mg/dL (ref 8–27)
Bilirubin Total: 0.3 mg/dL (ref 0.0–1.2)
CO2: 24 mmol/L (ref 20–29)
Calcium: 9.7 mg/dL (ref 8.6–10.2)
Chloride: 97 mmol/L (ref 96–106)
Creatinine, Ser: 0.94 mg/dL (ref 0.76–1.27)
GFR calc Af Amer: 100 mL/min/{1.73_m2} (ref >59)
GFR calc non Af Amer: 87 mL/min/{1.73_m2} (ref >59)
Globulin, Total: 2.8 g/dL (ref 1.5–4.5)
Glucose: 305 mg/dL — ABNORMAL HIGH (ref 65–99)
Potassium: 5.3 mmol/L — ABNORMAL HIGH (ref 3.5–5.2)
Sodium: 137 mmol/L (ref 134–144)
Total Protein: 7 g/dL (ref 6.0–8.5)

## 2016-12-14 ENCOUNTER — Other Ambulatory Visit: Payer: BLUE CROSS/BLUE SHIELD

## 2016-12-14 DIAGNOSIS — E875 Hyperkalemia: Secondary | ICD-10-CM

## 2016-12-15 ENCOUNTER — Other Ambulatory Visit: Payer: Self-pay | Admitting: Nurse Practitioner

## 2016-12-15 DIAGNOSIS — E875 Hyperkalemia: Secondary | ICD-10-CM

## 2016-12-15 LAB — BMP8+EGFR
BUN / CREAT RATIO: 17 (ref 10–24)
BUN: 15 mg/dL (ref 8–27)
CHLORIDE: 96 mmol/L (ref 96–106)
CO2: 25 mmol/L (ref 20–29)
Calcium: 10.6 mg/dL — ABNORMAL HIGH (ref 8.6–10.2)
Creatinine, Ser: 0.86 mg/dL (ref 0.76–1.27)
GFR calc non Af Amer: 93 mL/min/{1.73_m2} (ref >59)
GFR, EST AFRICAN AMERICAN: 107 mL/min/{1.73_m2} (ref >59)
GLUCOSE: 291 mg/dL — AB (ref 65–99)
POTASSIUM: 6 mmol/L — AB (ref 3.5–5.2)
Sodium: 135 mmol/L (ref 134–144)

## 2016-12-15 MED ORDER — SODIUM POLYSTYRENE SULFONATE PO POWD: Freq: Once | ORAL | 0 refills | Status: AC

## 2016-12-21 ENCOUNTER — Encounter: Payer: Self-pay | Admitting: Nurse Practitioner

## 2016-12-21 ENCOUNTER — Ambulatory Visit (INDEPENDENT_AMBULATORY_CARE_PROVIDER_SITE_OTHER): Payer: BLUE CROSS/BLUE SHIELD | Admitting: Nurse Practitioner

## 2016-12-21 VITALS — BP 143/79 | HR 72 | Temp 97.5°F | Ht 69.0 in | Wt 236.0 lb

## 2016-12-21 DIAGNOSIS — I10 Essential (primary) hypertension: Secondary | ICD-10-CM

## 2016-12-21 DIAGNOSIS — E875 Hyperkalemia: Secondary | ICD-10-CM | POA: Diagnosis not present

## 2016-12-21 DIAGNOSIS — E1165 Type 2 diabetes mellitus with hyperglycemia: Secondary | ICD-10-CM | POA: Diagnosis not present

## 2016-12-21 LAB — BAYER DCA HB A1C WAIVED: HB A1C: 10.5 % — AB (ref <7.0)

## 2016-12-21 MED ORDER — AMLODIPINE BESYLATE 5 MG PO TABS: 5.0000 mg | ORAL_TABLET | Freq: Every day | ORAL | 1 refills | Status: DC

## 2016-12-21 MED ORDER — INSULIN GLARGINE 100 UNIT/ML SOLOSTAR PEN: 45.0000 [IU] | PEN_INJECTOR | Freq: Every day | SUBCUTANEOUS | 11 refills | Status: DC

## 2016-12-21 NOTE — Progress Notes (Signed)
   Subjective:    Patient ID: Tyler Garner, male    DOB: 1956/03/31, 61 y.o.   MRN: [id number]  HPI [facility] come sin today for follow up. Since his last follow up, we have had a hard time keeping his potassium down. He has had several doses of kayxolate and we have stopped his lisinoprol and it still is running close to 6.0. He denies any chest pain , but does say that blood pressure has been running high. Last hgba1c was running 11.3% and we increased lantus to 40 u daily. Blood sugars have been some better.    Review of Systems  Constitutional: Negative for activity change and appetite change.  HENT: Negative.   Eyes: Negative for pain.  Respiratory: Negative for shortness of breath.   Cardiovascular: Negative for chest pain, palpitations and leg swelling.  Gastrointestinal: Negative for abdominal pain.  Endocrine: Negative for polydipsia.  Genitourinary: Negative.   Skin: Negative for rash.  Neurological: Negative for dizziness, weakness and headaches.  Hematological: Does not bruise/bleed easily.  Psychiatric/Behavioral: Negative.   All other systems reviewed and are negative.      Objective:   Physical Exam  Constitutional: He is oriented to person, place, and time. He appears well-developed and well-nourished.  HENT:  Head: Normocephalic.  Right Ear: External ear normal.  Left Ear: External ear normal.  Nose: Nose normal.  Mouth/Throat: Oropharynx is clear and moist.  Eyes: Pupils are equal, round, and reactive to light. EOM are normal.  Neck: Normal range of motion. Neck supple. No JVD present. No thyromegaly present.  Cardiovascular: Normal rate, regular rhythm, normal heart sounds and intact distal pulses.  Exam reveals no gallop and no friction rub.   No murmur heard. Pulmonary/Chest: Effort normal and breath sounds normal. No respiratory distress. He has no wheezes. He has no rales. He exhibits no tenderness.  Abdominal: Soft. Bowel sounds are normal. He exhibits no  mass. There is no tenderness.  Genitourinary: Prostate normal and penis normal.  Musculoskeletal: Normal range of motion. He exhibits no edema.  Lymphadenopathy:    He has no cervical adenopathy.  Neurological: He is alert and oriented to person, place, and time. No cranial nerve deficit.  Skin: Skin is warm and dry.  Psychiatric: He has a normal mood and affect. His behavior is normal. Judgment and thought content normal.   BP (!) 143/79   Pulse 72   Temp (!) 97.5 F (36.4 C) (Oral)   Ht 5\' 9"  (1.753 m)   Wt 236 lb (107 kg)   BMI 34.85 kg/m '      Assessment & Plan:  1. Type 2 diabetes mellitus with hyperglycemia, without long-term current use of insulin (HCC) Low carb diet Increased lantus from 40nu to 45 u Follow up in 2 onths - Bayer DCA Hb A1c Waived - Insulin Glargine (LANTUS SOLOSTAR) 100 UNIT/ML Solostar Pen; Inject 45 Units into the skin daily at 10 pm.  Dispense: 15 mL; Refill: 11  2. Essential hypertension addded norvasc to meds wathhc for edema - amLODipine (NORVASC) 5 MG tablet; Take 1 tablet (5 mg total) by mouth daily.  Dispense: 90 tablet; Refill: 1   3. Hyperkalemia - labs pending Keep appointment with endocrinology as ordered  [name] Daphine Deutscher, [name]

## 2016-12-21 NOTE — Patient Instructions (Signed)
DASH Eating Plan DASH stands for "Dietary Approaches to Stop Hypertension." The DASH eating plan is a healthy eating plan that has been shown to reduce high blood pressure (hypertension). It may also reduce your risk for type 2 diabetes, heart disease, and stroke. The DASH eating plan may also help with weight loss. What are tips for following this plan? General guidelines  Avoid eating more than 2,300 mg (milligrams) of salt (sodium) a day. If you have hypertension, you may need to reduce your sodium intake to 1,500 mg a day.  Limit alcohol intake to no more than 1 drink a day for nonpregnant women and 2 drinks a day for men. One drink equals 12 oz of beer, 5 oz of wine, or 1 oz of hard liquor.  Work with your health care provider to maintain a healthy body weight or to lose weight. Ask what an ideal weight is for you.  Get at least 30 minutes of exercise that causes your heart to beat faster (aerobic exercise) most days of the week. Activities may include walking, swimming, or biking.  Work with your health care provider or diet and nutrition specialist (dietitian) to adjust your eating plan to your individual calorie needs. Reading food labels  Check food labels for the amount of sodium per serving. Choose foods with less than 5 percent of the Daily Value of sodium. Generally, foods with less than 300 mg of sodium per serving fit into this eating plan.  To find whole grains, look for the word "whole" as the first word in the ingredient list. Shopping  Buy products labeled as "low-sodium" or "no salt added."  Buy fresh foods. Avoid canned foods and premade or frozen meals. Cooking  Avoid adding salt when cooking. Use salt-free seasonings or herbs instead of table salt or sea salt. Check with your health care provider or pharmacist before using salt substitutes.  Do not fry foods. Cook foods using healthy methods such as baking, boiling, grilling, and broiling instead.  Cook with  heart-healthy oils, such as olive, canola, soybean, or sunflower oil. Meal planning   Eat a balanced diet that includes: ? 5 or more servings of fruits and vegetables each day. At each meal, try to fill half of your plate with fruits and vegetables. ? Up to 6-8 servings of whole grains each day. ? Less than 6 oz of lean meat, poultry, or fish each day. A 3-oz serving of meat is about the same size as a deck of cards. One egg equals 1 oz. ? 2 servings of low-fat dairy each day. ? A serving of nuts, seeds, or beans 5 times each week. ? Heart-healthy fats. Healthy fats called Omega-3 fatty acids are found in foods such as flaxseeds and coldwater fish, like sardines, salmon, and mackerel.  Limit how much you eat of the following: ? Canned or prepackaged foods. ? Food that is high in trans fat, such as fried foods. ? Food that is high in saturated fat, such as fatty meat. ? Sweets, desserts, sugary drinks, and other foods with added sugar. ? Full-fat dairy products.  Do not salt foods before eating.  Try to eat at least 2 vegetarian meals each week.  Eat more home-cooked food and less restaurant, buffet, and fast food.  When eating at a restaurant, ask that your food be prepared with less salt or no salt, if possible. What foods are recommended? The items listed may not be a complete list. Talk with your dietitian about what   dietary choices are best for you. Grains Whole-grain or whole-wheat bread. Whole-grain or whole-wheat pasta. Brown rice. Oatmeal. Quinoa. Bulgur. Whole-grain and low-sodium cereals. Pita bread. Low-fat, low-sodium crackers. Whole-wheat flour tortillas. Vegetables Fresh or frozen vegetables (raw, steamed, roasted, or grilled). Low-sodium or reduced-sodium tomato and vegetable juice. Low-sodium or reduced-sodium tomato sauce and tomato paste. Low-sodium or reduced-sodium canned vegetables. Fruits All fresh, dried, or frozen fruit. Canned fruit in natural juice (without  added sugar). Meat and other protein foods Skinless chicken or turkey. Ground chicken or turkey. Pork with fat trimmed off. Fish and seafood. Egg whites. Dried beans, peas, or lentils. Unsalted nuts, nut butters, and seeds. Unsalted canned beans. Lean cuts of beef with fat trimmed off. Low-sodium, lean deli meat. Dairy Low-fat (1%) or fat-free (skim) milk. Fat-free, low-fat, or reduced-fat cheeses. Nonfat, low-sodium ricotta or cottage cheese. Low-fat or nonfat yogurt. Low-fat, low-sodium cheese. Fats and oils Soft margarine without trans fats. Vegetable oil. Low-fat, reduced-fat, or light mayonnaise and salad dressings (reduced-sodium). Canola, safflower, olive, soybean, and sunflower oils. Avocado. Seasoning and other foods Herbs. Spices. Seasoning mixes without salt. Unsalted popcorn and pretzels. Fat-free sweets. What foods are not recommended? The items listed may not be a complete list. Talk with your dietitian about what dietary choices are best for you. Grains Baked goods made with fat, such as croissants, muffins, or some breads. Dry pasta or rice meal packs. Vegetables Creamed or fried vegetables. Vegetables in a cheese sauce. Regular canned vegetables (not low-sodium or reduced-sodium). Regular canned tomato sauce and paste (not low-sodium or reduced-sodium). Regular tomato and vegetable juice (not low-sodium or reduced-sodium). Pickles. Olives. Fruits Canned fruit in a light or heavy syrup. Fried fruit. Fruit in cream or butter sauce. Meat and other protein foods Fatty cuts of meat. Ribs. Fried meat. Bacon. Sausage. Bologna and other processed lunch meats. Salami. Fatback. Hotdogs. Bratwurst. Salted nuts and seeds. Canned beans with added salt. Canned or smoked fish. Whole eggs or egg yolks. Chicken or turkey with skin. Dairy Whole or 2% milk, cream, and half-and-half. Whole or full-fat cream cheese. Whole-fat or sweetened yogurt. Full-fat cheese. Nondairy creamers. Whipped toppings.  Processed cheese and cheese spreads. Fats and oils Butter. Stick margarine. Lard. Shortening. Ghee. Bacon fat. Tropical oils, such as coconut, palm kernel, or palm oil. Seasoning and other foods Salted popcorn and pretzels. Onion salt, garlic salt, seasoned salt, table salt, and sea salt. Worcestershire sauce. Tartar sauce. Barbecue sauce. Teriyaki sauce. Soy sauce, including reduced-sodium. Steak sauce. Canned and packaged gravies. Fish sauce. Oyster sauce. Cocktail sauce. Horseradish that you find on the shelf. Ketchup. Mustard. Meat flavorings and tenderizers. Bouillon cubes. Hot sauce and Tabasco sauce. Premade or packaged marinades. Premade or packaged taco seasonings. Relishes. Regular salad dressings. Where to find more information:  National Heart, Lung, and Blood Institute: www.nhlbi.nih.gov  American Heart Association: www.heart.org Summary  The DASH eating plan is a healthy eating plan that has been shown to reduce high blood pressure (hypertension). It may also reduce your risk for type 2 diabetes, heart disease, and stroke.  With the DASH eating plan, you should limit salt (sodium) intake to 2,300 mg a day. If you have hypertension, you may need to reduce your sodium intake to 1,500 mg a day.  When on the DASH eating plan, aim to eat more fresh fruits and vegetables, whole grains, lean proteins, low-fat dairy, and heart-healthy fats.  Work with your health care provider or diet and nutrition specialist (dietitian) to adjust your eating plan to your individual   calorie needs. This information is not intended to replace advice given to you by your health care provider. Make sure you discuss any questions you have with your health care provider. Document Released: 04/30/2011 Document Revised: 05/04/2016 Document Reviewed: 05/04/2016 Elsevier Interactive Patient Education  2017 Elsevier Inc.  

## 2017-01-27 ENCOUNTER — Ambulatory Visit: Payer: BLUE CROSS/BLUE SHIELD | Admitting: "Endocrinology

## 2017-02-18 ENCOUNTER — Ambulatory Visit (INDEPENDENT_AMBULATORY_CARE_PROVIDER_SITE_OTHER): Payer: Self-pay | Admitting: Nurse Practitioner

## 2017-02-18 ENCOUNTER — Encounter: Payer: Self-pay | Admitting: Nurse Practitioner

## 2017-02-18 VITALS — BP 134/85 | HR 76 | Temp 97.2°F | Ht 69.0 in | Wt 230.0 lb

## 2017-02-18 DIAGNOSIS — E1165 Type 2 diabetes mellitus with hyperglycemia: Secondary | ICD-10-CM

## 2017-02-18 DIAGNOSIS — E782 Mixed hyperlipidemia: Secondary | ICD-10-CM

## 2017-02-18 DIAGNOSIS — I2119 ST elevation (STEMI) myocardial infarction involving other coronary artery of inferior wall: Secondary | ICD-10-CM

## 2017-02-18 DIAGNOSIS — I219 Acute myocardial infarction, unspecified: Secondary | ICD-10-CM

## 2017-02-18 DIAGNOSIS — I10 Essential (primary) hypertension: Secondary | ICD-10-CM

## 2017-02-18 DIAGNOSIS — I25111 Atherosclerotic heart disease of native coronary artery with angina pectoris with documented spasm: Secondary | ICD-10-CM

## 2017-02-18 LAB — BAYER DCA HB A1C WAIVED: HB A1C (BAYER DCA - WAIVED): 10.8 % — ABNORMAL HIGH (ref <7.0)

## 2017-02-18 MED ORDER — SITAGLIPTIN PHOS-METFORMIN HCL 50-1000 MG PO TABS: 1.0000 | ORAL_TABLET | Freq: Two times a day (BID) | ORAL | 1 refills | Status: DC

## 2017-02-18 MED ORDER — AMLODIPINE BESYLATE 5 MG PO TABS: 5.0000 mg | ORAL_TABLET | Freq: Every day | ORAL | 1 refills | Status: DC

## 2017-02-18 MED ORDER — METOPROLOL TARTRATE 25 MG PO TABS: 25.0000 mg | ORAL_TABLET | Freq: Two times a day (BID) | ORAL | 1 refills | Status: DC

## 2017-02-18 MED ORDER — INSULIN GLARGINE 100 UNIT/ML SOLOSTAR PEN: 50.0000 [IU] | PEN_INJECTOR | Freq: Every day | SUBCUTANEOUS | 11 refills | Status: DC

## 2017-02-18 MED ORDER — ROSUVASTATIN CALCIUM 40 MG PO TABS: 20.0000 mg | ORAL_TABLET | Freq: Every day | ORAL | 5 refills | Status: DC

## 2017-02-18 MED ORDER — GLIMEPIRIDE 4 MG PO TABS: 4.0000 mg | ORAL_TABLET | Freq: Two times a day (BID) | ORAL | 1 refills | Status: DC

## 2017-02-18 MED ORDER — FENOFIBRIC ACID 135 MG PO CPDR: 1.0000 | DELAYED_RELEASE_CAPSULE | Freq: Every day | ORAL | 1 refills | Status: DC

## 2017-02-18 NOTE — Addendum Note (Signed)
Addended by: Cleda Daub on: 02/18/2017 09:40 AM   Modules accepted: Orders

## 2017-02-18 NOTE — Progress Notes (Signed)
Subjective:    Patient ID: Tyler Garner, male    DOB: 1956-04-01, 61 y.o.   MRN: [id number]  HPI  Tyler Garner is here today for follow up of chronic medical problem.  Outpatient Encounter Prescriptions as of 02/18/2017  Medication Sig  . amLODipine (NORVASC) 5 MG tablet Take 1 tablet (5 mg total) by mouth daily.  Marland Kitchen aspirin 81 MG tablet Take 81 mg by mouth daily.    . Choline Fenofibrate (FENOFIBRIC ACID) 135 MG CPDR Take 1 capsule by mouth daily.  . diclofenac (VOLTAREN) 75 MG EC tablet Take 1 tablet (75 mg total) by mouth 2 (two) times daily.  Marland Kitchen glimepiride (AMARYL) 4 MG tablet Take 1 tablet (4 mg total) by mouth 2 (two) times daily.  Marland Kitchen glucose blood (ACCU-CHEK ACTIVE STRIPS) test strip Check blood sugar daily and prn  Dx E11.9  . Insulin Glargine (LANTUS SOLOSTAR) 100 UNIT/ML Solostar Pen Inject 45 Units into the skin daily at 10 pm.  . Insulin Pen Needle (PEN NEEDLES) 31G X 8 MM MISC Use with lantus solosar pen daily  . metoprolol tartrate (LOPRESSOR) 25 MG tablet Take 1 tablet (25 mg total) by mouth 2 (two) times daily.  . rosuvastatin (CRESTOR) 40 MG tablet Take 0.5 tablets (20 mg total) by mouth daily.  . sitaGLIPtin-metformin (JANUMET) 50-1000 MG tablet Take 1 tablet by mouth 2 (two) times daily with a meal.   No facility-administered encounter medications on file as of 02/18/2017.     1. Type 2 diabetes mellitus with hyperglycemia, without long-term current use of insulin ([place]) last  hgba1c was 10.5% which was an improvement from 11.3%. We  Increased his lantus to 45u. He has not been checking blood sugars  2. Essential hypertension  No c/o chest pain, SOB or headache. His blood pressure was elevated at last visit so we added norvasc to meds. He does not check his blood pressure at home.  3. Mixed hyperlipidemia  Not been watching diet very closely  4. ST elevation myocardial infarction (STEMI) of inferolateral wall [facility])  He has had no problems since he had heart attack   5. Acute right ventricular myocardial infarction [facility])  Last cardiology follow up was in August of last year  6. Morbid obesity ([place])  Has had no recent weight changes.    New complaints: None today  Social history: Patient has not been taking care of hisself since his wife died several years ago. Patient say she just don't care. He claims he is not depressed and does not need any meds. He his had a little girll 6 months ago and he just loves her.    Review of Systems  Constitutional: Negative for activity change and appetite change.  HENT: Negative.   Eyes: Negative for pain.  Respiratory: Negative for shortness of breath.   Cardiovascular: Negative for chest pain, palpitations and leg swelling.  Gastrointestinal: Negative for abdominal pain.  Endocrine: Negative for polydipsia.  Genitourinary: Negative.   Skin: Negative for rash.  Neurological: Negative for dizziness, weakness and headaches.  Hematological: Does not bruise/bleed easily.  Psychiatric/Behavioral: Negative.   All other systems reviewed and are negative.      Objective:   Physical Exam  Constitutional: He is oriented to person, place, and time. He appears well-developed and well-nourished.  HENT:  Head: Normocephalic.  Right Ear: External ear normal.  Left Ear: External ear normal.  Nose: Nose normal.  Mouth/Throat: Oropharynx is clear and moist.  Eyes: Pupils are equal,  round, and reactive to light. EOM are normal.  Neck: Normal range of motion. Neck supple. No JVD present. No thyromegaly present.  Cardiovascular: Normal rate, regular rhythm, normal heart sounds and intact distal pulses.  Exam reveals no gallop and no friction rub.   No murmur heard. Pulmonary/Chest: Effort normal and breath sounds normal. No respiratory distress. He has no wheezes. He has no rales. He exhibits no tenderness.  Abdominal: Soft. Bowel sounds are normal. He exhibits no mass. There is no tenderness.  Genitourinary: Prostate  normal and penis normal.  Musculoskeletal: Normal range of motion. He exhibits no edema.  Lymphadenopathy:    He has no cervical adenopathy.  Neurological: He is alert and oriented to person, place, and time. No cranial nerve deficit.  Skin: Skin is warm and dry.  Psychiatric: He has a normal mood and affect. His behavior is normal. Judgment and thought content normal.    BP 134/85   Pulse 76   Temp (!) 97.2 F (36.2 C) (Oral)   Ht _0  (1.753 m)   Wt 230 lb (104.3 kg)   BMI 33.97 kg/m   HGBA1C- 10.8%- no better     Assessment & Plan:  1. Type 2 diabetes mellitus with hyperglycemia, without long-term current use of insulin (HCC) Low carb diet Strict carb counting - Bayer DCA Hb A1c Waived - glimepiride (AMARYL) 4 MG tablet; Take 1 tablet (4 mg total) by mouth 2 (two) times daily.  Dispense: 180 tablet; Refill: 1 - sitaGLIPtin-metformin (JANUMET) 50-1000 MG tablet; Take 1 tablet by mouth 2 (two) times daily with a meal.  Dispense: 180 tablet; Refill: 1 - Insulin Glargine (LANTUS SOLOSTAR) 100 UNIT/ML Solostar Pen; Inject 50 Units into the skin daily at 10 pm.  Dispense: 15 mL; Refill: 11  2. Essential hypertension Low sodium diet - CMP14+EGFR - amLODipine (NORVASC) 5 MG tablet; Take 1 tablet (5 mg total) by mouth daily.  Dispense: 90 tablet; Refill: 1  3. Mixed hyperlipidemia Low fat diet - Lipid panel - rosuvastatin (CRESTOR) 40 MG tablet; Take 0.5 tablets (20 mg total) by mouth daily.  Dispense: 45 tablet; Refill: 5 - Choline Fenofibrate (FENOFIBRIC ACID) 135 MG CPDR; Take 1 capsule by mouth daily.  Dispense: 90 capsule; Refill: 1  4. ST elevation myocardial infarction (STEMI) of inferolateral wall ([place]) Needs to follow up with cardiologist  5. Acute right ventricular myocardial infarction ([place])  6. Morbid obesity ([place]) Discussed diet and exercise for person with BMI >25 Will recheck weight in 3-6 months  7. Atherosclerosis of native coronary artery of native heart  with angina pectoris with documented spasm (HCC) - metoprolol tartrate (LOPRESSOR) 25 MG tablet; Take 1 tablet (25 mg total) by mouth 2 (two) times daily.  Dispense: 180 tablet; Refill: 1    Labs pending Health maintenance reviewed Diet and exercise encouraged Continue all meds Follow up  In 3 months   [place], [name]

## 2017-02-18 NOTE — Patient Instructions (Signed)

## 2017-02-19 LAB — CMP14+EGFR
A/G RATIO: 2 (ref 1.2–2.2)
ALBUMIN: 4.7 g/dL (ref 3.6–4.8)
ALK PHOS: 57 IU/L (ref 39–117)
ALT: 35 IU/L (ref 0–44)
AST: 42 IU/L — ABNORMAL HIGH (ref 0–40)
BILIRUBIN TOTAL: 0.3 mg/dL (ref 0.0–1.2)
BUN / CREAT RATIO: 21 (ref 10–24)
BUN: 21 mg/dL (ref 8–27)
CALCIUM: 9.8 mg/dL (ref 8.6–10.2)
CHLORIDE: 97 mmol/L (ref 96–106)
CO2: 24 mmol/L (ref 20–29)
Creatinine, Ser: 1.01 mg/dL (ref 0.76–1.27)
GFR calc non Af Amer: 79 mL/min/{1.73_m2} (ref >59)
GFR, EST AFRICAN AMERICAN: 92 mL/min/{1.73_m2} (ref >59)
GLUCOSE: 266 mg/dL — AB (ref 65–99)
Globulin, Total: 2.3 g/dL (ref 1.5–4.5)
Potassium: 5.2 mmol/L (ref 3.5–5.2)
SODIUM: 137 mmol/L (ref 134–144)
TOTAL PROTEIN: 7 g/dL (ref 6.0–8.5)

## 2017-06-15 ENCOUNTER — Ambulatory Visit: Payer: PRIVATE HEALTH INSURANCE | Admitting: Nurse Practitioner

## 2017-06-15 ENCOUNTER — Encounter: Payer: Self-pay | Admitting: Nurse Practitioner

## 2017-06-15 VITALS — BP 155/81 | HR 77 | Temp 97.4°F | Ht 69.0 in | Wt 234.0 lb

## 2017-06-15 DIAGNOSIS — I10 Essential (primary) hypertension: Secondary | ICD-10-CM

## 2017-06-15 DIAGNOSIS — E1165 Type 2 diabetes mellitus with hyperglycemia: Secondary | ICD-10-CM | POA: Diagnosis not present

## 2017-06-15 DIAGNOSIS — I25111 Atherosclerotic heart disease of native coronary artery with angina pectoris with documented spasm: Secondary | ICD-10-CM

## 2017-06-15 DIAGNOSIS — E782 Mixed hyperlipidemia: Secondary | ICD-10-CM | POA: Diagnosis not present

## 2017-06-15 DIAGNOSIS — I2119 ST elevation (STEMI) myocardial infarction involving other coronary artery of inferior wall: Secondary | ICD-10-CM | POA: Diagnosis not present

## 2017-06-15 LAB — BAYER DCA HB A1C WAIVED: HB A1C: 10 % — AB (ref <7.0)

## 2017-06-15 MED ORDER — SITAGLIPTIN PHOS-METFORMIN HCL 50-1000 MG PO TABS: 1.0000 | ORAL_TABLET | Freq: Two times a day (BID) | ORAL | 1 refills | Status: DC

## 2017-06-15 MED ORDER — AMLODIPINE BESYLATE 5 MG PO TABS: 5.0000 mg | ORAL_TABLET | Freq: Every day | ORAL | 1 refills | Status: DC

## 2017-06-15 MED ORDER — METOPROLOL TARTRATE 25 MG PO TABS: 25.0000 mg | ORAL_TABLET | Freq: Two times a day (BID) | ORAL | 1 refills | Status: DC

## 2017-06-15 MED ORDER — ROSUVASTATIN CALCIUM 40 MG PO TABS: 20.0000 mg | ORAL_TABLET | Freq: Every day | ORAL | 5 refills | Status: DC

## 2017-06-15 MED ORDER — PEN NEEDLES 31G X 8 MM MISC: 2 refills | Status: DC

## 2017-06-15 MED ORDER — GLIMEPIRIDE 4 MG PO TABS: 4.0000 mg | ORAL_TABLET | Freq: Two times a day (BID) | ORAL | 1 refills | Status: DC

## 2017-06-15 MED ORDER — INSULIN GLARGINE 100 UNIT/ML SOLOSTAR PEN: 60.0000 [IU] | PEN_INJECTOR | Freq: Every day | SUBCUTANEOUS | 11 refills | Status: DC

## 2017-06-15 NOTE — Patient Instructions (Signed)
Diabetes Mellitus and Nutrition When you have diabetes (diabetes mellitus), it is very important to have healthy eating habits because your blood sugar (glucose) levels are greatly affected by what you eat and drink. Eating healthy foods in the appropriate amounts, at about the same times every day, can help you:  Control your blood glucose.  Lower your risk of heart disease.  Improve your blood pressure.  Reach or maintain a healthy weight.  Every person with diabetes is different, and each person has different needs for a meal plan. Your health care provider may recommend that you work with a diet and nutrition specialist (dietitian) to make a meal plan that is best for you. Your meal plan may vary depending on factors such as:  The calories you need.  The medicines you take.  Your weight.  Your blood glucose, blood pressure, and cholesterol levels.  Your activity level.  Other health conditions you have, such as heart or kidney disease.  How do carbohydrates affect me? Carbohydrates affect your blood glucose level more than any other type of food. Eating carbohydrates naturally increases the amount of glucose in your blood. Carbohydrate counting is a method for keeping track of how many carbohydrates you eat. Counting carbohydrates is important to keep your blood glucose at a healthy level, especially if you use insulin or take certain oral diabetes medicines. It is important to know how many carbohydrates you can safely have in each meal. This is different for every person. Your dietitian can help you calculate how many carbohydrates you should have at each meal and for snack. Foods that contain carbohydrates include:  Bread, cereal, rice, pasta, and crackers.  Potatoes and corn.  Peas, beans, and lentils.  Milk and yogurt.  Fruit and juice.  Desserts, such as cakes, cookies, ice cream, and candy.  How does alcohol affect me? Alcohol can cause a sudden decrease in blood  glucose (hypoglycemia), especially if you use insulin or take certain oral diabetes medicines. Hypoglycemia can be a life-threatening condition. Symptoms of hypoglycemia (sleepiness, dizziness, and confusion) are similar to symptoms of having too much alcohol. If your health care provider says that alcohol is safe for you, follow these guidelines:  Limit alcohol intake to no more than 1 drink per day for nonpregnant women and 2 drinks per day for men. One drink equals 12 oz of beer, 5 oz of wine, or 1 oz of hard liquor.  Do not drink on an empty stomach.  Keep yourself hydrated with water, diet soda, or unsweetened iced tea.  Keep in mind that regular soda, juice, and other mixers may contain a lot of sugar and must be counted as carbohydrates.  What are tips for following this plan? Reading food labels  Start by checking the serving size on the label. The amount of calories, carbohydrates, fats, and other nutrients listed on the label are based on one serving of the food. Many foods contain more than one serving per package.  Check the total grams (g) of carbohydrates in one serving. You can calculate the number of servings of carbohydrates in one serving by dividing the total carbohydrates by 15. For example, if a food has 30 g of total carbohydrates, it would be equal to 2 servings of carbohydrates.  Check the number of grams (g) of saturated and trans fats in one serving. Choose foods that have low or no amount of these fats.  Check the number of milligrams (mg) of sodium in one serving. Most people   should limit total sodium intake to less than 2,300 mg per day.  Always check the nutrition information of foods labeled as "low-fat" or "nonfat". These foods may be higher in added sugar or refined carbohydrates and should be avoided.  Talk to your dietitian to identify your daily goals for nutrients listed on the label. Shopping  Avoid buying canned, premade, or processed foods. These  foods tend to be high in fat, sodium, and added sugar.  Shop around the outside edge of the grocery store. This includes fresh fruits and vegetables, bulk grains, fresh meats, and fresh dairy. Cooking  Use low-heat cooking methods, such as baking, instead of high-heat cooking methods like deep frying.  Cook using healthy oils, such as olive, canola, or sunflower oil.  Avoid cooking with butter, cream, or high-fat meats. Meal planning  Eat meals and snacks regularly, preferably at the same times every day. Avoid going long periods of time without eating.  Eat foods high in fiber, such as fresh fruits, vegetables, beans, and whole grains. Talk to your dietitian about how many servings of carbohydrates you can eat at each meal.  Eat 4-6 ounces of lean protein each day, such as lean meat, chicken, fish, eggs, or tofu. 1 ounce is equal to 1 ounce of meat, chicken, or fish, 1 egg, or 1/4 cup of tofu.  Eat some foods each day that contain healthy fats, such as avocado, nuts, seeds, and fish. Lifestyle   Check your blood glucose regularly.  Exercise at least 30 minutes 5 or more days each week, or as told by your health care provider.  Take medicines as told by your health care provider.  Do not use any products that contain nicotine or tobacco, such as cigarettes and e-cigarettes. If you need help quitting, ask your health care provider.  Work with a counselor or diabetes educator to identify strategies to manage stress and any emotional and social challenges. What are some questions to ask my health care provider?  Do I need to meet with a diabetes educator?  Do I need to meet with a dietitian?  What number can I call if I have questions?  When are the best times to check my blood glucose? Where to find more information:  American Diabetes Association: diabetes.org/food-and-fitness/food  Academy of Nutrition and Dietetics:  www.eatright.org/resources/health/diseases-and-conditions/diabetes  National Institute of Diabetes and Digestive and Kidney Diseases (NIH): www.niddk.nih.gov/health-information/diabetes/overview/diet-eating-physical-activity Summary  A healthy meal plan will help you control your blood glucose and maintain a healthy lifestyle.  Working with a diet and nutrition specialist (dietitian) can help you make a meal plan that is best for you.  Keep in mind that carbohydrates and alcohol have immediate effects on your blood glucose levels. It is important to count carbohydrates and to use alcohol carefully. This information is not intended to replace advice given to you by your health care provider. Make sure you discuss any questions you have with your health care provider. Document Released: 02/05/2005 Document Revised: 06/15/2016 Document Reviewed: 06/15/2016 Elsevier Interactive Patient Education  2018 Elsevier Inc.  

## 2017-06-15 NOTE — Progress Notes (Signed)
Subjective:    Patient ID: Tyler Garner, male    DOB: 1956/01/18, 62 y.o.   MRN: [id number]  HPI  Tyler Garner is here today for follow up of chronic medical problem.  Outpatient Encounter Medications as of 06/15/2017  Medication Sig  . amLODipine (NORVASC) 5 MG tablet Take 1 tablet (5 mg total) by mouth daily.  Marland Kitchen aspirin 81 MG tablet Take 81 mg by mouth daily.    . diclofenac (VOLTAREN) 75 MG EC tablet Take 1 tablet (75 mg total) by mouth 2 (two) times daily.  Marland Kitchen glimepiride (AMARYL) 4 MG tablet Take 1 tablet (4 mg total) by mouth 2 (two) times daily.  Marland Kitchen glucose blood (ACCU-CHEK ACTIVE STRIPS) test strip Check blood sugar daily and prn  Dx E11.9  . Insulin Glargine (LANTUS SOLOSTAR) 100 UNIT/ML Solostar Pen Inject 50 Units into the skin daily at 10 pm.  . Insulin Pen Needle (PEN NEEDLES) 31G X 8 MM MISC Use with lantus solosar pen daily  . metoprolol tartrate (LOPRESSOR) 25 MG tablet Take 1 tablet (25 mg total) by mouth 2 (two) times daily.  . rosuvastatin (CRESTOR) 40 MG tablet Take 0.5 tablets (20 mg total) by mouth daily.  . sitaGLIPtin-metformin (JANUMET) 50-1000 MG tablet Take 1 tablet by mouth 2 (two) times daily with a meal.  . Choline Fenofibrate (FENOFIBRIC ACID) 135 MG CPDR Take 1 capsule by mouth daily. (Patient not taking: Reported on 06/15/2017)     1. Type 2 diabetes mellitus with hyperglycemia, without long-term current use of insulin (HCC) last hgba1c was 10.8%. lantus was increased to 50u daily along with stricter diet. He does not check blood sugars like he should.  2. Essential hypertension no c/o of recent chest pain, sob or headache. Does not check blood pressure at home. BP Readings from Last 3 Encounters:  06/15/17 (!) 155/81  02/18/17 134/85  12/21/16 (!) 143/79     3. Mixed hyperlipidemia  Does not watch diet and very little exercise  4. Atherosclerosis of native coronary artery of native heart with angina pectoris with documented spasm [facility]) saw  cardiology last in 12/2016. Has follow up next month.  5. ST elevation myocardial infarction (STEMI) of inferolateral wall (HCC)  Again no recent chest pain  6. Morbid obesity ([place])  No recent weight changes    New complaints: none today  Social history: Wife died several years ago and he still hasnt quite got ove rit.    Review of Systems  Constitutional: Negative for activity change and appetite change.  HENT: Negative.   Eyes: Negative for pain.  Respiratory: Negative for shortness of breath.   Cardiovascular: Negative for chest pain, palpitations and leg swelling.  Gastrointestinal: Negative for abdominal pain.  Endocrine: Negative for polydipsia.  Genitourinary: Negative.   Skin: Negative for rash.  Neurological: Negative for dizziness, weakness and headaches.  Hematological: Does not bruise/bleed easily.  Psychiatric/Behavioral: Negative.   All other systems reviewed and are negative.      Objective:   Physical Exam  Constitutional: He is oriented to person, place, and time. He appears well-developed and well-nourished.  HENT:  Head: Normocephalic.  Right Ear: External ear normal.  Left Ear: External ear normal.  Nose: Nose normal.  Mouth/Throat: Oropharynx is clear and moist.  Eyes: EOM are normal. Pupils are equal, round, and reactive to light.  Neck: Normal range of motion. Neck supple. No JVD present. No thyromegaly present.  Cardiovascular: Normal rate, regular rhythm, normal heart sounds and intact  distal pulses. Exam reveals no gallop and no friction rub.  No murmur heard. Pulmonary/Chest: Effort normal and breath sounds normal. No respiratory distress. He has no wheezes. He has no rales. He exhibits no tenderness.  Abdominal: Soft. Bowel sounds are normal. He exhibits no mass. There is no tenderness.  Genitourinary: Prostate normal and penis normal.  Musculoskeletal: Normal range of motion. He exhibits no edema.  Lymphadenopathy:    He has no cervical  adenopathy.  Neurological: He is alert and oriented to person, place, and time. No cranial nerve deficit.  Skin: Skin is warm and dry.  Psychiatric: He has a normal mood and affect. His behavior is normal. Judgment and thought content normal.    BP (!) 155/81   Pulse 77   Temp (!) 97.4 F (36.3 C) (Oral)   Ht _0  (1.753 m)   Wt 234 lb (106.1 kg)   BMI 34.56 kg/m   hgba1c 10.0       Assessment & Plan:  1. Type 2 diabetes mellitus with hyperglycemia, without long-term current use of insulin (HCC) Increase lantus to 55 u for 3 days then 60u - Bayer DCA Hb A1c Waived - Insulin Pen Needle (PEN NEEDLES) 31G X 8 MM MISC; Use with lantus solosar pen daily  Dispense: 100 each; Refill: 2 - Insulin Glargine (LANTUS SOLOSTAR) 100 UNIT/ML Solostar Pen; Inject 60 Units into the skin daily at 10 pm.  Dispense: 15 mL; Refill: 11 - glimepiride (AMARYL) 4 MG tablet; Take 1 tablet (4 mg total) by mouth 2 (two) times daily.  Dispense: 180 tablet; Refill: 1 - sitaGLIPtin-metformin (JANUMET) 50-1000 MG tablet; Take 1 tablet by mouth 2 (two) times daily with a meal.  Dispense: 180 tablet; Refill: 1  2. Essential hypertension Low sodium diet - CMP14+EGFR - amLODipine (NORVASC) 5 MG tablet; Take 1 tablet (5 mg total) by mouth daily.  Dispense: 90 tablet; Refill: 1  3. Mixed hyperlipidemia Low fat diet - Lipid panel - rosuvastatin (CRESTOR) 40 MG tablet; Take 0.5 tablets (20 mg total) by mouth daily.  Dispense: 45 tablet; Refill: 5  4. Atherosclerosis of native coronary artery of native heart with angina pectoris with documented spasm ([place]) Keep follow up with crdiology - metoprolol tartrate (LOPRESSOR) 25 MG tablet; Take 1 tablet (25 mg total) by mouth 2 (two) times daily.  Dispense: 180 tablet; Refill: 1  5. ST elevation myocardial infarction (STEMI) of inferolateral wall (HCC)  6. Morbid obesity ([place]) Discussed diet and exercise for person with BMI >25 Will recheck weight in 3-6  months     Labs pending Health maintenance reviewed Diet and exercise encouraged Continue all meds Follow up  In 3 months   [place], [name]

## 2017-06-16 LAB — LIPID PANEL
CHOLESTEROL TOTAL: 164 mg/dL (ref 100–199)
Chol/HDL Ratio: 6.8 ratio — ABNORMAL HIGH (ref 0.0–5.0)
HDL: 24 mg/dL — ABNORMAL LOW (ref >39)
TRIGLYCERIDES: 424 mg/dL — AB (ref 0–149)

## 2017-06-16 LAB — CMP14+EGFR
A/G RATIO: 2 (ref 1.2–2.2)
ALK PHOS: 55 IU/L (ref 39–117)
ALT: 31 IU/L (ref 0–44)
AST: 33 IU/L (ref 0–40)
Albumin: 4.7 g/dL (ref 3.6–4.8)
BILIRUBIN TOTAL: 0.3 mg/dL (ref 0.0–1.2)
BUN/Creatinine Ratio: 14 (ref 10–24)
BUN: 11 mg/dL (ref 8–27)
CALCIUM: 9.5 mg/dL (ref 8.6–10.2)
CHLORIDE: 101 mmol/L (ref 96–106)
CO2: 26 mmol/L (ref 20–29)
Creatinine, Ser: 0.8 mg/dL (ref 0.76–1.27)
GFR calc Af Amer: 111 mL/min/{1.73_m2} (ref >59)
GFR calc non Af Amer: 96 mL/min/{1.73_m2} (ref >59)
Globulin, Total: 2.3 g/dL (ref 1.5–4.5)
Glucose: 251 mg/dL — ABNORMAL HIGH (ref 65–99)
POTASSIUM: 5.1 mmol/L (ref 3.5–5.2)
SODIUM: 145 mmol/L — AB (ref 134–144)
Total Protein: 7 g/dL (ref 6.0–8.5)

## 2017-07-05 ENCOUNTER — Other Ambulatory Visit: Payer: Self-pay | Admitting: *Deleted

## 2017-07-05 DIAGNOSIS — M25511 Pain in right shoulder: Principal | ICD-10-CM

## 2017-07-05 DIAGNOSIS — G8929 Other chronic pain: Secondary | ICD-10-CM

## 2017-07-05 DIAGNOSIS — M25512 Pain in left shoulder: Principal | ICD-10-CM

## 2017-07-05 MED ORDER — DICLOFENAC SODIUM 75 MG PO TBEC: 75.0000 mg | DELAYED_RELEASE_TABLET | Freq: Two times a day (BID) | ORAL | 0 refills | Status: DC

## 2017-07-16 ENCOUNTER — Ambulatory Visit (INDEPENDENT_AMBULATORY_CARE_PROVIDER_SITE_OTHER): Payer: PRIVATE HEALTH INSURANCE | Admitting: Nurse Practitioner

## 2017-07-16 ENCOUNTER — Encounter: Payer: Self-pay | Admitting: Nurse Practitioner

## 2017-07-16 VITALS — BP 141/76 | HR 75 | Temp 97.2°F | Ht 69.0 in | Wt 234.0 lb

## 2017-07-16 DIAGNOSIS — E1165 Type 2 diabetes mellitus with hyperglycemia: Secondary | ICD-10-CM | POA: Diagnosis not present

## 2017-07-16 DIAGNOSIS — M25512 Pain in left shoulder: Secondary | ICD-10-CM

## 2017-07-16 DIAGNOSIS — M25511 Pain in right shoulder: Secondary | ICD-10-CM | POA: Diagnosis not present

## 2017-07-16 LAB — BAYER DCA HB A1C WAIVED: HB A1C (BAYER DCA - WAIVED): 9.2 % — ABNORMAL HIGH (ref <7.0)

## 2017-07-16 MED ORDER — MELOXICAM 15 MG PO TABS: 15.0000 mg | ORAL_TABLET | Freq: Every day | ORAL | 3 refills | Status: DC

## 2017-07-16 NOTE — Patient Instructions (Signed)

## 2017-07-16 NOTE — Progress Notes (Signed)
   Subjective:    Patient ID: Tyler Garner, male    DOB: 21-Mar-1956, 62 y.o.   MRN: [id number]  HPI  Patient comes in today for follow up of diabetes. Since his wife died several years ago his diabetes has gotten out of control. His last HGBA1C was 10.0. He was told that if he did not get under control he was going to have  We increased his lantus to 60u daily.  * arthritis meds , diclofenac, have not been helping- would like to try something else.  Review of Systems  Constitutional: Negative.   Respiratory: Negative.   Cardiovascular: Negative.   Genitourinary: Negative.   Neurological: Negative.   Psychiatric/Behavioral: Negative.   All other systems reviewed and are negative.      Objective:   Physical Exam  Constitutional: He is oriented to person, place, and time. He appears well-developed and well-nourished. No distress.  Cardiovascular: Normal rate and regular rhythm.  Pulmonary/Chest: Effort normal and breath sounds normal.  Neurological: He is alert and oriented to person, place, and time.  Skin: Skin is warm.  Psychiatric: He has a normal mood and affect. His behavior is normal. Judgment and thought content normal.    BP (!) 141/76   Pulse 75   Temp (!) 97.2 F (36.2 C) (Oral)   Ht 5\' 9"  (1.753 m)   Wt 234 lb (106.1 kg)   BMI 34.56 kg/m   hgba1c 9.2%     Assessment & Plan:  1. Type 2 diabetes mellitus with hyperglycemia, without long-term current use of insulin (HCC) Continue to watch carbs in diet Increase lantus to 65u daily - Bayer DCA Hb A1c Waived  2. Pain of both shoulder joints Exercise as much as can - meloxicam (MOBIC) 15 MG tablet; Take 1 tablet (15 mg total) by mouth daily.  Dispense: 30 tablet; Refill: 3  [name] Daphine Deutscher, [name]

## 2017-08-17 ENCOUNTER — Encounter: Payer: Self-pay | Admitting: Family Medicine

## 2017-08-17 ENCOUNTER — Ambulatory Visit: Payer: PRIVATE HEALTH INSURANCE | Admitting: Family Medicine

## 2017-08-17 VITALS — BP 150/85 | HR 82 | Temp 97.8°F | Ht 69.0 in | Wt 231.0 lb

## 2017-08-17 DIAGNOSIS — J0100 Acute maxillary sinusitis, unspecified: Secondary | ICD-10-CM | POA: Diagnosis not present

## 2017-08-17 MED ORDER — FLUTICASONE PROPIONATE 50 MCG/ACT NA SUSP: 2.0000 | Freq: Every day | NASAL | 6 refills | Status: DC

## 2017-08-17 MED ORDER — LORATADINE 10 MG PO TABS: 10.0000 mg | ORAL_TABLET | Freq: Every day | ORAL | 11 refills | Status: DC

## 2017-08-17 MED ORDER — AMOXICILLIN-POT CLAVULANATE 875-125 MG PO TABS: 1.0000 | ORAL_TABLET | Freq: Two times a day (BID) | ORAL | 0 refills | Status: DC

## 2017-08-17 NOTE — Progress Notes (Signed)
Subjective: CC: sinus issues PCP: Bennie Pierini, [name] ZOX:[id number] Tyler Garner is a 62 y.o. male presenting to clinic today for:  1. Sinus symptoms Patient reports that he has had over a one-week history of productive cough, sinus fullness and congestion and sinus headache.  Denies purulent discharge.  He denies associated hemoptysis, shortness of breath, wheeze, fevers, chills, nausea, vomiting, diarrhea.  He has been using Robitussin-DM, Coricidin and some leftover cough syrup from the last time he was sick last year with little improvement in symptoms.  He is not currently on an antihistamine or using any nasal sprays.   ROS: Per HPI  Allergies  Allergen Reactions  . Percocet [Oxycodone-Acetaminophen] Nausea And Vomiting   Past Medical History:  Diagnosis Date  . CAD (coronary artery disease)    a. inferior STEMI (05/2013) with RV involvement- LHC:  dist CFX occluded (Promus premier (2.5x24 mm) DES); EF 40-45% with inf HK;  b. Echo (report pending) with EF 45-50%, lat WMA, RVE, mod RV hypokinesis   . Diabetes mellitus without complication (HCC)   . Hyperglycemia   . Hyperlipidemia   . Hypertension   . Ischemic cardiomyopathy   . Nicotine addiction   . Renal colic   . Right shoulder injury 10/11/09    Current Outpatient Medications:  .  amLODipine (NORVASC) 5 MG tablet, Take 1 tablet (5 mg total) by mouth daily., Disp: 90 tablet, Rfl: 1 .  aspirin 81 MG tablet, Take 81 mg by mouth daily.  , Disp: , Rfl:  .  glimepiride (AMARYL) 4 MG tablet, Take 1 tablet (4 mg total) by mouth 2 (two) times daily., Disp: 180 tablet, Rfl: 1 .  glucose blood (ACCU-CHEK ACTIVE STRIPS) test strip, Check blood sugar daily and prn  Dx E11.9, Disp: 100 each, Rfl: 12 .  Insulin Glargine (LANTUS SOLOSTAR) 100 UNIT/ML Solostar Pen, Inject 60 Units into the skin daily at 10 pm., Disp: 15 mL, Rfl: 11 .  Insulin Pen Needle (PEN NEEDLES) 31G X 8 MM MISC, Use with lantus solosar pen daily, Disp: 100 each,  Rfl: 2 .  meloxicam (MOBIC) 15 MG tablet, Take 1 tablet (15 mg total) by mouth daily., Disp: 30 tablet, Rfl: 3 .  metoprolol tartrate (LOPRESSOR) 25 MG tablet, Take 1 tablet (25 mg total) by mouth 2 (two) times daily., Disp: 180 tablet, Rfl: 1 .  rosuvastatin (CRESTOR) 40 MG tablet, Take 0.5 tablets (20 mg total) by mouth daily., Disp: 45 tablet, Rfl: 5 .  sitaGLIPtin-metformin (JANUMET) 50-1000 MG tablet, Take 1 tablet by mouth 2 (two) times daily with a meal., Disp: 180 tablet, Rfl: 1 Social History   Socioeconomic History  . Marital status: Married    Spouse name: Not on file  . Number of children: Not on file  . Years of education: Not on file  . Highest education level: Not on file  Occupational History  . Not on file  Social Needs  . Financial resource strain: Not on file  . Food insecurity:    Worry: Not on file    Inability: Not on file  . Transportation needs:    Medical: Not on file    Non-medical: Not on file  Tobacco Use  . Smoking status: Former Games developer  . Smokeless tobacco: Former Engineer, water and Sexual Activity  . Alcohol use: Yes  . Drug use: No  . Sexual activity: Not on file  Lifestyle  . Physical activity:    Days per week: Not on file  Minutes per session: Not on file  . Stress: Not on file  Relationships  . Social connections:    Talks on phone: Not on file    Gets together: Not on file    Attends religious service: Not on file    Active member of club or organization: Not on file    Attends meetings of clubs or organizations: Not on file    Relationship status: Not on file  . Intimate partner violence:    Fear of current or ex partner: Not on file    Emotionally abused: Not on file    Physically abused: Not on file    Forced sexual activity: Not on file  Other Topics Concern  . Not on file  Social History Narrative  . Not on file   Family History  Problem Relation Age of Onset  . Diabetes Mother   . ALS Mother   . Alzheimer's disease  Father   . Diabetes Brother     Objective: Office vital signs reviewed. BP (!) 150/85   Pulse 82   Temp 97.8 F (36.6 C) (Oral)   Ht 5\' 9"  (1.753 m)   Wt 231 lb (104.8 kg)   BMI 34.11 kg/m   Physical Examination:  General: Awake, alert, well nourished, No acute distress HEENT: mild TTP to maxillary sinuses    Neck: No masses palpated. No lymphadenopathy    Ears: Tympanic membranes intact, normal light reflex, no erythema, no bulging    Eyes: PERRLA, extraocular membranes intact, sclera white    Nose: nasal turbinates moist, clear nasal discharge    Throat: moist mucus membranes, no erythema, no tonsillar exudate.  Airway is patent Cardio: regular rate and rhythm, S1S2 heard, no murmurs appreciated Pulm: clear to auscultation bilaterally, no wheezes, rhonchi or rales; normal work of breathing on room air  Assessment/ Plan: 62 y.o. male   1. Acute non-recurrent maxillary sinusitis Patient does not appear infected at this time.  He is afebrile and nontoxic-appearing.  I suspect that this is more likely allergy mediated.  I have ordered Claritin 10 mg for him to take daily and Flonase nasal spray to use 2 sprays in each nostril daily.  I recommended that he start these immediately and continue for at least a few days before starting the pocket prescription of Augmentin.  He voiced good understanding.  Home care instructions were reviewed and a copy was provided. Strict return precautions and reasons for emergent evaluation in the emergency department review with patient.  He voiced understanding and will follow-up as needed.   Meds ordered this encounter  Medications  . fluticasone (FLONASE) 50 MCG/ACT nasal spray    Sig: Place 2 sprays into both nostrils daily.    Dispense:  16 g    Refill:  6  . loratadine (CLARITIN) 10 MG tablet    Sig: Take 1 tablet (10 mg total) by mouth daily.    Dispense:  30 tablet    Refill:  11  . amoxicillin-clavulanate (AUGMENTIN) 875-125 MG tablet      Sig: Take 1 tablet by mouth 2 (two) times daily.    Dispense:  20 tablet    Refill:  0     Tyler Garner Hulen Skains, DO [facility] [phone]

## 2017-08-17 NOTE — Patient Instructions (Addendum)
Your blood pressure was high today.  Please follow up in 2 weeks for recheck of your Blood pressure with Paulene Floor.  I have given you allergy medications to start.  It is difficult to tell at this point if this is infectious or allergy mediated.  I want you to start the allergy medication FIRST.  Take this for the next few days.  If you notice NO improvement, you can start the antibiotic.     - Get plenty of rest and drink plenty of fluids. - Try to breathe moist air. Use a cold mist humidifier. - Consume warm fluids (soup or tea) to provide relief for a stuffy nose and to loosen phlegm. - For nasal stuffiness, try saline nasal spray or a Neti Pot.  Afrin nasal spray can also be used but this product should not be used longer than 3 days or it will cause rebound nasal stuffiness (worsening nasal congestion). - For sore throat pain relief: suck on throat lozenges, hard candy or popsicles; gargle with warm salt water (1/4 tsp. salt per 8 oz. of water); and eat soft, bland foods. - Eat a well-balanced diet. If you cannot, ensure you are getting enough nutrients by taking a daily multivitamin. - Avoid dairy products, as they can thicken phlegm. - Avoid alcohol, as it impairs your body's immune system.  CONTACT YOUR DOCTOR IF YOU EXPERIENCE ANY OF THE FOLLOWING: - High fever - Ear pain - Sinus-type headache - Unusually severe cold symptoms - Cough that gets worse while other cold symptoms improve - Flare up of any chronic lung problem, such as asthma - Your symptoms persist longer than 2 weeks

## 2017-09-23 ENCOUNTER — Telehealth: Payer: Self-pay | Admitting: *Deleted

## 2017-09-23 NOTE — Telephone Encounter (Signed)
Left detailed message for pt regarding Lantus samples ready for pick up in refrig

## 2017-10-07 ENCOUNTER — Other Ambulatory Visit: Payer: Self-pay | Admitting: Nurse Practitioner

## 2017-10-07 DIAGNOSIS — M25511 Pain in right shoulder: Secondary | ICD-10-CM

## 2017-10-07 DIAGNOSIS — M25512 Pain in left shoulder: Principal | ICD-10-CM

## 2017-10-15 ENCOUNTER — Encounter: Payer: Self-pay | Admitting: Nurse Practitioner

## 2017-10-15 ENCOUNTER — Ambulatory Visit: Payer: PRIVATE HEALTH INSURANCE | Admitting: Nurse Practitioner

## 2017-10-15 VITALS — BP 137/84 | HR 69 | Temp 97.5°F | Ht 69.0 in | Wt 231.0 lb

## 2017-10-15 DIAGNOSIS — E1165 Type 2 diabetes mellitus with hyperglycemia: Secondary | ICD-10-CM | POA: Diagnosis not present

## 2017-10-15 DIAGNOSIS — G8929 Other chronic pain: Secondary | ICD-10-CM | POA: Diagnosis not present

## 2017-10-15 DIAGNOSIS — I25111 Atherosclerotic heart disease of native coronary artery with angina pectoris with documented spasm: Secondary | ICD-10-CM | POA: Diagnosis not present

## 2017-10-15 DIAGNOSIS — E7800 Pure hypercholesterolemia, unspecified: Secondary | ICD-10-CM

## 2017-10-15 DIAGNOSIS — M25511 Pain in right shoulder: Secondary | ICD-10-CM | POA: Diagnosis not present

## 2017-10-15 DIAGNOSIS — I10 Essential (primary) hypertension: Secondary | ICD-10-CM

## 2017-10-15 DIAGNOSIS — E782 Mixed hyperlipidemia: Secondary | ICD-10-CM

## 2017-10-15 LAB — LIPID PANEL
CHOLESTEROL TOTAL: 157 mg/dL (ref 100–199)
Chol/HDL Ratio: 6.5 ratio — ABNORMAL HIGH (ref 0.0–5.0)
HDL: 24 mg/dL — ABNORMAL LOW (ref >39)
Triglycerides: 406 mg/dL — ABNORMAL HIGH (ref 0–149)

## 2017-10-15 LAB — CMP14+EGFR
A/G RATIO: 2 (ref 1.2–2.2)
ALK PHOS: 55 IU/L (ref 39–117)
ALT: 27 IU/L (ref 0–44)
AST: 41 IU/L — ABNORMAL HIGH (ref 0–40)
Albumin: 4.5 g/dL (ref 3.6–4.8)
BILIRUBIN TOTAL: 0.5 mg/dL (ref 0.0–1.2)
BUN/Creatinine Ratio: 15 (ref 10–24)
BUN: 12 mg/dL (ref 8–27)
CHLORIDE: 100 mmol/L (ref 96–106)
CO2: 24 mmol/L (ref 20–29)
Calcium: 9.7 mg/dL (ref 8.6–10.2)
Creatinine, Ser: 0.78 mg/dL (ref 0.76–1.27)
GFR calc Af Amer: 111 mL/min/{1.73_m2} (ref >59)
GFR calc non Af Amer: 96 mL/min/{1.73_m2} (ref >59)
GLOBULIN, TOTAL: 2.2 g/dL (ref 1.5–4.5)
Glucose: 230 mg/dL — ABNORMAL HIGH (ref 65–99)
Potassium: 5.2 mmol/L (ref 3.5–5.2)
SODIUM: 143 mmol/L (ref 134–144)
Total Protein: 6.7 g/dL (ref 6.0–8.5)

## 2017-10-15 LAB — BAYER DCA HB A1C WAIVED: HB A1C (BAYER DCA - WAIVED): 9.4 % — ABNORMAL HIGH (ref <7.0)

## 2017-10-15 MED ORDER — ROSUVASTATIN CALCIUM 40 MG PO TABS: 20.0000 mg | ORAL_TABLET | Freq: Every day | ORAL | 5 refills | Status: DC

## 2017-10-15 MED ORDER — TRAMADOL HCL 50 MG PO TABS: 50.0000 mg | ORAL_TABLET | Freq: Two times a day (BID) | ORAL | 1 refills | Status: DC

## 2017-10-15 MED ORDER — GLIMEPIRIDE 4 MG PO TABS: 4.0000 mg | ORAL_TABLET | Freq: Two times a day (BID) | ORAL | 1 refills | Status: DC

## 2017-10-15 MED ORDER — AMLODIPINE BESYLATE 5 MG PO TABS: 5.0000 mg | ORAL_TABLET | Freq: Every day | ORAL | 1 refills | Status: DC

## 2017-10-15 MED ORDER — INSULIN GLARGINE 100 UNIT/ML SOLOSTAR PEN: 75.0000 [IU] | PEN_INJECTOR | Freq: Every day | SUBCUTANEOUS | 11 refills | Status: DC

## 2017-10-15 MED ORDER — METOPROLOL TARTRATE 25 MG PO TABS: 25.0000 mg | ORAL_TABLET | Freq: Two times a day (BID) | ORAL | 1 refills | Status: DC

## 2017-10-15 MED ORDER — SITAGLIPTIN PHOS-METFORMIN HCL 50-1000 MG PO TABS: 1.0000 | ORAL_TABLET | Freq: Two times a day (BID) | ORAL | 1 refills | Status: DC

## 2017-10-15 NOTE — Patient Instructions (Signed)
Shoulder Pain Many things can cause shoulder pain, including:  An injury.  Moving the arm in the same way again and again (overuse).  Joint pain (arthritis).  Follow these instructions at home: Take these actions to help with your pain:  Squeeze a soft ball or a foam pad as much as you can. This helps to prevent swelling. It also makes the arm stronger.  Take over-the-counter and prescription medicines only as told by your doctor.  If told, put ice on the area: ? Put ice in a plastic bag. ? Place a towel between your skin and the bag. ? Leave the ice on for 20 minutes, 2-3 times per day. Stop putting on ice if it does not help with the pain.  If you were given a shoulder sling or immobilizer: ? Wear it as told. ? Remove it to shower or bathe. ? Move your arm as little as possible. ? Keep your hand moving. This helps prevent swelling.  Contact a doctor if:  Your pain gets worse.  Medicine does not help your pain.  You have new pain in your arm, hand, or fingers. Get help right away if:  Your arm, hand, or fingers: ? Tingle. ? Are numb. ? Are swollen. ? Are painful. ? Turn white or blue. This information is not intended to replace advice given to you by your health care provider. Make sure you discuss any questions you have with your health care provider. Document Released: 10/28/2007 Document Revised: 01/05/2016 Document Reviewed: 09/03/2014 Elsevier Interactive Patient Education  2018 Elsevier Inc.  

## 2017-10-15 NOTE — Progress Notes (Signed)
Subjective:    Patient ID: Tyler Garner, male    DOB: 09/01/1955, 62 y.o.   MRN: [id number]   Chief Complaint: Medicam Management of Chronic Issues  HPI:  1. Essential hypertension  No c/o chest pain, sob or headache.  Does not check blood pressures at home. BP Readings from Last 3 Encounters:  08/17/17 (!) 150/85  07/16/17 (!) 141/76  06/15/17 (!) 155/81     2. Type 2 diabetes mellitus with hyperglycemia, without long-term current use of insulin (HCC) last HGBA1c was 10.0. He has not been compliant with diet since his wife died several years ago. E increased his lantus yet again at last visit and he is now up to 65u nightly. He really does not check blood sugars often when asked about blood sugars he says they are all over the place.  3. Morbid obesity ([place])  No recent weight changes  4. Pure hypercholesterolemia  Again not compliant with diet  5. Atherosclerosis of native coronary artery of native heart with angina pectoris with documented spasm [facility]) Patient has not seen cardiology in over a year. He has history ST elevation MI. He says he feels fine and just hasn't thought about it.    Outpatient Encounter Medications as of 10/15/2017  Medication Sig  . amLODipine (NORVASC) 5 MG tablet Take 1 tablet (5 mg total) by mouth daily.  Marland Kitchen amoxicillin-clavulanate (AUGMENTIN) 875-125 MG tablet Take 1 tablet by mouth 2 (two) times daily.  Marland Kitchen aspirin 81 MG tablet Take 81 mg by mouth daily.    . fluticasone (FLONASE) 50 MCG/ACT nasal spray Place 2 sprays into both nostrils daily.  Marland Kitchen glimepiride (AMARYL) 4 MG tablet Take 1 tablet (4 mg total) by mouth 2 (two) times daily.  Marland Kitchen glucose blood (ACCU-CHEK ACTIVE STRIPS) test strip Check blood sugar daily and prn  Dx E11.9  . Insulin Glargine (LANTUS SOLOSTAR) 100 UNIT/ML Solostar Pen Inject 60 Units into the skin daily at 10 pm.  . Insulin Pen Needle (PEN NEEDLES) 31G X 8 MM MISC Use with lantus solosar pen daily  . loratadine (CLARITIN) 10 MG  tablet Take 1 tablet (10 mg total) by mouth daily.  . meloxicam (MOBIC) 15 MG tablet TAKE 1 TABLET BY MOUTH EVERY DAY  . metoprolol tartrate (LOPRESSOR) 25 MG tablet Take 1 tablet (25 mg total) by mouth 2 (two) times daily.  . rosuvastatin (CRESTOR) 40 MG tablet Take 0.5 tablets (20 mg total) by mouth daily.  . sitaGLIPtin-metformin (JANUMET) 50-1000 MG tablet Take 1 tablet by mouth 2 (two) times daily with a meal.      New complaints: Still having bil knee pain and right shoulder pain. The diclofenac that he was given at last visit did not help  Social history: Son still lives with him. He has a [place] that he is enjoying. But at time she gets sad because he says his wife would have enjoyed this grand baby.   Review of Systems  Constitutional: Negative for activity change and appetite change.  HENT: Negative.   Eyes: Negative for pain.  Respiratory: Negative for shortness of breath.   Cardiovascular: Negative for chest pain, palpitations and leg swelling.  Gastrointestinal: Negative for abdominal pain.  Endocrine: Negative for polydipsia.  Genitourinary: Negative.   Skin: Negative for rash.  Neurological: Negative for dizziness, weakness and headaches.  Hematological: Does not bruise/bleed easily.  Psychiatric/Behavioral: Negative.   All other systems reviewed and are negative.      Objective:   Physical Exam  Constitutional: He is oriented to person, place, and time.  HENT:  Head: Normocephalic.  Nose: Nose normal.  Mouth/Throat: Oropharynx is clear and moist.  Eyes: Pupils are equal, round, and reactive to light. EOM are normal.  Neck: Normal range of motion and phonation normal. Neck supple. No JVD present. Carotid bruit is not present. No thyroid mass and no thyromegaly present.  Cardiovascular: Normal rate and regular rhythm.  Pulmonary/Chest: Effort normal and breath sounds normal. No respiratory distress.  Abdominal: Soft. Normal appearance, normal aorta and  bowel sounds are normal. There is no tenderness.  Musculoskeletal: Normal range of motion.  Lymphadenopathy:    He has no cervical adenopathy.  Neurological: He is alert and oriented to person, place, and time.  Skin: Skin is warm and dry.  Psychiatric: He has a normal mood and affect. His behavior is normal. Judgment and thought content normal.  Nursing note and vitals reviewed.   BP 137/84   Pulse 69   Temp (!) 97.5 F (36.4 C) (Oral)   Ht _0  (1.753 m)   Wt 231 lb (104.8 kg)   BMI 34.11 kg/m   hgba1c 9.4%- down from 10.0     Assessment & Plan:  Tyler Garner comes in today with chief complaint of Medical Management of Chronic Issues (arm and leg pain)   Diagnosis and orders addressed:  1. Essential hypertension Low sodium diet - CMP14+EGFR - amLODipine (NORVASC) 5 MG tablet; Take 1 tablet (5 mg total) by mouth daily.  Dispense: 90 tablet; Refill: 1  2. Type 2 diabetes mellitus with hyperglycemia, without long-term current use of insulin (HCC) Stricter carb counting Increase lantus to 70 u for 3 days then to 75 u nightly - Bayer DCA Hb A1c Waived - Microalbumin / creatinine urine ratio - glimepiride (AMARYL) 4 MG tablet; Take 1 tablet (4 mg total) by mouth 2 (two) times daily.  Dispense: 180 tablet; Refill: 1 - sitaGLIPtin-metformin (JANUMET) 50-1000 MG tablet; Take 1 tablet by mouth 2 (two) times daily with a meal.  Dispense: 180 tablet; Refill: 1 - Insulin Glargine (LANTUS SOLOSTAR) 100 UNIT/ML Solostar Pen; Inject 75 Units into the skin daily at 10 pm.  Dispense: 15 mL; Refill: 11  3. Morbid obesity ([place]) Discussed diet and exercise for person with BMI >25 Will recheck weight in 3-6 months  4. Pure hypercholesterolemiaMixed hyperlipidemia - rosuvastatin (CRESTOR) 40 MG tablet; Take 0.5 tablets (20 mg total) by mouth daily.  Dispense: 45 tablet; Refill: 5 Low fat diet - Lipid panel  5. Atherosclerosis of native coronary artery of native heart with angina  pectoris with documented spasm (HCC) - metoprolol tartrate (LOPRESSOR) 25 MG tablet; Take 1 tablet (25 mg total) by mouth 2 (two) times daily.  Dispense: 180 tablet; Refill: 1  6. Chronic right shoulder pain Referral to ortho - Ambulatory referral to Orthopedic Surgery - traMADol (ULTRAM) 50 MG tablet; Take 1 tablet (50 mg total) by mouth 2 (two) times daily.  Dispense: 60 tablet; Refill: 1   Labs pending Health Maintenance reviewed Diet and exercise encouraged  Follow up plan: 3 months   [name] Hassell Done, [name]

## 2017-10-27 ENCOUNTER — Ambulatory Visit (INDEPENDENT_AMBULATORY_CARE_PROVIDER_SITE_OTHER): Payer: Self-pay

## 2017-10-27 ENCOUNTER — Encounter (INDEPENDENT_AMBULATORY_CARE_PROVIDER_SITE_OTHER): Payer: Self-pay | Admitting: Orthopaedic Surgery

## 2017-10-27 ENCOUNTER — Ambulatory Visit (INDEPENDENT_AMBULATORY_CARE_PROVIDER_SITE_OTHER): Payer: PRIVATE HEALTH INSURANCE | Admitting: Orthopaedic Surgery

## 2017-10-27 VITALS — BP 172/91 | HR 81 | Ht 70.0 in | Wt 230.0 lb

## 2017-10-27 DIAGNOSIS — G8929 Other chronic pain: Secondary | ICD-10-CM | POA: Diagnosis not present

## 2017-10-27 DIAGNOSIS — M25511 Pain in right shoulder: Secondary | ICD-10-CM

## 2017-10-27 MED ORDER — BUPIVACAINE HCL 0.5 % IJ SOLN
2.0000 mL | INTRAMUSCULAR | Status: AC | PRN
  Administered 2017-10-27: 2 mL via INTRA_ARTICULAR

## 2017-10-27 MED ORDER — METHYLPREDNISOLONE ACETATE 40 MG/ML IJ SUSP
80.0000 mg | INTRAMUSCULAR | Status: AC | PRN
  Administered 2017-10-27: 80 mg

## 2017-10-27 MED ORDER — LIDOCAINE HCL 2 % IJ SOLN
2.0000 mL | INTRAMUSCULAR | Status: AC | PRN
  Administered 2017-10-27: 2 mL

## 2017-10-27 NOTE — Progress Notes (Signed)
Office Visit Note   Patient: Tyler Garner           Date of Birth: 01-02-1956           MRN: [id number] Visit Date: 10/27/2017              Requested by: Bennie Pierini, [name] [address] [place], [place] [address] PCP: Bennie Pierini, [name]   Assessment & Plan: Visit Diagnoses:  1. Chronic right shoulder pain     Plan: #1 impingement syndrome right shoulder.  Possible mild glenohumeral joint arthritis.  Will inject the subacromial space and glenohumeral joint and monitor response.  Not sure if the etiology is the subacromial space or the glenohumeral joint Follow-Up Instructions: Return if symptoms worsen or fail to improve.   Orders:  Orders Placed This Encounter  Procedures  . Large Joint Inj: R subacromial bursa  . XR Shoulder Right   No orders of the defined types were placed in this encounter.     Procedures: Large Joint Inj: R subacromial bursa on 10/27/2017 2:33 PM Indications: pain and diagnostic evaluation Details: 25 G 1.5 in needle, anterolateral approach  Arthrogram: No  Medications: 2 mL lidocaine 2 %; 2 mL bupivacaine 0.5 %; 80 mg methylPREDNISolone acetate 40 MG/ML Consent was given by the patient. Immediately prior to procedure a time out was called to verify the correct patient, procedure, equipment, support staff and site/side marked as required. Patient was prepped and draped in the usual sterile fashion.       Clinical Data: No additional findings.   Subjective: Chief Complaint  Patient presents with  . Right Shoulder - Pain  . New Patient (Initial Visit)    R SHOULDER PAIN  SEVERAL YEARS GETTING WORSE, NO INJURY  [name] relates that he has had some shoulder pain for a number of years.  He is not sure if he had any significant injury or trauma.  Sometimes he left difficulty raising his arm over his head.  Sometimes he has pain simply at rest.  He has not had any problem with his neck until just recently with some mild  discomfort but does not realize any referred pain to the shoulder.  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  Does do a lot of repetitive activities.  He has not had any skin changes.  No numbness or tingling but some referred pain as far as the proximal right forearm.  HPI  Review of Systems  Constitutional: Negative for fatigue and fever.  HENT: Negative for ear pain.   Eyes: Negative for pain.  Respiratory: Negative for cough and shortness of breath.   Cardiovascular: Negative for leg swelling.  Gastrointestinal: Negative for constipation and diarrhea.  Genitourinary: Negative for difficulty urinating.  Musculoskeletal: Positive for back pain and neck pain.  Skin: Negative for rash.  Allergic/Immunologic: Negative for food allergies.  Neurological: Positive for weakness and numbness.  Hematological: Does not bruise/bleed easily.  Psychiatric/Behavioral: Positive for sleep disturbance.     Objective: Vital Signs: BP (!) 172/91 (BP Location: Right Arm, Patient Position: Sitting, Cuff Size: Normal)   Pulse 81   Ht 5\' 10"  (1.778 m)   Wt 230 lb (104.3 kg)   BMI 33.00 kg/m   Physical Exam  Constitutional: He is oriented to person, place, and time. He appears well-developed and well-nourished.  HENT:  Mouth/Throat: Oropharynx is clear and moist.  Eyes: Pupils are equal, round, and reactive to light. EOM are normal.  Pulmonary/Chest: Effort normal.  Neurological: He is alert and oriented to person, place, and time.  Skin: Skin is warm and dry.  Psychiatric: He has a normal mood and affect. His behavior is normal.    Ortho Exam week alert and oriented x3.  Comfortable sitting.  Positive impingement on the extremes of external rotation right shoulder.  Able to quickly place his right arm over his head.   negative empty can testing.  Chin intact.  Biceps intact.  No loss of overhead motion.  No biceps  pain elbow pain or forearm discomfort. Specialty Comments:  No specialty comments available.  Imaging: Xr Shoulder Right  Result Date: 10/27/2017 The right shoulder obtained in several projections.  There is some sclerosis about the greater tuberosity.  No calcification between the acromium and the humeral head.  Humeral head is centered about the glenoid.  No obvious degenerative change of the glenohumeral joint.  There is a projection which I believe is the glenoid and not the humeral head spurring.  Normal space between the humeral head and the acromion.  Some degenerative change at the acromioclavicular joint    PMFS History: Patient Active Problem List   Diagnosis Date Noted  . Morbid obesity (HCC) 05/24/2015  . Arthritic-like pain 10/25/2014  . Essential hypertension 07/18/2014  . Diabetes (HCC) 07/18/2014  . Coronary atherosclerosis of native coronary artery 06/24/2013  . Acute right ventricular myocardial infarction (HCC) 06/23/2013  . ST elevation myocardial infarction (STEMI) of inferolateral wall (HCC) 06/21/2013  . Hyperlipidemia 08/27/2010   Past Medical History:  Diagnosis Date  . Arthritis   . CAD (coronary artery disease)    a. inferior STEMI (05/2013) with RV involvement- LHC:  dist CFX occluded (Promus premier (2.5x24 mm) DES); EF 40-45% with inf HK;  b. Echo (report pending) with EF 45-50%, lat WMA, RVE, mod RV hypokinesis   . Diabetes mellitus without complication (HCC)   . Hyperglycemia   . Hyperlipidemia   . Hypertension   . Ischemic cardiomyopathy   . Nicotine addiction   . Renal colic   . Right shoulder injury 10/11/09    Family History  Problem Relation Age of Onset  . Diabetes Mother   . ALS Mother   . Alzheimer's disease Father   . Diabetes Brother     Past Surgical History:  Procedure Laterality Date  .  heart stent    . LEFT HEART CATHETERIZATION WITH CORONARY ANGIOGRAM N/A 06/21/2013   Procedure: LEFT HEART CATHETERIZATION WITH CORONARY  ANGIOGRAM;  Surgeon: Cathryne Mancebo M [place], MD;  Location: [facility] CATH LAB;  Service: Cardiovascular;  Laterality: N/A;   Social History   Occupational History  . Not on file  Tobacco Use  . Smoking status: Former Games developer  . Smokeless tobacco: Former Engineer, water and Sexual Activity  . Alcohol use: Yes  . Drug use: No  . Sexual activity: Not on file

## 2018-01-21 ENCOUNTER — Encounter: Payer: Self-pay | Admitting: Nurse Practitioner

## 2018-01-21 ENCOUNTER — Ambulatory Visit (INDEPENDENT_AMBULATORY_CARE_PROVIDER_SITE_OTHER): Payer: Self-pay | Admitting: Nurse Practitioner

## 2018-01-21 VITALS — BP 149/84 | HR 73 | Temp 97.0°F | Ht 70.0 in | Wt 235.0 lb

## 2018-01-21 DIAGNOSIS — I10 Essential (primary) hypertension: Secondary | ICD-10-CM

## 2018-01-21 DIAGNOSIS — E7800 Pure hypercholesterolemia, unspecified: Secondary | ICD-10-CM

## 2018-01-21 DIAGNOSIS — I252 Old myocardial infarction: Secondary | ICD-10-CM

## 2018-01-21 DIAGNOSIS — E1169 Type 2 diabetes mellitus with other specified complication: Secondary | ICD-10-CM

## 2018-01-21 DIAGNOSIS — Z794 Long term (current) use of insulin: Secondary | ICD-10-CM

## 2018-01-21 LAB — LIPID PANEL
CHOLESTEROL TOTAL: 154 mg/dL (ref 100–199)
Chol/HDL Ratio: 7 ratio — ABNORMAL HIGH (ref 0.0–5.0)
HDL: 22 mg/dL — ABNORMAL LOW (ref >39)
TRIGLYCERIDES: 496 mg/dL — AB (ref 0–149)

## 2018-01-21 LAB — CMP14+EGFR
A/G RATIO: 1.8 (ref 1.2–2.2)
ALT: 35 IU/L (ref 0–44)
AST: 50 IU/L — ABNORMAL HIGH (ref 0–40)
Albumin: 4.8 g/dL (ref 3.6–4.8)
Alkaline Phosphatase: 62 IU/L (ref 39–117)
BUN/Creatinine Ratio: 14 (ref 10–24)
BUN: 12 mg/dL (ref 8–27)
Bilirubin Total: 0.4 mg/dL (ref 0.0–1.2)
CALCIUM: 9.8 mg/dL (ref 8.6–10.2)
CO2: 24 mmol/L (ref 20–29)
Chloride: 97 mmol/L (ref 96–106)
Creatinine, Ser: 0.83 mg/dL (ref 0.76–1.27)
GFR, EST AFRICAN AMERICAN: 108 mL/min/{1.73_m2} (ref >59)
GFR, EST NON AFRICAN AMERICAN: 94 mL/min/{1.73_m2} (ref >59)
GLOBULIN, TOTAL: 2.6 g/dL (ref 1.5–4.5)
Glucose: 253 mg/dL — ABNORMAL HIGH (ref 65–99)
POTASSIUM: 5.6 mmol/L — AB (ref 3.5–5.2)
Sodium: 138 mmol/L (ref 134–144)
TOTAL PROTEIN: 7.4 g/dL (ref 6.0–8.5)

## 2018-01-21 LAB — BAYER DCA HB A1C WAIVED: HB A1C: 11 % — AB (ref <7.0)

## 2018-01-21 MED ORDER — INSULIN GLARGINE 100 UNIT/ML SOLOSTAR PEN: 80.0000 [IU] | PEN_INJECTOR | Freq: Every day | SUBCUTANEOUS | 11 refills | Status: DC

## 2018-01-21 NOTE — Progress Notes (Signed)
Subjective:    Patient ID: Tyler Garner, male    DOB: 1956-04-04, 62 y.o.   MRN: [id number]    Chief Complaint: Medical management of chronic issues  HPI:  1. Essential hypertension  -Does not check BP -Has not seen Dr. Percival Spanish in 1 year (cards) -no C/O Cp/SOB/Dizziness   2. Type 2 diabetes mellitus with other specified complication, without long-term current use of insulin (HCC)  -does not check cbgs, if he does they [id number] -eats whatever he wants  3. Pure hypercholesterolemia  -takes his statin -eats whatever he wants   4. Morbid obesity ([place])  -doesn't exercise because it has been too hot  5. Hx of myocardial infarction  -No C/O chest pain/SOB -Has not seen Dr. Percival Spanish (cards)since 2017    Outpatient Encounter Medications as of 01/21/2018  Medication Sig  . amLODipine (NORVASC) 5 MG tablet Take 1 tablet (5 mg total) by mouth daily.  Marland Kitchen aspirin 81 MG tablet Take 81 mg by mouth daily.    Marland Kitchen glimepiride (AMARYL) 4 MG tablet Take 1 tablet (4 mg total) by mouth 2 (two) times daily.  Marland Kitchen glucose blood (ACCU-CHEK ACTIVE STRIPS) test strip Check blood sugar daily and prn  Dx E11.9  . Insulin Glargine (LANTUS SOLOSTAR) 100 UNIT/ML Solostar Pen Inject 75 Units into the skin daily at 10 pm.  . Insulin Pen Needle (PEN NEEDLES) 31G X 8 MM MISC Use with lantus solosar pen daily  . metoprolol tartrate (LOPRESSOR) 25 MG tablet Take 1 tablet (25 mg total) by mouth 2 (two) times daily.  . rosuvastatin (CRESTOR) 40 MG tablet Take 0.5 tablets (20 mg total) by mouth daily.  . sitaGLIPtin-metformin (JANUMET) 50-1000 MG tablet Take 1 tablet by mouth 2 (two) times daily with a meal.  . traMADol (ULTRAM) 50 MG tablet Take 1 tablet (50 mg total) by mouth 2 (two) times daily.   No facility-administered encounter medications on file as of 01/21/2018.       New complaints: No new complaints   Social history: Patient lives by himself with son, wife deceased; patient has 1  grandchild -patient deer and [place] hunts; season starts next week       Objective:   Physical Exam  Constitutional: He is oriented to person, place, and time. He appears well-developed and well-nourished.  HENT:  Head: Normocephalic and atraumatic.  Right Ear: External ear normal.  Left Ear: External ear normal.  Nose: Nose normal.  Mouth/Throat: Oropharynx is clear and moist.  Eyes: Pupils are equal, round, and reactive to light. Conjunctivae are normal.  Neck: Normal range of motion. Neck supple.  Cardiovascular: Normal rate, regular rhythm, normal heart sounds and intact distal pulses.  Pulmonary/Chest: Effort normal. He has wheezes (left  upper lobe).  Abdominal: Soft. Bowel sounds are normal.  Musculoskeletal: Normal range of motion.  Neurological: He is alert and oriented to person, place, and time.  Skin: Skin is warm and dry. Capillary refill takes 2 to 3 seconds.  Psychiatric: He has a normal mood and affect. His behavior is normal. Judgment and thought content normal.  Nursing note and vitals reviewed.  BP (!) 149/84   Pulse 73   Temp (!) 97 F (36.1 C) (Oral)   Ht 5' 10"  (1.778 m)   Wt 235 lb (106.6 kg)   BMI 33.72 kg/m    Hgba1c 11.0    Assessment & Plan:  Tyler Garner comes in today with chief complaint of Medical Management of Chronic Issues  Diagnosis and orders addressed:  1. Essential hypertension Low sodium diet - CMP14+EGFR  2. Type 2 diabetes mellitus with other specified complication, with long-term current use of insulin (HCC) Increase lnatus to 80 u daily Exercise discussed Diet discussed - Bayer DCA Hb A1c Waived - Microalbumin / creatinine urine ratio - Insulin Glargine (LANTUS SOLOSTAR) 100 UNIT/ML Solostar Pen; Inject 80 Units into the skin daily at 10 pm.  Dispense: 15 mL; Refill: 11  3. Pure hypercholesterolemia Low fat diet - Lipid panel  4. Morbid obesity ([place]) Discussed diet and exercise for person with BMI >25 Will  recheck weight in 3-6 months   5. Hx of myocardial infarction Follow up with cardiology as needed   Labs pending Health Maintenance reviewed Diet and exercise encouraged  Follow up plan: 1 month diabetes only   [name] Hassell Done, [name]

## 2018-01-21 NOTE — Addendum Note (Signed)
Addended by: Bennie Pierini on: 01/21/2018 02:58 PM   Modules accepted: Level of Service

## 2018-01-21 NOTE — Patient Instructions (Signed)
Diabetes Mellitus and Nutrition When you have diabetes (diabetes mellitus), it is very important to have healthy eating habits because your blood sugar (glucose) levels are greatly affected by what you eat and drink. Eating healthy foods in the appropriate amounts, at about the same times every day, can help you:  Control your blood glucose.  Lower your risk of heart disease.  Improve your blood pressure.  Reach or maintain a healthy weight.  Every person with diabetes is different, and each person has different needs for a meal plan. Your health care provider may recommend that you work with a diet and nutrition specialist (dietitian) to make a meal plan that is best for you. Your meal plan may vary depending on factors such as:  The calories you need.  The medicines you take.  Your weight.  Your blood glucose, blood pressure, and cholesterol levels.  Your activity level.  Other health conditions you have, such as heart or kidney disease.  How do carbohydrates affect me? Carbohydrates affect your blood glucose level more than any other type of food. Eating carbohydrates naturally increases the amount of glucose in your blood. Carbohydrate counting is a method for keeping track of how many carbohydrates you eat. Counting carbohydrates is important to keep your blood glucose at a healthy level, especially if you use insulin or take certain oral diabetes medicines. It is important to know how many carbohydrates you can safely have in each meal. This is different for every person. Your dietitian can help you calculate how many carbohydrates you should have at each meal and for snack. Foods that contain carbohydrates include:  Bread, cereal, rice, pasta, and crackers.  Potatoes and corn.  Peas, beans, and lentils.  Milk and yogurt.  Fruit and juice.  Desserts, such as cakes, cookies, ice cream, and candy.  How does alcohol affect me? Alcohol can cause a sudden decrease in blood  glucose (hypoglycemia), especially if you use insulin or take certain oral diabetes medicines. Hypoglycemia can be a life-threatening condition. Symptoms of hypoglycemia (sleepiness, dizziness, and confusion) are similar to symptoms of having too much alcohol. If your health care provider says that alcohol is safe for you, follow these guidelines:  Limit alcohol intake to no more than 1 drink per day for nonpregnant women and 2 drinks per day for men. One drink equals 12 oz of beer, 5 oz of wine, or 1 oz of hard liquor.  Do not drink on an empty stomach.  Keep yourself hydrated with water, diet soda, or unsweetened iced tea.  Keep in mind that regular soda, juice, and other mixers may contain a lot of sugar and must be counted as carbohydrates.  What are tips for following this plan? Reading food labels  Start by checking the serving size on the label. The amount of calories, carbohydrates, fats, and other nutrients listed on the label are based on one serving of the food. Many foods contain more than one serving per package.  Check the total grams (g) of carbohydrates in one serving. You can calculate the number of servings of carbohydrates in one serving by dividing the total carbohydrates by 15. For example, if a food has 30 g of total carbohydrates, it would be equal to 2 servings of carbohydrates.  Check the number of grams (g) of saturated and trans fats in one serving. Choose foods that have low or no amount of these fats.  Check the number of milligrams (mg) of sodium in one serving. Most people   should limit total sodium intake to less than 2,300 mg per day.  Always check the nutrition information of foods labeled as "low-fat" or "nonfat". These foods may be higher in added sugar or refined carbohydrates and should be avoided.  Talk to your dietitian to identify your daily goals for nutrients listed on the label. Shopping  Avoid buying canned, premade, or processed foods. These  foods tend to be high in fat, sodium, and added sugar.  Shop around the outside edge of the grocery store. This includes fresh fruits and vegetables, bulk grains, fresh meats, and fresh dairy. Cooking  Use low-heat cooking methods, such as baking, instead of high-heat cooking methods like deep frying.  Cook using healthy oils, such as olive, canola, or sunflower oil.  Avoid cooking with butter, cream, or high-fat meats. Meal planning  Eat meals and snacks regularly, preferably at the same times every day. Avoid going long periods of time without eating.  Eat foods high in fiber, such as fresh fruits, vegetables, beans, and whole grains. Talk to your dietitian about how many servings of carbohydrates you can eat at each meal.  Eat 4-6 ounces of lean protein each day, such as lean meat, chicken, fish, eggs, or tofu. 1 ounce is equal to 1 ounce of meat, chicken, or fish, 1 egg, or 1/4 cup of tofu.  Eat some foods each day that contain healthy fats, such as avocado, nuts, seeds, and fish. Lifestyle   Check your blood glucose regularly.  Exercise at least 30 minutes 5 or more days each week, or as told by your health care provider.  Take medicines as told by your health care provider.  Do not use any products that contain nicotine or tobacco, such as cigarettes and e-cigarettes. If you need help quitting, ask your health care provider.  Work with a counselor or diabetes educator to identify strategies to manage stress and any emotional and social challenges. What are some questions to ask my health care provider?  Do I need to meet with a diabetes educator?  Do I need to meet with a dietitian?  What number can I call if I have questions?  When are the best times to check my blood glucose? Where to find more information:  American Diabetes Association: diabetes.org/food-and-fitness/food  Academy of Nutrition and Dietetics:  www.eatright.org/resources/health/diseases-and-conditions/diabetes  National Institute of Diabetes and Digestive and Kidney Diseases (NIH): www.niddk.nih.gov/health-information/diabetes/overview/diet-eating-physical-activity Summary  A healthy meal plan will help you control your blood glucose and maintain a healthy lifestyle.  Working with a diet and nutrition specialist (dietitian) can help you make a meal plan that is best for you.  Keep in mind that carbohydrates and alcohol have immediate effects on your blood glucose levels. It is important to count carbohydrates and to use alcohol carefully. This information is not intended to replace advice given to you by your health care provider. Make sure you discuss any questions you have with your health care provider. Document Released: 02/05/2005 Document Revised: 06/15/2016 Document Reviewed: 06/15/2016 Elsevier Interactive Patient Education  2018 Elsevier Inc.  

## 2018-03-03 ENCOUNTER — Encounter: Payer: Self-pay | Admitting: Nurse Practitioner

## 2018-03-03 ENCOUNTER — Ambulatory Visit (INDEPENDENT_AMBULATORY_CARE_PROVIDER_SITE_OTHER): Payer: Self-pay | Admitting: Nurse Practitioner

## 2018-03-03 VITALS — BP 138/79 | HR 78 | Temp 97.0°F | Ht 70.0 in | Wt 233.0 lb

## 2018-03-03 DIAGNOSIS — E1165 Type 2 diabetes mellitus with hyperglycemia: Secondary | ICD-10-CM

## 2018-03-03 DIAGNOSIS — Z794 Long term (current) use of insulin: Secondary | ICD-10-CM

## 2018-03-03 DIAGNOSIS — E1169 Type 2 diabetes mellitus with other specified complication: Secondary | ICD-10-CM

## 2018-03-03 DIAGNOSIS — Z23 Encounter for immunization: Secondary | ICD-10-CM

## 2018-03-03 LAB — BAYER DCA HB A1C WAIVED: HB A1C: 9.8 % — AB (ref <7.0)

## 2018-03-03 MED ORDER — INSULIN DEGLUDEC 200 UNIT/ML ~~LOC~~ SOPN: 80.0000 [IU] | PEN_INJECTOR | Freq: Every day | SUBCUTANEOUS | 0 refills | Status: DC

## 2018-03-03 NOTE — Progress Notes (Signed)
   Subjective:    Patient ID: Tyler Garner, male    DOB: Jul 18, 1955, 62 y.o.   MRN: [id number]   Chief Complaint: Diabetes   HPI Patient comes in for follow up of diabetes. He was seen on 01/21/18 with hgba1c of 11.0. He has not been doing well with his diabetes since his wife died over 5 years ago. At last visit we increased his lantus to 80u daily. Enouraged diet and exercise. He has not been checking his blood sugars and not watching diet.   Review of Systems  Constitutional: Negative for activity change and appetite change.  HENT: Negative.   Eyes: Negative for pain.  Respiratory: Negative for shortness of breath.   Cardiovascular: Negative for chest pain, palpitations and leg swelling.  Gastrointestinal: Negative for abdominal pain.  Endocrine: Negative for polydipsia.  Genitourinary: Negative.   Skin: Negative for rash.  Neurological: Negative for dizziness, weakness and headaches.  Hematological: Does not bruise/bleed easily.  Psychiatric/Behavioral: Negative.   All other systems reviewed and are negative.      Objective:   Physical Exam  Constitutional: He is oriented to person, place, and time. He appears well-developed and well-nourished.  HENT:  Head: Normocephalic.  Nose: Nose normal.  Mouth/Throat: Oropharynx is clear and moist.  Eyes: Pupils are equal, round, and reactive to light. EOM are normal.  Neck: Normal range of motion and phonation normal. Neck supple. No JVD present. Carotid bruit is not present. No thyroid mass and no thyromegaly present.  Cardiovascular: Normal rate and regular rhythm.  Pulmonary/Chest: Effort normal and breath sounds normal. No respiratory distress.  Abdominal: Soft. Normal appearance, normal aorta and bowel sounds are normal. There is no tenderness.  Musculoskeletal: Normal range of motion.  Lymphadenopathy:    He has no cervical adenopathy.  Neurological: He is alert and oriented to person, place, and time.  Skin: Skin is  warm and dry.  Psychiatric: He has a normal mood and affect. His behavior is normal. Judgment and thought content normal.  Nursing note and vitals reviewed.  BP 138/79   Pulse 78   Temp (!) 97 F (36.1 C) (Oral)   Ht 5\' 10"  (1.778 m)   Wt 233 lb (105.7 kg)   BMI 33.43 kg/m   hgba1c 9.8%    Assessment & Plan:  Tyler Garner in today with chief complaint of Diabetes   1. Type 2 diabetes mellitus with hyperglycemia, without long-term current use of insulin (HCC) Patient has no insurance now and is about to run out of lantus. I am switching him to tresiba right now with 80u daily Blood sugar checks encouraged folow up in 2 months - Bayer [facility] Hb A1c [facility], [name]

## 2018-05-03 ENCOUNTER — Encounter: Payer: Self-pay | Admitting: Nurse Practitioner

## 2018-05-03 ENCOUNTER — Ambulatory Visit (INDEPENDENT_AMBULATORY_CARE_PROVIDER_SITE_OTHER): Payer: No Typology Code available for payment source | Admitting: Nurse Practitioner

## 2018-05-03 VITALS — BP 137/84 | HR 73 | Temp 97.2°F | Ht 70.0 in | Wt 235.0 lb

## 2018-05-03 DIAGNOSIS — M25511 Pain in right shoulder: Secondary | ICD-10-CM

## 2018-05-03 DIAGNOSIS — G8929 Other chronic pain: Secondary | ICD-10-CM

## 2018-05-03 DIAGNOSIS — E1165 Type 2 diabetes mellitus with hyperglycemia: Secondary | ICD-10-CM

## 2018-05-03 DIAGNOSIS — I25111 Atherosclerotic heart disease of native coronary artery with angina pectoris with documented spasm: Secondary | ICD-10-CM

## 2018-05-03 DIAGNOSIS — I219 Acute myocardial infarction, unspecified: Secondary | ICD-10-CM

## 2018-05-03 DIAGNOSIS — E782 Mixed hyperlipidemia: Secondary | ICD-10-CM

## 2018-05-03 DIAGNOSIS — E7800 Pure hypercholesterolemia, unspecified: Secondary | ICD-10-CM

## 2018-05-03 DIAGNOSIS — I10 Essential (primary) hypertension: Secondary | ICD-10-CM | POA: Diagnosis not present

## 2018-05-03 DIAGNOSIS — Z79899 Other long term (current) drug therapy: Secondary | ICD-10-CM

## 2018-05-03 LAB — CMP14+EGFR
ALBUMIN: 4.4 g/dL (ref 3.6–4.8)
ALK PHOS: 57 IU/L (ref 39–117)
ALT: 31 IU/L (ref 0–44)
AST: 45 IU/L — AB (ref 0–40)
Albumin/Globulin Ratio: 1.8 (ref 1.2–2.2)
BUN/Creatinine Ratio: 13 (ref 10–24)
BUN: 11 mg/dL (ref 8–27)
Bilirubin Total: 0.4 mg/dL (ref 0.0–1.2)
CHLORIDE: 101 mmol/L (ref 96–106)
CO2: 26 mmol/L (ref 20–29)
CREATININE: 0.84 mg/dL (ref 0.76–1.27)
Calcium: 9.8 mg/dL (ref 8.6–10.2)
GFR calc Af Amer: 108 mL/min/{1.73_m2} (ref >59)
GFR calc non Af Amer: 93 mL/min/{1.73_m2} (ref >59)
GLUCOSE: 252 mg/dL — AB (ref 65–99)
Globulin, Total: 2.4 g/dL (ref 1.5–4.5)
POTASSIUM: 5.7 mmol/L — AB (ref 3.5–5.2)
Sodium: 144 mmol/L (ref 134–144)
Total Protein: 6.8 g/dL (ref 6.0–8.5)

## 2018-05-03 LAB — LIPID PANEL
CHOL/HDL RATIO: 7 ratio — AB (ref 0.0–5.0)
Cholesterol, Total: 168 mg/dL (ref 100–199)
HDL: 24 mg/dL — AB (ref >39)
Triglycerides: 529 mg/dL — ABNORMAL HIGH (ref 0–149)

## 2018-05-03 LAB — BAYER DCA HB A1C WAIVED: HB A1C (BAYER DCA - WAIVED): 10.9 % — ABNORMAL HIGH (ref <7.0)

## 2018-05-03 MED ORDER — ROSUVASTATIN CALCIUM 40 MG PO TABS: 20.0000 mg | ORAL_TABLET | Freq: Every day | ORAL | 5 refills | Status: DC

## 2018-05-03 MED ORDER — GLIMEPIRIDE 4 MG PO TABS: 4.0000 mg | ORAL_TABLET | Freq: Two times a day (BID) | ORAL | 1 refills | Status: DC

## 2018-05-03 MED ORDER — METOPROLOL TARTRATE 25 MG PO TABS: 25.0000 mg | ORAL_TABLET | Freq: Two times a day (BID) | ORAL | 1 refills | Status: DC

## 2018-05-03 MED ORDER — AMLODIPINE BESYLATE 5 MG PO TABS: 5.0000 mg | ORAL_TABLET | Freq: Every day | ORAL | 1 refills | Status: DC

## 2018-05-03 MED ORDER — TRAMADOL HCL 50 MG PO TABS: 50.0000 mg | ORAL_TABLET | Freq: Two times a day (BID) | ORAL | 1 refills | Status: DC

## 2018-05-03 MED ORDER — SITAGLIPTIN PHOS-METFORMIN HCL 50-1000 MG PO TABS: 1.0000 | ORAL_TABLET | Freq: Two times a day (BID) | ORAL | 1 refills | Status: DC

## 2018-05-03 NOTE — Progress Notes (Signed)
Patient ID: Tyler Garner, male   DOB: 01-02-1956, 62 y.o.   MRN: [id number] Patient ID: Tyler Garner, male    DOB: March 20, 1956, 62 y.o.   MRN: [id number]   Chief Complaint: Medical Management of Chronic Issues   HPI:  1. Type 2 diabetes mellitus with hyperglycemia, without long-term current use of insulin (HCC) last hgba1c was 9.8. It had come down from previous. He has not been checking blood sugars and has not been watching diet.  2. Essential hypertension  No c/o chest pain ,sob or headache. Does not check blood pressure at home. BP Readings from Last 3 Encounters:  05/03/18 137/84  03/03/18 138/79  01/21/18 (!) 149/84     3. Pure hypercholesterolemia  Not watching diet or exercising  4. Acute right ventricular myocardial infarction [facility])  Has not seen cardiology since 2017  5. Atherosclerosis of native coronary artery of native heart with angina pectoris with documented spasm [facility]) again has not seen cardiology since 2017.  6. Morbid obesity ([place])  No weight changes    Outpatient Encounter Medications as of 05/03/2018  Medication Sig  . amLODipine (NORVASC) 5 MG tablet Take 1 tablet (5 mg total) by mouth daily.  Marland Kitchen aspirin 81 MG tablet Take 81 mg by mouth daily.    Marland Kitchen glimepiride (AMARYL) 4 MG tablet Take 1 tablet (4 mg total) by mouth 2 (two) times daily.  Marland Kitchen glucose blood (ACCU-CHEK ACTIVE STRIPS) test strip Check blood sugar daily and prn  Dx E11.9  . Insulin Degludec (TRESIBA FLEXTOUCH) 200 UNIT/ML SOPN Inject 80 Units into the skin daily.  . Insulin Pen Needle (PEN NEEDLES) 31G X 8 MM MISC Use with lantus solosar pen daily  . metoprolol tartrate (LOPRESSOR) 25 MG tablet Take 1 tablet (25 mg total) by mouth 2 (two) times daily.  . rosuvastatin (CRESTOR) 40 MG tablet Take 0.5 tablets (20 mg total) by mouth daily.  . sitaGLIPtin-metformin (JANUMET) 50-1000 MG tablet Take 1 tablet by mouth 2 (two) times daily with a meal.  . traMADol (ULTRAM) 50 MG tablet Take 1 tablet (50 mg  total) by mouth 2 (two) times daily. (Patient not taking: Reported on 05/03/2018)      New complaints: Having low back pain and he uses ultram prn for pain. Rates pain 5/10 . Pain radiate sdown left leg at times. Rest helps pain, activity increases pain.  Social history: Has not taking good care of himself since his wife died several years ago   Review of Systems  Constitutional: Negative for activity change and appetite change.  HENT: Negative.   Eyes: Negative for pain.  Respiratory: Negative for shortness of breath.   Cardiovascular: Negative for chest pain, palpitations and leg swelling.  Gastrointestinal: Negative for abdominal pain.  Endocrine: Negative for polydipsia.  Genitourinary: Negative.   Skin: Negative for rash.  Neurological: Negative for dizziness, weakness and headaches.  Hematological: Does not bruise/bleed easily.  Psychiatric/Behavioral: Negative.   All other systems reviewed and are negative.      Objective:   Physical Exam  Constitutional: He is oriented to person, place, and time. He appears well-developed and well-nourished.  HENT:  Head: Normocephalic.  Nose: Nose normal.  Mouth/Throat: Oropharynx is clear and moist.  Eyes: Pupils are equal, round, and reactive to light. EOM are normal.  Neck: Normal range of motion and phonation normal. Neck supple. No JVD present. Carotid bruit is not present. No thyroid mass and no thyromegaly present.  Cardiovascular: Normal rate and regular rhythm.  Pulmonary/Chest: Effort normal and breath sounds normal. No respiratory distress.  Abdominal: Soft. Normal appearance, normal aorta and bowel sounds are normal. There is no tenderness.  Musculoskeletal: Normal range of motion.  Lymphadenopathy:    He has no cervical adenopathy.  Neurological: He is alert and oriented to person, place, and time.  Skin: Skin is warm and dry.  Psychiatric: He has a normal mood and affect. His behavior is normal. Judgment and  thought content normal.  Nursing note and vitals reviewed.  BP 137/84   Pulse 73   Temp (!) 97.2 F (36.2 C) (Oral)   Ht 5' 10" (1.778 m)   Wt 235 lb (106.6 kg)   BMI 33.72 kg/m   hgba1c 10.9      Assessment & Plan:  ARLEY SALAMONE comes in today with chief complaint of Medical Management of Chronic Issues   Diagnosis and orders addressed:  1. Type 2 diabetes mellitus with hyperglycemia, without long-term current use of insulin (HCC) Stricter carb counting- he does  Not want to add meal time insulin so will give him 1 month to try to get down. - Microalbumin / creatinine urine ratio - Bayer DCA Hb A1c Waived - glimepiride (AMARYL) 4 MG tablet; Take 1 tablet (4 mg total) by mouth 2 (two) times daily.  Dispense: 180 tablet; Refill: 1 - sitaGLIPtin-metformin (JANUMET) 50-1000 MG tablet; Take 1 tablet by mouth 2 (two) times daily with a meal.  Dispense: 180 tablet; Refill: 1  2. Essential hypertension Low sodium diet - CMP14+EGFR - amLODipine (NORVASC) 5 MG tablet; Take 1 tablet (5 mg total) by mouth daily.  Dispense: 90 tablet; Refill: 1  3. Pure hypercholesterolemia Low fat diet - Lipid panel  4. Acute right ventricular myocardial infarction [facility]) Needs to see cardiology for follow up  5. Atherosclerosis of native coronary artery of native heart with angina pectoris with documented spasm (HCC) - metoprolol tartrate (LOPRESSOR) 25 MG tablet; Take 1 tablet (25 mg total) by mouth 2 (two) times daily.  Dispense: 180 tablet; Refill: 1  6. Morbid obesity ([place]) Discussed diet and exercise for person with BMI >25 Will recheck weight in 3-6 months  7. Chronic right shoulder pain Back exercises - traMADol (ULTRAM) 50 MG tablet; Take 1 tablet (50 mg total) by mouth 2 (two) times daily.  Dispense: 60 tablet; Refill: 1  8. Controlled substance agreement signed Signed today  9. Mixed hyperlipidemia Low fat diet - rosuvastatin (CRESTOR) 40 MG tablet; Take 0.5 tablets (20 mg  total) by mouth daily.  Dispense: 45 tablet; Refill: 5   Labs pending Health Maintenance reviewed Diet and exercise encouraged  Follow up plan: 1 month diabetes check   [name] Hassell Done, [name]

## 2018-05-03 NOTE — Patient Instructions (Signed)

## 2018-05-09 ENCOUNTER — Other Ambulatory Visit: Payer: Self-pay | Admitting: *Deleted

## 2018-05-09 DIAGNOSIS — E875 Hyperkalemia: Secondary | ICD-10-CM

## 2018-06-13 ENCOUNTER — Encounter: Payer: Self-pay | Admitting: Nurse Practitioner

## 2018-06-13 ENCOUNTER — Ambulatory Visit (INDEPENDENT_AMBULATORY_CARE_PROVIDER_SITE_OTHER): Payer: No Typology Code available for payment source | Admitting: Nurse Practitioner

## 2018-06-13 VITALS — BP 157/92 | HR 74 | Temp 97.4°F | Ht 70.0 in | Wt 230.0 lb

## 2018-06-13 DIAGNOSIS — E1165 Type 2 diabetes mellitus with hyperglycemia: Secondary | ICD-10-CM | POA: Diagnosis not present

## 2018-06-13 LAB — BAYER DCA HB A1C WAIVED: HB A1C (BAYER DCA - WAIVED): 9.2 % — ABNORMAL HIGH (ref <7.0)

## 2018-06-13 NOTE — Patient Instructions (Signed)
Diabetes Mellitus and Exercise Exercising regularly is important for your overall health, especially when you have diabetes (diabetes mellitus). Exercising is not only about losing weight. It has many other health benefits, such as increasing muscle strength and bone density and reducing body fat and stress. This leads to improved fitness, flexibility, and endurance, all of which result in better overall health. Exercise has additional benefits for people with diabetes, including:  Reducing appetite.  Helping to lower and control blood glucose.  Lowering blood pressure.  Helping to control amounts of fatty substances (lipids) in the blood, such as cholesterol and triglycerides.  Helping the body to respond better to insulin (improving insulin sensitivity).  Reducing how much insulin the body needs.  Decreasing the risk for heart disease by: ? Lowering cholesterol and triglyceride levels. ? Increasing the levels of good cholesterol. ? Lowering blood glucose levels. What is my activity plan? Your health care provider or certified diabetes educator can help you make a plan for the type and frequency of exercise (activity plan) that works for you. Make sure that you:  Do at least 150 minutes of moderate-intensity or vigorous-intensity exercise each week. This could be brisk walking, biking, or water aerobics. ? Do stretching and strength exercises, such as yoga or weightlifting, at least 2 times a week. ? Spread out your activity over at least 3 days of the week.  Get some form of physical activity every day. ? Do not go more than 2 days in a row without some kind of physical activity. ? Avoid being inactive for more than 30 minutes at a time. Take frequent breaks to walk or stretch.  Choose a type of exercise or activity that you enjoy, and set realistic goals.  Start slowly, and gradually increase the intensity of your exercise over time. What do I need to know about managing my  diabetes?   Check your blood glucose before and after exercising. ? If your blood glucose is 240 mg/dL (13.3 mmol/L) or higher before you exercise, check your urine for ketones. If you have ketones in your urine, do not exercise until your blood glucose returns to normal. ? If your blood glucose is 100 mg/dL (5.6 mmol/L) or lower, eat a snack containing 15-20 grams of carbohydrate. Check your blood glucose 15 minutes after the snack to make sure that your level is above 100 mg/dL (5.6 mmol/L) before you start your exercise.  Know the symptoms of low blood glucose (hypoglycemia) and how to treat it. Your risk for hypoglycemia increases during and after exercise. Common symptoms of hypoglycemia can include: ? Hunger. ? Anxiety. ? Sweating and feeling clammy. ? Confusion. ? Dizziness or feeling light-headed. ? Increased heart rate or palpitations. ? Blurry vision. ? Tingling or numbness around the mouth, lips, or tongue. ? Tremors or shakes. ? Irritability.  Keep a rapid-acting carbohydrate snack available before, during, and after exercise to help prevent or treat hypoglycemia.  Avoid injecting insulin into areas of the body that are going to be exercised. For example, avoid injecting insulin into: ? The arms, when playing tennis. ? The legs, when jogging.  Keep records of your exercise habits. Doing this can help you and your health care provider adjust your diabetes management plan as needed. Write down: ? Food that you eat before and after you exercise. ? Blood glucose levels before and after you exercise. ? The type and amount of exercise you have done. ? When your insulin is expected to peak, if you use   insulin. Avoid exercising at times when your insulin is peaking.  When you start a new exercise or activity, work with your health care provider to make sure the activity is safe for you, and to adjust your insulin, medicines, or food intake as needed.  Drink plenty of water while  you exercise to prevent dehydration or heat stroke. Drink enough fluid to keep your urine clear or pale yellow. Summary  Exercising regularly is important for your overall health, especially when you have diabetes (diabetes mellitus).  Exercising has many health benefits, such as increasing muscle strength and bone density and reducing body fat and stress.  Your health care provider or certified diabetes educator can help you make a plan for the type and frequency of exercise (activity plan) that works for you.  When you start a new exercise or activity, work with your health care provider to make sure the activity is safe for you, and to adjust your insulin, medicines, or food intake as needed. This information is not intended to replace advice given to you by your health care provider. Make sure you discuss any questions you have with your health care provider. Document Released: 08/01/2003 Document Revised: 11/19/2016 Document Reviewed: 10/21/2015 Elsevier Interactive Patient Education  2019 [employer] Inc[employer]  

## 2018-06-13 NOTE — Progress Notes (Signed)
   Subjective:    Patient ID: Tyler Garner, male    DOB: [birth year], 63 y.o.   MRN: [id number]   Chief Complaint: Diabetes (one month follow up)   HPI Patient returns to the office today for follow up of diabetes. His wife died several years ago from diabetes complication and every since then his diabetes has been out if control. At last visit his hgba1c was 10.9 which was up from previous month of 9.8. he still does not check his blood sugars. He has changed diet and has stopped eating bread and stopped eating snacks. He has actually lost 5lbs.   Review of Systems  Constitutional: Negative.   Respiratory: Negative.   Cardiovascular: Negative.   Neurological: Negative.   Psychiatric/Behavioral: Negative.   All other systems reviewed and are negative.      Objective:   Physical Exam Constitutional:      General: He is not in acute distress.    Appearance: He is obese.  Cardiovascular:     Rate and Rhythm: Normal rate and regular rhythm.     Pulses: Normal pulses.     Heart sounds: Normal heart sounds.  Skin:    General: Skin is warm and dry.  Neurological:     General: No focal deficit present.     Mental Status: He is alert and oriented to person, place, and time.  Psychiatric:        Mood and Affect: Mood normal.        Behavior: Behavior normal.    BP (!) 157/92   Pulse 74   Temp (!) 97.4 F (36.3 C) (Oral)   Ht 5\' 10"  (1.778 m)   Wt 230 lb (104.3 kg)   BMI 33.00 kg/m   hgba1c 9.2%       Assessment & Plan:  Tyler Garner in today with chief complaint of Diabetes (one month follow up)   1. Uncontrolled type 2 diabetes mellitus with hyperglycemia (HCC) Continue with current diet Add exercise RTO in 3 months for recheck - Bayer DCA Hb A1c Waived  [name] Daphine Deutscher, [name]

## 2018-09-28 ENCOUNTER — Other Ambulatory Visit: Payer: Self-pay

## 2018-09-29 ENCOUNTER — Encounter: Payer: Self-pay | Admitting: Nurse Practitioner

## 2018-09-29 ENCOUNTER — Ambulatory Visit (INDEPENDENT_AMBULATORY_CARE_PROVIDER_SITE_OTHER): Payer: Self-pay | Admitting: Nurse Practitioner

## 2018-09-29 VITALS — BP 136/79 | HR 79 | Temp 97.3°F | Ht 70.0 in | Wt 232.0 lb

## 2018-09-29 DIAGNOSIS — G8929 Other chronic pain: Secondary | ICD-10-CM

## 2018-09-29 DIAGNOSIS — M25511 Pain in right shoulder: Secondary | ICD-10-CM

## 2018-09-29 DIAGNOSIS — E1165 Type 2 diabetes mellitus with hyperglycemia: Secondary | ICD-10-CM

## 2018-09-29 DIAGNOSIS — I25111 Atherosclerotic heart disease of native coronary artery with angina pectoris with documented spasm: Secondary | ICD-10-CM

## 2018-09-29 DIAGNOSIS — I252 Old myocardial infarction: Secondary | ICD-10-CM

## 2018-09-29 DIAGNOSIS — E7800 Pure hypercholesterolemia, unspecified: Secondary | ICD-10-CM

## 2018-09-29 DIAGNOSIS — E782 Mixed hyperlipidemia: Secondary | ICD-10-CM

## 2018-09-29 DIAGNOSIS — I10 Essential (primary) hypertension: Secondary | ICD-10-CM

## 2018-09-29 LAB — CMP14+EGFR
ALT: 29 IU/L (ref 0–44)
AST: 35 IU/L (ref 0–40)
Albumin/Globulin Ratio: 1.8 (ref 1.2–2.2)
Albumin: 4.8 g/dL (ref 3.8–4.8)
Alkaline Phosphatase: 60 IU/L (ref 39–117)
BUN/Creatinine Ratio: 18 (ref 10–24)
BUN: 13 mg/dL (ref 8–27)
Bilirubin Total: 0.4 mg/dL (ref 0.0–1.2)
CO2: 26 mmol/L (ref 20–29)
Calcium: 10 mg/dL (ref 8.6–10.2)
Chloride: 97 mmol/L (ref 96–106)
Creatinine, Ser: 0.73 mg/dL — ABNORMAL LOW (ref 0.76–1.27)
GFR calc Af Amer: 113 mL/min/{1.73_m2} (ref >59)
GFR calc non Af Amer: 98 mL/min/{1.73_m2} (ref >59)
Globulin, Total: 2.6 g/dL (ref 1.5–4.5)
Glucose: 269 mg/dL — ABNORMAL HIGH (ref 65–99)
Potassium: 5 mmol/L (ref 3.5–5.2)
Sodium: 139 mmol/L (ref 134–144)
Total Protein: 7.4 g/dL (ref 6.0–8.5)

## 2018-09-29 LAB — LIPID PANEL
Chol/HDL Ratio: 7.4 ratio — ABNORMAL HIGH (ref 0.0–5.0)
Cholesterol, Total: 163 mg/dL (ref 100–199)
HDL: 22 mg/dL — ABNORMAL LOW (ref >39)
Triglycerides: 598 mg/dL (ref 0–149)

## 2018-09-29 LAB — BAYER DCA HB A1C WAIVED: HB A1C (BAYER DCA - WAIVED): 9.7 % — ABNORMAL HIGH (ref <7.0)

## 2018-09-29 MED ORDER — ROSUVASTATIN CALCIUM 40 MG PO TABS: 20.0000 mg | ORAL_TABLET | Freq: Every day | ORAL | 5 refills | Status: DC

## 2018-09-29 MED ORDER — INSULIN GLARGINE 100 UNIT/ML SOLOSTAR PEN: 80.0000 [IU] | PEN_INJECTOR | Freq: Every day | SUBCUTANEOUS | 99 refills | Status: DC

## 2018-09-29 MED ORDER — GLIMEPIRIDE 4 MG PO TABS: 4.0000 mg | ORAL_TABLET | Freq: Two times a day (BID) | ORAL | 1 refills | Status: DC

## 2018-09-29 MED ORDER — AMLODIPINE BESYLATE 5 MG PO TABS: 5.0000 mg | ORAL_TABLET | Freq: Every day | ORAL | 1 refills | Status: DC

## 2018-09-29 MED ORDER — METOPROLOL TARTRATE 25 MG PO TABS: 25.0000 mg | ORAL_TABLET | Freq: Two times a day (BID) | ORAL | 1 refills | Status: DC

## 2018-09-29 MED ORDER — TRAMADOL HCL 50 MG PO TABS: 50.0000 mg | ORAL_TABLET | Freq: Two times a day (BID) | ORAL | 1 refills | Status: DC

## 2018-09-29 NOTE — Progress Notes (Signed)
Subjective:    Patient ID: Tyler Garner, male    DOB: 1955-11-27, 63 y.o.   MRN: [id number]   Chief Complaint: medical management of chronic issues   HPI:  1. Essential hypertension No c/o chest pain, sob ir headache. Does not check blood pressure at home. BP Readings from Last 3 Encounters:  06/13/18 (!) 157/92  05/03/18 137/84  03/03/18 138/79     2. Pure hypercholesterolemia Does not watch diet and does no exercise  3. Uncontrolled type 2 diabetes mellitus with hyperglycemia (HCC) Last hgab1c was 10.9. he was encouraged to watch diet and check blood sugars daily and keep in a diary. Continue all meds and make sure that do not miss any meds. He has not been folowing plan and checking blood sugars. He has been trying to watch what he eats.  4. Hx of myocardial infarction He has been doing well the last year. No chest pain.has not seen cardiology since 2017. He does not feel need to go at this point.  5. Morbid obesity ([place]) No recent weight changes.    Outpatient Encounter Medications as of 09/29/2018  Medication Sig  . amLODipine (NORVASC) 5 MG tablet Take 1 tablet (5 mg total) by mouth daily.  Marland Kitchen aspirin 81 MG tablet Take 81 mg by mouth daily.    Marland Kitchen glimepiride (AMARYL) 4 MG tablet Take 1 tablet (4 mg total) by mouth 2 (two) times daily.  Marland Kitchen glucose blood (ACCU-CHEK ACTIVE STRIPS) test strip Check blood sugar daily and prn  Dx E11.9  . Insulin Pen Needle (PEN NEEDLES) 31G X 8 MM MISC Use with lantus solosar pen daily  . metoprolol tartrate (LOPRESSOR) 25 MG tablet Take 1 tablet (25 mg total) by mouth 2 (two) times daily.  . rosuvastatin (CRESTOR) 40 MG tablet Take 0.5 tablets (20 mg total) by mouth daily.  . sitaGLIPtin-metformin (JANUMET) 50-1000 MG tablet Take 1 tablet by mouth 2 (two) times daily with a meal.  . traMADol (ULTRAM) 50 MG tablet Take 1 tablet (50 mg total) by mouth 2 (two) times daily.      New complaints: None today  Social history: Son still  lives with him. Has a granddaughter that he sees as often as he can.     Review of Systems  Constitutional: Negative for activity change and appetite change.  HENT: Negative.   Eyes: Negative for pain.  Respiratory: Negative for shortness of breath.   Cardiovascular: Negative for chest pain, palpitations and leg swelling.  Gastrointestinal: Negative for abdominal pain.  Endocrine: Negative for polydipsia.  Genitourinary: Negative.   Skin: Negative for rash.  Neurological: Negative for dizziness, weakness and headaches.  Hematological: Does not bruise/bleed easily.  Psychiatric/Behavioral: Negative.   All other systems reviewed and are negative.      Objective:   Physical Exam Vitals signs and nursing note reviewed.  Constitutional:      Appearance: Normal appearance. He is well-developed.  HENT:     Head: Normocephalic.     Nose: Nose normal.  Eyes:     Pupils: Pupils are equal, round, and reactive to light.  Neck:     Musculoskeletal: Normal range of motion and neck supple.     Thyroid: No thyroid mass or thyromegaly.     Vascular: No carotid bruit or JVD.     Trachea: Phonation normal.  Cardiovascular:     Rate and Rhythm: Normal rate and regular rhythm.  Pulmonary:     Effort: Pulmonary effort is normal. No  respiratory distress.     Breath sounds: Normal breath sounds.  Abdominal:     General: Bowel sounds are normal.     Palpations: Abdomen is soft.     Tenderness: There is no abdominal tenderness.  Musculoskeletal: Normal range of motion.  Lymphadenopathy:     Cervical: No cervical adenopathy.  Skin:    General: Skin is warm and dry.  Neurological:     Mental Status: He is alert and oriented to person, place, and time.  Psychiatric:        Behavior: Behavior normal.        Thought Content: Thought content normal.        Judgment: Judgment normal.    BP 136/79   Pulse 79   Temp (!) 97.3 F (36.3 C) (Oral)   Ht 5' 10"  (1.778 m)   Wt 232 lb (105.2 kg)    BMI 33.29 kg/m   HGBA1c 9.7%       Assessment & Plan:  ZACCHEAUS STORLIE comes in today with chief complaint of Medical Management of Chronic Issues   Diagnosis and orders addressed:  1. Essential hypertension Low sodium diet - CMP14+EGFR - amLODipine (NORVASC) 5 MG tablet; Take 1 tablet (5 mg total) by mouth daily.  Dispense: 90 tablet; Refill: 1  2. Pure hypercholesterolemia Low fat diet - Lipid panel - rosuvastatin (CRESTOR) 40 MG tablet; Take 0.5 tablets (20 mg total) by mouth daily.  Dispense: 45 tablet; Refill: 5  3. Uncontrolled type 2 diabetes mellitus with hyperglycemia ([place]) Continue to watch carbs in diet He doesnot want to do insulin pump.  Encouraged to watch diet *was unable to void today to check  microalbumin - Bayer DCA Hb A1c Waived - Microalbumin / creatinine urine ratio - glimepiride (AMARYL) 4 MG tablet; Take 1 tablet (4 mg total) by mouth 2 (two) times daily.  Dispense: 180 tablet; Refill: 1  4. Hx of myocardial infarction He refuses to see cardiology encouraged exercise  5. Morbid obesity ([place]) Discussed diet and exercise for person with BMI >25 Will recheck weight in 3-6 months  6. Atherosclerosis of native coronary artery of native heart with angina pectoris with documented spasm (HCC) - metoprolol tartrate (LOPRESSOR) 25 MG tablet; Take 1 tablet (25 mg total) by mouth 2 (two) times daily.  Dispense: 180 tablet; Refill: 1  7. Chronic right shoulder pain - traMADol (ULTRAM) 50 MG tablet; Take 1 tablet (50 mg total) by mouth 2 (two) times daily.  Dispense: 60 tablet; Refill: 1     Labs pending Health Maintenance reviewed Diet and exercise encouraged  Follow up plan: 3 months   [name] Hassell Done, [name]

## 2019-01-31 ENCOUNTER — Other Ambulatory Visit: Payer: Self-pay

## 2019-01-31 ENCOUNTER — Encounter: Payer: Self-pay | Admitting: Nurse Practitioner

## 2019-01-31 ENCOUNTER — Ambulatory Visit (INDEPENDENT_AMBULATORY_CARE_PROVIDER_SITE_OTHER): Payer: Self-pay | Admitting: Nurse Practitioner

## 2019-01-31 VITALS — BP 137/80 | HR 75 | Temp 98.1°F | Ht 70.0 in | Wt 233.0 lb

## 2019-01-31 DIAGNOSIS — I10 Essential (primary) hypertension: Secondary | ICD-10-CM

## 2019-01-31 DIAGNOSIS — R300 Dysuria: Secondary | ICD-10-CM

## 2019-01-31 DIAGNOSIS — E875 Hyperkalemia: Secondary | ICD-10-CM

## 2019-01-31 DIAGNOSIS — Z23 Encounter for immunization: Secondary | ICD-10-CM

## 2019-01-31 DIAGNOSIS — E7800 Pure hypercholesterolemia, unspecified: Secondary | ICD-10-CM

## 2019-01-31 DIAGNOSIS — E1165 Type 2 diabetes mellitus with hyperglycemia: Secondary | ICD-10-CM

## 2019-01-31 LAB — MICROSCOPIC EXAMINATION
Epithelial Cells (non renal): NONE SEEN /hpf (ref 0–10)
RBC, Urine: NONE SEEN /hpf (ref 0–2)
Renal Epithel, UA: NONE SEEN /hpf

## 2019-01-31 LAB — URINALYSIS, COMPLETE
Bilirubin, UA: NEGATIVE
Leukocytes,UA: NEGATIVE
Nitrite, UA: NEGATIVE
RBC, UA: NEGATIVE
Specific Gravity, UA: 1.025 (ref 1.005–1.030)
Urobilinogen, Ur: 0.2 mg/dL (ref 0.2–1.0)
pH, UA: 5 (ref 5.0–7.5)

## 2019-01-31 LAB — BAYER DCA HB A1C WAIVED: HB A1C (BAYER DCA - WAIVED): 9.5 % — ABNORMAL HIGH (ref <7.0)

## 2019-01-31 MED ORDER — JANUMET 50-1000 MG PO TABS: 1.0000 | ORAL_TABLET | Freq: Two times a day (BID) | ORAL | 1 refills | Status: DC

## 2019-01-31 MED ORDER — LANTUS SOLOSTAR 100 UNIT/ML ~~LOC~~ SOPN: 45.0000 [IU] | PEN_INJECTOR | Freq: Two times a day (BID) | SUBCUTANEOUS | 99 refills | Status: DC

## 2019-01-31 NOTE — Patient Instructions (Signed)
Carbohydrate Counting for Diabetes Mellitus, Adult  Carbohydrate counting is a method of keeping track of how many carbohydrates you eat. Eating carbohydrates naturally increases the amount of sugar (glucose) in the blood. Counting how many carbohydrates you eat helps keep your blood glucose within normal limits, which helps you manage your diabetes (diabetes mellitus). It is important to know how many carbohydrates you can safely have in each meal. This is different for every person. A diet and nutrition specialist (registered dietitian) can help you make a meal plan and calculate how many carbohydrates you should have at each meal and snack. Carbohydrates are found in the following foods:  Grains, such as breads and cereals.  Dried beans and soy products.  Starchy vegetables, such as potatoes, peas, and corn.  Fruit and fruit juices.  Milk and yogurt.  Sweets and snack foods, such as cake, cookies, candy, chips, and soft drinks. How do I count carbohydrates? There are two ways to count carbohydrates in food. You can use either of the methods or a combination of both. Reading "Nutrition Facts" on packaged food The "Nutrition Facts" list is included on the labels of almost all packaged foods and beverages in the [place] It includes:  The serving size.  Information about nutrients in each serving, including the grams (g) of carbohydrate per serving. To use the "Nutrition Facts":  Decide how many servings you will have.  Multiply the number of servings by the number of carbohydrates per serving.  The resulting number is the total amount of carbohydrates that you will be having. Learning standard serving sizes of other foods When you eat carbohydrate foods that are not packaged or do not include "Nutrition Facts" on the label, you need to measure the servings in order to count the amount of carbohydrates:  Measure the foods that you will eat with a food scale or measuring cup, if needed.   Decide how many standard-size servings you will eat.  Multiply the number of servings by 15. Most carbohydrate-rich foods have about 15 g of carbohydrates per serving. ? For example, if you eat 8 oz (170 g) of strawberries, you will have eaten 2 servings and 30 g of carbohydrates (2 servings x 15 g = 30 g).  For foods that have more than one food mixed, such as soups and casseroles, you must count the carbohydrates in each food that is included. The following list contains standard serving sizes of common carbohydrate-rich foods. Each of these servings has about 15 g of carbohydrates:   hamburger bun or  English muffin.   oz (15 mL) syrup.   oz (14 g) jelly.  1 slice of bread.  1 six-inch tortilla.  3 oz (85 g) cooked rice or pasta.  4 oz (113 g) cooked dried beans.  4 oz (113 g) starchy vegetable, such as peas, corn, or potatoes.  4 oz (113 g) hot cereal.  4 oz (113 g) mashed potatoes or  of a large baked potato.  4 oz (113 g) canned or frozen fruit.  4 oz (120 mL) fruit juice.  4-6 crackers.  6 chicken nuggets.  6 oz (170 g) unsweetened dry cereal.  6 oz (170 g) plain fat-free yogurt or yogurt sweetened with artificial sweeteners.  8 oz (240 mL) milk.  8 oz (170 g) fresh fruit or one small piece of fruit.  24 oz (680 g) popped popcorn. Example of carbohydrate counting Sample meal  3 oz (85 g) chicken breast.  6 oz (170 g)   brown rice.  4 oz (113 g) corn.  8 oz (240 mL) milk.  8 oz (170 g) strawberries with sugar-free whipped topping. Carbohydrate calculation 1. Identify the foods that contain carbohydrates: ? Rice. ? Corn. ? Milk. ? Strawberries. 2. Calculate how many servings you have of each food: ? 2 servings rice. ? 1 serving corn. ? 1 serving milk. ? 1 serving strawberries. 3. Multiply each number of servings by 15 g: ? 2 servings rice x 15 g = 30 g. ? 1 serving corn x 15 g = 15 g. ? 1 serving milk x 15 g = 15 g. ? 1 serving  strawberries x 15 g = 15 g. 4. Add together all of the amounts to find the total grams of carbohydrates eaten: ? 30 g + 15 g + 15 g + 15 g = 75 g of carbohydrates total. Summary  Carbohydrate counting is a method of keeping track of how many carbohydrates you eat.  Eating carbohydrates naturally increases the amount of sugar (glucose) in the blood.  Counting how many carbohydrates you eat helps keep your blood glucose within normal limits, which helps you manage your diabetes.  A diet and nutrition specialist (registered dietitian) can help you make a meal plan and calculate how many carbohydrates you should have at each meal and snack. This information is not intended to replace advice given to you by your health care provider. Make sure you discuss any questions you have with your health care provider. Document Released: 05/11/2005 Document Revised: 12/03/2016 Document Reviewed: 10/23/2015 Elsevier Patient Education  2020 Elsevier Inc.  

## 2019-01-31 NOTE — Progress Notes (Signed)
Subjective:    Patient ID: Tyler Garner, male    DOB: 07/21/1955, 63 y.o.   MRN: [id number]   Chief Complaint: Medical Management of Chronic Issues    HPI:  1. Essential hypertension No c/o chest pain, sob or headache. Does not check blood pressure at home. BP Readings from Last 3 Encounters:  09/29/18 136/79  06/13/18 (!) 157/92  05/03/18 137/84     2. Pure hypercholesterolemia Does not watch diet and does no exercise.  3. Uncontrolled type 2 diabetes mellitus with hyperglycemia ([place]) He does not check blood sugars and does not watch diet. Lab Results  Component Value Date   HGBA1C 9.7 (H) 09/29/2018     4. Morbid obesity ([place]) No recent weight cjanges    Outpatient Encounter Medications as of 01/31/2019  Medication Sig  . amLODipine (NORVASC) 5 MG tablet Take 1 tablet (5 mg total) by mouth daily.  Marland Kitchen aspirin 81 MG tablet Take 81 mg by mouth daily.    Marland Kitchen glimepiride (AMARYL) 4 MG tablet Take 1 tablet (4 mg total) by mouth 2 (two) times daily.  Marland Kitchen glucose blood (ACCU-CHEK ACTIVE STRIPS) test strip Check blood sugar daily and prn  Dx E11.9  . Insulin Glargine (LANTUS SOLOSTAR) 100 UNIT/ML Solostar Pen Inject 80 Units into the skin daily.  . Insulin Pen Needle (PEN NEEDLES) 31G X 8 MM MISC Use with lantus solosar pen daily  . metoprolol tartrate (LOPRESSOR) 25 MG tablet Take 1 tablet (25 mg total) by mouth 2 (two) times daily.  . rosuvastatin (CRESTOR) 40 MG tablet Take 0.5 tablets (20 mg total) by mouth daily.  . sitaGLIPtin-metformin (JANUMET) 50-1000 MG tablet Take 1 tablet by mouth 2 (two) times daily with a meal.  . traMADol (ULTRAM) 50 MG tablet Take 1 tablet (50 mg total) by mouth 2 (two) times daily.    Past Surgical History:  Procedure Laterality Date  .  heart stent    . LEFT HEART CATHETERIZATION WITH CORONARY ANGIOGRAM N/A 06/21/2013   Procedure: LEFT HEART CATHETERIZATION WITH CORONARY ANGIOGRAM;  Surgeon: Peter M Martinique, MD;  Location: [facility] CATH LAB;   Service: Cardiovascular;  Laterality: N/A;    Family History  Problem Relation Age of Onset  . Diabetes Mother   . ALS Mother   . Alzheimer's disease Father   . Diabetes Brother     New complaints: Dysuria that started several day sago.  Social history: Wife died several tears ago and he is still having a hard time.   Controlled substance contract: n/a    Review of Systems  Constitutional: Negative for activity change and appetite change.  HENT: Negative.   Eyes: Negative for pain.  Respiratory: Negative for shortness of breath.   Cardiovascular: Negative for chest pain, palpitations and leg swelling.  Gastrointestinal: Negative for abdominal pain.  Endocrine: Negative for polydipsia.  Genitourinary: Positive for dysuria, frequency and urgency.  Skin: Negative for rash.  Neurological: Negative for dizziness, weakness and headaches.  Hematological: Does not bruise/bleed easily.  Psychiatric/Behavioral: Negative.   All other systems reviewed and are negative.      Objective:   Physical Exam Vitals signs and nursing note reviewed.  Constitutional:      Appearance: Normal appearance. He is well-developed.  HENT:     Head: Normocephalic.     Nose: Nose normal.  Eyes:     Pupils: Pupils are equal, round, and reactive to light.  Neck:     Musculoskeletal: Normal range of motion and neck  supple.     Thyroid: No thyroid mass or thyromegaly.     Vascular: No carotid bruit or JVD.     Trachea: Phonation normal.  Cardiovascular:     Rate and Rhythm: Normal rate and regular rhythm.  Pulmonary:     Effort: Pulmonary effort is normal. No respiratory distress.     Breath sounds: Normal breath sounds.  Abdominal:     General: Bowel sounds are normal.     Palpations: Abdomen is soft.     Tenderness: There is no abdominal tenderness.  Musculoskeletal: Normal range of motion.  Lymphadenopathy:     Cervical: No cervical adenopathy.  Skin:    General: Skin is warm and dry.   Neurological:     Mental Status: He is alert and oriented to person, place, and time.  Psychiatric:        Behavior: Behavior normal.        Thought Content: Thought content normal.        Judgment: Judgment normal.    BP 137/80   Pulse 75   Temp 98.1 F (36.7 C) (Oral)   Ht 5' 10"  (1.778 m)   Wt 233 lb (105.7 kg)   SpO2 99%   BMI 33.43 kg/m   hgba1c 9.5%  UA- clear     Assessment & Plan:  Tyler Garner comes in today with chief complaint of Medical Management of Chronic Issues   Diagnosis and orders addressed:  1. Essential hypertension Low sodium diet - Lipid panel - CMP14+EGFR - Bayer DCA Hb A1c Waived  2. Pure hypercholesterolemia Low fat diet - Lipid panel - CMP14+EGFR - Bayer DCA Hb A1c Waived  3. Type 2 diabetes mellitus with hyperglycemia, without long-term current use of insulin (HCC) Increase lantus to 45 u BID - sitaGLIPtin-metformin (JANUMET) 50-1000 MG tablet; Take 1 tablet by mouth 2 (two) times daily with a meal.  Dispense: 180 tablet; Refill: 1 - Lipid panel - CMP14+EGFR - Bayer DCA Hb A1c Waived  4. Morbid obesity ([place]) Discussed diet and exercise for person with BMI >25 Will recheck weight in 3-6 months  5. Dysuria urine clear Drink cranberry juice - Urinalysis, Complete   Labs pending Health Maintenance reviewed Diet and exercise encouraged  Follow up plan: 1 month   Orient, [name]

## 2019-02-01 ENCOUNTER — Other Ambulatory Visit: Payer: Self-pay | Admitting: Nurse Practitioner

## 2019-02-01 LAB — CMP14+EGFR
ALT: 29 IU/L (ref 0–44)
AST: 47 IU/L — ABNORMAL HIGH (ref 0–40)
Albumin/Globulin Ratio: 1.7 (ref 1.2–2.2)
Albumin: 4.6 g/dL (ref 3.8–4.8)
Alkaline Phosphatase: 64 IU/L (ref 39–117)
BUN/Creatinine Ratio: 16 (ref 10–24)
BUN: 13 mg/dL (ref 8–27)
Bilirubin Total: 0.2 mg/dL (ref 0.0–1.2)
CO2: 25 mmol/L (ref 20–29)
Calcium: 9.9 mg/dL (ref 8.6–10.2)
Chloride: 98 mmol/L (ref 96–106)
Creatinine, Ser: 0.8 mg/dL (ref 0.76–1.27)
GFR calc Af Amer: 109 mL/min/{1.73_m2} (ref >59)
GFR calc non Af Amer: 94 mL/min/{1.73_m2} (ref >59)
Globulin, Total: 2.7 g/dL (ref 1.5–4.5)
Glucose: 228 mg/dL — ABNORMAL HIGH (ref 65–99)
Potassium: 5.7 mmol/L — ABNORMAL HIGH (ref 3.5–5.2)
Sodium: 140 mmol/L (ref 134–144)
Total Protein: 7.3 g/dL (ref 6.0–8.5)

## 2019-02-01 LAB — MICROALBUMIN / CREATININE URINE RATIO
Creatinine, Urine: 122.6 mg/dL
Microalb/Creat Ratio: 73 mg/g creat — ABNORMAL HIGH (ref 0–29)
Microalbumin, Urine: 89.1 ug/mL

## 2019-02-01 LAB — LIPID PANEL
Chol/HDL Ratio: 8 ratio — ABNORMAL HIGH (ref 0.0–5.0)
Cholesterol, Total: 160 mg/dL (ref 100–199)
HDL: 20 mg/dL — ABNORMAL LOW (ref >39)
LDL Chol Calc (NIH): 68 mg/dL (ref 0–99)
Triglycerides: 460 mg/dL — ABNORMAL HIGH (ref 0–149)
VLDL Cholesterol Cal: 72 mg/dL — ABNORMAL HIGH (ref 5–40)

## 2019-02-01 MED ORDER — LISINOPRIL 10 MG PO TABS: 10.0000 mg | ORAL_TABLET | Freq: Every day | ORAL | 11 refills | Status: DC

## 2019-02-01 NOTE — Addendum Note (Signed)
Addended by: Chevis Pretty on: 02/01/2019 08:46 AM   Modules accepted: Orders

## 2019-02-03 ENCOUNTER — Encounter: Payer: Self-pay | Admitting: *Deleted

## 2019-03-01 ENCOUNTER — Other Ambulatory Visit: Payer: Self-pay

## 2019-03-02 ENCOUNTER — Encounter: Payer: Self-pay | Admitting: Nurse Practitioner

## 2019-03-02 ENCOUNTER — Ambulatory Visit (INDEPENDENT_AMBULATORY_CARE_PROVIDER_SITE_OTHER): Payer: Self-pay | Admitting: Nurse Practitioner

## 2019-03-02 VITALS — BP 134/76 | HR 73 | Temp 97.5°F | Resp 18 | Ht 70.0 in | Wt 233.0 lb

## 2019-03-02 DIAGNOSIS — E1165 Type 2 diabetes mellitus with hyperglycemia: Secondary | ICD-10-CM

## 2019-03-02 LAB — BAYER DCA HB A1C WAIVED: HB A1C (BAYER DCA - WAIVED): 9.1 % — ABNORMAL HIGH (ref <7.0)

## 2019-03-02 MED ORDER — LANTUS SOLOSTAR 100 UNIT/ML ~~LOC~~ SOPN: 45.0000 [IU] | PEN_INJECTOR | Freq: Two times a day (BID) | SUBCUTANEOUS | 99 refills | Status: DC

## 2019-03-02 MED ORDER — MELOXICAM 15 MG PO TABS: 15.0000 mg | ORAL_TABLET | Freq: Every day | ORAL | 0 refills | Status: DC

## 2019-03-02 NOTE — Progress Notes (Signed)
   Subjective:    Patient ID: Tyler Garner, male    DOB: April 29, 1956, 63 y.o.   MRN: [id number]   Chief Complaint: Diabetes   HPI Patient comes in today for follow up of diabetes. He has not been dong well with his diabetes since his wife died over 4 years ago. We have made many changes to his medications and encouraged diet control and exercise. However he is usualy not compliant. At last visit we increased his lantus to 45u along with continuing his janumet. He still does not check his blood sugars. Lab Results  Component Value Date   HGB 12.3 (L) 06/24/2013      Review of Systems  Constitutional: Negative for activity change and appetite change.  HENT: Negative.   Eyes: Negative for pain.  Respiratory: Negative for shortness of breath.   Cardiovascular: Negative for chest pain, palpitations and leg swelling.  Gastrointestinal: Negative for abdominal pain.  Endocrine: Negative for polydipsia.  Genitourinary: Negative.   Skin: Negative for rash.  Neurological: Negative for dizziness, weakness and headaches.  Hematological: Does not bruise/bleed easily.  Psychiatric/Behavioral: Negative.   All other systems reviewed and are negative.      Objective:   Physical Exam Vitals signs and nursing note reviewed.  Constitutional:      Appearance: Normal appearance. He is well-developed.  HENT:     Head: Normocephalic.     Nose: Nose normal.  Eyes:     Pupils: Pupils are equal, round, and reactive to light.  Neck:     Musculoskeletal: Normal range of motion and neck supple.     Thyroid: No thyroid mass or thyromegaly.     Vascular: No carotid bruit or JVD.     Trachea: Phonation normal.  Cardiovascular:     Rate and Rhythm: Normal rate and regular rhythm.  Pulmonary:     Effort: Pulmonary effort is normal. No respiratory distress.     Breath sounds: Normal breath sounds.  Abdominal:     General: Bowel sounds are normal.     Palpations: Abdomen is soft.     Tenderness:  There is no abdominal tenderness.  Musculoskeletal: Normal range of motion.  Lymphadenopathy:     Cervical: No cervical adenopathy.  Skin:    General: Skin is warm and dry.  Neurological:     Mental Status: He is alert and oriented to person, place, and time.  Psychiatric:        Behavior: Behavior normal.        Thought Content: Thought content normal.        Judgment: Judgment normal.    BP 134/76   Pulse 73   Temp (!) 97.5 F (36.4 C) (Temporal)   Resp 18   Ht 5\' 10"  (1.778 m)   Wt 233 lb (105.7 kg)   SpO2 98%   BMI 33.43 kg/m       Assessment & Plan:  Tyler Garner in today with chief complaint of Diabetes   1. Uncontrolled type 2 diabetes mellitus with hyperglycemia (HCC) Continue lantus 45u BID Please check blood sugars Watch carbs in diet - Bayer DCA Hb A1c Waived   [name] Hassell Done, [name]

## 2019-03-02 NOTE — Addendum Note (Signed)
Addended by: Chevis Pretty on: 03/02/2019 08:36 AM   Modules accepted: Level of Service

## 2019-03-26 ENCOUNTER — Other Ambulatory Visit: Payer: Self-pay | Admitting: Nurse Practitioner

## 2019-05-04 ENCOUNTER — Other Ambulatory Visit: Payer: Self-pay

## 2019-05-05 ENCOUNTER — Encounter: Payer: Self-pay | Admitting: Nurse Practitioner

## 2019-05-05 ENCOUNTER — Ambulatory Visit (INDEPENDENT_AMBULATORY_CARE_PROVIDER_SITE_OTHER): Payer: Self-pay | Admitting: Nurse Practitioner

## 2019-05-05 VITALS — BP 137/72 | HR 71 | Temp 96.8°F | Resp 20 | Ht 70.0 in | Wt 234.0 lb

## 2019-05-05 DIAGNOSIS — I25111 Atherosclerotic heart disease of native coronary artery with angina pectoris with documented spasm: Secondary | ICD-10-CM

## 2019-05-05 DIAGNOSIS — E1165 Type 2 diabetes mellitus with hyperglycemia: Secondary | ICD-10-CM

## 2019-05-05 DIAGNOSIS — I10 Essential (primary) hypertension: Secondary | ICD-10-CM

## 2019-05-05 DIAGNOSIS — E782 Mixed hyperlipidemia: Secondary | ICD-10-CM

## 2019-05-05 DIAGNOSIS — E7800 Pure hypercholesterolemia, unspecified: Secondary | ICD-10-CM

## 2019-05-05 LAB — BAYER DCA HB A1C WAIVED: HB A1C (BAYER DCA - WAIVED): 8.7 % — ABNORMAL HIGH (ref <7.0)

## 2019-05-05 MED ORDER — METOPROLOL TARTRATE 25 MG PO TABS: 25.0000 mg | ORAL_TABLET | Freq: Two times a day (BID) | ORAL | 1 refills | Status: DC

## 2019-05-05 MED ORDER — LANTUS SOLOSTAR 100 UNIT/ML ~~LOC~~ SOPN: 50.0000 [IU] | PEN_INJECTOR | Freq: Two times a day (BID) | SUBCUTANEOUS | 99 refills | Status: DC

## 2019-05-05 MED ORDER — JANUMET 50-1000 MG PO TABS: 1.0000 | ORAL_TABLET | Freq: Two times a day (BID) | ORAL | 1 refills | Status: DC

## 2019-05-05 MED ORDER — GLIMEPIRIDE 4 MG PO TABS: 4.0000 mg | ORAL_TABLET | Freq: Two times a day (BID) | ORAL | 1 refills | Status: DC

## 2019-05-05 MED ORDER — MELOXICAM 15 MG PO TABS: 15.0000 mg | ORAL_TABLET | Freq: Every day | ORAL | 1 refills | Status: DC

## 2019-05-05 MED ORDER — AMLODIPINE BESYLATE 5 MG PO TABS: 5.0000 mg | ORAL_TABLET | Freq: Every day | ORAL | 1 refills | Status: DC

## 2019-05-05 MED ORDER — ROSUVASTATIN CALCIUM 40 MG PO TABS: 20.0000 mg | ORAL_TABLET | Freq: Every day | ORAL | 5 refills | Status: DC

## 2019-05-05 MED ORDER — LISINOPRIL 10 MG PO TABS: 10.0000 mg | ORAL_TABLET | Freq: Every day | ORAL | 11 refills | Status: DC

## 2019-05-05 NOTE — Progress Notes (Signed)
Subjective:    Patient ID: Tyler Garner, male    DOB: 1955/07/30, 63 y.o.   MRN: [id number]   Chief Complaint: Medical Management of Chronic Issues    HPI:  1. Uncontrolled type 2 diabetes mellitus with hyperglycemia ([place]) He does not check his blood at home. He does not watch diet and does no exercise. Lab Results  Component Value Date   HGBA1C 9.1 (H) 03/02/2019      2. Essential hypertension No c/o chest pain, sob or headache. Does not check blood pressure at home. BP Readings from Last 3 Encounters:  05/05/19 137/72  03/02/19 134/76  01/31/19 137/80     3. Pure hypercholesterolemia Does not watch diet and does very little exercise. Lab Results  Component Value Date   CHOL 160 01/31/2019   HDL 20 (L) 01/31/2019   LDLCALC 68 01/31/2019   TRIG 460 (H) 01/31/2019   CHOLHDL 8.0 (H) 01/31/2019     4. Atherosclerosis of native coronary artery of native heart with angina pectoris with documented spasm ([place]) He denies any chest pain or sob  5. Morbid obesity ([place]) No recent weight changes Wt Readings from Last 3 Encounters:  05/05/19 234 lb (106.1 kg)  03/02/19 233 lb (105.7 kg)  01/31/19 233 lb (105.7 kg)   BMI Readings from Last 3 Encounters:  05/05/19 33.58 kg/m  03/02/19 33.43 kg/m  01/31/19 33.43 kg/m       Outpatient Encounter Medications as of 05/05/2019  Medication Sig  . amLODipine (NORVASC) 5 MG tablet Take 1 tablet (5 mg total) by mouth daily.  Marland Kitchen aspirin 81 MG tablet Take 81 mg by mouth daily.    Marland Kitchen glimepiride (AMARYL) 4 MG tablet Take 1 tablet (4 mg total) by mouth 2 (two) times daily.  Marland Kitchen glucose blood (ACCU-CHEK ACTIVE STRIPS) test strip Check blood sugar daily and prn  Dx E11.9  . Insulin Glargine (LANTUS SOLOSTAR) 100 UNIT/ML Solostar Pen Inject 45 Units into the skin 2 (two) times daily.  . Insulin Pen Needle (PEN NEEDLES) 31G X 8 MM MISC Use with lantus solosar pen daily  . lisinopril (ZESTRIL) 10 MG tablet Take 1 tablet (10 mg  total) by mouth daily.  . meloxicam (MOBIC) 15 MG tablet TAKE 1 TABLET BY MOUTH EVERY DAY  . metoprolol tartrate (LOPRESSOR) 25 MG tablet Take 1 tablet (25 mg total) by mouth 2 (two) times daily.  . rosuvastatin (CRESTOR) 40 MG tablet Take 0.5 tablets (20 mg total) by mouth daily.  . sitaGLIPtin-metformin (JANUMET) 50-1000 MG tablet Take 1 tablet by mouth 2 (two) times daily with a meal.     Past Surgical History:  Procedure Laterality Date  .  heart stent    . LEFT HEART CATHETERIZATION WITH CORONARY ANGIOGRAM N/A 06/21/2013   Procedure: LEFT HEART CATHETERIZATION WITH CORONARY ANGIOGRAM;  Surgeon: Peter M Martinique, MD;  Location: [facility] CATH LAB;  Service: Cardiovascular;  Laterality: N/A;    Family History  Problem Relation Age of Onset  . Diabetes Mother   . ALS Mother   . Alzheimer's disease Father   . Diabetes Brother     New complaints: None today  Social history: Lives by hisself  Controlled substance contract: n/a    Review of Systems  Constitutional: Negative for diaphoresis.  Eyes: Negative for pain.  Respiratory: Negative for shortness of breath.   Cardiovascular: Negative for chest pain, palpitations and leg swelling.  Gastrointestinal: Negative for abdominal pain.  Endocrine: Negative for polydipsia.  Skin: Negative for  rash.  Neurological: Negative for dizziness, weakness and headaches.  Hematological: Does not bruise/bleed easily.  All other systems reviewed and are negative.      Objective:   Physical Exam Vitals and nursing note reviewed.  Constitutional:      Appearance: Normal appearance. He is well-developed.  HENT:     Head: Normocephalic.     Nose: Nose normal.  Eyes:     Pupils: Pupils are equal, round, and reactive to light.  Neck:     Thyroid: No thyroid mass or thyromegaly.     Vascular: No carotid bruit or JVD.     Trachea: Phonation normal.  Cardiovascular:     Rate and Rhythm: Normal rate and regular rhythm.  Pulmonary:      Effort: Pulmonary effort is normal. No respiratory distress.     Breath sounds: Normal breath sounds.  Abdominal:     General: Bowel sounds are normal.     Palpations: Abdomen is soft.     Tenderness: There is no abdominal tenderness.  Musculoskeletal:        General: Normal range of motion.     Cervical back: Normal range of motion and neck supple.  Lymphadenopathy:     Cervical: No cervical adenopathy.  Skin:    General: Skin is warm and dry.  Neurological:     Mental Status: He is alert and oriented to person, place, and time.  Psychiatric:        Behavior: Behavior normal.        Thought Content: Thought content normal.        Judgment: Judgment normal.     BP 137/72   Pulse 71   Temp (!) 96.8 F (36 C) (Temporal)   Resp 20   Ht 5' 10" (1.778 m)   Wt 234 lb (106.1 kg)   SpO2 98%   BMI 33.58 kg/m   hgba1c 8.7%      Assessment & Plan:  Indie R Deam comes in today with chief complaint of Medical Management of Chronic Issues   Diagnosis and orders addressed:  1. Essential hypertension Low sodium idet - CMP14+EGFR - amLODipine (NORVASC) 5 MG tablet; Take 1 tablet (5 mg total) by mouth daily.  Dispense: 90 tablet; Refill: 1 - lisinopril (ZESTRIL) 10 MG tablet; Take 1 tablet (10 mg total) by mouth daily.  Dispense: 30 tablet; Refill: 11  2. Pure hypercholesterolemia Low fat diet - Lipid panel  3. Atherosclerosis of native coronary artery of native heart with angina pectoris with documented spasm ([facility]) - metoprolol tartrate (LOPRESSOR) 25 MG tablet; Take 1 tablet (25 mg total) by mouth 2 (two) times daily.  Dispense: 180 tablet; Refill: 1  4. Morbid obesity ([facility]) Discussed diet and exercise for person with BMI >25 Will recheck weight in 3-6 months  5. Mixed hyperlipidemia Low fat diet - rosuvastatin (CRESTOR) 40 MG tablet; Take 0.5 tablets (20 mg total) by mouth daily.  Dispense: 45 tablet; Refill: 5  6. Type 2 diabetes mellitus with hyperglycemia,  without long-term current use of insulin ([facility]) Strict carb counting Increase lantus to 50u BID - hgba1c - sitaGLIPtin-metformin (JANUMET) 50-1000 MG tablet; Take 1 tablet by mouth 2 (two) times daily with a meal.  Dispense: 180 tablet; Refill: 1 - glimepiride (AMARYL) 4 MG tablet; Take 1 tablet (4 mg total) by mouth 2 (two) times daily.  Dispense: 180 tablet; Refill: 1 - Insulin Glargine (LANTUS SOLOSTAR) 100 UNIT/ML Solostar Pen; Inject 50 Units into the skin 2 (two) times   daily.  Dispense: 10 pen; Refill: PRN   Labs pending Health Maintenance reviewed Diet and exercise encouraged  Follow up plan: 3 months   [name] Martin, [name]  

## 2019-05-05 NOTE — Patient Instructions (Signed)
Diabetes Mellitus and Foot Care Foot care is an important part of your health, especially when you have diabetes. Diabetes may cause you to have problems because of poor blood flow (circulation) to your feet and legs, which can cause your skin to:  Become thinner and drier.  Break more easily.  Heal more slowly.  Peel and crack. You may also have nerve damage (neuropathy) in your legs and feet, causing decreased feeling in them. This means that you may not notice minor injuries to your feet that could lead to more serious problems. Noticing and addressing any potential problems early is the best way to prevent future foot problems. How to care for your feet Foot hygiene  Wash your feet daily with warm water and mild soap. Do not use hot water. Then, pat your feet and the areas between your toes until they are completely dry. Do not soak your feet as this can dry your skin.  Trim your toenails straight across. Do not dig under them or around the cuticle. File the edges of your nails with an emery board or nail file.  Apply a moisturizing lotion or petroleum jelly to the skin on your feet and to dry, brittle toenails. Use lotion that does not contain alcohol and is unscented. Do not apply lotion between your toes. Shoes and socks  Wear clean socks or stockings every day. Make sure they are not too tight. Do not wear knee-high stockings since they may decrease blood flow to your legs.  Wear shoes that fit properly and have enough cushioning. Always look in your shoes before you put them on to be sure there are no objects inside.  To break in new shoes, wear them for just a few hours a day. This prevents injuries on your feet. Wounds, scrapes, corns, and calluses  Check your feet daily for blisters, cuts, bruises, sores, and redness. If you cannot see the bottom of your feet, use a mirror or ask someone for help.  Do not cut corns or calluses or try to remove them with medicine.  If you  find a minor scrape, cut, or break in the skin on your feet, keep it and the skin around it clean and dry. You may clean these areas with mild soap and water. Do not clean the area with peroxide, alcohol, or iodine.  If you have a wound, scrape, corn, or callus on your foot, look at it several times a day to make sure it is healing and not infected. Check for: ? Redness, swelling, or pain. ? Fluid or blood. ? Warmth. ? Pus or a bad smell. General instructions  Do not cross your legs. This may decrease blood flow to your feet.  Do not use heating pads or hot water bottles on your feet. They may burn your skin. If you have lost feeling in your feet or legs, you may not know this is happening until it is too late.  Protect your feet from hot and cold by wearing shoes, such as at the beach or on hot pavement.  Schedule a complete foot exam at least once a year (annually) or more often if you have foot problems. If you have foot problems, report any cuts, sores, or bruises to your health care provider immediately. Contact a health care provider if:  You have a medical condition that increases your risk of infection and you have any cuts, sores, or bruises on your feet.  You have an injury that is not   healing.  You have redness on your legs or feet.  You feel burning or tingling in your legs or feet.  You have pain or cramps in your legs and feet.  Your legs or feet are numb.  Your feet always feel cold.  You have pain around a toenail. Get help right away if:  You have a wound, scrape, corn, or callus on your foot and: ? You have pain, swelling, or redness that gets worse. ? You have fluid or blood coming from the wound, scrape, corn, or callus. ? Your wound, scrape, corn, or callus feels warm to the touch. ? You have pus or a bad smell coming from the wound, scrape, corn, or callus. ? You have a fever. ? You have a red line going up your leg. Summary  Check your feet every day  for cuts, sores, red spots, swelling, and blisters.  Moisturize feet and legs daily.  Wear shoes that fit properly and have enough cushioning.  If you have foot problems, report any cuts, sores, or bruises to your health care provider immediately.  Schedule a complete foot exam at least once a year (annually) or more often if you have foot problems. This information is not intended to replace advice given to you by your health care provider. Make sure you discuss any questions you have with your health care provider. Document Released: 05/08/2000 Document Revised: 06/23/2017 Document Reviewed: 06/12/2016 Elsevier Patient Education  2020 [employer]  

## 2019-05-06 LAB — LIPID PANEL
Chol/HDL Ratio: 6.7 ratio — ABNORMAL HIGH (ref 0.0–5.0)
Cholesterol, Total: 154 mg/dL (ref 100–199)
HDL: 23 mg/dL — ABNORMAL LOW (ref >39)
LDL Chol Calc (NIH): 74 mg/dL (ref 0–99)
Triglycerides: 353 mg/dL — ABNORMAL HIGH (ref 0–149)
VLDL Cholesterol Cal: 57 mg/dL — ABNORMAL HIGH (ref 5–40)

## 2019-05-06 LAB — CMP14+EGFR
ALT: 30 IU/L (ref 0–44)
AST: 47 IU/L — ABNORMAL HIGH (ref 0–40)
Albumin/Globulin Ratio: 2 (ref 1.2–2.2)
Albumin: 4.6 g/dL (ref 3.8–4.8)
Alkaline Phosphatase: 55 IU/L (ref 39–117)
BUN/Creatinine Ratio: 16 (ref 10–24)
BUN: 11 mg/dL (ref 8–27)
Bilirubin Total: 0.4 mg/dL (ref 0.0–1.2)
CO2: 26 mmol/L (ref 20–29)
Calcium: 9.4 mg/dL (ref 8.6–10.2)
Chloride: 102 mmol/L (ref 96–106)
Creatinine, Ser: 0.7 mg/dL — ABNORMAL LOW (ref 0.76–1.27)
GFR calc Af Amer: 115 mL/min/{1.73_m2} (ref >59)
GFR calc non Af Amer: 100 mL/min/{1.73_m2} (ref >59)
Globulin, Total: 2.3 g/dL (ref 1.5–4.5)
Glucose: 107 mg/dL — ABNORMAL HIGH (ref 65–99)
Potassium: 4.9 mmol/L (ref 3.5–5.2)
Sodium: 143 mmol/L (ref 134–144)
Total Protein: 6.9 g/dL (ref 6.0–8.5)

## 2019-08-04 ENCOUNTER — Other Ambulatory Visit: Payer: Self-pay

## 2019-08-07 ENCOUNTER — Ambulatory Visit (INDEPENDENT_AMBULATORY_CARE_PROVIDER_SITE_OTHER): Payer: Medicaid Other | Admitting: Nurse Practitioner

## 2019-08-07 ENCOUNTER — Other Ambulatory Visit: Payer: Self-pay

## 2019-08-07 ENCOUNTER — Encounter: Payer: Self-pay | Admitting: Nurse Practitioner

## 2019-08-07 VITALS — BP 142/88 | HR 80 | Temp 97.7°F | Ht 70.0 in | Wt 237.4 lb

## 2019-08-07 DIAGNOSIS — M25552 Pain in left hip: Secondary | ICD-10-CM

## 2019-08-07 DIAGNOSIS — I25111 Atherosclerotic heart disease of native coronary artery with angina pectoris with documented spasm: Secondary | ICD-10-CM

## 2019-08-07 DIAGNOSIS — E782 Mixed hyperlipidemia: Secondary | ICD-10-CM

## 2019-08-07 DIAGNOSIS — M25551 Pain in right hip: Secondary | ICD-10-CM

## 2019-08-07 DIAGNOSIS — E1165 Type 2 diabetes mellitus with hyperglycemia: Secondary | ICD-10-CM

## 2019-08-07 DIAGNOSIS — I10 Essential (primary) hypertension: Secondary | ICD-10-CM

## 2019-08-07 LAB — BAYER DCA HB A1C WAIVED: HB A1C (BAYER DCA - WAIVED): 8.4 % — ABNORMAL HIGH (ref <7.0)

## 2019-08-07 MED ORDER — LANTUS SOLOSTAR 100 UNIT/ML ~~LOC~~ SOPN: 55.0000 [IU] | PEN_INJECTOR | Freq: Two times a day (BID) | SUBCUTANEOUS | 99 refills | Status: DC

## 2019-08-07 MED ORDER — AMLODIPINE BESYLATE 5 MG PO TABS: 5.0000 mg | ORAL_TABLET | Freq: Every day | ORAL | 1 refills | Status: DC

## 2019-08-07 MED ORDER — GLIMEPIRIDE 4 MG PO TABS: 4.0000 mg | ORAL_TABLET | Freq: Two times a day (BID) | ORAL | 1 refills | Status: DC

## 2019-08-07 MED ORDER — JANUMET 50-1000 MG PO TABS: 1.0000 | ORAL_TABLET | Freq: Two times a day (BID) | ORAL | 1 refills | Status: DC

## 2019-08-07 MED ORDER — ROSUVASTATIN CALCIUM 40 MG PO TABS: 20.0000 mg | ORAL_TABLET | Freq: Every day | ORAL | 5 refills | Status: DC

## 2019-08-07 MED ORDER — MELOXICAM 15 MG PO TABS: 15.0000 mg | ORAL_TABLET | Freq: Every day | ORAL | 1 refills | Status: DC

## 2019-08-07 MED ORDER — LISINOPRIL 10 MG PO TABS: 10.0000 mg | ORAL_TABLET | Freq: Every day | ORAL | 11 refills | Status: DC

## 2019-08-07 NOTE — Patient Instructions (Signed)
DASH Eating Plan DASH stands for "Dietary Approaches to Stop Hypertension." The DASH eating plan is a healthy eating plan that has been shown to reduce high blood pressure (hypertension). It may also reduce your risk for type 2 diabetes, heart disease, and stroke. The DASH eating plan may also help with weight loss. What are tips for following this plan?  General guidelines  Avoid eating more than 2,300 mg (milligrams) of salt (sodium) a day. If you have hypertension, you may need to reduce your sodium intake to 1,500 mg a day.  Limit alcohol intake to no more than 1 drink a day for nonpregnant women and 2 drinks a day for men. One drink equals 12 oz of beer, 5 oz of wine, or 1 oz of hard liquor.  Work with your health care provider to maintain a healthy body weight or to lose weight. Ask what an ideal weight is for you.  Get at least 30 minutes of exercise that causes your heart to beat faster (aerobic exercise) most days of the week. Activities may include walking, swimming, or biking.  Work with your health care provider or diet and nutrition specialist (dietitian) to adjust your eating plan to your individual calorie needs. Reading food labels   Check food labels for the amount of sodium per serving. Choose foods with less than 5 percent of the Daily Value of sodium. Generally, foods with less than 300 mg of sodium per serving fit into this eating plan.  To find whole grains, look for the word "whole" as the first word in the ingredient list. Shopping  Buy products labeled as "low-sodium" or "no salt added."  Buy fresh foods. Avoid canned foods and premade or frozen meals. Cooking  Avoid adding salt when cooking. Use salt-free seasonings or herbs instead of table salt or sea salt. Check with your health care provider or pharmacist before using salt substitutes.  Do not fry foods. Cook foods using healthy methods such as baking, boiling, grilling, and broiling instead.  Cook with  heart-healthy oils, such as olive, canola, soybean, or sunflower oil. Meal planning  Eat a balanced diet that includes: ? 5 or more servings of fruits and vegetables each day. At each meal, try to fill half of your plate with fruits and vegetables. ? Up to 6-8 servings of whole grains each day. ? Less than 6 oz of lean meat, poultry, or fish each day. A 3-oz serving of meat is about the same size as a deck of cards. One egg equals 1 oz. ? 2 servings of low-fat dairy each day. ? A serving of nuts, seeds, or beans 5 times each week. ? Heart-healthy fats. Healthy fats called Omega-3 fatty acids are found in foods such as flaxseeds and coldwater fish, like sardines, salmon, and mackerel.  Limit how much you eat of the following: ? Canned or prepackaged foods. ? Food that is high in trans fat, such as fried foods. ? Food that is high in saturated fat, such as fatty meat. ? Sweets, desserts, sugary drinks, and other foods with added sugar. ? Full-fat dairy products.  Do not salt foods before eating.  Try to eat at least 2 vegetarian meals each week.  Eat more home-cooked food and less restaurant, buffet, and fast food.  When eating at a restaurant, ask that your food be prepared with less salt or no salt, if possible. What foods are recommended? The items listed may not be a complete list. Talk with your dietitian about   what dietary choices are best for you. Grains Whole-grain or whole-wheat bread. Whole-grain or whole-wheat pasta. Brown rice. Oatmeal. Quinoa. Bulgur. Whole-grain and low-sodium cereals. Pita bread. Low-fat, low-sodium crackers. Whole-wheat flour tortillas. Vegetables Fresh or frozen vegetables (raw, steamed, roasted, or grilled). Low-sodium or reduced-sodium tomato and vegetable juice. Low-sodium or reduced-sodium tomato sauce and tomato paste. Low-sodium or reduced-sodium canned vegetables. Fruits All fresh, dried, or frozen fruit. Canned fruit in natural juice (without  added sugar). Meat and other protein foods Skinless chicken or turkey. Ground chicken or turkey. Pork with fat trimmed off. Fish and seafood. Egg whites. Dried beans, peas, or lentils. Unsalted nuts, nut butters, and seeds. Unsalted canned beans. Lean cuts of beef with fat trimmed off. Low-sodium, lean deli meat. Dairy Low-fat (1%) or fat-free (skim) milk. Fat-free, low-fat, or reduced-fat cheeses. Nonfat, low-sodium ricotta or cottage cheese. Low-fat or nonfat yogurt. Low-fat, low-sodium cheese. Fats and oils Soft margarine without trans fats. Vegetable oil. Low-fat, reduced-fat, or light mayonnaise and salad dressings (reduced-sodium). Canola, safflower, olive, soybean, and sunflower oils. Avocado. Seasoning and other foods Herbs. Spices. Seasoning mixes without salt. Unsalted popcorn and pretzels. Fat-free sweets. What foods are not recommended? The items listed may not be a complete list. Talk with your dietitian about what dietary choices are best for you. Grains Baked goods made with fat, such as croissants, muffins, or some breads. Dry pasta or rice meal packs. Vegetables Creamed or fried vegetables. Vegetables in a cheese sauce. Regular canned vegetables (not low-sodium or reduced-sodium). Regular canned tomato sauce and paste (not low-sodium or reduced-sodium). Regular tomato and vegetable juice (not low-sodium or reduced-sodium). Pickles. Olives. Fruits Canned fruit in a light or heavy syrup. Fried fruit. Fruit in cream or butter sauce. Meat and other protein foods Fatty cuts of meat. Ribs. Fried meat. Bacon. Sausage. Bologna and other processed lunch meats. Salami. Fatback. Hotdogs. Bratwurst. Salted nuts and seeds. Canned beans with added salt. Canned or smoked fish. Whole eggs or egg yolks. Chicken or turkey with skin. Dairy Whole or 2% milk, cream, and half-and-half. Whole or full-fat cream cheese. Whole-fat or sweetened yogurt. Full-fat cheese. Nondairy creamers. Whipped toppings.  Processed cheese and cheese spreads. Fats and oils Butter. Stick margarine. Lard. Shortening. Ghee. Bacon fat. Tropical oils, such as coconut, palm kernel, or palm oil. Seasoning and other foods Salted popcorn and pretzels. Onion salt, garlic salt, seasoned salt, table salt, and sea salt. Worcestershire sauce. Tartar sauce. Barbecue sauce. Teriyaki sauce. Soy sauce, including reduced-sodium. Steak sauce. Canned and packaged gravies. Fish sauce. Oyster sauce. Cocktail sauce. Horseradish that you find on the shelf. Ketchup. Mustard. Meat flavorings and tenderizers. Bouillon cubes. Hot sauce and Tabasco sauce. Premade or packaged marinades. Premade or packaged taco seasonings. Relishes. Regular salad dressings. Where to find more information:  National Heart, Lung, and Blood Institute: www.nhlbi.nih.gov  American Heart Association: www.heart.org Summary  The DASH eating plan is a healthy eating plan that has been shown to reduce high blood pressure (hypertension). It may also reduce your risk for type 2 diabetes, heart disease, and stroke.  With the DASH eating plan, you should limit salt (sodium) intake to 2,300 mg a day. If you have hypertension, you may need to reduce your sodium intake to 1,500 mg a day.  When on the DASH eating plan, aim to eat more fresh fruits and vegetables, whole grains, lean proteins, low-fat dairy, and heart-healthy fats.  Work with your health care provider or diet and nutrition specialist (dietitian) to adjust your eating plan to your   individual calorie needs. This information is not intended to replace advice given to you by your health care provider. Make sure you discuss any questions you have with your health care provider. Document Revised: 04/23/2017 Document Reviewed: 05/04/2016 Elsevier Patient Education  2020 [employer] Inc.  

## 2019-08-07 NOTE — Progress Notes (Signed)
Subjective:    Patient ID: Tyler Garner, male    DOB: [birth year], 64 y.o.   MRN: [id number]   Chief Complaint: Medical Management of Chronic Issues    HPI:  1. Type 2 diabetes mellitus with hyperglycemia, without long-term current use of insulin (HCC) Does not check blood sugars and has been trying to watch diet. No exercise. Lab Results  Component Value Date   HGBA1C 8.7 (H) 05/05/2019     2. Essential hypertension No c/o chest pain, sob or headache. Does not check blood pressure at home. BP Readings from Last 3 Encounters:  08/07/19 (!) 154/82  05/05/19 137/72  03/02/19 134/76    3. Pure hypercholesterolemia Does not watch diet and does very little to no exercise. Lab Results  Component Value Date   CHOL 154 05/05/2019   HDL 23 (L) 05/05/2019   LDLCALC 74 05/05/2019   TRIG 353 (H) 05/05/2019   CHOLHDL 6.7 (H) 05/05/2019     4. Atherosclerosis of native coronary artery of native heart with angina pectoris with documented spasm [facility]) Has not sen cardiology in a long while. Says he feels fine and does not want to go back.  5. Morbid obesity ([place]) No recent weight changes Wt Readings from Last 3 Encounters:  08/07/19 237 lb 6.4 oz (107.7 kg)  05/05/19 234 lb (106.1 kg)  03/02/19 233 lb (105.7 kg)   BP Readings from Last 3 Encounters:  08/07/19 (!) 154/82  05/05/19 137/72  03/02/19 134/76       Outpatient Encounter Medications as of 08/07/2019  Medication Sig  . amLODipine (NORVASC) 5 MG tablet Take 1 tablet (5 mg total) by mouth daily.  Marland Kitchen aspirin 81 MG tablet Take 81 mg by mouth daily.    Marland Kitchen glimepiride (AMARYL) 4 MG tablet Take 1 tablet (4 mg total) by mouth 2 (two) times daily.  Marland Kitchen glucose blood (ACCU-CHEK ACTIVE STRIPS) test strip Check blood sugar daily and prn  Dx E11.9  . Insulin Glargine (LANTUS SOLOSTAR) 100 UNIT/ML Solostar Pen Inject 50 Units into the skin 2 (two) times daily.  . Insulin Pen Needle (PEN NEEDLES) 31G X 8 MM MISC Use with lantus  solosar pen daily  . lisinopril (ZESTRIL) 10 MG tablet Take 1 tablet (10 mg total) by mouth daily.  . meloxicam (MOBIC) 15 MG tablet Take 1 tablet (15 mg total) by mouth daily.  . metoprolol tartrate (LOPRESSOR) 25 MG tablet Take 1 tablet (25 mg total) by mouth 2 (two) times daily.  . rosuvastatin (CRESTOR) 40 MG tablet Take 0.5 tablets (20 mg total) by mouth daily.  . sitaGLIPtin-metformin (JANUMET) 50-1000 MG tablet Take 1 tablet by mouth 2 (two) times daily with a meal.     Past Surgical History:  Procedure Laterality Date  .  heart stent    . LEFT HEART CATHETERIZATION WITH CORONARY ANGIOGRAM N/A 06/21/2013   Procedure: LEFT HEART CATHETERIZATION WITH CORONARY ANGIOGRAM;  Surgeon: Peter M Martinique, MD;  [facility] C[facility];  Service: Cardiovascular;  Laterality: N/A;    Family History  Problem Relation Age of Onset  . Diabetes Mother   . ALS Mother   . Alzheimer's disease Father   . Diabetes Brother     New complaints: None today  Social history: Lives with his son. Sees his granddaughter several times a week  Controlled substance contract: n/a    Review of Systems  Constitutional: Negative for diaphoresis.  Eyes: Negative for pain.  Respiratory: Negative for shortness of breath.  Cardiovascular: Negative for chest pain, palpitations and leg swelling.  Gastrointestinal: Negative for abdominal pain.  Endocrine: Negative for polydipsia.  Skin: Negative for rash.  Neurological: Negative for dizziness, weakness and headaches.  Hematological: Does not bruise/bleed easily.  All other systems reviewed and are negative.      Objective:   Physical Exam Vitals and nursing note reviewed.  Constitutional:      Appearance: Normal appearance. He is well-developed.  HENT:     Head: Normocephalic.     Nose: Nose normal.  Eyes:     Pupils: Pupils are equal, round, and reactive to light.  Neck:     Thyroid: No thyroid mass or thyromegaly.     Vascular: No carotid bruit  or JVD.     Trachea: Phonation normal.  Cardiovascular:     Rate and Rhythm: Normal rate and regular rhythm.  Pulmonary:     Effort: Pulmonary effort is normal. No respiratory distress.     Breath sounds: Normal breath sounds.  Abdominal:     General: Bowel sounds are normal.     Palpations: Abdomen is soft.     Tenderness: There is no abdominal tenderness.  Musculoskeletal:        General: Normal range of motion.     Cervical back: Normal range of motion and neck supple.  Lymphadenopathy:     Cervical: No cervical adenopathy.  Skin:    General: Skin is warm and dry.  Neurological:     Mental Status: He is alert and oriented to person, place, and time.  Psychiatric:        Behavior: Behavior normal.        Thought Content: Thought content normal.        Judgment: Judgment normal.    BP (!) 142/88 (BP Location: Right Arm)   Pulse 80   Temp 97.7 F (36.5 C) (Temporal)   Ht 5' 10"  (1.778 m)   Wt 237 lb 6.4 oz (107.7 kg)   SpO2 97%   BMI 34.06 kg/m   HGBA1c 8.4%       Assessment & Plan:  LEIB ELAHI comes in today with chief complaint of Medical Management of Chronic Issues   Diagnosis and orders addressed:  1. Type 2 diabetes mellitus with hyperglycemia, without long-term current use of insulin (HCC) Increased lantis to 55u BOD Continue all othe rmeds - Bayer DCA Hb A1c Waived - insulin glargine (LANTUS SOLOSTAR) 100 UNIT/ML Solostar Pen; Inject 55 Units into the skin 2 (two) times daily.  Dispense: 10 pen; Refill: PRN - glimepiride (AMARYL) 4 MG tablet; Take 1 tablet (4 mg total) by mouth 2 (two) times daily.  Dispense: 180 tablet; Refill: 1 - sitaGLIPtin-metformin (JANUMET) 50-1000 MG tablet; Take 1 tablet by mouth 2 (two) times daily with a meal.  Dispense: 180 tablet; Refill: 1  2. Essential hypertension Low sodium diet - CMP14+EGFR - CBC with Differential/Platelet - amLODipine (NORVASC) 5 MG tablet; Take 1 tablet (5 mg total) by mouth daily.   Dispense: 90 tablet; Refill: 1 - lisinopril (ZESTRIL) 10 MG tablet; Take 1 tablet (10 mg total) by mouth daily.  Dispense: 30 tablet; Refill: 11  3. Atherosclerosis of native coronary artery of native heart with angina pectoris with documented spasm [facility]) Does not want to see cardiology  4. Morbid obesity ([place]) Discussed diet and exercise for person with BMI >25 Will recheck weight in 3-6 months   5. Mixed hyperlipidemia Low fat diet - Lipid panel - rosuvastatin (CRESTOR) 40  MG tablet; Take 0.5 tablets (20 mg total) by mouth daily.  Dispense: 45 tablet; Refill: 5  6. Pain of both hip joints Moist heat rest - meloxicam (MOBIC) 15 MG tablet; Take 1 tablet (15 mg total) by mouth daily.  Dispense: 90 tablet; Refill: 1   Labs pending Health Maintenance reviewed Diet and exercise encouraged  Follow up plan: 3 months   [name] Hassell Done, [name]

## 2019-08-08 LAB — CBC WITH DIFFERENTIAL/PLATELET
Basophils Absolute: 0.1 10*3/uL (ref 0.0–0.2)
Basos: 1 %
EOS (ABSOLUTE): 0.2 10*3/uL (ref 0.0–0.4)
Eos: 2 %
Hematocrit: 41.3 % (ref 37.5–51.0)
Hemoglobin: 14 g/dL (ref 13.0–17.7)
Immature Grans (Abs): 0 10*3/uL (ref 0.0–0.1)
Immature Granulocytes: 0 %
Lymphocytes Absolute: 3 10*3/uL (ref 0.7–3.1)
Lymphs: 38 %
MCH: 29.8 pg (ref 26.6–33.0)
MCHC: 33.9 g/dL (ref 31.5–35.7)
MCV: 88 fL (ref 79–97)
Monocytes Absolute: 0.5 10*3/uL (ref 0.1–0.9)
Monocytes: 7 %
Neutrophils Absolute: 4.1 10*3/uL (ref 1.4–7.0)
Neutrophils: 52 %
Platelets: 286 10*3/uL (ref 150–450)
RBC: 4.7 x10E6/uL (ref 4.14–5.80)
RDW: 15.1 % (ref 11.6–15.4)
WBC: 7.8 10*3/uL (ref 3.4–10.8)

## 2019-08-08 LAB — CMP14+EGFR
ALT: 21 IU/L (ref 0–44)
AST: 24 IU/L (ref 0–40)
Albumin/Globulin Ratio: 1.9 (ref 1.2–2.2)
Albumin: 4.8 g/dL (ref 3.8–4.8)
Alkaline Phosphatase: 60 IU/L (ref 39–117)
BUN/Creatinine Ratio: 18 (ref 10–24)
BUN: 13 mg/dL (ref 8–27)
Bilirubin Total: 0.3 mg/dL (ref 0.0–1.2)
CO2: 27 mmol/L (ref 20–29)
Calcium: 9.2 mg/dL (ref 8.6–10.2)
Chloride: 102 mmol/L (ref 96–106)
Creatinine, Ser: 0.71 mg/dL — ABNORMAL LOW (ref 0.76–1.27)
GFR calc Af Amer: 115 mL/min/{1.73_m2} (ref >59)
GFR calc non Af Amer: 99 mL/min/{1.73_m2} (ref >59)
Globulin, Total: 2.5 g/dL (ref 1.5–4.5)
Glucose: 201 mg/dL — ABNORMAL HIGH (ref 65–99)
Potassium: 4.8 mmol/L (ref 3.5–5.2)
Sodium: 146 mmol/L — ABNORMAL HIGH (ref 134–144)
Total Protein: 7.3 g/dL (ref 6.0–8.5)

## 2019-08-08 LAB — LIPID PANEL
Chol/HDL Ratio: 6.9 ratio — ABNORMAL HIGH (ref 0.0–5.0)
Cholesterol, Total: 151 mg/dL (ref 100–199)
HDL: 22 mg/dL — ABNORMAL LOW (ref >39)
LDL Chol Calc (NIH): 71 mg/dL (ref 0–99)
Triglycerides: 365 mg/dL — ABNORMAL HIGH (ref 0–149)
VLDL Cholesterol Cal: 58 mg/dL — ABNORMAL HIGH (ref 5–40)

## 2019-10-25 ENCOUNTER — Ambulatory Visit (INDEPENDENT_AMBULATORY_CARE_PROVIDER_SITE_OTHER): Payer: Medicare Other | Admitting: Family Medicine

## 2019-10-25 ENCOUNTER — Encounter: Payer: Self-pay | Admitting: Family Medicine

## 2019-10-25 ENCOUNTER — Other Ambulatory Visit: Payer: Self-pay

## 2019-10-25 VITALS — BP 139/82 | HR 71 | Temp 97.4°F | Ht 70.0 in | Wt 233.4 lb

## 2019-10-25 DIAGNOSIS — M5416 Radiculopathy, lumbar region: Secondary | ICD-10-CM | POA: Diagnosis not present

## 2019-10-25 MED ORDER — TIZANIDINE HCL 6 MG PO CAPS
6.0000 mg | ORAL_CAPSULE | Freq: Three times a day (TID) | ORAL | 1 refills | Status: DC | PRN
Start: 2019-10-25 — End: 2019-11-07

## 2019-10-25 MED ORDER — PREDNISONE 10 MG PO TABS: ORAL_TABLET | ORAL | 0 refills | Status: DC

## 2019-10-25 NOTE — Progress Notes (Signed)
Subjective:  Patient ID: Tyler Garner, male    DOB: 1955-08-20  Age: 64 y.o. MRN: [id number]  CC: Back Pain, Hip Pain, and Leg Pain   HPI Tyler Garner presents for low back pain radiating into the right hip buttocks and calf on the right side.  Onset about a year ago.  Occurs intermittently.  It is increasing in severity and frequency.  Pain is 8-9/10 for severity.  He does get some relief when he sits down to rest.  However if he gets up for just a few minutes the pain will come back.  Patient notes that he has a great deal of arthritis.  He uses the right hand as an example.  He says he can't  even make a fist because of arthritis in the hand.  Patient has not had any x-rays of any of these areas in the recent past.  No work-up has been done.  He is a diabetic whose fasting glucose usually runs about 150.  Depression screen [facility] 2/9 10/25/2019 05/05/2019 03/02/2019  Decreased Interest 0 0 0  Down, Depressed, Hopeless 0 0 0  PHQ - 2 Score 0 0 0  Altered sleeping - - -  Tired, decreased energy - - -  Change in appetite - - -  Feeling bad or failure about yourself  - - -  Trouble concentrating - - -  Moving slowly or fidgety/restless - - -  Suicidal thoughts - - -  PHQ-9 Score - - -  Some recent data might be hidden    History Tyler Garner has a past medical history of Arthritis, CAD (coronary artery disease), Diabetes mellitus without complication (HCC), Hyperglycemia, Hyperlipidemia, Hypertension, Ischemic cardiomyopathy, Nicotine addiction, Renal colic, and Right shoulder injury (10/11/09).   He has a past surgical history that includes  heart stent and left heart catheterization with coronary angiogram (N/A, 06/21/2013).   His family history includes ALS in his mother; Alzheimer's disease in his father; Diabetes in his brother and mother.He reports that he has quit smoking. He has quit using smokeless tobacco. He reports current alcohol use. He reports that he does not use  drugs.    ROS Review of Systems  Constitutional: Negative.   HENT: Negative.   Eyes: Negative for visual disturbance.  Respiratory: Negative for cough and shortness of breath.   Cardiovascular: Negative for chest pain and leg swelling.  Gastrointestinal: Negative for abdominal pain, diarrhea, nausea and vomiting.  Genitourinary: Negative for difficulty urinating.  Musculoskeletal: Positive for arthralgias, back pain and myalgias.  Skin: Negative for rash.  Neurological: Negative for headaches.  Psychiatric/Behavioral: Negative for sleep disturbance.    Objective:  BP 139/82   Pulse 71   Temp (!) 97.4 F (36.3 C) (Temporal)   Ht 5\' 10"  (1.778 m)   Wt 233 lb 6.4 oz (105.9 kg)   BMI 33.49 kg/m   BP Readings from Last 3 Encounters:  10/25/19 139/82  08/07/19 (!) 142/88  05/05/19 137/72    Wt Readings from Last 3 Encounters:  10/25/19 233 lb 6.4 oz (105.9 kg)  08/07/19 237 lb 6.4 oz (107.7 kg)  05/05/19 234 lb (106.1 kg)     Physical Exam Vitals reviewed.  Constitutional:      Appearance: He is well-developed.  HENT:     Head: Normocephalic and atraumatic.     Right Ear: External ear normal.     Left Ear: External ear normal.     Mouth/Throat:     Pharynx: No oropharyngeal  exudate or posterior oropharyngeal erythema.  Eyes:     Pupils: Pupils are equal, round, and reactive to light.  Cardiovascular:     Rate and Rhythm: Normal rate and regular rhythm.     Heart sounds: No murmur.  Pulmonary:     Effort: No respiratory distress.     Breath sounds: Normal breath sounds.  Musculoskeletal:        General: Swelling (In both hands particularly at the knuckles) present. Normal range of motion.     Cervical back: Normal range of motion and neck supple.  Neurological:     Mental Status: He is alert and oriented to person, place, and time.       Assessment & Plan:   Tyler Garner was seen today for back pain, hip pain and leg pain.  Diagnoses and all orders for  this visit:  Chronic lumbar radiculopathy -     Ambulatory referral to Physical Therapy -     DG Lumbar Spine 2-3 Views; Future -     DG HIP UNILAT W OR W/O PELVIS 2-3 VIEWS RIGHT; Future  Other orders -     predniSONE (DELTASONE) 10 MG tablet; Take 5 daily for 3 days followed by 4,3,2 and 1 for 3 days each. -     tizanidine (ZANAFLEX) 6 MG capsule; Take 1 capsule (6 mg total) by mouth 3 (three) times daily as needed for muscle spasms.       I am having Tyler Garner start on predniSONE and tizanidine. I am also having him maintain his aspirin, glucose blood, Pen Needles, metoprolol tartrate, amLODipine, lisinopril, rosuvastatin, Lantus SoloStar, glimepiride, Janumet, and meloxicam.  Allergies as of 10/25/2019      Reactions   Percocet [oxycodone-acetaminophen] Nausea And Vomiting      Medication List       Accurate as of October 25, 2019 11:59 PM. If you have any questions, ask your nurse or doctor.        amLODipine 5 MG tablet Commonly known as: NORVASC Take 1 tablet (5 mg total) by mouth daily.   aspirin 81 MG tablet Take 81 mg by mouth daily.   glimepiride 4 MG tablet Commonly known as: AMARYL Take 1 tablet (4 mg total) by mouth 2 (two) times daily.   glucose blood test strip Commonly known as: ACCU-CHEK ACTIVE STRIPS Check blood sugar daily and prn  Dx E11.9   Janumet 50-1000 MG tablet Generic drug: sitaGLIPtin-metformin Take 1 tablet by mouth 2 (two) times daily with a meal.   Lantus SoloStar 100 UNIT/ML Solostar Pen Generic drug: insulin glargine Inject 55 Units into the skin 2 (two) times daily.   lisinopril 10 MG tablet Commonly known as: ZESTRIL Take 1 tablet (10 mg total) by mouth daily.   meloxicam 15 MG tablet Commonly known as: MOBIC Take 1 tablet (15 mg total) by mouth daily.   metoprolol tartrate 25 MG tablet Commonly known as: LOPRESSOR Take 1 tablet (25 mg total) by mouth 2 (two) times daily.   Pen Needles 31G X 8 MM Misc Use with  lantus solosar pen daily   predniSONE 10 MG tablet Commonly known as: DELTASONE Take 5 daily for 3 days followed by 4,3,2 and 1 for 3 days each. Started by: Tyler Claude, MD   rosuvastatin 40 MG tablet Commonly known as: Crestor Take 0.5 tablets (20 mg total) by mouth daily.   tizanidine 6 MG capsule Commonly known as: ZANAFLEX Take 1 capsule (6 mg total) by mouth 3 (three)  times daily as needed for muscle spasms. Started by: Claretta Fraise, MD        Follow-up: Return in about 6 weeks (around 12/06/2019).  Claretta Fraise, M.D.

## 2019-10-27 ENCOUNTER — Ambulatory Visit (INDEPENDENT_AMBULATORY_CARE_PROVIDER_SITE_OTHER): Payer: Medicare Other

## 2019-10-27 ENCOUNTER — Other Ambulatory Visit: Payer: Self-pay

## 2019-10-27 ENCOUNTER — Other Ambulatory Visit: Payer: Medicare Other

## 2019-10-27 DIAGNOSIS — M5416 Radiculopathy, lumbar region: Secondary | ICD-10-CM

## 2019-10-27 DIAGNOSIS — M1611 Unilateral primary osteoarthritis, right hip: Secondary | ICD-10-CM | POA: Diagnosis not present

## 2019-10-27 DIAGNOSIS — M5137 Other intervertebral disc degeneration, lumbosacral region: Secondary | ICD-10-CM | POA: Diagnosis not present

## 2019-10-29 ENCOUNTER — Encounter: Payer: Self-pay | Admitting: Family Medicine

## 2019-10-30 ENCOUNTER — Other Ambulatory Visit: Payer: Self-pay

## 2019-10-30 ENCOUNTER — Encounter: Payer: Self-pay | Admitting: Physical Therapy

## 2019-10-30 ENCOUNTER — Ambulatory Visit: Payer: Medicare Other | Attending: Family Medicine | Admitting: Physical Therapy

## 2019-10-30 ENCOUNTER — Telehealth: Payer: Self-pay | Admitting: Nurse Practitioner

## 2019-10-30 DIAGNOSIS — M6281 Muscle weakness (generalized): Secondary | ICD-10-CM | POA: Diagnosis not present

## 2019-10-30 DIAGNOSIS — G8929 Other chronic pain: Secondary | ICD-10-CM | POA: Insufficient documentation

## 2019-10-30 DIAGNOSIS — R293 Abnormal posture: Secondary | ICD-10-CM | POA: Diagnosis not present

## 2019-10-30 DIAGNOSIS — M5441 Lumbago with sciatica, right side: Secondary | ICD-10-CM | POA: Diagnosis not present

## 2019-10-30 NOTE — Therapy (Signed)
[facility] [address] [place], [place], [address] Phone: [phone]   Fax:  [phone]  Physical Therapy Evaluation  Patient Details  Name: Tyler Garner MRN: [id number] Date of Birth: 12/10/1955 Referring Provider (PT): Mechele Claude, MD   Encounter Date: 10/30/2019  PT End of Session - 10/30/19 1556    Visit Number  1    Number of Visits  12    Date for PT Re-Evaluation  12/18/19    Authorization Type  Progress note every 10th visit; KX modifier at 15th visit    PT Start Time  0945    PT Stop Time  1028    PT Time Calculation (min)  43 min    Activity Tolerance  Patient tolerated treatment well    Behavior During Therapy  [facility] for tasks assessed/performed       Past Medical History:  Diagnosis Date  . Arthritis   . CAD (coronary artery disease)    a. inferior STEMI (05/2013) with RV involvement- LHC:  dist CFX occluded (Promus premier (2.5x24 mm) DES); EF 40-45% with inf HK;  b. Echo (report pending) with EF 45-50%, lat WMA, RVE, mod RV hypokinesis   . Diabetes mellitus without complication (HCC)   . Hyperglycemia   . Hyperlipidemia   . Hypertension   . Ischemic cardiomyopathy   . Nicotine addiction   . Renal colic   . Right shoulder injury 10/11/09    Past Surgical History:  Procedure Laterality Date  .  heart stent    . LEFT HEART CATHETERIZATION WITH CORONARY ANGIOGRAM N/A 06/21/2013   Procedure: LEFT HEART CATHETERIZATION WITH CORONARY ANGIOGRAM;  Surgeon: Peter M [place]az[place], MD;  Location: [facility] CATH LAB;  Service: Cardiovascular;  Laterality: N/A;    There were no vitals filed for this visit.   Subjective Assessment - 10/30/19 1549    Subjective  COVID-19 screening performed upon arrival.Patient arrives to physical therapy with reports of chronic low back pain, difficulty walking, and weakness that began after a slip and fall on ice about 1 year ago. Patient reports pain has progressively worsened and feels as though  his right LE does not move as well as the other. Patient reports daily numbness and tingling in the lower lateral aspect of his right knee to his lateral ankle. Patient reports ability to perform ADLs but with pain. Patient reports he lives a sedentary lifestyle secondary to the pain in his back and has not been able to perform recreational activity of hunting. Patient's pain at worst is 10/10 and pain at best is 0/10 with rest. Patient's goals are to decrease pain, improve movement, improve strength, improve ability to perform ADLs and return to [place] and deer hunting.    Pertinent History  HTN, DM, CAD    Limitations  Standing;Walking;House hold activities    How long can you sit comfortably?  unlimited    How long can you stand comfortably?  about 5-10 mins    How long can you walk comfortably?  without support 5-10 mins    Diagnostic tests  x-ray: see imaging; DDD L5-S1, mild arthropathy of B hips and B SIJ    Patient Stated Goals  decrease pain, improve movement, get back to hunting.    Currently in Pain?  Yes    Pain Score  4     Pain Location  Back    Pain Orientation  Lower    Pain Descriptors / Indicators  Sharp    Pain Type  Chronic pain    Pain Radiating Towards  R lateral lower leg    Pain Onset  More than a month ago    Pain Frequency  Intermittent    Aggravating Factors   "walking, standing"    Pain Relieving Factors  "being still"    Effect of Pain on Daily Activities  pain with activities; unable to perform recreational activities         [facility] PT Assessment - 10/30/19 0001      Assessment   Medical Diagnosis  Chronic lumbar radiculopathy    Referring Provider (PT)  Mechele Claude, MD    Onset Date/Surgical Date  --   ~one year   Next MD Visit  July 2021    Prior Therapy  no      Precautions   Precautions  None      Restrictions   Weight Bearing Restrictions  No      Balance Screen   Has the patient fallen in the past 6 months  Yes    How many times?  2     Has the patient had a decrease in activity level because of a fear of falling?   Yes    Is the patient reluctant to leave their home because of a fear of falling?   No      Home Public house manager residence    Living Arrangements  Alone;Children   Son lives with him part time     Prior Function   Level of Independence  Independent with basic ADLs    Leisure  Hunting      Observation/Other Assessments   Scoliosis  notable foward flexion and left trunk rotation      Sensation   Light Touch  Impaired by gross assessment   decreased sensation on lateral aspect lower leg,     Deep Tendon Reflexes   DTR Assessment Site  Patella    Patella DTR  2+   normal bilaterally     ROM / Strength   AROM / PROM / Strength  AROM;Strength      AROM   AROM Assessment Site  Lumbar    Lumbar Flexion  7" finger tip to floor    Lumbar Extension  18 deg from neutral    Lumbar - Right Side Bend  20.5" finger tip to floor    Lumbar - Left Side Bend  20.5" finger tip to floor      Strength   Overall Strength  Deficits    Strength Assessment Site  Hip;Knee    Right/Left Hip  Right;Left    Right Hip Flexion  3-/5    Left Hip Flexion  3+/5    Right/Left Knee  Right;Left    Right Knee Flexion  4-/5    Right Knee Extension  4/5    Left Knee Flexion  4/5    Left Knee Extension  4/5      Palpation   SI assessment   equal leg lengths upon assessment    Palpation comment  significant tone in bilateral thoracolumbar paraspinals, tenderness to bilateral upper glutes and middle R glute      Transfers   Comments  slow transfers with reports of pain; log roll for sidelying to sit.      Ambulation/Gait   Gait Pattern  Step-through pattern;Step-to pattern;Decreased stride length;Decreased stance time - right;Decreased step length - left;Decreased weight shift to right;Antalgic;Lateral trunk lean to left;Trunk rotated posteriorly on left;Trunk  flexed;Wide base of support;Right flexed  knee in stance;Left flexed knee in stance    Gait velocity  slow and cautious gait                  Objective measurements completed on examination: See above findings.              PT Education - 10/30/19 1555    Education Details  draw ins, hamstring stretch, trunk ext with rotation    Person(s) Educated  Patient    Methods  Explanation;Demonstration;Handout    Comprehension  Verbalized understanding;Returned demonstration          PT Long Term Goals - 10/30/19 1557      PT LONG TERM GOAL #1   Title  Patient will be independent with HEP    Time  6    Period  Weeks    Status  New      PT LONG TERM GOAL #2   Title  Patient will demonstrate improved trunk posture to neutral or greater with low back pain less than or equal to 3/10.    Time  6    Period  Weeks    Status  New      PT LONG TERM GOAL #3   Title  Patient will report ability to perform ADLs and household chores like laundry with low back pain less than or equal to 3/10.    Time  6    Period  Weeks    Status  New      PT LONG TERM GOAL #4   Title  Patient will demonstrat 4/5 or greater bilateral LE MMT to improve stability during functoinal tasks.    Time  6    Period  Weeks    Status  New      PT LONG TERM GOAL #5   Title  Patient will ability to walk for 20 mins or greater with support and minimal gait deviations to access community and perform grocery shopping.    Time  6    Period  Weeks    Status  New             Plan - 10/30/19 1608    Clinical Impression Statement  Patient is a 64 year old male who presents to physical therapy with chronic low back pain, decreased thoracolumbar ROM, and decreased bilateral LE MMT that began after slipping on ice about one year ago. Patient noted with posture in forward trunk flexion, L trunk rotation, and knees flexed in stance. Patient ambulates with an antalgic gait pattern and decreased right stance time. Patient and PT discussed plan  of care and HEP to maximize PT benefit. Patient reported understanding. Patient would benefit from skilled physical therapy to address deficits and address patient's goals.    Personal Factors and Comorbidities  Comorbidity 3+;Time since onset of injury/illness/exacerbation    Comorbidities  HTN, DM, CAD    Examination-Activity Limitations  Locomotion Level;Transfers;Stairs;Stand    Stability/Clinical Decision Making  Stable/Uncomplicated    Clinical Decision Making  Low    Rehab Potential  Fair    PT Frequency  2x / week    PT Duration  6 weeks    PT Treatment/Interventions  ADLs/Self Care Home Management;Cryotherapy;Electrical Stimulation;Iontophoresis 4mg /ml Dexamethasone;Moist Heat;Traction;Ultrasound;Neuromuscular re-education;Balance training;Therapeutic exercise;Therapeutic activities;Functional mobility training;Stair training;Gait training;Patient/family education;Taping;Passive range of motion;Dry needling;Manual techniques;Spinal Manipulations    PT Next Visit Plan  Nustep, core stabilization, STW/M to decrease tone of paraspinals, LE stretching. Modalities PRN  for pain relief.    PT Home Exercise Plan  see patient education section    Consulted and Agree with Plan of Care  Patient       Patient will benefit from skilled therapeutic intervention in order to improve the following deficits and impairments:  Abnormal gait, Decreased activity tolerance, Decreased strength, Difficulty walking, Postural dysfunction, Pain, Decreased range of motion  Visit Diagnosis: Chronic bilateral low back pain with right-sided sciatica  Muscle weakness (generalized)  Abnormal posture     Problem List Patient Active Problem List   Diagnosis Date Noted  . Morbid obesity ([place]) 05/24/2015  . Arthritic-like pain 10/25/2014  . Essential hypertension 07/18/2014  . Uncontrolled diabetes mellitus ([place]) 07/18/2014  . Coronary atherosclerosis of native coronary artery 06/24/2013  . Hx of myocardial  infarction 06/21/2013  . Hyperlipidemia 08/27/2010    Gabriela Eves, PT, DPT 10/30/2019, 4:10 PM  [facility] [address] [place], [place], [address] Phone: [phone]   Fax:  [phone]  Name: Tyler Garner MRN: [id number] Date of Birth: 05-Mar-1956

## 2019-10-30 NOTE — Telephone Encounter (Signed)
Pt returned missed call from [facility] regarding xray results. Reviewed results with pt per Dr Darlyn Read notes. Pt voiced understanding. Pt said the muscle relaxers he is currently taking has been helping. Will call the office is pain worsens or doesn't get any better.

## 2019-11-01 ENCOUNTER — Encounter: Payer: Self-pay | Admitting: Physical Therapy

## 2019-11-01 ENCOUNTER — Other Ambulatory Visit: Payer: Self-pay

## 2019-11-01 ENCOUNTER — Ambulatory Visit: Payer: Medicare Other | Admitting: Physical Therapy

## 2019-11-01 DIAGNOSIS — M6281 Muscle weakness (generalized): Secondary | ICD-10-CM

## 2019-11-01 DIAGNOSIS — G8929 Other chronic pain: Secondary | ICD-10-CM

## 2019-11-01 DIAGNOSIS — R293 Abnormal posture: Secondary | ICD-10-CM

## 2019-11-01 DIAGNOSIS — M5441 Lumbago with sciatica, right side: Secondary | ICD-10-CM | POA: Diagnosis not present

## 2019-11-01 NOTE — Therapy (Signed)
[facility] [address] [place], [place], [address] Phone: [phone]   Fax:  [phone]  Physical Therapy Treatment  Patient Details  Name: Tyler Garner MRN: [id number] Date of Birth: 1956-04-04 Referring Provider (PT): Mechele Claude, MD   Encounter Date: 11/01/2019  PT End of Session - 11/01/19 0819    Visit Number  2    Number of Visits  12    Date for PT Re-Evaluation  12/18/19    Authorization Type  Progress note every 10th visit; KX modifier at 15th visit    PT Start Time  0815    PT Stop Time  0856    PT Time Calculation (min)  41 min    Activity Tolerance  Patient tolerated treatment well    Behavior During Therapy  [facility] for tasks assessed/performed       Past Medical History:  Diagnosis Date  . Arthritis   . CAD (coronary artery disease)    a. inferior STEMI (05/2013) with RV involvement- LHC:  dist CFX occluded (Promus premier (2.5x24 mm) DES); EF 40-45% with inf HK;  b. Echo (report pending) with EF 45-50%, lat WMA, RVE, mod RV hypokinesis   . Diabetes mellitus without complication (HCC)   . Hyperglycemia   . Hyperlipidemia   . Hypertension   . Ischemic cardiomyopathy   . Nicotine addiction   . Renal colic   . Right shoulder injury 10/11/09    Past Surgical History:  Procedure Laterality Date  .  heart stent    . LEFT HEART CATHETERIZATION WITH CORONARY ANGIOGRAM N/A 06/21/2013   Procedure: LEFT HEART CATHETERIZATION WITH CORONARY ANGIOGRAM;  Surgeon: Peter M Swaz[place]and, MD;  Location: [facility] CATH LAB;  Service: Cardiovascular;  Laterality: N/A;    There were no vitals filed for this visit.  Subjective Assessment - 11/01/19 0817    Subjective  COVID 19 screening performed on patient upon arrival. Patient states that pain is primarily from R knee to R ankle this morning.    Pertinent History  HTN, DM, CAD    Limitations  Standing;Walking;House hold activities    How long can you sit comfortably?  unlimited    How  long can you stand comfortably?  about 5-10 mins    How long can you walk comfortably?  without support 5-10 mins    Diagnostic tests  x-ray: see imaging; DDD L5-S1, mild arthropathy of B hips and B SIJ    Patient Stated Goals  decrease pain, improve movement, get back to hunting.    Currently in Pain?  Yes    Pain Score  2     Pain Location  Leg    Pain Orientation  Right;Distal    Pain Descriptors / Indicators  Discomfort    Pain Type  Chronic pain    Pain Onset  More than a month ago    Pain Frequency  Intermittent         [facility] PT Assessment - 11/01/19 0001      Assessment   Medical Diagnosis  Chronic lumbar radiculopathy    Referring Provider (PT)  Mechele Claude, MD    Next MD Visit  July 2021    Prior Therapy  no      Precautions   Precautions  None      Restrictions   Weight Bearing Restrictions  No                    [facility] Adult PT Treatment/Exercise -  11/01/19 0001      Exercises   Exercises  Lumbar      Lumbar Exercises: Stretches   Passive Hamstring Stretch  Right;3 reps;30 seconds    Lower Trunk Rotation  4 reps;10 seconds    Figure 4 Stretch  3 reps;30 seconds;Supine;With overpressure      Lumbar Exercises: Aerobic   Nustep  L2, seat 10 x10 min      Lumbar Exercises: Standing   Shoulder Extension  Strengthening;Both;5 reps;Limitations    Shoulder Extension Limitations  Green XTS      Lumbar Exercises: Seated   Other Seated Lumbar Exercises  Lumbar extension x15 reps with focus on proper technique      Modalities   Modalities  Electrical Stimulation;Moist Heat      Moist Heat Therapy   Number Minutes Moist Heat  10 Minutes    Moist Heat Location  Lumbar Spine      Electrical Stimulation   Electrical Stimulation Location  B low back    Electrical Stimulation Action  Pre-Mod    Electrical Stimulation Parameters  80-150 hz x10 min    Electrical Stimulation Goals  Pain                  PT Long Term Goals - 10/30/19 1557       PT LONG TERM GOAL #1   Title  Patient will be independent with HEP    Time  6    Period  Weeks    Status  New      PT LONG TERM GOAL #2   Title  Patient will demonstrate improved trunk posture to neutral or greater with low back pain less than or equal to 3/10.    Time  6    Period  Weeks    Status  New      PT LONG TERM GOAL #3   Title  Patient will report ability to perform ADLs and household chores like laundry with low back pain less than or equal to 3/10.    Time  6    Period  Weeks    Status  New      PT LONG TERM GOAL #4   Title  Patient will demonstrat 4/5 or greater bilateral LE MMT to improve stability during functoinal tasks.    Time  6    Period  Weeks    Status  New      PT LONG TERM GOAL #5   Title  Patient will ability to walk for 20 mins or greater with support and minimal gait deviations to access community and perform grocery shopping.    Time  6    Period  Weeks    Status  New            Plan - 11/01/19 0955    Clinical Impression Statement  Patient presented in clinic with trunk flexion and L rotation posture. Patient introduced to low level posture correction exercises with limitations due to pain. Patient very rigid with lumbar extension today even with multimodal cueing for correction. Patient limited with tolerance of supine position due to pain. Patient encouraged to continue HEP for PT progression in clinic. Passive stretching completed today as well to reduce muscle guarding and pain. Normal modalities response noted following removal of the modalities in sitting for comfort.    Personal Factors and Comorbidities  Comorbidity 3+;Time since onset of injury/illness/exacerbation    Comorbidities  HTN, DM, CAD    Examination-Activity Limitations  Locomotion Level;Transfers;Stairs;Stand    Stability/Clinical Decision Making  Stable/Uncomplicated    Rehab Potential  Fair    PT Frequency  2x / week    PT Duration  6 weeks    PT  Treatment/Interventions  ADLs/Self Care Home Management;Cryotherapy;Electrical Stimulation;Iontophoresis 4mg /ml Dexamethasone;Moist Heat;Traction;Ultrasound;Neuromuscular re-education;Balance training;Therapeutic exercise;Therapeutic activities;Functional mobility training;Stair training;Gait training;Patient/family education;Taping;Passive range of motion;Dry needling;Manual techniques;Spinal Manipulations    PT Next Visit Plan  Nustep, core stabilization, STW/M to decrease tone of paraspinals, LE stretching. Modalities PRN for pain relief.    PT Home Exercise Plan  see patient education section    Consulted and Agree with Plan of Care  Patient       Patient will benefit from skilled therapeutic intervention in order to improve the following deficits and impairments:  Abnormal gait, Decreased activity tolerance, Decreased strength, Difficulty walking, Postural dysfunction, Pain, Decreased range of motion  Visit Diagnosis: Chronic bilateral low back pain with right-sided sciatica  Muscle weakness (generalized)  Abnormal posture     Problem List Patient Active Problem List   Diagnosis Date Noted  . Morbid obesity ([place]) 05/24/2015  . Arthritic-like pain 10/25/2014  . Essential hypertension 07/18/2014  . Uncontrolled diabetes mellitus ([place]) 07/18/2014  . Coronary atherosclerosis of native coronary artery 06/24/2013  . Hx of myocardial infarction 06/21/2013  . Hyperlipidemia 08/27/2010    Standley Brooking, PTA 11/01/2019, 10:11 AM  [facility] [address] [place], [place], [address] Phone: [phone]   Fax:  [phone]  Name: Tyler Garner MRN: [id number] Date of Birth: 1956/03/01

## 2019-11-05 ENCOUNTER — Other Ambulatory Visit: Payer: Self-pay | Admitting: Nurse Practitioner

## 2019-11-05 DIAGNOSIS — I25111 Atherosclerotic heart disease of native coronary artery with angina pectoris with documented spasm: Secondary | ICD-10-CM

## 2019-11-06 ENCOUNTER — Telehealth: Payer: Self-pay

## 2019-11-06 ENCOUNTER — Other Ambulatory Visit: Payer: Self-pay

## 2019-11-06 ENCOUNTER — Ambulatory Visit: Payer: Medicare Other | Admitting: Physical Therapy

## 2019-11-06 ENCOUNTER — Encounter: Payer: Self-pay | Admitting: Physical Therapy

## 2019-11-06 DIAGNOSIS — G8929 Other chronic pain: Secondary | ICD-10-CM | POA: Diagnosis not present

## 2019-11-06 DIAGNOSIS — R293 Abnormal posture: Secondary | ICD-10-CM | POA: Diagnosis not present

## 2019-11-06 DIAGNOSIS — M6281 Muscle weakness (generalized): Secondary | ICD-10-CM

## 2019-11-06 DIAGNOSIS — M5441 Lumbago with sciatica, right side: Secondary | ICD-10-CM | POA: Diagnosis not present

## 2019-11-06 NOTE — Telephone Encounter (Signed)
I will defer this 1 to you Gennette Pac

## 2019-11-06 NOTE — Therapy (Addendum)
[facility] [place], [place], [address] Phone: [phone]   Fax:  [phone]  Physical Therapy Treatment PHYSICAL THERAPY DISCHARGE SUMMARY  Visits from Start of Care: 3  Current functional level related to goals / functional outcomes: See below   Remaining deficits: See goals   Education / Equipment: HEP Plan: Patient agrees to discharge.  Patient goals were not met. Patient is being discharged due to not returning since the last visit.  ?????    Gabriela Eves, PT, DPT 05/02/20    Patient Details  Name: Tyler Garner MRN: [id number] Date of Birth: 1955-05-28 Referring Provider (PT): Claretta Fraise, MD   Encounter Date: 11/06/2019   PT End of Session - 11/06/19 0824    Visit Number 3    Number of Visits 12    Date for PT Re-Evaluation 12/18/19    Authorization Type Progress note every 10th visit; KX modifier at 15th visit    PT Start Time 0816    PT Stop Time 0859    PT Time Calculation (min) 43 min    Activity Tolerance Patient tolerated treatment well    Behavior During Therapy [facility] for tasks assessed/performed           Past Medical History:  Diagnosis Date  . Arthritis   . CAD (coronary artery disease)    a. inferior STEMI (05/2013) with RV involvement- LHC:  dist CFX occluded (Promus premier (2.5x24 mm) DES); EF 40-45% with inf HK;  b. Echo (report pending) with EF 45-50%, lat WMA, RVE, mod RV hypokinesis   . Diabetes mellitus without complication ([place])   . Hyperglycemia   . Hyperlipidemia   . Hypertension   . Ischemic cardiomyopathy   . Nicotine addiction   . Renal colic   . Right shoulder injury 10/11/09    Past Surgical History:  Procedure Laterality Date  .  heart stent    . LEFT HEART CATHETERIZATION WITH CORONARY ANGIOGRAM N/A 06/21/2013   Procedure: LEFT HEART CATHETERIZATION WITH CORONARY ANGIOGRAM;  Surgeon: Peter M Martinique, MD;  Location: [facility] CATH LAB;  Service: Cardiovascular;   Laterality: N/A;    There were no vitals filed for this visit.   Subjective Assessment - 11/06/19 0818    Subjective COVID 19 screening performed on patient upon arrival. Patient reports some soreness today.    Pertinent History HTN, DM, CAD    Limitations Standing;Walking;House hold activities    How long can you sit comfortably? unlimited    How long can you stand comfortably? about 5-10 mins    How long can you walk comfortably? without support 5-10 mins    Diagnostic tests x-ray: see imaging; DDD L5-S1, mild arthropathy of B hips and B SIJ    Patient Stated Goals decrease pain, improve movement, get back to hunting.    Currently in Pain? Yes    Pain Score --   No pain score provided   Pain Location Generalized    Pain Descriptors / Indicators Sore    Pain Type Chronic pain    Pain Onset More than a month ago    Pain Frequency Intermittent              [facility] PT Assessment - 11/06/19 0001      Assessment   Medical Diagnosis Chronic lumbar radiculopathy    Referring Provider (PT) Claretta Fraise, MD    Next MD Visit July 2021    Prior Therapy no      Precautions  Precautions None      Restrictions   Weight Bearing Restrictions No                         [facility] Adult PT Treatment/Exercise - 11/06/19 0001      Lumbar Exercises: Stretches   Passive Hamstring Stretch Right;3 reps;30 seconds    Single Knee to Chest Stretch Right;3 reps;30 seconds      Lumbar Exercises: Aerobic   Nustep L3, seat 9 x10 min      Lumbar Exercises: Seated   Other Seated Lumbar Exercises Lumbar extension x15 reps with focus on proper technique    Other Seated Lumbar Exercises Row green theraband x20 reps      Lumbar Exercises: Supine   Bridge 15 reps;2 seconds      Modalities   Modalities Electrical Stimulation;Moist Heat      Moist Heat Therapy   Number Minutes Moist Heat 15 Minutes    Moist Heat Location Lumbar Spine      Electrical Stimulation   Electrical  Stimulation Location B low back    Electrical Stimulation Action IFC    Electrical Stimulation Parameters 80-150 hz x15 min    Electrical Stimulation Goals Pain                       PT Long Term Goals - 10/30/19 1557      PT LONG TERM GOAL #1   Title Patient will be independent with HEP    Time 6    Period Weeks    Status New      PT LONG TERM GOAL #2   Title Patient will demonstrate improved trunk posture to neutral or greater with low back pain less than or equal to 3/10.    Time 6    Period Weeks    Status New      PT LONG TERM GOAL #3   Title Patient will report ability to perform ADLs and household chores like laundry with low back pain less than or equal to 3/10.    Time 6    Period Weeks    Status New      PT LONG TERM GOAL #4   Title Patient will demonstrat 4/5 or greater bilateral LE MMT to improve stability during functoinal tasks.    Time 6    Period Weeks    Status New      PT LONG TERM GOAL #5   Title Patient will ability to walk for 20 mins or greater with support and minimal gait deviations to access community and perform grocery shopping.    Time 6    Period Weeks    Status New                 Plan - 11/06/19 0943    Clinical Impression Statement Patient presented in clinic with reports of soreness generally. Patient states that he will not use muscle relaxer again as he slept and he does not like the way it makes him feel. Patient guided through postural corrective exercises with resistance. Patient still limited with supine exercises due to pain and discomfort. Patient reported some discomfort with bridges today. Normal modalities response noted following removal of the modalities in sitting.    Personal Factors and Comorbidities Comorbidity 3+;Time since onset of injury/illness/exacerbation    Comorbidities HTN, DM, CAD    Examination-Activity Limitations Locomotion Level;Transfers;Stairs;Stand    Stability/Clinical Decision Making  Stable/Uncomplicated  Rehab Potential Fair    PT Frequency 2x / week    PT Duration 6 weeks    PT Treatment/Interventions ADLs/Self Care Home Management;Cryotherapy;Electrical Stimulation;Iontophoresis 57m/ml Dexamethasone;Moist Heat;Traction;Ultrasound;Neuromuscular re-education;Balance training;Therapeutic exercise;Therapeutic activities;Functional mobility training;Stair training;Gait training;Patient/family education;Taping;Passive range of motion;Dry needling;Manual techniques;Spinal Manipulations    PT Next Visit Plan Nustep, core stabilization, STW/M to decrease tone of paraspinals, LE stretching. Modalities PRN for pain relief.    PT Home Exercise Plan see patient education section    Consulted and Agree with Plan of Care Patient           Patient will benefit from skilled therapeutic intervention in order to improve the following deficits and impairments:  Abnormal gait, Decreased activity tolerance, Decreased strength, Difficulty walking, Postural dysfunction, Pain, Decreased range of motion  Visit Diagnosis: Chronic bilateral low back pain with right-sided sciatica  Muscle weakness (generalized)  Abnormal posture     Problem List Patient Active Problem List   Diagnosis Date Noted  . Morbid obesity ([place] 05/24/2015  . Arthritic-like pain 10/25/2014  . Essential hypertension 07/18/2014  . Uncontrolled diabetes mellitus ([place] 07/18/2014  . Coronary atherosclerosis of native coronary artery 06/24/2013  . Hx of myocardial infarction 06/21/2013  . Hyperlipidemia 08/27/2010    KStandley Brooking PTA 11/06/2019, 9:46 AM  [facility] [address]Phone: [phone]  Fax:  [phone] Name: Tyler BURKLANDMRN: [id number] of Birth: [birth year]

## 2019-11-06 NOTE — Telephone Encounter (Signed)
Patient's insurance will not cover Tizanidine 6 mg capsules that were prescribed.  They will cover Tizanidine 2 mg tablets.

## 2019-11-07 MED ORDER — METHOCARBAMOL 500 MG PO TABS: 500.0000 mg | ORAL_TABLET | Freq: Two times a day (BID) | ORAL | 1 refills | Status: DC

## 2019-11-07 MED ORDER — TIZANIDINE HCL 2 MG PO TABS: 4.0000 mg | ORAL_TABLET | Freq: Three times a day (TID) | ORAL | 1 refills | Status: DC

## 2019-11-07 NOTE — Telephone Encounter (Signed)
Lmtcb.

## 2019-11-07 NOTE — Telephone Encounter (Signed)
Left message to call back  

## 2019-11-07 NOTE — Telephone Encounter (Signed)
Patient reports he got the Tizanidine 6 mg and paid cash.  He has tried taking them but says he is unable to because they "make him feel drunk."  He is wondering if there is something else he can try instead.

## 2019-11-07 NOTE — Telephone Encounter (Signed)
Due to insurance, I had to change tizanidine dose from 6mg  to 2 mg- he can take 2 3x a day as needed.

## 2019-11-07 NOTE — Addendum Note (Signed)
Addended by: Bennie Pierini on: 11/07/2019 08:05 AM   Modules accepted: Orders

## 2019-11-07 NOTE — Telephone Encounter (Signed)
Will try robaxin- make sure does not pick up rx for tizanidine

## 2019-11-07 NOTE — Addendum Note (Signed)
Addended by: Bennie Pierini on: 11/07/2019 01:46 PM   Modules accepted: Orders

## 2019-11-16 ENCOUNTER — Ambulatory Visit (INDEPENDENT_AMBULATORY_CARE_PROVIDER_SITE_OTHER): Payer: Medicare Other | Admitting: Nurse Practitioner

## 2019-11-16 ENCOUNTER — Encounter: Payer: Self-pay | Admitting: Nurse Practitioner

## 2019-11-16 ENCOUNTER — Other Ambulatory Visit: Payer: Self-pay

## 2019-11-16 VITALS — BP 131/71 | HR 74 | Temp 97.1°F | Resp 20 | Ht 70.0 in | Wt 232.0 lb

## 2019-11-16 DIAGNOSIS — E782 Mixed hyperlipidemia: Secondary | ICD-10-CM | POA: Diagnosis not present

## 2019-11-16 DIAGNOSIS — I25111 Atherosclerotic heart disease of native coronary artery with angina pectoris with documented spasm: Secondary | ICD-10-CM | POA: Diagnosis not present

## 2019-11-16 DIAGNOSIS — I10 Essential (primary) hypertension: Secondary | ICD-10-CM | POA: Diagnosis not present

## 2019-11-16 DIAGNOSIS — Z125 Encounter for screening for malignant neoplasm of prostate: Secondary | ICD-10-CM

## 2019-11-16 DIAGNOSIS — E1165 Type 2 diabetes mellitus with hyperglycemia: Secondary | ICD-10-CM

## 2019-11-16 LAB — BAYER DCA HB A1C WAIVED: HB A1C (BAYER DCA - WAIVED): 7.9 % — ABNORMAL HIGH (ref <7.0)

## 2019-11-16 MED ORDER — METOPROLOL TARTRATE 25 MG PO TABS: 25.0000 mg | ORAL_TABLET | Freq: Two times a day (BID) | ORAL | 1 refills | Status: DC

## 2019-11-16 MED ORDER — AMLODIPINE BESYLATE 5 MG PO TABS: 5.0000 mg | ORAL_TABLET | Freq: Every day | ORAL | 1 refills | Status: DC

## 2019-11-16 MED ORDER — ROSUVASTATIN CALCIUM 40 MG PO TABS: 20.0000 mg | ORAL_TABLET | Freq: Every day | ORAL | 5 refills | Status: DC

## 2019-11-16 MED ORDER — GLIMEPIRIDE 4 MG PO TABS: 4.0000 mg | ORAL_TABLET | Freq: Two times a day (BID) | ORAL | 1 refills | Status: DC

## 2019-11-16 MED ORDER — LISINOPRIL 10 MG PO TABS: 10.0000 mg | ORAL_TABLET | Freq: Every day | ORAL | 11 refills | Status: DC

## 2019-11-16 MED ORDER — LANTUS SOLOSTAR 100 UNIT/ML ~~LOC~~ SOPN: 60.0000 [IU] | PEN_INJECTOR | Freq: Two times a day (BID) | SUBCUTANEOUS | 99 refills | Status: DC

## 2019-11-16 MED ORDER — JANUMET 50-1000 MG PO TABS: 1.0000 | ORAL_TABLET | Freq: Two times a day (BID) | ORAL | 1 refills | Status: DC

## 2019-11-16 NOTE — Patient Instructions (Signed)
Diabetes Mellitus and Foot Care Foot care is an important part of your health, especially when you have diabetes. Diabetes may cause you to have problems because of poor blood flow (circulation) to your feet and legs, which can cause your skin to:  Become thinner and drier.  Break more easily.  Heal more slowly.  Peel and crack. You may also have nerve damage (neuropathy) in your legs and feet, causing decreased feeling in them. This means that you may not notice minor injuries to your feet that could lead to more serious problems. Noticing and addressing any potential problems early is the best way to prevent future foot problems. How to care for your feet Foot hygiene  Wash your feet daily with warm water and mild soap. Do not use hot water. Then, pat your feet and the areas between your toes until they are completely dry. Do not soak your feet as this can dry your skin.  Trim your toenails straight across. Do not dig under them or around the cuticle. File the edges of your nails with an emery board or nail file.  Apply a moisturizing lotion or petroleum jelly to the skin on your feet and to dry, brittle toenails. Use lotion that does not contain alcohol and is unscented. Do not apply lotion between your toes. Shoes and socks  Wear clean socks or stockings every day. Make sure they are not too tight. Do not wear knee-high stockings since they may decrease blood flow to your legs.  Wear shoes that fit properly and have enough cushioning. Always look in your shoes before you put them on to be sure there are no objects inside.  To break in new shoes, wear them for just a few hours a day. This prevents injuries on your feet. Wounds, scrapes, corns, and calluses  Check your feet daily for blisters, cuts, bruises, sores, and redness. If you cannot see the bottom of your feet, use a mirror or ask someone for help.  Do not cut corns or calluses or try to remove them with medicine.  If you  find a minor scrape, cut, or break in the skin on your feet, keep it and the skin around it clean and dry. You may clean these areas with mild soap and water. Do not clean the area with peroxide, alcohol, or iodine.  If you have a wound, scrape, corn, or callus on your foot, look at it several times a day to make sure it is healing and not infected. Check for: ? Redness, swelling, or pain. ? Fluid or blood. ? Warmth. ? Pus or a bad smell. General instructions  Do not cross your legs. This may decrease blood flow to your feet.  Do not use heating pads or hot water bottles on your feet. They may burn your skin. If you have lost feeling in your feet or legs, you may not know this is happening until it is too late.  Protect your feet from hot and cold by wearing shoes, such as at the beach or on hot pavement.  Schedule a complete foot exam at least once a year (annually) or more often if you have foot problems. If you have foot problems, report any cuts, sores, or bruises to your health care provider immediately. Contact a health care provider if:  You have a medical condition that increases your risk of infection and you have any cuts, sores, or bruises on your feet.  You have an injury that is not   healing.  You have redness on your legs or feet.  You feel burning or tingling in your legs or feet.  You have pain or cramps in your legs and feet.  Your legs or feet are numb.  Your feet always feel cold.  You have pain around a toenail. Get help right away if:  You have a wound, scrape, corn, or callus on your foot and: ? You have pain, swelling, or redness that gets worse. ? You have fluid or blood coming from the wound, scrape, corn, or callus. ? Your wound, scrape, corn, or callus feels warm to the touch. ? You have pus or a bad smell coming from the wound, scrape, corn, or callus. ? You have a fever. ? You have a red line going up your leg. Summary  Check your feet every day  for cuts, sores, red spots, swelling, and blisters.  Moisturize feet and legs daily.  Wear shoes that fit properly and have enough cushioning.  If you have foot problems, report any cuts, sores, or bruises to your health care provider immediately.  Schedule a complete foot exam at least once a year (annually) or more often if you have foot problems. This information is not intended to replace advice given to you by your health care provider. Make sure you discuss any questions you have with your health care provider. Document Revised: 02/01/2019 Document Reviewed: 06/12/2016 Elsevier Patient Education  2020 [employer]  

## 2019-11-16 NOTE — Progress Notes (Signed)
Subjective:    Patient ID: Tyler Garner, male    DOB: [birth year], 64 y.o.   MRN: [id number]   Chief Complaint: Medical Management of Chronic Issues    HPI:  1. Type 2 diabetes mellitus with hyperglycemia, without long-term current use of insulin ([place]) Patient is not very compliant with diet or exercise. He does not check blood sugars. deneis any symptoms of low blood sugar. Lab Results  Component Value Date   HGBA1C 8.4 (H) 08/07/2019     2. Essential hypertension No c/o chest pain, sob or headache. Doe snot check blood pressure at home. BP Readings from Last 3 Encounters:  11/16/19 131/71  10/25/19 139/82  08/07/19 (!) 142/88     3. Mixed hyperlipidemia Does not watch diet and does very little exercise. Lab Results  Component Value Date   CHOL 151 08/07/2019   HDL 22 (L) 08/07/2019   LDLCALC 71 08/07/2019   TRIG 365 (H) 08/07/2019   CHOLHDL 6.9 (H) 08/07/2019     4. Screening for prostate cancer Lab Results  Component Value Date   PSA 0.68 12/05/2012      5. Atherosclerosis of native coronary artery of native heart with angina pectoris with documented spasm (HCC) No c/o chest pain, or SOB   6. Morbid obesity ([place]) No recent weight changes Wt Readings from Last 3 Encounters:  11/16/19 232 lb (105.2 kg)  10/25/19 233 lb 6.4 oz (105.9 kg)  08/07/19 237 lb 6.4 oz (107.7 kg)   BMI Readings from Last 3 Encounters:  11/16/19 33.29 kg/m  10/25/19 33.49 kg/m  08/07/19 34.06 kg/m       Outpatient Encounter Medications as of 11/16/2019  Medication Sig  . amLODipine (NORVASC) 5 MG tablet Take 1 tablet (5 mg total) by mouth daily.  Marland Kitchen aspirin 81 MG tablet Take 81 mg by mouth daily.    Marland Kitchen glimepiride (AMARYL) 4 MG tablet Take 1 tablet (4 mg total) by mouth 2 (two) times daily.  Marland Kitchen glucose blood (ACCU-CHEK ACTIVE STRIPS) test strip Check blood sugar daily and prn  Dx E11.9  . insulin glargine (LANTUS SOLOSTAR) 100 UNIT/ML Solostar Pen Inject 55 Units  into the skin 2 (two) times daily.  . Insulin Pen Needle (PEN NEEDLES) 31G X 8 MM MISC Use with lantus solosar pen daily  . lisinopril (ZESTRIL) 10 MG tablet Take 1 tablet (10 mg total) by mouth daily.  . meloxicam (MOBIC) 15 MG tablet Take 1 tablet (15 mg total) by mouth daily.  . metoprolol tartrate (LOPRESSOR) 25 MG tablet TAKE 1 TABLET BY MOUTH TWICE A DAY  . rosuvastatin (CRESTOR) 40 MG tablet Take 0.5 tablets (20 mg total) by mouth daily.  . sitaGLIPtin-metformin (JANUMET) 50-1000 MG tablet Take 1 tablet by mouth 2 (two) times daily with a meal.     Past Surgical History:  Procedure Laterality Date  .  heart stent    . LEFT HEART CATHETERIZATION WITH CORONARY ANGIOGRAM N/A 06/21/2013   Procedure: LEFT HEART CATHETERIZATION WITH CORONARY ANGIOGRAM;  Surgeon: Peter M Martinique, MD;  Location: [facility] CATH LAB;  Service: Cardiovascular;  Laterality: N/A;    Family History  Problem Relation Age of Onset  . Diabetes Mother   . ALS Mother   . Alzheimer's disease Father   . Diabetes Brother     New complaints: None today  Social history: His son lives with him  Controlled substance contract: n/a    Review of Systems  Constitutional: Negative for diaphoresis.  Eyes: Negative  for pain.  Respiratory: Negative for shortness of breath.   Cardiovascular: Negative for chest pain, palpitations and leg swelling.  Gastrointestinal: Negative for abdominal pain.  Endocrine: Negative for polydipsia.  Skin: Negative for rash.  Neurological: Negative for dizziness, weakness and headaches.  Hematological: Does not bruise/bleed easily.  All other systems reviewed and are negative.      Objective:   Physical Exam Vitals and nursing note reviewed.  Constitutional:      Appearance: Normal appearance. He is well-developed.  HENT:     Head: Normocephalic.     Nose: Nose normal.  Eyes:     Pupils: Pupils are equal, round, and reactive to light.  Neck:     Thyroid: No thyroid mass or  thyromegaly.     Vascular: No carotid bruit or JVD.     Trachea: Phonation normal.  Cardiovascular:     Rate and Rhythm: Normal rate and regular rhythm.  Pulmonary:     Effort: Pulmonary effort is normal. No respiratory distress.     Breath sounds: Normal breath sounds.  Abdominal:     General: Bowel sounds are normal.     Palpations: Abdomen is soft.     Tenderness: There is no abdominal tenderness.  Musculoskeletal:        General: Normal range of motion.     Cervical back: Normal range of motion and neck supple.  Lymphadenopathy:     Cervical: No cervical adenopathy.  Skin:    General: Skin is warm and dry.  Neurological:     Mental Status: He is alert and oriented to person, place, and time.  Psychiatric:        Behavior: Behavior normal.        Thought Content: Thought content normal.        Judgment: Judgment normal.    BP 131/71   Pulse 74   Temp (!) 97.1 F (36.2 C) (Temporal)   Resp 20   Ht _0  (1.778 m)   Wt 232 lb (105.2 kg)   SpO2 98%   BMI 33.29 kg/m   hgba1c 7.9%      Assessment & Plan:  SHRIYAN ARAKAWA comes in today with chief complaint of Medical Management of Chronic Issues   Diagnosis and orders addressed:  1. Type 2 diabetes mellitus with hyperglycemia, without long-term current use of insulin (HCC) Continue to watch carbs in diet Increase lantus to 60u daily - Bayer DCA Hb A1c Waived - insulin glargine (LANTUS SOLOSTAR) 100 UNIT/ML Solostar Pen; Inject 60 Units into the skin 2 (two) times daily.  Dispense: 10 pen; Refill: PRN - glimepiride (AMARYL) 4 MG tablet; Take 1 tablet (4 mg total) by mouth 2 (two) times daily.  Dispense: 180 tablet; Refill: 1 - sitaGLIPtin-metformin (JANUMET) 50-1000 MG tablet; Take 1 tablet by mouth 2 (two) times daily with a meal.  Dispense: 180 tablet; Refill: 1  2. Essential hypertension Low sodium diet - CBC with Differential/Platelet - CMP14+EGFR - amLODipine (NORVASC) 5 MG tablet; Take 1 tablet (5 mg  total) by mouth daily.  Dispense: 90 tablet; Refill: 1 - lisinopril (ZESTRIL) 10 MG tablet; Take 1 tablet (10 mg total) by mouth daily.  Dispense: 30 tablet; Refill: 11  3. Mixed hyperlipidemia Low fat diet - Lipid panel - rosuvastatin (CRESTOR) 40 MG tablet; Take 0.5 tablets (20 mg total) by mouth daily.  Dispense: 45 tablet; Refill: 5  4. Screening for prostate cancer - PSA, total and free  5. Atherosclerosis of native coronary  artery of native heart with angina pectoris with documented spasm (HCC) - metoprolol tartrate (LOPRESSOR) 25 MG tablet; Take 1 tablet (25 mg total) by mouth 2 (two) times daily.  Dispense: 180 tablet; Refill: 1  6. Morbid obesity ([place]) Discussed diet and exercise for person with BMI >25 Will recheck weight in 3-6 months    Labs pending Health Maintenance reviewed Diet and exercise encouraged  Follow up plan: 3 months   [name] Hassell Done, [name]

## 2019-11-17 LAB — CMP14+EGFR
ALT: 17 IU/L (ref 0–44)
AST: 15 IU/L (ref 0–40)
Albumin/Globulin Ratio: 1.8 (ref 1.2–2.2)
Albumin: 4.6 g/dL (ref 3.8–4.8)
Alkaline Phosphatase: 53 IU/L (ref 48–121)
BUN/Creatinine Ratio: 21 (ref 10–24)
BUN: 16 mg/dL (ref 8–27)
Bilirubin Total: 0.3 mg/dL (ref 0.0–1.2)
CO2: 25 mmol/L (ref 20–29)
Calcium: 9.9 mg/dL (ref 8.6–10.2)
Chloride: 98 mmol/L (ref 96–106)
Creatinine, Ser: 0.75 mg/dL — ABNORMAL LOW (ref 0.76–1.27)
GFR calc Af Amer: 111 mL/min/{1.73_m2} (ref >59)
GFR calc non Af Amer: 96 mL/min/{1.73_m2} (ref >59)
Globulin, Total: 2.6 g/dL (ref 1.5–4.5)
Glucose: 186 mg/dL — ABNORMAL HIGH (ref 65–99)
Potassium: 5.7 mmol/L — ABNORMAL HIGH (ref 3.5–5.2)
Sodium: 140 mmol/L (ref 134–144)
Total Protein: 7.2 g/dL (ref 6.0–8.5)

## 2019-11-17 LAB — CBC WITH DIFFERENTIAL/PLATELET
Basophils Absolute: 0.1 10*3/uL (ref 0.0–0.2)
Basos: 1 %
EOS (ABSOLUTE): 0.1 10*3/uL (ref 0.0–0.4)
Eos: 1 %
Hematocrit: 43.7 % (ref 37.5–51.0)
Hemoglobin: 14.1 g/dL (ref 13.0–17.7)
Immature Grans (Abs): 0 10*3/uL (ref 0.0–0.1)
Immature Granulocytes: 0 %
Lymphocytes Absolute: 3.1 10*3/uL (ref 0.7–3.1)
Lymphs: 27 %
MCH: 28.4 pg (ref 26.6–33.0)
MCHC: 32.3 g/dL (ref 31.5–35.7)
MCV: 88 fL (ref 79–97)
Monocytes Absolute: 0.8 10*3/uL (ref 0.1–0.9)
Monocytes: 7 %
Neutrophils Absolute: 7.1 10*3/uL — ABNORMAL HIGH (ref 1.4–7.0)
Neutrophils: 64 %
Platelets: 294 10*3/uL (ref 150–450)
RBC: 4.97 x10E6/uL (ref 4.14–5.80)
RDW: 14 % (ref 11.6–15.4)
WBC: 11.2 10*3/uL — ABNORMAL HIGH (ref 3.4–10.8)

## 2019-11-17 LAB — LIPID PANEL
Chol/HDL Ratio: 5.7 ratio — ABNORMAL HIGH (ref 0.0–5.0)
Cholesterol, Total: 172 mg/dL (ref 100–199)
HDL: 30 mg/dL — ABNORMAL LOW (ref >39)
LDL Chol Calc (NIH): 106 mg/dL — ABNORMAL HIGH (ref 0–99)
Triglycerides: 205 mg/dL — ABNORMAL HIGH (ref 0–149)
VLDL Cholesterol Cal: 36 mg/dL (ref 5–40)

## 2019-11-17 LAB — PSA, TOTAL AND FREE
PSA, Free Pct: 25.7 %
PSA, Free: 0.18 ng/mL
Prostate Specific Ag, Serum: 0.7 ng/mL (ref 0.0–4.0)

## 2019-11-20 ENCOUNTER — Ambulatory Visit: Payer: Medicare Other | Admitting: Physical Therapy

## 2019-12-07 ENCOUNTER — Other Ambulatory Visit: Payer: Self-pay

## 2019-12-07 ENCOUNTER — Encounter: Payer: Self-pay | Admitting: Family Medicine

## 2019-12-07 ENCOUNTER — Ambulatory Visit (INDEPENDENT_AMBULATORY_CARE_PROVIDER_SITE_OTHER): Payer: Medicare Other | Admitting: Family Medicine

## 2019-12-07 VITALS — BP 140/80 | HR 81 | Temp 98.0°F | Resp 20 | Ht 70.0 in | Wt 238.2 lb

## 2019-12-07 DIAGNOSIS — M5431 Sciatica, right side: Secondary | ICD-10-CM

## 2019-12-07 MED ORDER — PREGABALIN 50 MG PO CAPS
ORAL_CAPSULE | ORAL | 1 refills | Status: DC
Start: 2019-12-07 — End: 2020-08-13

## 2019-12-07 NOTE — Progress Notes (Signed)
Subjective:  Patient ID: Tyler Garner, male    DOB: [birth year]  Age: 64 y.o. MRN: [id number]  CC: 6 week follow up   HPI Tyler Garner presents for lumbar radicular pain recently treated with prednisone taper and physical therapy.  He is no longer having any back pain however he continues to have moderate pain running down from the right sciatic notch to the ankle along the posterior lateral aspect of the thigh and leg.  He can walk as far as his mailbox he says is about 50 yards.  When he walks there and back to the house he has to sit and rest for a while.  Is about the maximum pain to a 1 time.  He does get some relief by sitting down and resting.  It is limited his activities as far as hunting and gardening.  He tells me this is been going on for about a year now.  Depression screen [facility] 2/9 11/16/2019 10/25/2019 05/05/2019  Decreased Interest 0 0 0  Down, Depressed, Hopeless 0 0 0  PHQ - 2 Score 0 0 0  Altered sleeping - - -  Tired, decreased energy - - -  Change in appetite - - -  Feeling bad or failure about yourself  - - -  Trouble concentrating - - -  Moving slowly or fidgety/restless - - -  Suicidal thoughts - - -  PHQ-9 Score - - -  Some recent data might be hidden    History Tyler Garner has a past medical history of Arthritis, CAD (coronary artery disease), Diabetes mellitus without complication (HCC), Hyperglycemia, Hyperlipidemia, Hypertension, Ischemic cardiomyopathy, Nicotine addiction, Renal colic, and Right shoulder injury (10/11/09).   He has a past surgical history that includes  heart stent and left heart catheterization with coronary angiogram (N/A, 06/21/2013).   His family history includes ALS in his mother; Alzheimer's disease in his father; Diabetes in his brother and mother.He reports that he has quit smoking. He has quit using smokeless tobacco. He reports current alcohol use. He reports that he does not use drugs.    ROS Review of Systems  Constitutional:  Negative for fever.  Respiratory: Negative for shortness of breath.   Cardiovascular: Negative for chest pain.  Musculoskeletal: Positive for arthralgias and myalgias.  Skin: Negative for rash.    Objective:  BP 140/80   Pulse 81   Temp 98 F (36.7 C) (Temporal)   Resp 20   Ht 5\' 10"  (1.778 m)   Wt 238 lb 4 oz (108.1 kg)   SpO2 95%   BMI 34.19 kg/m   BP Readings from Last 3 Encounters:  12/07/19 140/80  11/16/19 131/71  10/25/19 139/82    Wt Readings from Last 3 Encounters:  12/07/19 238 lb 4 oz (108.1 kg)  11/16/19 232 lb (105.2 kg)  10/25/19 233 lb 6.4 oz (105.9 kg)     Physical Exam Vitals reviewed.  Constitutional:      Appearance: He is well-developed.  HENT:     Head: Normocephalic and atraumatic.     Right Ear: Tympanic membrane and external ear normal. No decreased hearing noted.     Left Ear: Tympanic membrane and external ear normal. No decreased hearing noted.     Mouth/Throat:     Pharynx: No oropharyngeal exudate or posterior oropharyngeal erythema.  Eyes:     Pupils: Pupils are equal, round, and reactive to light.  Cardiovascular:     Rate and Rhythm: Normal rate and regular rhythm.  Heart sounds: No murmur heard.   Pulmonary:     Effort: No respiratory distress.     Breath sounds: Normal breath sounds.  Abdominal:     General: Bowel sounds are normal.     Palpations: Abdomen is soft. There is no mass.     Tenderness: There is no abdominal tenderness.  Musculoskeletal:     Cervical back: Normal range of motion and neck supple.       Assessment & Plan:   Tyler Garner was seen today for 6 week follow up.  Diagnoses and all orders for this visit:  Sciatica of right side  Other orders -     pregabalin (LYRICA) 50 MG capsule; 1 qhs X7 days , then 2 qhs X 7d, then 3 qhs X 7d, then 4 qhs       I am having Annice Pih R. Cudney start on pregabalin. I am also having him maintain his aspirin, glucose blood, Pen Needles, meloxicam, metoprolol  tartrate, amLODipine, lisinopril, rosuvastatin, Lantus SoloStar, glimepiride, and Janumet.  Allergies as of 12/07/2019      Reactions   Percocet [oxycodone-acetaminophen] Nausea And Vomiting      Medication List       Accurate as of December 07, 2019 11:59 PM. If you have any questions, ask your nurse or doctor.        amLODipine 5 MG tablet Commonly known as: NORVASC Take 1 tablet (5 mg total) by mouth daily.   aspirin 81 MG tablet Take 81 mg by mouth daily.   glimepiride 4 MG tablet Commonly known as: AMARYL Take 1 tablet (4 mg total) by mouth 2 (two) times daily.   glucose blood test strip Commonly known as: ACCU-CHEK ACTIVE STRIPS Check blood sugar daily and prn  Dx E11.9   Janumet 50-1000 MG tablet Generic drug: sitaGLIPtin-metformin Take 1 tablet by mouth 2 (two) times daily with a meal.   Lantus SoloStar 100 UNIT/ML Solostar Pen Generic drug: insulin glargine Inject 60 Units into the skin 2 (two) times daily.   lisinopril 10 MG tablet Commonly known as: ZESTRIL Take 1 tablet (10 mg total) by mouth daily.   meloxicam 15 MG tablet Commonly known as: MOBIC Take 1 tablet (15 mg total) by mouth daily.   metoprolol tartrate 25 MG tablet Commonly known as: LOPRESSOR Take 1 tablet (25 mg total) by mouth 2 (two) times daily.   Pen Needles 31G X 8 MM Misc Use with lantus solosar pen daily   pregabalin 50 MG capsule Commonly known as: Lyrica 1 qhs X7 days , then 2 qhs X 7d, then 3 qhs X 7d, then 4 qhs Started by: Mechele Claude, MD   rosuvastatin 40 MG tablet Commonly known as: Crestor Take 0.5 tablets (20 mg total) by mouth daily.        Follow-up: Return in about 1 month (around 01/07/2020).  Mechele Claude, M.D.

## 2019-12-10 ENCOUNTER — Encounter: Payer: Self-pay | Admitting: Family Medicine

## 2019-12-11 ENCOUNTER — Ambulatory Visit: Payer: Medicare Other | Admitting: Nurse Practitioner

## 2020-02-22 ENCOUNTER — Ambulatory Visit (INDEPENDENT_AMBULATORY_CARE_PROVIDER_SITE_OTHER): Payer: Medicare Other | Admitting: Nurse Practitioner

## 2020-02-22 ENCOUNTER — Encounter: Payer: Self-pay | Admitting: Nurse Practitioner

## 2020-02-22 ENCOUNTER — Other Ambulatory Visit: Payer: Self-pay

## 2020-02-22 VITALS — BP 135/84 | HR 77 | Temp 97.8°F | Ht 70.0 in | Wt 233.0 lb

## 2020-02-22 DIAGNOSIS — M2550 Pain in unspecified joint: Secondary | ICD-10-CM

## 2020-02-22 DIAGNOSIS — I25111 Atherosclerotic heart disease of native coronary artery with angina pectoris with documented spasm: Secondary | ICD-10-CM

## 2020-02-22 DIAGNOSIS — I10 Essential (primary) hypertension: Secondary | ICD-10-CM | POA: Diagnosis not present

## 2020-02-22 DIAGNOSIS — E782 Mixed hyperlipidemia: Secondary | ICD-10-CM

## 2020-02-22 DIAGNOSIS — E1165 Type 2 diabetes mellitus with hyperglycemia: Secondary | ICD-10-CM | POA: Diagnosis not present

## 2020-02-22 DIAGNOSIS — Z23 Encounter for immunization: Secondary | ICD-10-CM

## 2020-02-22 LAB — LIPID PANEL
Chol/HDL Ratio: 7.3 ratio — ABNORMAL HIGH (ref 0.0–5.0)
Cholesterol, Total: 154 mg/dL (ref 100–199)
HDL: 21 mg/dL — ABNORMAL LOW (ref >39)
LDL Chol Calc (NIH): 71 mg/dL (ref 0–99)
Triglycerides: 387 mg/dL — ABNORMAL HIGH (ref 0–149)
VLDL Cholesterol Cal: 62 mg/dL — ABNORMAL HIGH (ref 5–40)

## 2020-02-22 LAB — CBC WITH DIFFERENTIAL/PLATELET
Basophils Absolute: 0.1 10*3/uL (ref 0.0–0.2)
Basos: 1 %
EOS (ABSOLUTE): 0.3 10*3/uL (ref 0.0–0.4)
Eos: 3 %
Hematocrit: 41.6 % (ref 37.5–51.0)
Hemoglobin: 13.9 g/dL (ref 13.0–17.7)
Immature Grans (Abs): 0 10*3/uL (ref 0.0–0.1)
Immature Granulocytes: 0 %
Lymphocytes Absolute: 4.1 10*3/uL — ABNORMAL HIGH (ref 0.7–3.1)
Lymphs: 45 %
MCH: 29.2 pg (ref 26.6–33.0)
MCHC: 33.4 g/dL (ref 31.5–35.7)
MCV: 87 fL (ref 79–97)
Monocytes Absolute: 0.8 10*3/uL (ref 0.1–0.9)
Monocytes: 8 %
Neutrophils Absolute: 3.9 10*3/uL (ref 1.4–7.0)
Neutrophils: 43 %
Platelets: 312 10*3/uL (ref 150–450)
RBC: 4.76 x10E6/uL (ref 4.14–5.80)
RDW: 14.8 % (ref 11.6–15.4)
WBC: 9.1 10*3/uL (ref 3.4–10.8)

## 2020-02-22 LAB — CMP14+EGFR
ALT: 21 IU/L (ref 0–44)
AST: 28 IU/L (ref 0–40)
Albumin/Globulin Ratio: 1.9 (ref 1.2–2.2)
Albumin: 4.9 g/dL — ABNORMAL HIGH (ref 3.8–4.8)
Alkaline Phosphatase: 55 IU/L (ref 44–121)
BUN/Creatinine Ratio: 23 (ref 10–24)
BUN: 19 mg/dL (ref 8–27)
Bilirubin Total: 0.3 mg/dL (ref 0.0–1.2)
CO2: 25 mmol/L (ref 20–29)
Calcium: 10.1 mg/dL (ref 8.6–10.2)
Chloride: 101 mmol/L (ref 96–106)
Creatinine, Ser: 0.84 mg/dL (ref 0.76–1.27)
GFR calc Af Amer: 106 mL/min/{1.73_m2} (ref >59)
GFR calc non Af Amer: 92 mL/min/{1.73_m2} (ref >59)
Globulin, Total: 2.6 g/dL (ref 1.5–4.5)
Glucose: 168 mg/dL — ABNORMAL HIGH (ref 65–99)
Potassium: 5.6 mmol/L — ABNORMAL HIGH (ref 3.5–5.2)
Sodium: 141 mmol/L (ref 134–144)
Total Protein: 7.5 g/dL (ref 6.0–8.5)

## 2020-02-22 LAB — BAYER DCA HB A1C WAIVED: HB A1C (BAYER DCA - WAIVED): 7.6 % — ABNORMAL HIGH (ref <7.0)

## 2020-02-22 MED ORDER — METOPROLOL TARTRATE 25 MG PO TABS: 25.0000 mg | ORAL_TABLET | Freq: Two times a day (BID) | ORAL | 1 refills | Status: DC

## 2020-02-22 MED ORDER — LISINOPRIL 10 MG PO TABS: 10.0000 mg | ORAL_TABLET | Freq: Every day | ORAL | 11 refills | Status: DC

## 2020-02-22 MED ORDER — LANTUS SOLOSTAR 100 UNIT/ML ~~LOC~~ SOPN: 60.0000 [IU] | PEN_INJECTOR | Freq: Two times a day (BID) | SUBCUTANEOUS | 1 refills | Status: DC

## 2020-02-22 MED ORDER — ROSUVASTATIN CALCIUM 40 MG PO TABS: 20.0000 mg | ORAL_TABLET | Freq: Every day | ORAL | 5 refills | Status: DC

## 2020-02-22 MED ORDER — GLIMEPIRIDE 4 MG PO TABS: 4.0000 mg | ORAL_TABLET | Freq: Two times a day (BID) | ORAL | 1 refills | Status: DC

## 2020-02-22 MED ORDER — AMLODIPINE BESYLATE 5 MG PO TABS: 5.0000 mg | ORAL_TABLET | Freq: Every day | ORAL | 1 refills | Status: DC

## 2020-02-22 MED ORDER — JANUMET 50-1000 MG PO TABS: 1.0000 | ORAL_TABLET | Freq: Two times a day (BID) | ORAL | 1 refills | Status: DC

## 2020-02-22 MED ORDER — CELECOXIB 200 MG PO CAPS: 200.0000 mg | ORAL_CAPSULE | Freq: Two times a day (BID) | ORAL | 3 refills | Status: DC

## 2020-02-22 NOTE — Patient Instructions (Signed)
Diabetes Mellitus and Foot Care Foot care is an important part of your health, especially when you have diabetes. Diabetes may cause you to have problems because of poor blood flow (circulation) to your feet and legs, which can cause your skin to:  Become thinner and drier.  Break more easily.  Heal more slowly.  Peel and crack. You may also have nerve damage (neuropathy) in your legs and feet, causing decreased feeling in them. This means that you may not notice minor injuries to your feet that could lead to more serious problems. Noticing and addressing any potential problems early is the best way to prevent future foot problems. How to care for your feet Foot hygiene  Wash your feet daily with warm water and mild soap. Do not use hot water. Then, pat your feet and the areas between your toes until they are completely dry. Do not soak your feet as this can dry your skin.  Trim your toenails straight across. Do not dig under them or around the cuticle. File the edges of your nails with an emery board or nail file.  Apply a moisturizing lotion or petroleum jelly to the skin on your feet and to dry, brittle toenails. Use lotion that does not contain alcohol and is unscented. Do not apply lotion between your toes. Shoes and socks  Wear clean socks or stockings every day. Make sure they are not too tight. Do not wear knee-high stockings since they may decrease blood flow to your legs.  Wear shoes that fit properly and have enough cushioning. Always look in your shoes before you put them on to be sure there are no objects inside.  To break in new shoes, wear them for just a few hours a day. This prevents injuries on your feet. Wounds, scrapes, corns, and calluses  Check your feet daily for blisters, cuts, bruises, sores, and redness. If you cannot see the bottom of your feet, use a mirror or ask someone for help.  Do not cut corns or calluses or try to remove them with medicine.  If you  find a minor scrape, cut, or break in the skin on your feet, keep it and the skin around it clean and dry. You may clean these areas with mild soap and water. Do not clean the area with peroxide, alcohol, or iodine.  If you have a wound, scrape, corn, or callus on your foot, look at it several times a day to make sure it is healing and not infected. Check for: ? Redness, swelling, or pain. ? Fluid or blood. ? Warmth. ? Pus or a bad smell. General instructions  Do not cross your legs. This may decrease blood flow to your feet.  Do not use heating pads or hot water bottles on your feet. They may burn your skin. If you have lost feeling in your feet or legs, you may not know this is happening until it is too late.  Protect your feet from hot and cold by wearing shoes, such as at the beach or on hot pavement.  Schedule a complete foot exam at least once a year (annually) or more often if you have foot problems. If you have foot problems, report any cuts, sores, or bruises to your health care provider immediately. Contact a health care provider if:  You have a medical condition that increases your risk of infection and you have any cuts, sores, or bruises on your feet.  You have an injury that is not   healing.  You have redness on your legs or feet.  You feel burning or tingling in your legs or feet.  You have pain or cramps in your legs and feet.  Your legs or feet are numb.  Your feet always feel cold.  You have pain around a toenail. Get help right away if:  You have a wound, scrape, corn, or callus on your foot and: ? You have pain, swelling, or redness that gets worse. ? You have fluid or blood coming from the wound, scrape, corn, or callus. ? Your wound, scrape, corn, or callus feels warm to the touch. ? You have pus or a bad smell coming from the wound, scrape, corn, or callus. ? You have a fever. ? You have a red line going up your leg. Summary  Check your feet every day  for cuts, sores, red spots, swelling, and blisters.  Moisturize feet and legs daily.  Wear shoes that fit properly and have enough cushioning.  If you have foot problems, report any cuts, sores, or bruises to your health care provider immediately.  Schedule a complete foot exam at least once a year (annually) or more often if you have foot problems. This information is not intended to replace advice given to you by your health care provider. Make sure you discuss any questions you have with your health care provider. Document Revised: 02/01/2019 Document Reviewed: 06/12/2016 Elsevier Patient Education  2020 [employer]  

## 2020-02-22 NOTE — Progress Notes (Signed)
Subjective:    Patient ID: Tyler Garner, male    DOB: [birth year], 64 y.o.   MRN: [id number]   Chief Complaint: Medical Management of Chronic Issues    HPI:  1. Essential hypertension No c/o chest pain, sob or headache. Does not check blood pressure at home. BP Readings from Last 3 Encounters:  02/22/20 135/84  12/07/19 140/80  11/16/19 131/71     2. Mixed hyperlipidemia Does not watch diet and does very little exercise Lab Results  Component Value Date   CHOL 172 11/16/2019   HDL 30 (L) 11/16/2019   LDLCALC 106 (H) 11/16/2019   TRIG 205 (H) 11/16/2019   CHOLHDL 5.7 (H) 11/16/2019     3. Uncontrolled type 2 diabetes mellitus with hyperglycemia ([place]) He does not check his blood sugars at home. He does not check his diet at all. Lab Results  Component Value Date   HGBA1C 7.9 (H) 11/16/2019     4. Atherosclerosis of native coronary artery of native heart with angina pectoris with documented spasm [facility]) Has not cardiologist in several years. Does not want to go right now.  5. Morbid obesity ([place]) No recent weight changes Wt Readings from Last 3 Encounters:  02/22/20 233 lb (105.7 kg)  12/07/19 238 lb 4 oz (108.1 kg)  11/16/19 232 lb (105.2 kg)   BMI Readings from Last 3 Encounters:  02/22/20 33.43 kg/m  12/07/19 34.19 kg/m  11/16/19 33.29 kg/m       Outpatient Encounter Medications as of 02/22/2020  Medication Sig  . amLODipine (NORVASC) 5 MG tablet Take 1 tablet (5 mg total) by mouth daily.  Marland Kitchen aspirin 81 MG tablet Take 81 mg by mouth daily.    Marland Kitchen glimepiride (AMARYL) 4 MG tablet Take 1 tablet (4 mg total) by mouth 2 (two) times daily.  Marland Kitchen glucose blood (ACCU-CHEK ACTIVE STRIPS) test strip Check blood sugar daily and prn  Dx E11.9  . insulin glargine (LANTUS SOLOSTAR) 100 UNIT/ML Solostar Pen Inject 60 Units into the skin 2 (two) times daily.  . Insulin Pen Needle (PEN NEEDLES) 31G X 8 MM MISC Use with lantus solosar pen daily  . lisinopril (ZESTRIL)  10 MG tablet Take 1 tablet (10 mg total) by mouth daily.  . metoprolol tartrate (LOPRESSOR) 25 MG tablet Take 1 tablet (25 mg total) by mouth 2 (two) times daily.  . pregabalin (LYRICA) 50 MG capsule 1 qhs X7 days , then 2 qhs X 7d, then 3 qhs X 7d, then 4 qhs  . rosuvastatin (CRESTOR) 40 MG tablet Take 0.5 tablets (20 mg total) by mouth daily.  . sitaGLIPtin-metformin (JANUMET) 50-1000 MG tablet Take 1 tablet by mouth 2 (two) times daily with a meal.  . meloxicam (MOBIC) 15 MG tablet Take 1 tablet (15 mg total) by mouth daily. (Patient not taking: Reported on 02/22/2020)     Past Surgical History:  Procedure Laterality Date  .  heart stent    . LEFT HEART CATHETERIZATION WITH CORONARY ANGIOGRAM N/A 06/21/2013   Procedure: LEFT HEART CATHETERIZATION WITH CORONARY ANGIOGRAM;  Surgeon: Peter M Martinique, MD;  Location: [facility] CATH LAB;  Service: Cardiovascular;  Laterality: N/A;    Family History  Problem Relation Age of Onset  . Diabetes Mother   . ALS Mother   . Alzheimer's disease Father   . Diabetes Brother     New complaints: Has scattered joint pain. Worse in his hands  Social history: Son lives with him. Keeps his granddaughter alot  Controlled  substance contract: n/a    Review of Systems  Constitutional: Negative for diaphoresis.  Eyes: Negative for pain.  Respiratory: Negative for shortness of breath.   Cardiovascular: Negative for chest pain, palpitations and leg swelling.  Gastrointestinal: Negative for abdominal pain.  Endocrine: Negative for polydipsia.  Skin: Negative for rash.  Neurological: Negative for dizziness, weakness and headaches.  Hematological: Does not bruise/bleed easily.  All other systems reviewed and are negative.      Objective:   Physical Exam Vitals and nursing note reviewed.  Constitutional:      Appearance: Normal appearance. He is well-developed.  HENT:     Head: Normocephalic.     Nose: Nose normal.  Eyes:     Pupils: Pupils are  equal, round, and reactive to light.  Neck:     Thyroid: No thyroid mass or thyromegaly.     Vascular: No carotid bruit or JVD.     Trachea: Phonation normal.  Cardiovascular:     Rate and Rhythm: Normal rate and regular rhythm.  Pulmonary:     Effort: Pulmonary effort is normal. No respiratory distress.     Breath sounds: Normal breath sounds.  Abdominal:     General: Bowel sounds are normal.     Palpations: Abdomen is soft.     Tenderness: There is no abdominal tenderness.  Musculoskeletal:        General: Normal range of motion.     Cervical back: Normal range of motion and neck supple.  Lymphadenopathy:     Cervical: No cervical adenopathy.  Skin:    General: Skin is warm and dry.  Neurological:     Mental Status: He is alert and oriented to person, place, and time.  Psychiatric:        Behavior: Behavior normal.        Thought Content: Thought content normal.        Judgment: Judgment normal.     BP 135/84   Pulse 77   Temp 97.8 F (36.6 C) (Temporal)   Ht 5' 10"  (1.778 m)   Wt 233 lb (105.7 kg)   BMI 33.43 kg/m   HGBA1c 7.6%     Assessment & Plan:  LALO TROMP comes in today with chief complaint of Medical Management of Chronic Issues   Diagnosis and orders addressed:  1. Essential hypertension Low sodium diet - CBC with Differential/Platelet - CMP14+EGFR - amLODipine (NORVASC) 5 MG tablet; Take 1 tablet (5 mg total) by mouth daily.  Dispense: 90 tablet; Refill: 1 - lisinopril (ZESTRIL) 10 MG tablet; Take 1 tablet (10 mg total) by mouth daily.  Dispense: 30 tablet; Refill: 11  2. Mixed hyperlipidemia Low fat diet - CBC with Differential/Platelet - CMP14+EGFR - Lipid panel - rosuvastatin (CRESTOR) 40 MG tablet; Take 0.5 tablets (20 mg total) by mouth daily.  Dispense: 45 tablet; Refill: 5  3.  2 diabetes mellitus with hyperglycemia (HCC) Continue to watch carbs in diet - CBC with Differential/Platelet - CMP14+EGFR - Bayer DCA Hb A1c  Waived  4. Atherosclerosis of native coronary artery of native heart with angina pectoris with documented spasm ([place]) If develops chest pain need to see cardiology - metoprolol tartrate (LOPRESSOR) 25 MG tablet; Take 1 tablet (25 mg total) by mouth 2 (two) times daily.  Dispense: 180 tablet; Refill: 1  5. Morbid obesity ([place]) Discussed diet and exercise for person with BMI >25 Will recheck weight in 3-6 months  6. Arthralgia, unspecified joint Added celebrex to meds today -  celecoxib (CELEBREX) 200 MG capsule; Take 1 capsule (200 mg total) by mouth 2 (two) times daily.  Dispense: 60 capsule; Refill: 3   Labs pending Health Maintenance reviewed- will schedule eye exam Diet and exercise encouraged  Follow up plan: 3 months   [name] Hassell Done, [name]

## 2020-03-06 ENCOUNTER — Encounter: Payer: Self-pay | Admitting: *Deleted

## 2020-05-27 ENCOUNTER — Other Ambulatory Visit: Payer: Self-pay | Admitting: Nurse Practitioner

## 2020-05-27 DIAGNOSIS — E1165 Type 2 diabetes mellitus with hyperglycemia: Secondary | ICD-10-CM

## 2020-05-28 ENCOUNTER — Encounter: Payer: Self-pay | Admitting: Nurse Practitioner

## 2020-05-28 ENCOUNTER — Other Ambulatory Visit: Payer: Self-pay

## 2020-05-28 ENCOUNTER — Ambulatory Visit (INDEPENDENT_AMBULATORY_CARE_PROVIDER_SITE_OTHER): Payer: Medicare Other | Admitting: Nurse Practitioner

## 2020-05-28 VITALS — BP 128/82 | HR 71 | Temp 97.4°F | Resp 20 | Ht 70.0 in | Wt 233.0 lb

## 2020-05-28 DIAGNOSIS — E119 Type 2 diabetes mellitus without complications: Secondary | ICD-10-CM | POA: Diagnosis not present

## 2020-05-28 DIAGNOSIS — I10 Essential (primary) hypertension: Secondary | ICD-10-CM | POA: Diagnosis not present

## 2020-05-28 DIAGNOSIS — I25111 Atherosclerotic heart disease of native coronary artery with angina pectoris with documented spasm: Secondary | ICD-10-CM

## 2020-05-28 DIAGNOSIS — I252 Old myocardial infarction: Secondary | ICD-10-CM | POA: Diagnosis not present

## 2020-05-28 DIAGNOSIS — E1165 Type 2 diabetes mellitus with hyperglycemia: Secondary | ICD-10-CM | POA: Diagnosis not present

## 2020-05-28 DIAGNOSIS — E782 Mixed hyperlipidemia: Secondary | ICD-10-CM | POA: Diagnosis not present

## 2020-05-28 LAB — CMP14+EGFR
ALT: 15 IU/L (ref 0–44)
AST: 19 IU/L (ref 0–40)
Albumin/Globulin Ratio: 2 (ref 1.2–2.2)
Albumin: 4.7 g/dL (ref 3.8–4.8)
Alkaline Phosphatase: 53 IU/L (ref 44–121)
BUN/Creatinine Ratio: 16 (ref 10–24)
BUN: 14 mg/dL (ref 8–27)
Bilirubin Total: 0.3 mg/dL (ref 0.0–1.2)
CO2: 25 mmol/L (ref 20–29)
Calcium: 9.8 mg/dL (ref 8.6–10.2)
Chloride: 102 mmol/L (ref 96–106)
Creatinine, Ser: 0.89 mg/dL (ref 0.76–1.27)
GFR calc Af Amer: 104 mL/min/{1.73_m2} (ref >59)
GFR calc non Af Amer: 90 mL/min/{1.73_m2} (ref >59)
Globulin, Total: 2.4 g/dL (ref 1.5–4.5)
Glucose: 124 mg/dL — ABNORMAL HIGH (ref 65–99)
Potassium: 5.2 mmol/L (ref 3.5–5.2)
Sodium: 142 mmol/L (ref 134–144)
Total Protein: 7.1 g/dL (ref 6.0–8.5)

## 2020-05-28 LAB — CBC WITH DIFFERENTIAL/PLATELET
Basophils Absolute: 0.1 10*3/uL (ref 0.0–0.2)
Basos: 1 %
EOS (ABSOLUTE): 0.3 10*3/uL (ref 0.0–0.4)
Eos: 3 %
Hematocrit: 40.8 % (ref 37.5–51.0)
Hemoglobin: 13.4 g/dL (ref 13.0–17.7)
Immature Grans (Abs): 0 10*3/uL (ref 0.0–0.1)
Immature Granulocytes: 0 %
Lymphocytes Absolute: 3.9 10*3/uL — ABNORMAL HIGH (ref 0.7–3.1)
Lymphs: 45 %
MCH: 29 pg (ref 26.6–33.0)
MCHC: 32.8 g/dL (ref 31.5–35.7)
MCV: 88 fL (ref 79–97)
Monocytes Absolute: 0.7 10*3/uL (ref 0.1–0.9)
Monocytes: 8 %
Neutrophils Absolute: 3.8 10*3/uL (ref 1.4–7.0)
Neutrophils: 43 %
Platelets: 323 10*3/uL (ref 150–450)
RBC: 4.62 x10E6/uL (ref 4.14–5.80)
RDW: 14.4 % (ref 11.6–15.4)
WBC: 8.7 10*3/uL (ref 3.4–10.8)

## 2020-05-28 LAB — LIPID PANEL
Chol/HDL Ratio: 6 ratio — ABNORMAL HIGH (ref 0.0–5.0)
Cholesterol, Total: 132 mg/dL (ref 100–199)
HDL: 22 mg/dL — ABNORMAL LOW (ref >39)
LDL Chol Calc (NIH): 61 mg/dL (ref 0–99)
Triglycerides: 311 mg/dL — ABNORMAL HIGH (ref 0–149)
VLDL Cholesterol Cal: 49 mg/dL — ABNORMAL HIGH (ref 5–40)

## 2020-05-28 LAB — BAYER DCA HB A1C WAIVED: HB A1C (BAYER DCA - WAIVED): 7.3 % — ABNORMAL HIGH (ref <7.0)

## 2020-05-28 NOTE — Patient Instructions (Signed)
Carbohydrate Counting for Diabetes Mellitus, Adult  Carbohydrate counting is a method of keeping track of how many carbohydrates you eat. Eating carbohydrates naturally increases the amount of sugar (glucose) in the blood. Counting how many carbohydrates you eat helps keep your blood glucose within normal limits, which helps you manage your diabetes (diabetes mellitus). It is important to know how many carbohydrates you can safely have in each meal. This is different for every person. A diet and nutrition specialist (registered dietitian) can help you make a meal plan and calculate how many carbohydrates you should have at each meal and snack. Carbohydrates are found in the following foods:  Grains, such as breads and cereals.  Dried beans and soy products.  Starchy vegetables, such as potatoes, peas, and corn.  Fruit and fruit juices.  Milk and yogurt.  Sweets and snack foods, such as cake, cookies, candy, chips, and soft drinks. How do I count carbohydrates? There are two ways to count carbohydrates in food. You can use either of the methods or a combination of both. Reading "Nutrition Facts" on packaged food The "Nutrition Facts" list is included on the labels of almost all packaged foods and beverages in the [place] It includes:  The serving size.  Information about nutrients in each serving, including the grams (g) of carbohydrate per serving. To use the "Nutrition Facts":  Decide how many servings you will have.  Multiply the number of servings by the number of carbohydrates per serving.  The resulting number is the total amount of carbohydrates that you will be having. Learning standard serving sizes of other foods When you eat carbohydrate foods that are not packaged or do not include "Nutrition Facts" on the label, you need to measure the servings in order to count the amount of carbohydrates:  Measure the foods that you will eat with a food scale or measuring cup, if  needed.  Decide how many standard-size servings you will eat.  Multiply the number of servings by 15. Most carbohydrate-rich foods have about 15 g of carbohydrates per serving. ? For example, if you eat 8 oz (170 g) of strawberries, you will have eaten 2 servings and 30 g of carbohydrates (2 servings x 15 g = 30 g).  For foods that have more than one food mixed, such as soups and casseroles, you must count the carbohydrates in each food that is included. The following list contains standard serving sizes of common carbohydrate-rich foods. Each of these servings has about 15 g of carbohydrates:   hamburger bun or  English muffin.   oz (15 mL) syrup.   oz (14 g) jelly.  1 slice of bread.  1 six-inch tortilla.  3 oz (85 g) cooked rice or pasta.  4 oz (113 g) cooked dried beans.  4 oz (113 g) starchy vegetable, such as peas, corn, or potatoes.  4 oz (113 g) hot cereal.  4 oz (113 g) mashed potatoes or  of a large baked potato.  4 oz (113 g) canned or frozen fruit.  4 oz (120 mL) fruit juice.  4-6 crackers.  6 chicken nuggets.  6 oz (170 g) unsweetened dry cereal.  6 oz (170 g) plain fat-free yogurt or yogurt sweetened with artificial sweeteners.  8 oz (240 mL) milk.  8 oz (170 g) fresh fruit or one small piece of fruit.  24 oz (680 g) popped popcorn. Example of carbohydrate counting Sample meal  3 oz (85 g) chicken breast.  6 oz (170 g)   brown rice.  4 oz (113 g) corn.  8 oz (240 mL) milk.  8 oz (170 g) strawberries with sugar-free whipped topping. Carbohydrate calculation 1. Identify the foods that contain carbohydrates: ? Rice. ? Corn. ? Milk. ? Strawberries. 2. Calculate how many servings you have of each food: ? 2 servings rice. ? 1 serving corn. ? 1 serving milk. ? 1 serving strawberries. 3. Multiply each number of servings by 15 g: ? 2 servings rice x 15 g = 30 g. ? 1 serving corn x 15 g = 15 g. ? 1 serving milk x 15 g = 15 g. ? 1  serving strawberries x 15 g = 15 g. 4. Add together all of the amounts to find the total grams of carbohydrates eaten: ? 30 g + 15 g + 15 g + 15 g = 75 g of carbohydrates total. Summary  Carbohydrate counting is a method of keeping track of how many carbohydrates you eat.  Eating carbohydrates naturally increases the amount of sugar (glucose) in the blood.  Counting how many carbohydrates you eat helps keep your blood glucose within normal limits, which helps you manage your diabetes.  A diet and nutrition specialist (registered dietitian) can help you make a meal plan and calculate how many carbohydrates you should have at each meal and snack. This information is not intended to replace advice given to you by your health care provider. Make sure you discuss any questions you have with your health care provider. Document Revised: 12/03/2016 Document Reviewed: 10/23/2015 Elsevier Patient Education  2020 [employer]  

## 2020-05-28 NOTE — Progress Notes (Signed)
Subjective:    Patient ID: Tyler Garner, male    DOB: Oct 15, 1955, 65 y.o.   MRN: [id number]  Chief Complaint: medical management of chronic issues     HPI:  1. Primary hypertension No c/o chest pain, sob or headache. Does not check blood pressure at home. BP Readings from Last 3 Encounters:  02/22/20 135/84  12/07/19 140/80  11/16/19 131/71     2. Mixed hyperlipidemia Does not watch diet and does little to no exercise. Lab Results  Component Value Date   CHOL 154 02/22/2020   HDL 21 (L) 02/22/2020   LDLCALC 71 02/22/2020   TRIG 387 (H) 02/22/2020   CHOLHDL 7.3 (H) 02/22/2020      3. Uncontrolled type 2 diabetes mellitus with hyperglycemia ([place]) He doe snot check his blood sugars at home. Eats whatever he wants to. He says he feels good. Denies any symptoms of low blood sugars. Lab Results  Component Value Date   HGBA1C 7.6 (H) 02/22/2020     4. Hx of myocardial infarction He had a heart attack in 2015,with little to no heart damage. He refuse sto see cardiologist. Says he feels fine. Last saw cardiology in 2017.  5. Atherosclerosis of native coronary artery of native heart with angina pectoris with documented spasm [facility]) Patient is aware and still refuses to watch diet.  6. Morbid obesity ([place]) No recent weight changes  Wt Readings from Last 3 Encounters:  05/28/20 233 lb (105.7 kg)  02/22/20 233 lb (105.7 kg)  12/07/19 238 lb 4 oz (108.1 kg)   BMI Readings from Last 3 Encounters:  05/28/20 33.43 kg/m  02/22/20 33.43 kg/m  12/07/19 34.19 kg/m     Outpatient Encounter Medications as of 05/28/2020  Medication Sig  . amLODipine (NORVASC) 5 MG tablet Take 1 tablet (5 mg total) by mouth daily.  Marland Kitchen aspirin 81 MG tablet Take 81 mg by mouth daily.    . celecoxib (CELEBREX) 200 MG capsule Take 1 capsule (200 mg total) by mouth 2 (two) times daily.  Marland Kitchen glimepiride (AMARYL) 4 MG tablet Take 1 tablet (4 mg total) by mouth 2 (two) times daily.  Marland Kitchen glucose blood  (ACCU-CHEK ACTIVE STRIPS) test strip Check blood sugar daily and prn  Dx E11.9  . insulin glargine (LANTUS SOLOSTAR) 100 UNIT/ML Solostar Pen Inject 60 Units into the skin 2 (two) times daily.  . Insulin Pen Needle (PEN NEEDLES) 31G X 8 MM MISC Use with lantus solosar pen daily  . lisinopril (ZESTRIL) 10 MG tablet Take 1 tablet (10 mg total) by mouth daily.  . metoprolol tartrate (LOPRESSOR) 25 MG tablet Take 1 tablet (25 mg total) by mouth 2 (two) times daily.  . pregabalin (LYRICA) 50 MG capsule 1 qhs X7 days , then 2 qhs X 7d, then 3 qhs X 7d, then 4 qhs  . rosuvastatin (CRESTOR) 40 MG tablet Take 0.5 tablets (20 mg total) by mouth daily.  . sitaGLIPtin-metformin (JANUMET) 50-1000 MG tablet Take 1 tablet by mouth 2 (two) times daily with a meal.   No facility-administered encounter medications on file as of 05/28/2020.    Past Surgical History:  Procedure Laterality Date  .  heart stent    . LEFT HEART CATHETERIZATION WITH CORONARY ANGIOGRAM N/A 06/21/2013   Procedure: LEFT HEART CATHETERIZATION WITH CORONARY ANGIOGRAM;  Surgeon: Peter M Martinique, MD;  Location: [facility] CATH LAB;  Service: Cardiovascular;  Laterality: N/A;    Family History  Problem Relation Age of Onset  . Diabetes  Mother   . ALS Mother   . Alzheimer's disease Father   . Diabetes Brother     New complaints: None today  Social history: His son lives with him. He has a granddaughter he likes spending time with.  Controlled substance contract: n/a    Review of Systems  Constitutional: Negative for diaphoresis.  Eyes: Negative for pain.  Respiratory: Negative for shortness of breath.   Cardiovascular: Negative for chest pain, palpitations and leg swelling.  Gastrointestinal: Negative for abdominal pain.  Endocrine: Negative for polydipsia.  Skin: Negative for rash.  Neurological: Negative for dizziness, weakness and headaches.  Hematological: Does not bruise/bleed easily.  All other systems reviewed and are  negative.      Objective:   Physical Exam Vitals and nursing note reviewed.  Constitutional:      Appearance: Normal appearance. He is well-developed and well-nourished.  HENT:     Head: Normocephalic.     Nose: Nose normal.     Mouth/Throat:     Mouth: Oropharynx is clear and moist.  Eyes:     Extraocular Movements: EOM normal.     Pupils: Pupils are equal, round, and reactive to light.  Neck:     Thyroid: No thyroid mass or thyromegaly.     Vascular: No carotid bruit or JVD.     Trachea: Phonation normal.  Cardiovascular:     Rate and Rhythm: Normal rate and regular rhythm.  Pulmonary:     Effort: Pulmonary effort is normal. No respiratory distress.     Breath sounds: Normal breath sounds.  Abdominal:     General: Bowel sounds are normal. Aorta is normal.     Palpations: Abdomen is soft.     Tenderness: There is no abdominal tenderness.  Musculoskeletal:        General: Normal range of motion.     Cervical back: Normal range of motion and neck supple.  Lymphadenopathy:     Cervical: No cervical adenopathy.  Skin:    General: Skin is warm and dry.  Neurological:     Mental Status: He is alert and oriented to person, place, and time.  Psychiatric:        Mood and Affect: Mood and affect normal.        Behavior: Behavior normal.        Thought Content: Thought content normal.        Judgment: Judgment normal.     BP 128/82   Pulse 71   Temp (!) 97.4 F (36.3 C) (Temporal)   Resp 20   Ht $R'5\' 10"'XU$  (1.778 m)   Wt 233 lb (105.7 kg)   SpO2 98%   BMI 33.43 kg/m    HGBA1c 7.3%      Assessment & Plan:  Tyler Garner comes in today with chief complaint of Medical Management of Chronic Issues   Diagnosis and orders addressed:  1. Primary hypertension Low sodium diet - CBC with Differential/Platelet - CMP14+EGFR  2. Mixed hyperlipidemia Low fat diet - Lipid panel  3. Uncontrolled type 2 diabetes mellitus with hyperglycemia (HCC) Doing much better -  Bayer DCA Hb A1c Waived - Microalbumin / creatinine urine ratio  4. Hx of myocardial infarction Continue  To watch diet  5. Atherosclerosis of native coronary artery of native heart with angina pectoris with documented spasm ([place])  6. Morbid obesity ([place]) Discussed diet and exercise for person with BMI >25 Will recheck weight in 3-6 months    Labs pending Health Maintenance  reviewed- will make appointment for eye exam Diet and exercise encouraged  Follow up plan: 3 months   [name] Hassell Done, [name]

## 2020-05-29 DIAGNOSIS — M9902 Segmental and somatic dysfunction of thoracic region: Secondary | ICD-10-CM | POA: Diagnosis not present

## 2020-05-29 DIAGNOSIS — M9903 Segmental and somatic dysfunction of lumbar region: Secondary | ICD-10-CM | POA: Diagnosis not present

## 2020-05-29 DIAGNOSIS — M9901 Segmental and somatic dysfunction of cervical region: Secondary | ICD-10-CM | POA: Diagnosis not present

## 2020-05-29 DIAGNOSIS — M5137 Other intervertebral disc degeneration, lumbosacral region: Secondary | ICD-10-CM | POA: Diagnosis not present

## 2020-05-30 DIAGNOSIS — M5137 Other intervertebral disc degeneration, lumbosacral region: Secondary | ICD-10-CM | POA: Diagnosis not present

## 2020-05-30 DIAGNOSIS — M9903 Segmental and somatic dysfunction of lumbar region: Secondary | ICD-10-CM | POA: Diagnosis not present

## 2020-05-30 DIAGNOSIS — M9902 Segmental and somatic dysfunction of thoracic region: Secondary | ICD-10-CM | POA: Diagnosis not present

## 2020-05-30 DIAGNOSIS — M9901 Segmental and somatic dysfunction of cervical region: Secondary | ICD-10-CM | POA: Diagnosis not present

## 2020-06-03 DIAGNOSIS — M9903 Segmental and somatic dysfunction of lumbar region: Secondary | ICD-10-CM | POA: Diagnosis not present

## 2020-06-03 DIAGNOSIS — M9902 Segmental and somatic dysfunction of thoracic region: Secondary | ICD-10-CM | POA: Diagnosis not present

## 2020-06-03 DIAGNOSIS — M5137 Other intervertebral disc degeneration, lumbosacral region: Secondary | ICD-10-CM | POA: Diagnosis not present

## 2020-06-03 DIAGNOSIS — M9901 Segmental and somatic dysfunction of cervical region: Secondary | ICD-10-CM | POA: Diagnosis not present

## 2020-06-05 DIAGNOSIS — M5137 Other intervertebral disc degeneration, lumbosacral region: Secondary | ICD-10-CM | POA: Diagnosis not present

## 2020-06-05 DIAGNOSIS — M9901 Segmental and somatic dysfunction of cervical region: Secondary | ICD-10-CM | POA: Diagnosis not present

## 2020-06-05 DIAGNOSIS — M9902 Segmental and somatic dysfunction of thoracic region: Secondary | ICD-10-CM | POA: Diagnosis not present

## 2020-06-05 DIAGNOSIS — M9903 Segmental and somatic dysfunction of lumbar region: Secondary | ICD-10-CM | POA: Diagnosis not present

## 2020-06-06 DIAGNOSIS — M5137 Other intervertebral disc degeneration, lumbosacral region: Secondary | ICD-10-CM | POA: Diagnosis not present

## 2020-06-06 DIAGNOSIS — M9902 Segmental and somatic dysfunction of thoracic region: Secondary | ICD-10-CM | POA: Diagnosis not present

## 2020-06-06 DIAGNOSIS — M9903 Segmental and somatic dysfunction of lumbar region: Secondary | ICD-10-CM | POA: Diagnosis not present

## 2020-06-06 DIAGNOSIS — M9901 Segmental and somatic dysfunction of cervical region: Secondary | ICD-10-CM | POA: Diagnosis not present

## 2020-06-12 DIAGNOSIS — M9902 Segmental and somatic dysfunction of thoracic region: Secondary | ICD-10-CM | POA: Diagnosis not present

## 2020-06-12 DIAGNOSIS — M9903 Segmental and somatic dysfunction of lumbar region: Secondary | ICD-10-CM | POA: Diagnosis not present

## 2020-06-12 DIAGNOSIS — M9901 Segmental and somatic dysfunction of cervical region: Secondary | ICD-10-CM | POA: Diagnosis not present

## 2020-06-12 DIAGNOSIS — M5137 Other intervertebral disc degeneration, lumbosacral region: Secondary | ICD-10-CM | POA: Diagnosis not present

## 2020-06-13 DIAGNOSIS — M5137 Other intervertebral disc degeneration, lumbosacral region: Secondary | ICD-10-CM | POA: Diagnosis not present

## 2020-06-13 DIAGNOSIS — M9902 Segmental and somatic dysfunction of thoracic region: Secondary | ICD-10-CM | POA: Diagnosis not present

## 2020-06-13 DIAGNOSIS — M9901 Segmental and somatic dysfunction of cervical region: Secondary | ICD-10-CM | POA: Diagnosis not present

## 2020-06-13 DIAGNOSIS — M9903 Segmental and somatic dysfunction of lumbar region: Secondary | ICD-10-CM | POA: Diagnosis not present

## 2020-06-19 DIAGNOSIS — M9903 Segmental and somatic dysfunction of lumbar region: Secondary | ICD-10-CM | POA: Diagnosis not present

## 2020-06-19 DIAGNOSIS — M5137 Other intervertebral disc degeneration, lumbosacral region: Secondary | ICD-10-CM | POA: Diagnosis not present

## 2020-06-19 DIAGNOSIS — M9902 Segmental and somatic dysfunction of thoracic region: Secondary | ICD-10-CM | POA: Diagnosis not present

## 2020-06-19 DIAGNOSIS — M9901 Segmental and somatic dysfunction of cervical region: Secondary | ICD-10-CM | POA: Diagnosis not present

## 2020-06-20 DIAGNOSIS — M9901 Segmental and somatic dysfunction of cervical region: Secondary | ICD-10-CM | POA: Diagnosis not present

## 2020-06-20 DIAGNOSIS — M9902 Segmental and somatic dysfunction of thoracic region: Secondary | ICD-10-CM | POA: Diagnosis not present

## 2020-06-20 DIAGNOSIS — M9903 Segmental and somatic dysfunction of lumbar region: Secondary | ICD-10-CM | POA: Diagnosis not present

## 2020-06-20 DIAGNOSIS — M5137 Other intervertebral disc degeneration, lumbosacral region: Secondary | ICD-10-CM | POA: Diagnosis not present

## 2020-08-13 ENCOUNTER — Encounter: Payer: Self-pay | Admitting: Nurse Practitioner

## 2020-08-13 ENCOUNTER — Ambulatory Visit (INDEPENDENT_AMBULATORY_CARE_PROVIDER_SITE_OTHER): Payer: Medicare Other | Admitting: Nurse Practitioner

## 2020-08-13 ENCOUNTER — Other Ambulatory Visit: Payer: Self-pay

## 2020-08-13 VITALS — BP 150/81 | HR 75 | Temp 98.9°F | Resp 20 | Ht 70.0 in | Wt 233.0 lb

## 2020-08-13 DIAGNOSIS — E782 Mixed hyperlipidemia: Secondary | ICD-10-CM

## 2020-08-13 DIAGNOSIS — I25111 Atherosclerotic heart disease of native coronary artery with angina pectoris with documented spasm: Secondary | ICD-10-CM | POA: Diagnosis not present

## 2020-08-13 DIAGNOSIS — I252 Old myocardial infarction: Secondary | ICD-10-CM

## 2020-08-13 DIAGNOSIS — G8929 Other chronic pain: Secondary | ICD-10-CM | POA: Diagnosis not present

## 2020-08-13 DIAGNOSIS — M545 Low back pain, unspecified: Secondary | ICD-10-CM | POA: Diagnosis not present

## 2020-08-13 DIAGNOSIS — E1165 Type 2 diabetes mellitus with hyperglycemia: Secondary | ICD-10-CM | POA: Diagnosis not present

## 2020-08-13 DIAGNOSIS — I10 Essential (primary) hypertension: Secondary | ICD-10-CM | POA: Diagnosis not present

## 2020-08-13 LAB — BAYER DCA HB A1C WAIVED: HB A1C (BAYER DCA - WAIVED): 7.2 % — ABNORMAL HIGH (ref <7.0)

## 2020-08-13 MED ORDER — ROSUVASTATIN CALCIUM 40 MG PO TABS: 20.0000 mg | ORAL_TABLET | Freq: Every day | ORAL | 1 refills | Status: DC

## 2020-08-13 MED ORDER — JANUMET 50-1000 MG PO TABS: 1.0000 | ORAL_TABLET | Freq: Two times a day (BID) | ORAL | 1 refills | Status: DC

## 2020-08-13 MED ORDER — LISINOPRIL 20 MG PO TABS
20.0000 mg | ORAL_TABLET | Freq: Every day | ORAL | 1 refills | Status: DC
Start: 2020-08-13 — End: 2020-11-18

## 2020-08-13 MED ORDER — GLIMEPIRIDE 4 MG PO TABS
4.0000 mg | ORAL_TABLET | Freq: Two times a day (BID) | ORAL | 1 refills | Status: DC
Start: 2020-08-13 — End: 2020-11-18

## 2020-08-13 MED ORDER — LANTUS SOLOSTAR 100 UNIT/ML ~~LOC~~ SOPN: 60.0000 [IU] | PEN_INJECTOR | Freq: Two times a day (BID) | SUBCUTANEOUS | 3 refills | Status: DC

## 2020-08-13 MED ORDER — AMLODIPINE BESYLATE 5 MG PO TABS: 5.0000 mg | ORAL_TABLET | Freq: Every day | ORAL | 1 refills | Status: DC

## 2020-08-13 MED ORDER — METOPROLOL TARTRATE 25 MG PO TABS
25.0000 mg | ORAL_TABLET | Freq: Two times a day (BID) | ORAL | 1 refills | Status: DC
Start: 2020-08-13 — End: 2020-11-18

## 2020-08-13 NOTE — Patient Instructions (Signed)
Diabetes Mellitus and Foot Care Foot care is an important part of your health, especially when you have diabetes. Diabetes may cause you to have problems because of poor blood flow (circulation) to your feet and legs, which can cause your skin to:  Become thinner and drier.  Break more easily.  Heal more slowly.  Peel and crack. You may also have nerve damage (neuropathy) in your legs and feet, causing decreased feeling in them. This means that you may not notice minor injuries to your feet that could lead to more serious problems. Noticing and addressing any potential problems early is the best way to prevent future foot problems. How to care for your feet Foot hygiene  Wash your feet daily with warm water and mild soap. Do not use hot water. Then, pat your feet and the areas between your toes until they are completely dry. Do not soak your feet as this can dry your skin.  Trim your toenails straight across. Do not dig under them or around the cuticle. File the edges of your nails with an emery board or nail file.  Apply a moisturizing lotion or petroleum jelly to the skin on your feet and to dry, brittle toenails. Use lotion that does not contain alcohol and is unscented. Do not apply lotion between your toes.   Shoes and socks  Wear clean socks or stockings every day. Make sure they are not too tight. Do not wear knee-high stockings since they may decrease blood flow to your legs.  Wear shoes that fit properly and have enough cushioning. Always look in your shoes before you put them on to be sure there are no objects inside.  To break in new shoes, wear them for just a few hours a day. This prevents injuries on your feet. Wounds, scrapes, corns, and calluses  Check your feet daily for blisters, cuts, bruises, sores, and redness. If you cannot see the bottom of your feet, use a mirror or ask someone for help.  Do not cut corns or calluses or try to remove them with medicine.  If you  find a minor scrape, cut, or break in the skin on your feet, keep it and the skin around it clean and dry. You may clean these areas with mild soap and water. Do not clean the area with peroxide, alcohol, or iodine.  If you have a wound, scrape, corn, or callus on your foot, look at it several times a day to make sure it is healing and not infected. Check for: ? Redness, swelling, or pain. ? Fluid or blood. ? Warmth. ? Pus or a bad smell.   General tips  Do not cross your legs. This may decrease blood flow to your feet.  Do not use heating pads or hot water bottles on your feet. They may burn your skin. If you have lost feeling in your feet or legs, you may not know this is happening until it is too late.  Protect your feet from hot and cold by wearing shoes, such as at the beach or on hot pavement.  Schedule a complete foot exam at least once a year (annually) or more often if you have foot problems. Report any cuts, sores, or bruises to your health care provider immediately. Where to find more information  American Diabetes Association: www.diabetes.org  Association of Diabetes Care & Education Specialists: www.diabeteseducator.org Contact a health care provider if:  You have a medical condition that increases your risk of infection and   you have any cuts, sores, or bruises on your feet.  You have an injury that is not healing.  You have redness on your legs or feet.  You feel burning or tingling in your legs or feet.  You have pain or cramps in your legs and feet.  Your legs or feet are numb.  Your feet always feel cold.  You have pain around any toenails. Get help right away if:  You have a wound, scrape, corn, or callus on your foot and: ? You have pain, swelling, or redness that gets worse. ? You have fluid or blood coming from the wound, scrape, corn, or callus. ? Your wound, scrape, corn, or callus feels warm to the touch. ? You have pus or a bad smell coming from  the wound, scrape, corn, or callus. ? You have a fever. ? You have a red line going up your leg. Summary  Check your feet every day for blisters, cuts, bruises, sores, and redness.  Apply a moisturizing lotion or petroleum jelly to the skin on your feet and to dry, brittle toenails.  Wear shoes that fit properly and have enough cushioning.  If you have foot problems, report any cuts, sores, or bruises to your health care provider immediately.  Schedule a complete foot exam at least once a year (annually) or more often if you have foot problems. This information is not intended to replace advice given to you by your health care provider. Make sure you discuss any questions you have with your health care provider. Document Revised: 11/30/2019 Document Reviewed: 11/30/2019 Elsevier Patient Education  2021 Elsevier Inc.  

## 2020-08-13 NOTE — Progress Notes (Signed)
Subjective:    Patient ID: Tyler Garner, male    DOB: [birth year], 65 y.o.   MRN: [id number]   Chief Complaint: Follow up for chronic disease.    HPI:  1. Primary hypertension On lisinopril and metoprolol tartrate. Tolerating well. No issues with hypotension. Denies HA, Sob.  BP Readings from Last 3 Encounters:  08/13/20 (!) 150/81  05/28/20 128/82  02/22/20 135/84    2. Type 2 diabetes mellitus with hyperglycemia, without long-term current use of insulin (HCC) On glimepiride, janumet. No complaints of low blood sugars. Does not usually stay low carb low sugar.  Lab Results  Component Value Date   HGBA1C 7.3 (H) 05/28/2020    3. Mixed hyperlipidemia On rosuvastatin. Tolerating well.   Lab Results  Component Value Date   CHOL 132 05/28/2020   HDL 22 (L) 05/28/2020   LDLCALC 61 05/28/2020   TRIG 311 (H) 05/28/2020   CHOLHDL 6.0 (H) 05/28/2020     4. Atherosclerosis of native coronary artery of native heart with angina pectoris with documented spasm (HCC) Daily ASA. No complaints of CP, SOB, No CP on exertion.   5. Morbid obesity ([place]) Is not able to exercise due to pain in legs and back   BMI Readings from Last 2 Encounters:  08/13/20 33.43 kg/m  05/28/20 33.43 kg/m   Wt Readings from Last 3 Encounters:  08/13/20 233 lb (105.7 kg)  05/28/20 233 lb (105.7 kg)  02/22/20 233 lb (105.7 kg)    6. Hx of Myocardial Infarction Does not follow up with cardiology. Was told by cardiology he could be seen PRN.    Outpatient Encounter Medications as of 08/13/2020  Medication Sig  . amLODipine (NORVASC) 5 MG tablet Take 1 tablet (5 mg total) by mouth daily.  Marland Kitchen aspirin 81 MG tablet Take 81 mg by mouth daily.  Marland Kitchen glimepiride (AMARYL) 4 MG tablet Take 1 tablet (4 mg total) by mouth 2 (two) times daily.  Marland Kitchen glucose blood (ACCU-CHEK ACTIVE STRIPS) test strip Check blood sugar daily and prn  Dx E11.9  . insulin glargine (LANTUS SOLOSTAR) 100 UNIT/ML Solostar Pen Inject 60  Units into the skin 2 (two) times daily.  . Insulin Pen Needle (PEN NEEDLES) 31G X 8 MM MISC Use with lantus solosar pen daily  . JANUMET 50-1000 MG tablet TAKE ONE TABLET BY MOUTH TWICE A DAY  . lisinopril (ZESTRIL) 10 MG tablet Take 1 tablet (10 mg total) by mouth daily.  . metoprolol tartrate (LOPRESSOR) 25 MG tablet Take 1 tablet (25 mg total) by mouth 2 (two) times daily.  . pregabalin (LYRICA) 50 MG capsule 1 qhs X7 days , then 2 qhs X 7d, then 3 qhs X 7d, then 4 qhs  . rosuvastatin (CRESTOR) 40 MG tablet Take 0.5 tablets (20 mg total) by mouth daily.   No facility-administered encounter medications on file as of 08/13/2020.    Past Surgical History:  Procedure Laterality Date  .  heart stent    . LEFT HEART CATHETERIZATION WITH CORONARY ANGIOGRAM N/A 06/21/2013   Procedure: LEFT HEART CATHETERIZATION WITH CORONARY ANGIOGRAM;  Surgeon: Peter M Martinique, MD;  Location: [facility] CATH LAB;  Service: Cardiovascular;  Laterality: N/A;    Family History  Problem Relation Age of Onset  . Diabetes Mother   . ALS Mother   . Alzheimer's disease Father   . Diabetes Brother     New complaints: Back and leg pain. Would like to discuss a referral.   Social history: Wife  is deceased.   Controlled substance contract: N/A    Review of Systems  Constitutional: Negative for fatigue and fever.  Respiratory: Negative for cough, shortness of breath and wheezing.   Cardiovascular: Negative for chest pain, palpitations and leg swelling.  Gastrointestinal: Negative for abdominal pain, constipation and diarrhea.  Genitourinary: Positive for frequency. Negative for difficulty urinating, dysuria and hematuria.  Musculoskeletal: Positive for back pain and joint swelling.  Neurological: Positive for numbness. Negative for dizziness, light-headedness and headaches.       Intermittent hand numbness.  Psychiatric/Behavioral: Negative for confusion. The patient is not nervous/anxious.    Todays A1c 7.2%     Pt declined foot exam. He states there are no issues with his feet.  Objective:   Physical Exam Constitutional:      Appearance: Normal appearance. He is obese.  HENT:     Mouth/Throat:     Mouth: Mucous membranes are moist.     Pharynx: Oropharynx is clear.  Eyes:     Pupils: Pupils are equal, round, and reactive to light.  Neck:     Vascular: No carotid bruit.  Cardiovascular:     Rate and Rhythm: Normal rate and regular rhythm.     Pulses: Normal pulses.     Heart sounds: Normal heart sounds.  Pulmonary:     Effort: Pulmonary effort is normal.     Breath sounds: Normal breath sounds.  Abdominal:     General: Bowel sounds are normal.     Palpations: Abdomen is soft.  Musculoskeletal:     Cervical back: Normal range of motion and neck supple.  Skin:    General: Skin is warm and dry.     Capillary Refill: Capillary refill takes less than 2 seconds.  Neurological:     Mental Status: He is alert and oriented to person, place, and time.  Psychiatric:        Mood and Affect: Mood normal.        Behavior: Behavior normal.        Thought Content: Thought content normal.        Judgment: Judgment normal.   BP (!) 150/81   Pulse 75   Temp 98.9 F (37.2 C) (Temporal)   Resp 20   Ht 5' 10"  (1.778 m)   Wt 233 lb (105.7 kg)   SpO2 99%   BMI 33.43 kg/m       Assessment & Plan:  [name] comes in today with chief complaint of Medical Management of Chronic Issues   Diagnosis and orders addressed:  1. Primary hypertension Low sodium diet - CBC with Differential/Platelet - CMP14+EGFR - amLODipine (NORVASC) 5 MG tablet; Take 1 tablet (5 mg total) by mouth daily.  Dispense: 90 tablet; Refill: 1 - lisinopril (ZESTRIL) 20 MG tablet; Take 1 tablet (20 mg total) by mouth daily.  Dispense: 90 tablet; Refill: 1  2. Type 2 diabetes mellitus with hyperglycemia, without long-term current use of insulin (HCC) Continue to watch carbs in diet - Bayer DCA Hb A1c Waived -  Microalbumin / creatinine urine ratio - JANUMET 50-1000 MG tablet; Take 1 tablet by mouth 2 (two) times daily.  Dispense: 180 tablet; Refill: 1 - insulin glargine (LANTUS SOLOSTAR) 100 UNIT/ML Solostar Pen; Inject 60 Units into the skin 2 (two) times daily.  Dispense: 30 mL; Refill: 3 - glimepiride (AMARYL) 4 MG tablet; Take 1 tablet (4 mg total) by mouth 2 (two) times daily.  Dispense: 180 tablet; Refill: 1  3. Mixed  hyperlipidemia Low fat diet - Lipid panel - rosuvastatin (CRESTOR) 40 MG tablet; Take 0.5 tablets (20 mg total) by mouth daily.  Dispense: 135 tablet; Refill: 1  4. Atherosclerosis of native coronary artery of native heart with angina pectoris with documented spasm (HCC) - metoprolol tartrate (LOPRESSOR) 25 MG tablet; Take 1 tablet (25 mg total) by mouth 2 (two) times daily.  Dispense: 180 tablet; Refill: 1  5. Morbid obesity ([place]) Discussed diet and exercise for person with BMI >25 Will recheck weight in 3-6 months  6. Hx of myocardial infarction  7. Chronic midline low back pain without sciatica Moist heat Rest no bending or stooping - Ambulatory referral to Orthopedic Surgery   Labs pending Health Maintenance reviewed Diet and exercise encouraged  Follow up plan: 3 months   [place], [name]

## 2020-08-14 LAB — CBC WITH DIFFERENTIAL/PLATELET
Basophils Absolute: 0.1 10*3/uL (ref 0.0–0.2)
Basos: 1 %
EOS (ABSOLUTE): 0.1 10*3/uL (ref 0.0–0.4)
Eos: 2 %
Hematocrit: 40 % (ref 37.5–51.0)
Hemoglobin: 13 g/dL (ref 13.0–17.7)
Immature Grans (Abs): 0 10*3/uL (ref 0.0–0.1)
Immature Granulocytes: 0 %
Lymphocytes Absolute: 2.5 10*3/uL (ref 0.7–3.1)
Lymphs: 38 %
MCH: 29 pg (ref 26.6–33.0)
MCHC: 32.5 g/dL (ref 31.5–35.7)
MCV: 89 fL (ref 79–97)
Monocytes Absolute: 0.6 10*3/uL (ref 0.1–0.9)
Monocytes: 9 %
Neutrophils Absolute: 3.3 10*3/uL (ref 1.4–7.0)
Neutrophils: 50 %
Platelets: 268 10*3/uL (ref 150–450)
RBC: 4.49 x10E6/uL (ref 4.14–5.80)
RDW: 15 % (ref 11.6–15.4)
WBC: 6.5 10*3/uL (ref 3.4–10.8)

## 2020-08-14 LAB — CMP14+EGFR
ALT: 23 IU/L (ref 0–44)
AST: 27 IU/L (ref 0–40)
Albumin/Globulin Ratio: 1.9 (ref 1.2–2.2)
Albumin: 4.7 g/dL (ref 3.8–4.8)
Alkaline Phosphatase: 53 IU/L (ref 44–121)
BUN/Creatinine Ratio: 19 (ref 10–24)
BUN: 13 mg/dL (ref 8–27)
Bilirubin Total: 0.2 mg/dL (ref 0.0–1.2)
CO2: 23 mmol/L (ref 20–29)
Calcium: 9.8 mg/dL (ref 8.6–10.2)
Chloride: 102 mmol/L (ref 96–106)
Creatinine, Ser: 0.7 mg/dL — ABNORMAL LOW (ref 0.76–1.27)
Globulin, Total: 2.5 g/dL (ref 1.5–4.5)
Glucose: 125 mg/dL — ABNORMAL HIGH (ref 65–99)
Potassium: 5.3 mmol/L — ABNORMAL HIGH (ref 3.5–5.2)
Sodium: 143 mmol/L (ref 134–144)
Total Protein: 7.2 g/dL (ref 6.0–8.5)
eGFR: 102 mL/min/{1.73_m2} (ref >59)

## 2020-08-14 LAB — LIPID PANEL
Chol/HDL Ratio: 7 ratio — ABNORMAL HIGH (ref 0.0–5.0)
Cholesterol, Total: 140 mg/dL (ref 100–199)
HDL: 20 mg/dL — ABNORMAL LOW (ref >39)
LDL Chol Calc (NIH): 71 mg/dL (ref 0–99)
Triglycerides: 300 mg/dL — ABNORMAL HIGH (ref 0–149)
VLDL Cholesterol Cal: 49 mg/dL — ABNORMAL HIGH (ref 5–40)

## 2020-08-23 ENCOUNTER — Encounter: Payer: Medicare Other | Admitting: Nurse Practitioner

## 2020-09-18 DIAGNOSIS — M545 Low back pain, unspecified: Secondary | ICD-10-CM | POA: Insufficient documentation

## 2020-09-18 DIAGNOSIS — M5459 Other low back pain: Secondary | ICD-10-CM | POA: Diagnosis not present

## 2020-10-12 DIAGNOSIS — M545 Low back pain, unspecified: Secondary | ICD-10-CM | POA: Diagnosis not present

## 2020-10-25 DIAGNOSIS — M5416 Radiculopathy, lumbar region: Secondary | ICD-10-CM | POA: Diagnosis not present

## 2020-10-25 DIAGNOSIS — M5136 Other intervertebral disc degeneration, lumbar region: Secondary | ICD-10-CM | POA: Diagnosis not present

## 2020-10-25 DIAGNOSIS — M48062 Spinal stenosis, lumbar region with neurogenic claudication: Secondary | ICD-10-CM | POA: Diagnosis not present

## 2020-11-08 DIAGNOSIS — M48062 Spinal stenosis, lumbar region with neurogenic claudication: Secondary | ICD-10-CM | POA: Diagnosis not present

## 2020-11-12 ENCOUNTER — Ambulatory Visit: Payer: Self-pay | Admitting: Orthopedic Surgery

## 2020-11-18 ENCOUNTER — Ambulatory Visit (INDEPENDENT_AMBULATORY_CARE_PROVIDER_SITE_OTHER): Payer: Medicare Other | Admitting: Nurse Practitioner

## 2020-11-18 ENCOUNTER — Other Ambulatory Visit: Payer: Self-pay

## 2020-11-18 ENCOUNTER — Encounter: Payer: Self-pay | Admitting: Nurse Practitioner

## 2020-11-18 ENCOUNTER — Ambulatory Visit (INDEPENDENT_AMBULATORY_CARE_PROVIDER_SITE_OTHER): Payer: Medicare Other

## 2020-11-18 VITALS — BP 139/77 | HR 69 | Temp 97.4°F | Resp 20 | Ht 70.0 in | Wt 232.0 lb

## 2020-11-18 DIAGNOSIS — Z01818 Encounter for other preprocedural examination: Secondary | ICD-10-CM | POA: Diagnosis not present

## 2020-11-18 DIAGNOSIS — Z794 Long term (current) use of insulin: Secondary | ICD-10-CM

## 2020-11-18 DIAGNOSIS — E119 Type 2 diabetes mellitus without complications: Secondary | ICD-10-CM | POA: Diagnosis not present

## 2020-11-18 DIAGNOSIS — I10 Essential (primary) hypertension: Secondary | ICD-10-CM | POA: Diagnosis not present

## 2020-11-18 DIAGNOSIS — I25111 Atherosclerotic heart disease of native coronary artery with angina pectoris with documented spasm: Secondary | ICD-10-CM | POA: Diagnosis not present

## 2020-11-18 DIAGNOSIS — E782 Mixed hyperlipidemia: Secondary | ICD-10-CM | POA: Diagnosis not present

## 2020-11-18 DIAGNOSIS — E1165 Type 2 diabetes mellitus with hyperglycemia: Secondary | ICD-10-CM | POA: Diagnosis not present

## 2020-11-18 MED ORDER — AMLODIPINE BESYLATE 5 MG PO TABS: 5.0000 mg | ORAL_TABLET | Freq: Every day | ORAL | 1 refills | Status: DC

## 2020-11-18 MED ORDER — GLIMEPIRIDE 4 MG PO TABS: 4.0000 mg | ORAL_TABLET | Freq: Two times a day (BID) | ORAL | 1 refills | Status: DC

## 2020-11-18 MED ORDER — JANUMET 50-1000 MG PO TABS: 1.0000 | ORAL_TABLET | Freq: Two times a day (BID) | ORAL | 1 refills | Status: DC

## 2020-11-18 MED ORDER — LANTUS SOLOSTAR 100 UNIT/ML ~~LOC~~ SOPN: 60.0000 [IU] | PEN_INJECTOR | Freq: Two times a day (BID) | SUBCUTANEOUS | 3 refills | Status: DC

## 2020-11-18 MED ORDER — ROSUVASTATIN CALCIUM 40 MG PO TABS: 20.0000 mg | ORAL_TABLET | Freq: Every day | ORAL | 1 refills | Status: DC

## 2020-11-18 MED ORDER — LISINOPRIL 20 MG PO TABS: 20.0000 mg | ORAL_TABLET | Freq: Every day | ORAL | 1 refills | Status: DC

## 2020-11-18 MED ORDER — METOPROLOL TARTRATE 25 MG PO TABS: 25.0000 mg | ORAL_TABLET | Freq: Two times a day (BID) | ORAL | 1 refills | Status: DC

## 2020-11-18 NOTE — Progress Notes (Signed)
Subjective:    Patient ID: Tyler Garner, male    DOB: [birth year], 65 y.o.   MRN: [id number]   Chief Complaint: Medical Management of Chronic Issues    HPI:  1. Primary hypertension No c/o chest pain, sob or headache. Does not check blood pressure at home. BP Readings from Last 3 Encounters:  11/18/20 139/77  08/13/20 (!) 150/81  05/28/20 128/82     2. Mixed hyperlipidemia Does try to watch but does little to no exercise due  to back pain. Lab Results  Component Value Date   CHOL 140 08/13/2020   HDL 20 (L) 08/13/2020   LDLCALC 71 08/13/2020   TRIG 300 (H) 08/13/2020   CHOLHDL 7.0 (H) 08/13/2020      3. Uncontrolled type 2 diabetes mellitus with hyperglycemia ([place]) He does not check his blood sugars at home very often and doe snot wtach diet. Lab Results  Component Value Date   HGBA1C 7.2 (H) 08/13/2020    4. Atherosclerosis of native coronary artery of native heart with angina pectoris with documented spasm ([place]) Seen on chest xray- has not seen cardiology  5. BMI 33.0-33.9   No recent weight changes Wt Readings from Last 3 Encounters:  11/18/20 232 lb (105.2 kg)  08/13/20 233 lb (105.7 kg)  05/28/20 233 lb (105.7 kg)   BMI Readings from Last 3 Encounters:  11/18/20 33.29 kg/m  08/13/20 33.43 kg/m  05/28/20 33.43 kg/m       Outpatient Encounter Medications as of 11/18/2020  Medication Sig   acetaminophen (TYLENOL) 650 MG CR tablet Take 650 mg by mouth every 8 (eight) hours as needed for pain.   amLODipine (NORVASC) 5 MG tablet Take 1 tablet (5 mg total) by mouth daily.   aspirin 81 MG tablet Take 81 mg by mouth daily.   Aspirin-Acetaminophen-Caffeine (GOODY HEADACHE PO) Take 1 packet by mouth daily as needed (pain).   glimepiride (AMARYL) 4 MG tablet Take 1 tablet (4 mg total) by mouth 2 (two) times daily.   glucose blood (ACCU-CHEK ACTIVE STRIPS) test strip Check blood sugar daily and prn  Dx E11.9   insulin glargine (LANTUS SOLOSTAR) 100  UNIT/ML Solostar Pen Inject 60 Units into the skin 2 (two) times daily. (Patient taking differently: Inject 55 Units into the skin 2 (two) times daily.)   Insulin Pen Needle (PEN NEEDLES) 31G X 8 MM MISC Use with lantus solosar pen daily   JANUMET 50-1000 MG tablet Take 1 tablet by mouth 2 (two) times daily.   lisinopril (ZESTRIL) 20 MG tablet Take 1 tablet (20 mg total) by mouth daily.   metoprolol tartrate (LOPRESSOR) 25 MG tablet Take 1 tablet (25 mg total) by mouth 2 (two) times daily.   rosuvastatin (CRESTOR) 40 MG tablet Take 0.5 tablets (20 mg total) by mouth daily.   No facility-administered encounter medications on file as of 11/18/2020.    Past Surgical History:  Procedure Laterality Date    heart stent     LEFT HEART CATHETERIZATION WITH CORONARY ANGIOGRAM N/A 06/21/2013   Procedure: LEFT HEART CATHETERIZATION WITH CORONARY ANGIOGRAM;  Surgeon: Tyler M Martinique, MD;  Location: [facility] CATH LAB;  Service: Cardiovascular;  Laterality: N/A;    Family History  Problem Relation Age of Onset   Diabetes Mother    ALS Mother    Alzheimer's disease Father    Diabetes Brother     New complaints: Having back surgery next week  Social history: His son lives with him  Controlled substance  contract: n/a    Review of Systems  Constitutional:  Negative for diaphoresis.  Eyes:  Negative for pain.  Respiratory:  Negative for shortness of breath.   Cardiovascular:  Negative for chest pain, palpitations and leg swelling.  Gastrointestinal:  Negative for abdominal pain.  Endocrine: Negative for polydipsia.  Skin:  Negative for rash.  Neurological:  Negative for dizziness, weakness and headaches.  Hematological:  Does not bruise/bleed easily.  All other systems reviewed and are negative.     Objective:   Physical Exam Vitals and nursing note reviewed.  Constitutional:      Appearance: Normal appearance. He is well-developed.  HENT:     Head: Normocephalic.     Nose: Nose normal.   Eyes:     Pupils: Pupils are equal, round, and reactive to light.  Neck:     Thyroid: No thyroid mass or thyromegaly.     Vascular: No carotid bruit or JVD.     Trachea: Phonation normal.  Cardiovascular:     Rate and Rhythm: Normal rate and regular rhythm.  Pulmonary:     Effort: Pulmonary effort is normal. No respiratory distress.     Breath sounds: Normal breath sounds.  Abdominal:     General: Bowel sounds are normal.     Palpations: Abdomen is soft.     Tenderness: There is no abdominal tenderness.     Hernia: A hernia (ventral hernia- nontender and soft) is present.  Musculoskeletal:        General: Normal range of motion.     Cervical back: Normal range of motion and neck supple.     Comments: Rises slowly from sitting to standing Ambulates hunched over due to back pain  Lymphadenopathy:     Cervical: No cervical adenopathy.  Skin:    General: Skin is warm and dry.  Neurological:     Mental Status: He is alert and oriented to person, place, and time.  Psychiatric:        Behavior: Behavior normal.        Thought Content: Thought content normal.        Judgment: Judgment normal.   BP 139/77   Pulse 69   Temp (!) 97.4 F (36.3 C) (Temporal)   Resp 20   Ht 5' 10"  (1.778 m)   Wt 232 lb (105.2 kg)   SpO2 98%   BMI 33.29 kg/m   HGBA1C 6.8  EKG- NSR-Preliminary reading by Tyler Collum, [name]  [facility]   Chest xray- clear-Tyler Hassell Done, [name]      Assessment & Plan:   Tyler Garner comes in today with chief complaint of Medical Management of Chronic Issues   Diagnosis and orders addressed:  1. Primary hypertension Low sodium diet - CBC with Differential/Platelet - CMP14+EGFR - amLODipine (NORVASC) 5 MG tablet; Take 1 tablet (5 mg total) by mouth daily.  Dispense: 90 tablet; Refill: 1 - lisinopril (ZESTRIL) 20 MG tablet; Take 1 tablet (20 mg total) by mouth daily.  Dispense: 90 tablet; Refill: 1  2. Mixed hyperlipidemia Low fat diet - Lipid  panel - rosuvastatin (CRESTOR) 40 MG tablet; Take 0.5 tablets (20 mg total) by mouth daily.  Dispense: 135 tablet; Refill: 1  3. Type 2 diabetes mellitus without complication, with long-term current use of insulin (HCC) Continue to watch carbs in diet - Bayer DCA Hb A1c Waived - Microalbumin / creatinine urine ratio - insulin glargine (LANTUS SOLOSTAR) 100 UNIT/ML Solostar Pen; Inject 60 Units into the skin 2 (two)  times daily.  Dispense: 30 mL; Refill: 3 - glimepiride (AMARYL) 4 MG tablet; Take 1 tablet (4 mg total) by mouth 2 (two) times daily.  Dispense: 180 tablet; Refill: 1 - JANUMET 50-1000 MG tablet; Take 1 tablet by mouth 2 (two) times daily.  Dispense: 180 tablet; Refill: 1  4. Atherosclerosis of native coronary artery of native heart with angina pectoris with documented spasm (HCC) - metoprolol tartrate (LOPRESSOR) 25 MG tablet; Take 1 tablet (25 mg total) by mouth 2 (two) times daily.  Dispense: 180 tablet; Refill: 1  5. BMI 33.0-33.9 ([place]) Discussed diet and exercise for person with BMI >25 Will recheck weight in 3-6 months  6. Preoperative clearance Cleared for surgery - EKG 12-Lead - DG Chest 2 View   Labs pending Health Maintenance reviewed Diet and exercise encouraged  Follow up plan: 3 month   [place], [name]

## 2020-11-19 ENCOUNTER — Ambulatory Visit: Payer: Self-pay | Admitting: Orthopedic Surgery

## 2020-11-19 LAB — CBC WITH DIFFERENTIAL/PLATELET
Basophils Absolute: 0.1 10*3/uL (ref 0.0–0.2)
Basos: 1 %
EOS (ABSOLUTE): 0.2 10*3/uL (ref 0.0–0.4)
Eos: 2 %
Hematocrit: 38.2 % (ref 37.5–51.0)
Hemoglobin: 12.8 g/dL — ABNORMAL LOW (ref 13.0–17.7)
Immature Grans (Abs): 0 10*3/uL (ref 0.0–0.1)
Immature Granulocytes: 0 %
Lymphocytes Absolute: 2.9 10*3/uL (ref 0.7–3.1)
Lymphs: 44 %
MCH: 29.4 pg (ref 26.6–33.0)
MCHC: 33.5 g/dL (ref 31.5–35.7)
MCV: 88 fL (ref 79–97)
Monocytes Absolute: 0.5 10*3/uL (ref 0.1–0.9)
Monocytes: 8 %
Neutrophils Absolute: 3 10*3/uL (ref 1.4–7.0)
Neutrophils: 45 %
Platelets: 250 10*3/uL (ref 150–450)
RBC: 4.35 x10E6/uL (ref 4.14–5.80)
RDW: 16.9 % — ABNORMAL HIGH (ref 11.6–15.4)
WBC: 6.6 10*3/uL (ref 3.4–10.8)

## 2020-11-19 LAB — CMP14+EGFR
ALT: 15 IU/L (ref 0–44)
AST: 24 IU/L (ref 0–40)
Albumin/Globulin Ratio: 1.9 (ref 1.2–2.2)
Albumin: 4.7 g/dL (ref 3.8–4.8)
Alkaline Phosphatase: 53 IU/L (ref 44–121)
BUN/Creatinine Ratio: 18 (ref 10–24)
BUN: 16 mg/dL (ref 8–27)
Bilirubin Total: 0.4 mg/dL (ref 0.0–1.2)
CO2: 24 mmol/L (ref 20–29)
Calcium: 9.9 mg/dL (ref 8.6–10.2)
Chloride: 103 mmol/L (ref 96–106)
Creatinine, Ser: 0.87 mg/dL (ref 0.76–1.27)
Globulin, Total: 2.5 g/dL (ref 1.5–4.5)
Glucose: 98 mg/dL (ref 65–99)
Potassium: 5.8 mmol/L — ABNORMAL HIGH (ref 3.5–5.2)
Sodium: 144 mmol/L (ref 134–144)
Total Protein: 7.2 g/dL (ref 6.0–8.5)
eGFR: 95 mL/min/{1.73_m2} (ref >59)

## 2020-11-19 LAB — LIPID PANEL
Chol/HDL Ratio: 6 ratio — ABNORMAL HIGH (ref 0.0–5.0)
Cholesterol, Total: 132 mg/dL (ref 100–199)
HDL: 22 mg/dL — ABNORMAL LOW (ref >39)
LDL Chol Calc (NIH): 61 mg/dL (ref 0–99)
Triglycerides: 313 mg/dL — ABNORMAL HIGH (ref 0–149)
VLDL Cholesterol Cal: 49 mg/dL — ABNORMAL HIGH (ref 5–40)

## 2020-11-19 LAB — BAYER DCA HB A1C WAIVED: HB A1C (BAYER DCA - WAIVED): 6.8 % (ref <7.0)

## 2020-11-19 NOTE — H&P (Signed)
Subjective:   Location: bilateral Quality: aching Severity: pain level 10/10 Context: cannot identify Alleviating Factors: sitting Aggravating Factors: standing; walking; weightbearing Associated Symptoms: weakness; numbness; radiation down leg Previous Surgery: none Prior Imaging: x ray; MRI Previous Injections: none Previous PT: none H&P L2-L5 decompression 11/28/20  Patient Active Problem List   Diagnosis Date Noted   Morbid obesity (HCC) 05/24/2015   Arthritic-like pain 10/25/2014   Primary hypertension 07/18/2014   Uncontrolled diabetes mellitus (HCC) 07/18/2014   Coronary atherosclerosis of native coronary artery 06/24/2013   Hx of myocardial infarction 06/21/2013   Hyperlipidemia 08/27/2010   Past Medical History:  Diagnosis Date   Arthritis    CAD (coronary artery disease)    a. inferior STEMI (05/2013) with RV involvement- LHC:  dist CFX occluded (Promus premier (2.5x24 mm) DES); EF 40-45% with inf HK;  b. Echo (report pending) with EF 45-50%, lat WMA, RVE, mod RV hypokinesis    Diabetes mellitus without complication (HCC)    Hyperglycemia    Hyperlipidemia    Hypertension    Ischemic cardiomyopathy    Nicotine addiction    Renal colic    Right shoulder injury 10/11/09    Past Surgical History:  Procedure Laterality Date    heart stent     LEFT HEART CATHETERIZATION WITH CORONARY ANGIOGRAM N/A 06/21/2013   Procedure: LEFT HEART CATHETERIZATION WITH CORONARY ANGIOGRAM;  Surgeon: Peter M [place], MD;  Location: [facility] CATH LAB;  Service: Cardiovascular;  Laterality: N/A;    Current Outpatient Medications  Medication Sig Dispense Refill Last Dose   acetaminophen (TYLENOL) 650 MG CR tablet Take 650 mg by mouth every 8 (eight) hours as needed for pain.      amLODipine (NORVASC) 5 MG tablet Take 1 tablet (5 mg total) by mouth daily. 90 tablet 1    aspirin 81 MG tablet Take 81 mg by mouth daily.      Aspirin-Acetaminophen-Caffeine (GOODY HEADACHE PO) Take 1 packet by mouth  daily as needed (pain).      glimepiride (AMARYL) 4 MG tablet Take 1 tablet (4 mg total) by mouth 2 (two) times daily. 180 tablet 1    glucose blood (ACCU-CHEK ACTIVE STRIPS) test strip Check blood sugar daily and prn  Dx E11.9 100 each 12    insulin glargine (LANTUS SOLOSTAR) 100 UNIT/ML Solostar Pen Inject 60 Units into the skin 2 (two) times daily. 30 mL 3    Insulin Pen Needle (PEN NEEDLES) 31G X 8 MM MISC Use with lantus solosar pen daily 100 each 2    JANUMET 50-1000 MG tablet Take 1 tablet by mouth 2 (two) times daily. 180 tablet 1    lisinopril (ZESTRIL) 20 MG tablet Take 1 tablet (20 mg total) by mouth daily. 90 tablet 1    metoprolol tartrate (LOPRESSOR) 25 MG tablet Take 1 tablet (25 mg total) by mouth 2 (two) times daily. 180 tablet 1    rosuvastatin (CRESTOR) 40 MG tablet Take 0.5 tablets (20 mg total) by mouth daily. 135 tablet 1    No current facility-administered medications for this visit.   Allergies  Allergen Reactions   Percocet [Oxycodone-Acetaminophen] Nausea And Vomiting    Social History   Tobacco Use   Smoking status: Former    Pack years: 0.00   Smokeless tobacco: Former  Substance Use Topics   Alcohol use: Yes    Family History  Problem Relation Age of Onset   Diabetes Mother    ALS Mother    Alzheimer's disease Father  Diabetes Brother     Review of Systems Pertinent items are noted in HPI.  Objective:   Vitals: Ht: 5 ft 10 in BP: 120/80 O2Sat: 96%  General: AAOX3, well developed and well nourished, NAD Ambulation: antalgic forward flexed gait pattern, uses wheel chair assistive device in office. Heart: RRR, no rubs, murmers, or gallops Lungs: CTAB Abdomen: BSx4, Non-tender, non-distended, no rebound tenderness. Inspection: No obvious deformity Palpation: Non-tender over spinous processes and paraspinal muscles. NO significant SI joint tenderness. AROM: - Significant pain into legs with standing/walking, reduced with forward  flexion/sitting - Knee: flexion and extension normal and pain free bilaterally. - Ankle: Dorsiflexion, plantarflexion, inversion, eversion normal and pain free. Dermatomes: Lower extremity sensation to light touch abnormal with pain and dysethesias in bilateral LE L3, L4 dermatome pattern Right worse than left Myotomes: - Hip Flexion: Left 4+/5, Right 4+/5 - Knee Extension: Left 5/5, Right 5/5 - Ankle Dorsiflextion: Left 5/5, Right 5/5 - Ankle Plantarflexion: Left 5/5, Right 5/5 Reflexes: - Clonus: Negative Special Tests: - Straight Leg Raise: Left Negative, Right Negative PV: Extremities warm and well profused. Posterior and dorsalis pedis pulse 2+ bilaterally, No pitting Edema, discoloration, calf tenderness. X-Ray impression: Standing AP, lateral, spot lumbar films were taken at today's office visit and images were personally reviewed by me. No significant scoliosis. Normal sagittal alignment, no significant slip. No obvious can pression fracture is seen. Multilevel degenerative disc disease with severe degenerative disc disease at L5-S1.  MRI Impression: MRI of lumbar spine performed at [facility] Dated 10/12/2020. The images as well as the report were personally reviewed by me. Patient does have transitional anatomy. There is severe spinal canal stenosis at L3-4 as well as L4-5. At L2-3 there is moderate left subarticular zone narrowing impinging and mildly displacing the left L3 nerve root due to a small left-sided disc protrusion.  Assessment:   Tyler Garner is a very pleasant 65 year old gentleman who has significant back buttock and bilateral lower extremity dysesthesias. His clinical exam is consistent with lumbar spinal stenosis with neurogenic claudication. Imaging studies clearly demonstrate severe spinal stenosis L3-4 and L4-5. There is a slight left-sided hard disc osteophyte at L2-3 producing moderate left neuroforaminal narrowing and left lateral recess stenosis. At this point  time given the severity and duration of his symptoms he is interested in moving forward with surgery. I have gone over the surgical procedure which I feel will provide him the greatest relief. This would be a multilevel lumbar decompression. His primary area would be L3-5. Also evaluate the lateral recess on the left side at L2-3 given that small disc protrusion. I have gone over the risks, benefits, alternatives to surgery with the patient in great detail and all of his and his son's questions were addressed. Risks and benefits of decompression/discectomy: Infection, bleeding, death, stroke, paralysis, ongoing or worse pain, need for additional surgery, leak of spinal fluid, adjacent segment degeneration requiring additional surgery, post-operative hematoma formation that can result in neurological compromise and the need for urgent/emergent re-operation. Loss in bowel and bladder control. Injury to major vessels that could result in the need for urgent abdominal surgery to stop bleeding. Risk of deep venous thrombosis (DVT) and the need for additional treatment. Recurrent disc herniation resulting in the need for revision surgery, which could include fusion surgery (utilizing instrumentation such as pedicle screws and intervertebral cages). Additional risk: If instrumentation is used there is a risk of migration, or breakage of that hardware that could require additional surgery.   Plan:  Patient is scheduled for LSO brace fitting with PT tomorrow.  We have obtained preoperative medical clearance from his primary care provider.  Patient is diabetic, his last A1c is 6.8. He does take aspirin, he knows to hold this 7 days prior to surgery. We will plan to restart 48 hours after surgery.  We have also discussed the post-operative recovery period to include: bathing/showering restrictions, wound healing, activity (and driving) restrictions, medications/pain mangement.  We have also discussed  post-operative redflags to include: signs and symptoms of postoperative infection, DVT/PE.  Discharge instructions were reviewed with the patient and he was given a copy of today's office visit.  All of patient's questions were invited and answered  Follow-up: 2 weeks postop

## 2020-11-20 DIAGNOSIS — M5416 Radiculopathy, lumbar region: Secondary | ICD-10-CM | POA: Diagnosis not present

## 2020-11-22 NOTE — Pre-Procedure Instructions (Signed)
Surgical Instructions    Your procedure is scheduled on Thursday July 7th.  Report to [facility] Main Entrance "A" at 05:30 A.M., then check in with the Admitting office.  Call this number if you have problems the morning of surgery:  [phone]   If you have any questions prior to your surgery date call [phone]: Open Monday-Friday 8am-4pm    Remember:  Do not eat or drink after midnight the night before your surgery     Take these medicines the morning of surgery with A SIP OF WATER   acetaminophen (TYLENOL)- If needed  amLODipine (NORVASC)  metoprolol tartrate (LOPRESSOR)  rosuvastatin (CRESTOR)   As of today, STOP taking any Aspirin (unless otherwise instructed by your surgeon) Aleve, Naproxen, Ibuprofen, Motrin, Advil, Goody's, BC's, all herbal medications, fish oil, and all vitamins. WHAT DO I DO ABOUT MY DIABETES MEDICATION?   Do not take oral diabetes medicines (pills) the morning of surgery.  DO NOT TAKE the evening dose of glimepiride (AMARYL) the day before surgery and do not take glimepiride (AMARYL) the morning of surgery.   DO NOT TAKE JANUMET the morning of surgery.  THE NIGHT BEFORE SURGERY, do not take your evening dose of insulin glargine (LANTUS SOLOSTAR)       THE MORNING OF SURGERY, do not take insulin glargine (LANTUS SOLOSTAR).  The day of surgery, do not take other diabetes injectables, including Byetta (exenatide), Bydureon (exenatide ER), Victoza (liraglutide), or Trulicity (dulaglutide).  If your CBG is greater than 220 mg/dL, you may take  of your sliding scale (correction) dose of insulin.   HOW TO MANAGE YOUR DIABETES BEFORE AND AFTER SURGERY  Why is it important to control my blood sugar before and after surgery? Improving blood sugar levels before and after surgery helps healing and can limit problems. A way of improving blood sugar control is eating a healthy diet by:  Eating less sugar and carbohydrates  Increasing  activity/exercise  Talking with your doctor about reaching your blood sugar goals High blood sugars (greater than 180 mg/dL) can raise your risk of infections and slow your recovery, so you will need to focus on controlling your diabetes during the weeks before surgery. Make sure that the doctor who takes care of your diabetes knows about your planned surgery including the date and location.  How do I manage my blood sugar before surgery? Check your blood sugar at least 4 times a day, starting 2 days before surgery, to make sure that the level is not too high or low.  Check your blood sugar the morning of your surgery when you wake up and every 2 hours until you get to the Short Stay unit.  If your blood sugar is less than 70 mg/dL, you will need to treat for low blood sugar: Do not take insulin. Treat a low blood sugar (less than 70 mg/dL) with  cup of clear juice (cranberry or apple), 4 glucose tablets, OR glucose gel. Recheck blood sugar in 15 minutes after treatment (to make sure it is greater than 70 mg/dL). If your blood sugar is not greater than 70 mg/dL on recheck, call [phone] for further instructions. Report your blood sugar to the short stay nurse when you get to Short Stay.  If you are admitted to the hospital after surgery: Your blood sugar will be checked by the staff and you will probably be given insulin after surgery (instead of oral diabetes medicines) to make sure you have good blood sugar levels. The  goal for blood sugar control after surgery is 80-180 mg/dL.                      Do NOT Smoke (Tobacco/Vaping) or drink Alcohol 24 hours prior to your procedure.  If you use a CPAP at night, you may bring all equipment for your overnight stay.   Contacts, glasses, piercing's, hearing aid's, dentures or partials may not be worn into surgery, please bring cases for these belongings.    For patients admitted to the hospital, discharge time will be determined by your  treatment team.   Patients discharged the day of surgery will not be allowed to drive home, and someone needs to stay with them for 24 hours.  ONLY 1 SUPPORT PERSON MAY BE PRESENT WHILE YOU ARE IN SURGERY. IF YOU ARE TO BE ADMITTED ONCE YOU ARE IN YOUR ROOM YOU WILL BE ALLOWED TWO (2) VISITORS.  Minor children may have two parents present. Special consideration for safety and communication needs will be reviewed on a case by case basis.   Special instructions:   [place]- Preparing For Surgery  Before surgery, you can play an important role. Because skin is not sterile, your skin needs to be as free of germs as possible. You can reduce the number of germs on your skin by washing with CHG (chlorahexidine gluconate) Soap before surgery.  CHG is an antiseptic cleaner which kills germs and bonds with the skin to continue killing germs even after washing.    Oral Hygiene is also important to reduce your risk of infection.  Remember - BRUSH YOUR TEETH THE MORNING OF SURGERY WITH YOUR REGULAR TOOTHPASTE  Please do not use if you have an allergy to CHG or antibacterial soaps. If your skin becomes reddened/irritated stop using the CHG.  Do not shave (including legs and underarms) for at least 48 hours prior to first CHG shower. It is OK to shave your face.  Please follow these instructions carefully.   Shower the NIGHT BEFORE SURGERY and the MORNING OF SURGERY  If you chose to wash your hair, wash your hair first as usual with your normal shampoo.  After you shampoo, rinse your hair and body thoroughly to remove the shampoo.  Use CHG Soap as you would any other liquid soap. You can apply CHG directly to the skin and wash gently with a scrungie or a clean washcloth.   Apply the CHG Soap to your body ONLY FROM THE NECK DOWN.  Do not use on open wounds or open sores. Avoid contact with your eyes, ears, mouth and genitals (private parts). Wash Face and genitals (private parts)  with your normal  soap.   Wash thoroughly, paying special attention to the area where your surgery will be performed.  Thoroughly rinse your body with warm water from the neck down.  DO NOT shower/wash with your normal soap after using and rinsing off the CHG Soap.  Pat yourself dry with a CLEAN TOWEL.  Wear CLEAN PAJAMAS to bed the night before surgery  Place CLEAN SHEETS on your bed the night before your surgery  DO NOT SLEEP WITH PETS.   Day of Surgery: Shower with CHG soap. Do not wear jewelry, make up, nail polish, gel polish, artificial nails, or any other type of covering on natural nails including finger and toenails. If patients have artificial nails, gel coating, etc. that need to be removed by a nail salon please have this removed prior to surgery.  Surgery may need to be canceled/delayed if the surgeon/ anesthesia feels like the patient is unable to be adequately monitored. Do not wear lotions, powders, perfumes/colognes, or deodorant. Do not shave 48 hours prior to surgery.  Men may shave face and neck. Do not bring valuables to the hospital. [facility] is not responsible for any belongings or valuables. Wear Clean/Comfortable clothing the morning of surgery Remember to brush your teeth WITH YOUR REGULAR TOOTHPASTE.   Please read over the following fact sheets that you were given.

## 2020-11-26 ENCOUNTER — Ambulatory Visit (HOSPITAL_COMMUNITY): Admission: RE | Admit: 2020-11-26 | Payer: Medicare Other | Source: Ambulatory Visit

## 2020-11-26 ENCOUNTER — Encounter (HOSPITAL_COMMUNITY)
Admission: RE | Admit: 2020-11-26 | Discharge: 2020-11-26 | Disposition: A | Discharge status: Home / Self Care | Payer: Medicare Other | Source: Ambulatory Visit | Attending: Orthopedic Surgery | Admitting: Orthopedic Surgery

## 2020-11-26 ENCOUNTER — Encounter (HOSPITAL_COMMUNITY): Payer: Self-pay

## 2020-11-26 ENCOUNTER — Other Ambulatory Visit: Payer: Self-pay

## 2020-11-26 DIAGNOSIS — I2510 Atherosclerotic heart disease of native coronary artery without angina pectoris: Secondary | ICD-10-CM | POA: Diagnosis not present

## 2020-11-26 DIAGNOSIS — E119 Type 2 diabetes mellitus without complications: Secondary | ICD-10-CM | POA: Insufficient documentation

## 2020-11-26 DIAGNOSIS — I255 Ischemic cardiomyopathy: Secondary | ICD-10-CM | POA: Diagnosis not present

## 2020-11-26 DIAGNOSIS — I10 Essential (primary) hypertension: Secondary | ICD-10-CM | POA: Insufficient documentation

## 2020-11-26 DIAGNOSIS — E785 Hyperlipidemia, unspecified: Secondary | ICD-10-CM | POA: Diagnosis not present

## 2020-11-26 DIAGNOSIS — Z20822 Contact with and (suspected) exposure to covid-19: Secondary | ICD-10-CM | POA: Insufficient documentation

## 2020-11-26 DIAGNOSIS — Z794 Long term (current) use of insulin: Secondary | ICD-10-CM | POA: Diagnosis not present

## 2020-11-26 DIAGNOSIS — I252 Old myocardial infarction: Secondary | ICD-10-CM | POA: Diagnosis not present

## 2020-11-26 DIAGNOSIS — Z01812 Encounter for preprocedural laboratory examination: Secondary | ICD-10-CM | POA: Diagnosis not present

## 2020-11-26 DIAGNOSIS — Z01818 Encounter for other preprocedural examination: Secondary | ICD-10-CM

## 2020-11-26 LAB — BASIC METABOLIC PANEL
Anion gap: 9 (ref 5–15)
BUN: 12 mg/dL (ref 8–23)
CO2: 26 mmol/L (ref 22–32)
Calcium: 9.2 mg/dL (ref 8.9–10.3)
Chloride: 106 mmol/L (ref 98–111)
Creatinine, Ser: 0.68 mg/dL (ref 0.61–1.24)
GFR, Estimated: >60 mL/min (ref >60)
Glucose, Bld: 89 mg/dL (ref 70–99)
Potassium: 4 mmol/L (ref 3.5–5.1)
Sodium: 141 mmol/L (ref 135–145)

## 2020-11-26 LAB — CBC
HCT: 39.7 % (ref 39.0–52.0)
Hemoglobin: 12.8 g/dL — ABNORMAL LOW (ref 13.0–17.0)
MCH: 29.4 pg (ref 26.0–34.0)
MCHC: 32.2 g/dL (ref 30.0–36.0)
MCV: 91.1 fL (ref 80.0–100.0)
Platelets: 272 10*3/uL (ref 150–400)
RBC: 4.36 MIL/uL (ref 4.22–5.81)
RDW: 17.2 % — ABNORMAL HIGH (ref 11.5–15.5)
WBC: 6.8 10*3/uL (ref 4.0–10.5)
nRBC: 0 % (ref 0.0–0.2)

## 2020-11-26 LAB — APTT: aPTT: 28 seconds (ref 24–36)

## 2020-11-26 LAB — SARS CORONAVIRUS 2 (TAT 6-24 HRS): SARS Coronavirus 2: NEGATIVE

## 2020-11-26 LAB — SURGICAL PCR SCREEN
MRSA, PCR: NEGATIVE
Staphylococcus aureus: NEGATIVE

## 2020-11-26 LAB — URINALYSIS, ROUTINE W REFLEX MICROSCOPIC
Bilirubin Urine: NEGATIVE
Glucose, UA: NEGATIVE mg/dL
Hgb urine dipstick: NEGATIVE
Ketones, ur: 5 mg/dL — AB
Leukocytes,Ua: NEGATIVE
Nitrite: NEGATIVE
Protein, ur: NEGATIVE mg/dL
Specific Gravity, Urine: 1.019 (ref 1.005–1.030)
pH: 5 (ref 5.0–8.0)

## 2020-11-26 LAB — PROTIME-INR
INR: 1 (ref 0.8–1.2)
Prothrombin Time: 13.5 seconds (ref 11.4–15.2)

## 2020-11-26 LAB — GLUCOSE, CAPILLARY: Glucose-Capillary: 93 mg/dL (ref 70–99)

## 2020-11-26 NOTE — Progress Notes (Signed)
PCP - Bennie Pierini, [name] Cardiologist - Rollene Rotunda, MD. Last visit was in 2017 per patient.   PPM/ICD - n/a Device Orders - n/a Rep Notified - n/a  Chest x-ray - 11/18/20 EKG - 11/18/20 Stress Test -  ECHO - 06/22/13 Cardiac Cath - 06/21/13  Sleep Study - denies CPAP - n/a  Fasting Blood Sugar - 93 Checks Blood Sugar "hardly ever" per patient. Patient states he does not regularly check his blood sugar and that Sheron Nightingale, [name] is aware of this. Patient is aware to check his blood sugar leading up to surgery and the morning of surgery.    Blood Thinner Instructions: n/a Aspirin Instructions: Patient states he stopped taking Aspirin on Thursday 11/21/20 per his surgeon's instructions.   ERAS Protcol - NPO after midnight   COVID TEST- 11/26/20 at PAT appointment. Pending.   Anesthesia review: Yes. Dr. Shon Baton patient and clearance from Sheron Nightingale, [name]. Note in Epic 11/18/20  Patient denies shortness of breath, fever, cough and chest pain at PAT appointment   All instructions explained to the patient, with a verbal understanding of the material. Patient agrees to go over the instructions while at home for a better understanding. Patient also instructed to self quarantine after being tested for COVID-19. The opportunity to ask questions was provided.

## 2020-11-27 NOTE — Anesthesia Preprocedure Evaluation (Addendum)
Anesthesia Evaluation  Patient identified by MRN, date of birth, ID band Patient awake    Reviewed: Allergy & Precautions, H&P , NPO status , Patient's Chart, lab work & pertinent test results, reviewed documented beta blocker date and time   Airway Mallampati: III  TM Distance: >3 FB Neck ROM: Full    Dental no notable dental hx. (+) Teeth Intact, Dental Advisory Given   Pulmonary neg pulmonary ROS, former smoker,    Pulmonary exam normal breath sounds clear to auscultation       Cardiovascular Exercise Tolerance: Good hypertension, Pt. on medications and Pt. on home beta blockers + CAD, + Past MI and + Cardiac Stents   Rhythm:Regular Rate:Normal     Neuro/Psych negative neurological ROS  negative psych ROS   GI/Hepatic negative GI ROS, Neg liver ROS,   Endo/Other  diabetes, Insulin Dependent, Oral Hypoglycemic Agents  Renal/GU negative Renal ROS  negative genitourinary   Musculoskeletal  (+) Arthritis ,   Abdominal   Peds  Hematology negative hematology ROS (+)   Anesthesia Other Findings   Reproductive/Obstetrics negative OB ROS                          Anesthesia Physical Anesthesia Plan  ASA: 3  Anesthesia Plan: General   Post-op Pain Management:    Induction: Intravenous  PONV Risk Score and Plan: 3 and Ondansetron, Dexamethasone and Midazolam  Airway Management Planned: Oral ETT  Additional Equipment:   Intra-op Plan:   Post-operative Plan: Extubation in OR  Informed Consent: I have reviewed the patients History and Physical, chart, labs and discussed the procedure including the risks, benefits and alternatives for the proposed anesthesia with the patient or authorized representative who has indicated his/her understanding and acceptance.     Dental advisory given  Plan Discussed with: CRNA  Anesthesia Plan Comments: (PAT note by Antionette Poles, PA-C: Hx of CAD s/p  MI in January of 2015 with drug-eluting stent placement to the circumflex, ischemic cardiomyopathy (EF 45-50%), HLD, HTN. He had an exercise treadmill test 2016 and this was negative for evidence of ischemia. Last seen by Dr. Antoine Poche 12/25/2015 and per note, "CAD: The patient has no new sypmtoms. No further cardiovascular testing is indicated. ISCHEMIC CARDIOMYOPATHY (EF 45 - 50%) He has had no symptoms. No further imaging is indicated. No change in therapy is planned."  Since that time he has been followed by PCP [name] Daphine Deutscher, [name] for management of chronic conditions. Pt was seen 11/18/20 for preop clearance. Per note, "Preoperative clearance: Cleared for surgery." Copy of formal clearance letter also on chart.   IDDMII well controlled, A1c 6.8 11/18/20.  Preop labs reviewed, unremarkable.  EKG 11/18/20: Sinus  Rhythm. Rate 72. Low voltage in limb leads.  -Old anterior infarct.   Exercise tolerance test 11/20/14: . There was no ST segment deviation noted during stress.  Negative, adequate GXT with the patient exercising to a peak HR 139 (86% APMHR) and a workload of 8.7 mets. No chest pain or ECG changes. Duke treadmill score 8.  PCI 06/21/2013: PCI Data: Vessel - Dominant LCx/Segment - distal Percent Stenosis (pre) 100% TIMI-flow 0 Stent 2.5 x 24 mm Promus stent. Percent Stenosis (post) 0% TIMI-flow (post) 3  Final Conclusions:  1. Single vessel obstructive CAD 2. Mild to moderate LV dysfunction. 3. Successful stenting of the distal LCx with reperfusion of the PDA and PLOM branches  Recommendations: Dual antiplatelet therapy for one year.  )  Anesthesia Quick Evaluation  

## 2020-11-27 NOTE — Progress Notes (Signed)
Anesthesia Chart Review:  Hx of CAD s/p MI in January of 2015 with drug-eluting stent placement to the circumflex, ischemic cardiomyopathy (EF 45-50%), HLD, HTN.  He had an exercise treadmill test 2016 and this was negative for evidence of ischemia. Last seen by Dr. Antoine Poche 12/25/2015 and per note, "CAD: The patient has no new sypmtoms.  No further cardiovascular testing is indicated. ISCHEMIC CARDIOMYOPATHY (EF 45 - 50%) He has had no symptoms.  No further imaging is indicated.  No change in therapy is planned."  Since that time he has been followed by PCP [name] Daphine Deutscher, [name] for management of chronic conditions. Pt was seen 11/18/20 for preop clearance. Per note, "Preoperative clearance: Cleared for surgery." Copy of formal clearance letter also on chart.   IDDMII well controlled, A1c 6.8 11/18/20.  Preop labs reviewed, unremarkable.  EKG 11/18/20: Sinus  Rhythm. Rate 72. Low voltage in limb leads.  -Old anterior infarct.   Exercise tolerance test 11/20/14: There was no ST segment deviation noted during stress.   Negative, adequate GXT with the patient exercising to a peak HR 139 (86% APMHR) and a workload of 8.7 mets. No chest pain or ECG changes. Duke treadmill score 8.  PCI 06/21/2013: PCI Data: Vessel - Dominant LCx/Segment - distal Percent Stenosis (pre)  100% TIMI-flow 0 Stent 2.5 x 24 mm Promus stent. Percent Stenosis (post) 0% TIMI-flow (post) 3   Final Conclusions:   1. Single vessel obstructive CAD 2. Mild to moderate LV dysfunction. 3. Successful stenting of the distal LCx with reperfusion of the PDA and PLOM branches    Recommendations: Dual antiplatelet therapy for one year.    Zannie Cove [facility]/Anesthesiology Phone [phone] 11/27/2020 9:52 AM

## 2020-11-28 ENCOUNTER — Ambulatory Visit (HOSPITAL_COMMUNITY): Payer: Medicare Other | Admitting: Physician Assistant

## 2020-11-28 ENCOUNTER — Encounter (HOSPITAL_COMMUNITY)
Admission: RE | Disposition: A | Discharge status: Home / Self Care | Payer: Self-pay | Source: Home / Self Care | Attending: Orthopedic Surgery

## 2020-11-28 ENCOUNTER — Observation Stay (HOSPITAL_COMMUNITY)
Admission: RE | Admit: 2020-11-28 | Discharge: 2020-11-29 | Disposition: A | Discharge status: Home / Self Care | Payer: Medicare Other | Source: Home / Self Care | Attending: Orthopedic Surgery | Admitting: Orthopedic Surgery

## 2020-11-28 ENCOUNTER — Ambulatory Visit (HOSPITAL_COMMUNITY): Payer: Medicare Other

## 2020-11-28 ENCOUNTER — Encounter (HOSPITAL_COMMUNITY): Payer: Self-pay | Admitting: Orthopedic Surgery

## 2020-11-28 ENCOUNTER — Other Ambulatory Visit: Payer: Self-pay

## 2020-11-28 DIAGNOSIS — M5127 Other intervertebral disc displacement, lumbosacral region: Secondary | ICD-10-CM | POA: Diagnosis not present

## 2020-11-28 DIAGNOSIS — E876 Hypokalemia: Secondary | ICD-10-CM | POA: Diagnosis not present

## 2020-11-28 DIAGNOSIS — M48061 Spinal stenosis, lumbar region without neurogenic claudication: Secondary | ICD-10-CM | POA: Diagnosis not present

## 2020-11-28 DIAGNOSIS — Z87891 Personal history of nicotine dependence: Secondary | ICD-10-CM | POA: Diagnosis not present

## 2020-11-28 DIAGNOSIS — R109 Unspecified abdominal pain: Secondary | ICD-10-CM | POA: Diagnosis not present

## 2020-11-28 DIAGNOSIS — Z743 Need for continuous supervision: Secondary | ICD-10-CM | POA: Diagnosis not present

## 2020-11-28 DIAGNOSIS — I2510 Atherosclerotic heart disease of native coronary artery without angina pectoris: Secondary | ICD-10-CM | POA: Diagnosis not present

## 2020-11-28 DIAGNOSIS — G8911 Acute pain due to trauma: Secondary | ICD-10-CM | POA: Diagnosis not present

## 2020-11-28 DIAGNOSIS — E119 Type 2 diabetes mellitus without complications: Secondary | ICD-10-CM | POA: Diagnosis not present

## 2020-11-28 DIAGNOSIS — M5126 Other intervertebral disc displacement, lumbar region: Secondary | ICD-10-CM | POA: Diagnosis not present

## 2020-11-28 DIAGNOSIS — R339 Retention of urine, unspecified: Secondary | ICD-10-CM | POA: Diagnosis not present

## 2020-11-28 DIAGNOSIS — Z79899 Other long term (current) drug therapy: Secondary | ICD-10-CM | POA: Diagnosis not present

## 2020-11-28 DIAGNOSIS — M6281 Muscle weakness (generalized): Secondary | ICD-10-CM | POA: Diagnosis not present

## 2020-11-28 DIAGNOSIS — E7800 Pure hypercholesterolemia, unspecified: Secondary | ICD-10-CM | POA: Diagnosis not present

## 2020-11-28 DIAGNOSIS — N319 Neuromuscular dysfunction of bladder, unspecified: Secondary | ICD-10-CM | POA: Diagnosis not present

## 2020-11-28 DIAGNOSIS — R6889 Other general symptoms and signs: Secondary | ICD-10-CM | POA: Diagnosis not present

## 2020-11-28 DIAGNOSIS — Z833 Family history of diabetes mellitus: Secondary | ICD-10-CM | POA: Diagnosis not present

## 2020-11-28 DIAGNOSIS — E785 Hyperlipidemia, unspecified: Secondary | ICD-10-CM | POA: Diagnosis not present

## 2020-11-28 DIAGNOSIS — D510 Vitamin B12 deficiency anemia due to intrinsic factor deficiency: Secondary | ICD-10-CM | POA: Diagnosis not present

## 2020-11-28 DIAGNOSIS — Z794 Long term (current) use of insulin: Secondary | ICD-10-CM | POA: Diagnosis not present

## 2020-11-28 DIAGNOSIS — Z23 Encounter for immunization: Secondary | ICD-10-CM | POA: Diagnosis not present

## 2020-11-28 DIAGNOSIS — Z7401 Bed confinement status: Secondary | ICD-10-CM | POA: Diagnosis not present

## 2020-11-28 DIAGNOSIS — M4800 Spinal stenosis, site unspecified: Secondary | ICD-10-CM | POA: Diagnosis not present

## 2020-11-28 DIAGNOSIS — K9189 Other postprocedural complications and disorders of digestive system: Secondary | ICD-10-CM | POA: Diagnosis not present

## 2020-11-28 DIAGNOSIS — R5381 Other malaise: Secondary | ICD-10-CM | POA: Diagnosis not present

## 2020-11-28 DIAGNOSIS — K567 Ileus, unspecified: Secondary | ICD-10-CM | POA: Diagnosis not present

## 2020-11-28 DIAGNOSIS — I255 Ischemic cardiomyopathy: Secondary | ICD-10-CM | POA: Diagnosis not present

## 2020-11-28 DIAGNOSIS — D509 Iron deficiency anemia, unspecified: Secondary | ICD-10-CM | POA: Diagnosis not present

## 2020-11-28 DIAGNOSIS — I252 Old myocardial infarction: Secondary | ICD-10-CM | POA: Insufficient documentation

## 2020-11-28 DIAGNOSIS — Z419 Encounter for procedure for purposes other than remedying health state, unspecified: Secondary | ICD-10-CM

## 2020-11-28 DIAGNOSIS — R338 Other retention of urine: Secondary | ICD-10-CM | POA: Diagnosis not present

## 2020-11-28 DIAGNOSIS — Z885 Allergy status to narcotic agent status: Secondary | ICD-10-CM | POA: Diagnosis not present

## 2020-11-28 DIAGNOSIS — Z7984 Long term (current) use of oral hypoglycemic drugs: Secondary | ICD-10-CM | POA: Diagnosis not present

## 2020-11-28 DIAGNOSIS — Z820 Family history of epilepsy and other diseases of the nervous system: Secondary | ICD-10-CM | POA: Diagnosis not present

## 2020-11-28 DIAGNOSIS — T8859XA Other complications of anesthesia, initial encounter: Secondary | ICD-10-CM

## 2020-11-28 DIAGNOSIS — R0902 Hypoxemia: Secondary | ICD-10-CM | POA: Diagnosis not present

## 2020-11-28 DIAGNOSIS — R531 Weakness: Secondary | ICD-10-CM | POA: Diagnosis not present

## 2020-11-28 DIAGNOSIS — I5022 Chronic systolic (congestive) heart failure: Secondary | ICD-10-CM | POA: Diagnosis not present

## 2020-11-28 DIAGNOSIS — E669 Obesity, unspecified: Secondary | ICD-10-CM | POA: Diagnosis not present

## 2020-11-28 DIAGNOSIS — I110 Hypertensive heart disease with heart failure: Secondary | ICD-10-CM | POA: Diagnosis not present

## 2020-11-28 DIAGNOSIS — Z20822 Contact with and (suspected) exposure to covid-19: Secondary | ICD-10-CM | POA: Diagnosis not present

## 2020-11-28 DIAGNOSIS — M48062 Spinal stenosis, lumbar region with neurogenic claudication: Secondary | ICD-10-CM | POA: Diagnosis not present

## 2020-11-28 DIAGNOSIS — R262 Difficulty in walking, not elsewhere classified: Secondary | ICD-10-CM | POA: Diagnosis not present

## 2020-11-28 DIAGNOSIS — I10 Essential (primary) hypertension: Secondary | ICD-10-CM | POA: Insufficient documentation

## 2020-11-28 DIAGNOSIS — Z9889 Other specified postprocedural states: Secondary | ICD-10-CM | POA: Diagnosis not present

## 2020-11-28 DIAGNOSIS — E1165 Type 2 diabetes mellitus with hyperglycemia: Secondary | ICD-10-CM | POA: Diagnosis not present

## 2020-11-28 DIAGNOSIS — Z4782 Encounter for orthopedic aftercare following scoliosis surgery: Secondary | ICD-10-CM | POA: Diagnosis not present

## 2020-11-28 DIAGNOSIS — M1990 Unspecified osteoarthritis, unspecified site: Secondary | ICD-10-CM | POA: Diagnosis not present

## 2020-11-28 DIAGNOSIS — D519 Vitamin B12 deficiency anemia, unspecified: Secondary | ICD-10-CM | POA: Diagnosis not present

## 2020-11-28 DIAGNOSIS — W19XXXA Unspecified fall, initial encounter: Secondary | ICD-10-CM | POA: Diagnosis not present

## 2020-11-28 DIAGNOSIS — M5136 Other intervertebral disc degeneration, lumbar region: Secondary | ICD-10-CM | POA: Diagnosis not present

## 2020-11-28 HISTORY — DX: Other complications of anesthesia, initial encounter: T88.59XA

## 2020-11-28 HISTORY — PX: LUMBAR LAMINECTOMY/DECOMPRESSION MICRODISCECTOMY: SHX5026

## 2020-11-28 LAB — GLUCOSE, CAPILLARY
Glucose-Capillary: 183 mg/dL — ABNORMAL HIGH (ref 70–99)
Glucose-Capillary: 230 mg/dL — ABNORMAL HIGH (ref 70–99)
Glucose-Capillary: 262 mg/dL — ABNORMAL HIGH (ref 70–99)
Glucose-Capillary: 293 mg/dL — ABNORMAL HIGH (ref 70–99)

## 2020-11-28 LAB — HEMOGLOBIN A1C
Hgb A1c MFr Bld: 6.9 % — ABNORMAL HIGH (ref 4.8–5.6)
Mean Plasma Glucose: 151.33 mg/dL

## 2020-11-28 SURGERY — LUMBAR LAMINECTOMY/DECOMPRESSION MICRODISCECTOMY 3 LEVELS
Anesthesia: General | Site: Back

## 2020-11-28 MED ORDER — TRANEXAMIC ACID-NACL 1000-0.7 MG/100ML-% IV SOLN
INTRAVENOUS | Status: AC
  Filled 2020-11-28: qty 100

## 2020-11-28 MED ORDER — BUPIVACAINE-EPINEPHRINE (PF) 0.25% -1:200000 IJ SOLN
INTRAMUSCULAR | Status: AC
  Filled 2020-11-28: qty 30

## 2020-11-28 MED ORDER — INSULIN GLARGINE 100 UNIT/ML ~~LOC~~ SOLN
60.0000 [IU] | Freq: Two times a day (BID) | SUBCUTANEOUS | Status: DC
  Administered 2020-11-28 – 2020-11-29 (×2): 60 [IU] via SUBCUTANEOUS
  Filled 2020-11-28 (×3): qty 0.6

## 2020-11-28 MED ORDER — ORAL CARE MOUTH RINSE: 15.0000 mL | Freq: Once | OROMUCOSAL | Status: AC

## 2020-11-28 MED ORDER — ROCURONIUM BROMIDE 10 MG/ML (PF) SYRINGE
PREFILLED_SYRINGE | INTRAVENOUS | Status: AC
  Filled 2020-11-28: qty 10

## 2020-11-28 MED ORDER — METHYLPREDNISOLONE ACETATE 40 MG/ML IJ SUSP
INTRAMUSCULAR | Status: AC
  Filled 2020-11-28: qty 1

## 2020-11-28 MED ORDER — PROPOFOL 10 MG/ML IV BOLUS
INTRAVENOUS | Status: AC
  Filled 2020-11-28: qty 20

## 2020-11-28 MED ORDER — SODIUM CHLORIDE 0.9 % IV SOLN: 250.0000 mL | INTRAVENOUS | Status: DC

## 2020-11-28 MED ORDER — ONDANSETRON HCL 4 MG PO TABS: 4.0000 mg | ORAL_TABLET | Freq: Three times a day (TID) | ORAL | 0 refills | Status: DC | PRN

## 2020-11-28 MED ORDER — AMLODIPINE BESYLATE 5 MG PO TABS
5.0000 mg | ORAL_TABLET | Freq: Every day | ORAL | Status: DC
  Administered 2020-11-29: 5 mg via ORAL
  Filled 2020-11-28: qty 1

## 2020-11-28 MED ORDER — MENTHOL 3 MG MT LOZG: 1.0000 | LOZENGE | OROMUCOSAL | Status: DC | PRN

## 2020-11-28 MED ORDER — SODIUM CHLORIDE 0.9% FLUSH
3.0000 mL | Freq: Two times a day (BID) | INTRAVENOUS | Status: DC
  Administered 2020-11-28: 3 mL via INTRAVENOUS

## 2020-11-28 MED ORDER — ROCURONIUM BROMIDE 10 MG/ML (PF) SYRINGE
PREFILLED_SYRINGE | INTRAVENOUS | Status: DC | PRN
  Administered 2020-11-28 (×2): 10 mg via INTRAVENOUS
  Administered 2020-11-28: 20 mg via INTRAVENOUS
  Administered 2020-11-28: 30 mg via INTRAVENOUS
  Administered 2020-11-28: 70 mg via INTRAVENOUS

## 2020-11-28 MED ORDER — ACETAMINOPHEN 325 MG PO TABS: 650.0000 mg | ORAL_TABLET | ORAL | Status: DC | PRN

## 2020-11-28 MED ORDER — CEFAZOLIN SODIUM-DEXTROSE 1-4 GM/50ML-% IV SOLN
1.0000 g | Freq: Three times a day (TID) | INTRAVENOUS | Status: AC
  Administered 2020-11-28 – 2020-11-29 (×2): 1 g via INTRAVENOUS
  Filled 2020-11-28 (×2): qty 50

## 2020-11-28 MED ORDER — GLIMEPIRIDE 2 MG PO TABS
4.0000 mg | ORAL_TABLET | Freq: Two times a day (BID) | ORAL | Status: DC
  Administered 2020-11-28 – 2020-11-29 (×2): 4 mg via ORAL
  Filled 2020-11-28 (×2): qty 2

## 2020-11-28 MED ORDER — ONDANSETRON HCL 4 MG/2ML IJ SOLN
INTRAMUSCULAR | Status: AC
  Filled 2020-11-28: qty 2

## 2020-11-28 MED ORDER — ACETAMINOPHEN 10 MG/ML IV SOLN
INTRAVENOUS | Status: DC | PRN
  Administered 2020-11-28: 1000 mg via INTRAVENOUS

## 2020-11-28 MED ORDER — GABAPENTIN 300 MG PO CAPS
300.0000 mg | ORAL_CAPSULE | Freq: Three times a day (TID) | ORAL | Status: DC
  Administered 2020-11-28 – 2020-11-29 (×3): 300 mg via ORAL
  Filled 2020-11-28 (×3): qty 1

## 2020-11-28 MED ORDER — HEMOSTATIC AGENTS (NO CHARGE) OPTIME
TOPICAL | Status: DC | PRN
  Administered 2020-11-28: 1 via TOPICAL

## 2020-11-28 MED ORDER — THROMBIN 20000 UNITS EX SOLR
CUTANEOUS | Status: AC
  Filled 2020-11-28: qty 20000

## 2020-11-28 MED ORDER — CEFAZOLIN SODIUM-DEXTROSE 2-4 GM/100ML-% IV SOLN
2.0000 g | INTRAVENOUS | Status: AC
  Administered 2020-11-28 (×2): 2 g via INTRAVENOUS
  Filled 2020-11-28: qty 100

## 2020-11-28 MED ORDER — ACETAMINOPHEN 650 MG RE SUPP: 650.0000 mg | RECTAL | Status: DC | PRN

## 2020-11-28 MED ORDER — OXYCODONE HCL 5 MG PO TABS
5.0000 mg | ORAL_TABLET | ORAL | Status: DC | PRN
Start: 2020-11-28 — End: 2020-11-29

## 2020-11-28 MED ORDER — METHOCARBAMOL 1000 MG/10ML IJ SOLN
500.0000 mg | Freq: Four times a day (QID) | INTRAMUSCULAR | Status: DC | PRN
  Filled 2020-11-28: qty 5

## 2020-11-28 MED ORDER — 0.9 % SODIUM CHLORIDE (POUR BTL) OPTIME
TOPICAL | Status: DC | PRN
  Administered 2020-11-28: 1000 mL

## 2020-11-28 MED ORDER — EPHEDRINE 5 MG/ML INJ
INTRAVENOUS | Status: AC
  Filled 2020-11-28: qty 10

## 2020-11-28 MED ORDER — DEXAMETHASONE SODIUM PHOSPHATE 10 MG/ML IJ SOLN
INTRAMUSCULAR | Status: AC
  Filled 2020-11-28: qty 1

## 2020-11-28 MED ORDER — LIDOCAINE 2% (20 MG/ML) 5 ML SYRINGE
INTRAMUSCULAR | Status: AC
  Filled 2020-11-28: qty 5

## 2020-11-28 MED ORDER — HYDROMORPHONE HCL 1 MG/ML IJ SOLN
0.2500 mg | INTRAMUSCULAR | Status: DC | PRN
  Administered 2020-11-28 (×4): 0.5 mg via INTRAVENOUS

## 2020-11-28 MED ORDER — LACTATED RINGERS IV SOLN: INTRAVENOUS | Status: DC

## 2020-11-28 MED ORDER — AMISULPRIDE (ANTIEMETIC) 5 MG/2ML IV SOLN
INTRAVENOUS | Status: AC
  Filled 2020-11-28: qty 4

## 2020-11-28 MED ORDER — ALBUMIN HUMAN 5 % IV SOLN: INTRAVENOUS | Status: DC | PRN

## 2020-11-28 MED ORDER — HEMOSTATIC AGENTS (NO CHARGE) OPTIME: TOPICAL | Status: DC | PRN

## 2020-11-28 MED ORDER — METHOCARBAMOL 500 MG PO TABS: 500.0000 mg | ORAL_TABLET | Freq: Three times a day (TID) | ORAL | 0 refills | Status: DC | PRN

## 2020-11-28 MED ORDER — PHENYLEPHRINE 40 MCG/ML (10ML) SYRINGE FOR IV PUSH (FOR BLOOD PRESSURE SUPPORT)
PREFILLED_SYRINGE | INTRAVENOUS | Status: DC | PRN
  Administered 2020-11-28: 80 ug via INTRAVENOUS
  Administered 2020-11-28 (×3): 40 ug via INTRAVENOUS

## 2020-11-28 MED ORDER — AMISULPRIDE (ANTIEMETIC) 5 MG/2ML IV SOLN
10.0000 mg | Freq: Once | INTRAVENOUS | Status: AC
  Administered 2020-11-28: 10 mg via INTRAVENOUS

## 2020-11-28 MED ORDER — ONDANSETRON HCL 4 MG PO TABS: 4.0000 mg | ORAL_TABLET | Freq: Four times a day (QID) | ORAL | Status: DC | PRN

## 2020-11-28 MED ORDER — MIDAZOLAM HCL 2 MG/2ML IJ SOLN
INTRAMUSCULAR | Status: AC
  Filled 2020-11-28: qty 2

## 2020-11-28 MED ORDER — LISINOPRIL 20 MG PO TABS
20.0000 mg | ORAL_TABLET | Freq: Every day | ORAL | Status: DC
  Administered 2020-11-28 – 2020-11-29 (×2): 20 mg via ORAL
  Filled 2020-11-28 (×2): qty 1

## 2020-11-28 MED ORDER — INSULIN ASPART 100 UNIT/ML IJ SOLN
0.0000 [IU] | Freq: Three times a day (TID) | INTRAMUSCULAR | Status: DC
  Administered 2020-11-28: 8 [IU] via SUBCUTANEOUS
  Administered 2020-11-29: 3 [IU] via SUBCUTANEOUS

## 2020-11-28 MED ORDER — METOPROLOL TARTRATE 25 MG PO TABS
25.0000 mg | ORAL_TABLET | Freq: Two times a day (BID) | ORAL | Status: DC
  Administered 2020-11-28 – 2020-11-29 (×2): 25 mg via ORAL
  Filled 2020-11-28 (×2): qty 1

## 2020-11-28 MED ORDER — ROSUVASTATIN CALCIUM 20 MG PO TABS
20.0000 mg | ORAL_TABLET | Freq: Every day | ORAL | Status: DC
  Administered 2020-11-29: 20 mg via ORAL
  Filled 2020-11-28: qty 1

## 2020-11-28 MED ORDER — SUGAMMADEX SODIUM 200 MG/2ML IV SOLN
INTRAVENOUS | Status: DC | PRN
  Administered 2020-11-28: 200 mg via INTRAVENOUS

## 2020-11-28 MED ORDER — POLYETHYLENE GLYCOL 3350 17 G PO PACK: 17.0000 g | PACK | Freq: Every day | ORAL | Status: DC | PRN

## 2020-11-28 MED ORDER — PHENOL 1.4 % MT LIQD: 1.0000 | OROMUCOSAL | Status: DC | PRN

## 2020-11-28 MED ORDER — CHLORHEXIDINE GLUCONATE 0.12 % MT SOLN
15.0000 mL | Freq: Once | OROMUCOSAL | Status: AC
  Administered 2020-11-28: 15 mL via OROMUCOSAL
  Filled 2020-11-28: qty 15

## 2020-11-28 MED ORDER — OXYCODONE HCL 5 MG PO TABS
10.0000 mg | ORAL_TABLET | ORAL | Status: DC | PRN
  Administered 2020-11-28 – 2020-11-29 (×5): 10 mg via ORAL
  Filled 2020-11-28 (×5): qty 2

## 2020-11-28 MED ORDER — FENTANYL CITRATE (PF) 250 MCG/5ML IJ SOLN
INTRAMUSCULAR | Status: AC
  Filled 2020-11-28: qty 5

## 2020-11-28 MED ORDER — BUPIVACAINE-EPINEPHRINE 0.25% -1:200000 IJ SOLN
INTRAMUSCULAR | Status: DC | PRN
  Administered 2020-11-28: 20 mL

## 2020-11-28 MED ORDER — OXYCODONE-ACETAMINOPHEN 10-325 MG PO TABS: 1.0000 | ORAL_TABLET | Freq: Four times a day (QID) | ORAL | 0 refills | Status: DC | PRN

## 2020-11-28 MED ORDER — TRANEXAMIC ACID-NACL 1000-0.7 MG/100ML-% IV SOLN
INTRAVENOUS | Status: DC | PRN
  Administered 2020-11-28: 1000 mg via INTRAVENOUS

## 2020-11-28 MED ORDER — SITAGLIPTIN PHOS-METFORMIN HCL 50-1000 MG PO TABS: 1.0000 | ORAL_TABLET | Freq: Two times a day (BID) | ORAL | Status: DC

## 2020-11-28 MED ORDER — MORPHINE SULFATE (PF) 2 MG/ML IV SOLN: 2.0000 mg | INTRAVENOUS | Status: DC | PRN

## 2020-11-28 MED ORDER — HYDROMORPHONE HCL 1 MG/ML IJ SOLN
INTRAMUSCULAR | Status: AC
  Filled 2020-11-28: qty 1

## 2020-11-28 MED ORDER — SODIUM CHLORIDE 0.9% FLUSH: 3.0000 mL | INTRAVENOUS | Status: DC | PRN

## 2020-11-28 MED ORDER — METHOCARBAMOL 500 MG PO TABS
500.0000 mg | ORAL_TABLET | Freq: Four times a day (QID) | ORAL | Status: DC | PRN
  Administered 2020-11-28 – 2020-11-29 (×3): 500 mg via ORAL
  Filled 2020-11-28 (×3): qty 1

## 2020-11-28 MED ORDER — LINAGLIPTIN 5 MG PO TABS
5.0000 mg | ORAL_TABLET | Freq: Every day | ORAL | Status: DC
  Administered 2020-11-29: 5 mg via ORAL
  Filled 2020-11-28: qty 1

## 2020-11-28 MED ORDER — THROMBIN 20000 UNITS EX SOLR
CUTANEOUS | Status: DC | PRN
  Administered 2020-11-28: 20 mL via TOPICAL

## 2020-11-28 MED ORDER — DEXAMETHASONE SODIUM PHOSPHATE 10 MG/ML IJ SOLN
INTRAMUSCULAR | Status: DC | PRN
  Administered 2020-11-28: 5 mg via INTRAVENOUS

## 2020-11-28 MED ORDER — PHENYLEPHRINE HCL-NACL 10-0.9 MG/250ML-% IV SOLN
INTRAVENOUS | Status: DC | PRN
  Administered 2020-11-28: 25 ug/min via INTRAVENOUS

## 2020-11-28 MED ORDER — FENTANYL CITRATE (PF) 250 MCG/5ML IJ SOLN
INTRAMUSCULAR | Status: DC | PRN
  Administered 2020-11-28 (×3): 50 ug via INTRAVENOUS
  Administered 2020-11-28: 100 ug via INTRAVENOUS

## 2020-11-28 MED ORDER — PROPOFOL 10 MG/ML IV BOLUS
INTRAVENOUS | Status: DC | PRN
  Administered 2020-11-28: 30 mg via INTRAVENOUS
  Administered 2020-11-28: 200 mg via INTRAVENOUS

## 2020-11-28 MED ORDER — MIDAZOLAM HCL 2 MG/2ML IJ SOLN
INTRAMUSCULAR | Status: DC | PRN
  Administered 2020-11-28: 2 mg via INTRAVENOUS

## 2020-11-28 MED ORDER — ONDANSETRON HCL 4 MG/2ML IJ SOLN: 4.0000 mg | Freq: Four times a day (QID) | INTRAMUSCULAR | Status: DC | PRN

## 2020-11-28 MED ORDER — METFORMIN HCL 500 MG PO TABS
1000.0000 mg | ORAL_TABLET | Freq: Two times a day (BID) | ORAL | Status: DC
  Administered 2020-11-28 – 2020-11-29 (×2): 1000 mg via ORAL
  Filled 2020-11-28 (×2): qty 2

## 2020-11-28 MED ORDER — ONDANSETRON HCL 4 MG/2ML IJ SOLN
INTRAMUSCULAR | Status: DC | PRN
  Administered 2020-11-28: 4 mg via INTRAVENOUS

## 2020-11-28 MED ORDER — INSULIN ASPART 100 UNIT/ML IJ SOLN
0.0000 [IU] | Freq: Every day | INTRAMUSCULAR | Status: DC
  Administered 2020-11-28: 3 [IU] via SUBCUTANEOUS

## 2020-11-28 MED ORDER — BUPIVACAINE LIPOSOME 1.3 % IJ SUSP
INTRAMUSCULAR | Status: AC
  Filled 2020-11-28: qty 20

## 2020-11-28 MED ORDER — LIDOCAINE 2% (20 MG/ML) 5 ML SYRINGE
INTRAMUSCULAR | Status: DC | PRN
  Administered 2020-11-28: 60 mg via INTRAVENOUS

## 2020-11-28 MED ORDER — ACETAMINOPHEN 10 MG/ML IV SOLN
INTRAVENOUS | Status: AC
  Filled 2020-11-28: qty 100

## 2020-11-28 SURGICAL SUPPLY — 67 items
ADH SKN CLS APL DERMABOND .7 (GAUZE/BANDAGES/DRESSINGS) ×1
AGENT HMST KT MTR STRL THRMB (HEMOSTASIS)
BAG COUNTER SPONGE SURGICOUNT (BAG) ×2 IMPLANT
BAG SPNG CNTER NS LX DISP (BAG) ×1
BAND INSRT 18 STRL LF DISP RB (MISCELLANEOUS)
BAND RUBBER #18 3X1/16 STRL (MISCELLANEOUS) IMPLANT
BUR EGG ELITE 4.0 (BURR) IMPLANT
BUR MATCHSTICK NEURO 3.0 LAGG (BURR) IMPLANT
CANISTER SUCT 3000ML PPV (MISCELLANEOUS) ×2 IMPLANT
CLSR STERI-STRIP ANTIMIC 1/2X4 (GAUZE/BANDAGES/DRESSINGS) ×2 IMPLANT
CORD BIPOLAR FORCEPS 12FT (ELECTRODE) ×2 IMPLANT
COVER SURGICAL LIGHT HANDLE (MISCELLANEOUS) ×2 IMPLANT
DERMABOND ADVANCED (GAUZE/BANDAGES/DRESSINGS) ×1
DERMABOND ADVANCED .7 DNX12 (GAUZE/BANDAGES/DRESSINGS) ×1 IMPLANT
DRAIN CHANNEL 15F RND FF W/TCR (WOUND CARE) ×2 IMPLANT
DRAPE MICROSCOPE LEICA 46X105 (MISCELLANEOUS) IMPLANT
DRAPE POUCH INSTRU U-SHP 10X18 (DRAPES) ×2 IMPLANT
DRAPE SURG 17X23 STRL (DRAPES) ×2 IMPLANT
DRAPE U-SHAPE 47X51 STRL (DRAPES) ×2 IMPLANT
DRSG OPSITE POSTOP 4X6 (GAUZE/BANDAGES/DRESSINGS) ×2 IMPLANT
DRSG TEGADERM 4X4.75 (GAUZE/BANDAGES/DRESSINGS) ×2 IMPLANT
DURAPREP 26ML APPLICATOR (WOUND CARE) ×2 IMPLANT
ELECT BLADE 4.0 EZ CLEAN MEGAD (MISCELLANEOUS) ×2
ELECT CAUTERY BLADE 6.4 (BLADE) ×2 IMPLANT
ELECT PENCIL ROCKER SW 15FT (MISCELLANEOUS) ×2 IMPLANT
ELECT REM PT RETURN 9FT ADLT (ELECTROSURGICAL) ×2
ELECTRODE BLDE 4.0 EZ CLN MEGD (MISCELLANEOUS) ×1 IMPLANT
ELECTRODE REM PT RTRN 9FT ADLT (ELECTROSURGICAL) ×1 IMPLANT
EVACUATOR 1/8 PVC DRAIN (DRAIN) IMPLANT
EVACUATOR SILICONE 100CC (DRAIN) ×2 IMPLANT
GAUZE SPONGE 2X2 8PLY STRL LF (GAUZE/BANDAGES/DRESSINGS) ×1 IMPLANT
GLOVE SURG ENC MOIS LTX SZ6.5 (GLOVE) ×2 IMPLANT
GLOVE SURG MICRO LTX SZ8.5 (GLOVE) ×2 IMPLANT
GLOVE SURG UNDER POLY LF SZ6.5 (GLOVE) ×2 IMPLANT
GLOVE SURG UNDER POLY LF SZ8.5 (GLOVE) ×2 IMPLANT
GOWN STRL REUS W/ TWL LRG LVL3 (GOWN DISPOSABLE) ×1 IMPLANT
GOWN STRL REUS W/TWL 2XL LVL3 (GOWN DISPOSABLE) ×4 IMPLANT
GOWN STRL REUS W/TWL LRG LVL3 (GOWN DISPOSABLE) ×2
KIT BASIN OR (CUSTOM PROCEDURE TRAY) ×2 IMPLANT
KIT TURNOVER KIT B (KITS) ×2 IMPLANT
NEEDLE 22X1 1/2 (OR ONLY) (NEEDLE) ×2 IMPLANT
NEEDLE SPNL 18GX3.5 QUINCKE PK (NEEDLE) ×4 IMPLANT
NS IRRIG 1000ML POUR BTL (IV SOLUTION) ×2 IMPLANT
PACK LAMINECTOMY ORTHO (CUSTOM PROCEDURE TRAY) ×2 IMPLANT
PACK UNIVERSAL I (CUSTOM PROCEDURE TRAY) ×2 IMPLANT
PAD ARMBOARD 7.5X6 YLW CONV (MISCELLANEOUS) ×4 IMPLANT
PATTIES SURGICAL .5 X.5 (GAUZE/BANDAGES/DRESSINGS) ×8 IMPLANT
PATTIES SURGICAL .5 X1 (DISPOSABLE) ×2 IMPLANT
SPONGE GAUZE 2X2 8PLY STRL LF (GAUZE/BANDAGES/DRESSINGS) ×2 IMPLANT
SPONGE GAUZE 2X2 STER 10/PKG (GAUZE/BANDAGES/DRESSINGS) ×1
SPONGE SURGIFOAM ABS GEL 100 (HEMOSTASIS) ×2 IMPLANT
SPONGE T-LAP 4X18 ~~LOC~~+RFID (SPONGE) ×14 IMPLANT
SURGIFLO W/THROMBIN 8M KIT (HEMOSTASIS) IMPLANT
SUT BONE WAX W31G (SUTURE) ×2 IMPLANT
SUT ETHILON 2 0 FS 18 (SUTURE) ×2 IMPLANT
SUT MNCRL AB 3-0 PS2 27 (SUTURE) ×2 IMPLANT
SUT VIC AB 0 CT1 27 (SUTURE) ×4
SUT VIC AB 0 CT1 27XBRD ANBCTR (SUTURE) ×2 IMPLANT
SUT VIC AB 1 CT1 18XCR BRD 8 (SUTURE) ×1 IMPLANT
SUT VIC AB 1 CT1 8-18 (SUTURE) ×2
SUT VIC AB 2-0 CT1 18 (SUTURE) ×4 IMPLANT
SYR BULB IRRIG 60ML STRL (SYRINGE) ×2 IMPLANT
SYR CONTROL 10ML LL (SYRINGE) ×2 IMPLANT
TOWEL GREEN STERILE (TOWEL DISPOSABLE) ×2 IMPLANT
TOWEL GREEN STERILE FF (TOWEL DISPOSABLE) ×2 IMPLANT
WATER STERILE IRR 1000ML POUR (IV SOLUTION) ×2 IMPLANT
YANKAUER SUCT BULB TIP NO VENT (SUCTIONS) IMPLANT

## 2020-11-28 NOTE — Brief Op Note (Signed)
11/28/2020  12:14 PM  PATIENT:  Tyler Garner  65 y.o. male  PRE-OPERATIVE DIAGNOSIS:  Spinal stenosis with neurogenic claudication  POST-OPERATIVE DIAGNOSIS:  * No post-op diagnosis entered *  PROCEDURE:  Procedure(s) with comments: LUMBAR DECOMPRESSION  2 LEVELS, L3 and L5 (N/A) - 3.5 hrs  SURGEON:  Surgeon(s) and Role:    Venita Lick, MD - Primary  PHYSICIAN ASSISTANT:   ASSISTANTS: Voncille Lo, PA   ANESTHESIA:   general  EBL:  100 mL   BLOOD ADMINISTERED:none  DRAINS:  1 drain in lumbar spine    LOCAL MEDICATIONS USED:  MARCAINE     SPECIMEN:  No Specimen  DISPOSITION OF SPECIMEN:  N/A  COUNTS:  YES  TOURNIQUET:  * No tourniquets in log *  DICTATION: .Dragon Dictation  PLAN OF CARE: Admit for overnight observation  PATIENT DISPOSITION:  PACU - hemodynamically stable.

## 2020-11-28 NOTE — Anesthesia Procedure Notes (Signed)
Procedure Name: Intubation Date/Time: 11/28/2020 7:31 AM Performed by: Shary Decamp, CRNA Pre-anesthesia Checklist: Patient identified, Patient being monitored, Timeout performed, Emergency Drugs available and Suction available Patient Re-evaluated:Patient Re-evaluated prior to induction Oxygen Delivery Method: Circle system utilized Preoxygenation: Pre-oxygenation with 100% oxygen Induction Type: IV induction Ventilation: Mask ventilation without difficulty Laryngoscope Size: Miller and 2 Grade View: Grade I Tube type: Oral Tube size: 8.0 mm Number of attempts: 1 Airway Equipment and Method: Stylet Placement Confirmation: ETT inserted through vocal cords under direct vision, positive ETCO2 and breath sounds checked- equal and bilateral Secured at: 22 cm Tube secured with: Tape Dental Injury: Teeth and Oropharynx as per pre-operative assessment

## 2020-11-28 NOTE — Anesthesia Postprocedure Evaluation (Signed)
Anesthesia Post Note  Patient: Tyler Garner  Procedure(s) Performed: LUMBAR DECOMPRESSION  2 LEVELS, L3 and L5 (Back)     Patient location during evaluation: PACU Anesthesia Type: General Level of consciousness: awake and alert Pain management: pain level controlled Vital Signs Assessment: post-procedure vital signs reviewed and stable Respiratory status: spontaneous breathing, nonlabored ventilation and respiratory function stable Cardiovascular status: blood pressure returned to baseline and stable Postop Assessment: no apparent nausea or vomiting Anesthetic complications: no   No notable events documented.  Last Vitals:  Vitals:   11/28/20 1335 11/28/20 1345  BP: 120/61 137/62  Pulse: 83 81  Resp: 14 18  Temp:  (!) 36.1 C  SpO2: 99% 94%    Last Pain:  Vitals:   11/28/20 1345  TempSrc:   PainSc: 6                  Chonte Ricke,W. EDMOND

## 2020-11-28 NOTE — Discharge Instructions (Addendum)
Laminectomy, Care After This sheet gives you information about how to care for yourself after your procedure. Your health care provider may also give you more specific instructions. If you have problems or questions, contact your health care provider. What can I expect after the procedure? After the procedure, it is common to have: Some pain around your incision area. Muscle tightening (spasms) across the back.   Follow these instructions at home: Incision care Follow instructions from your health care provider about how to take care of your incision area. Make sure you: Wash your hands with soap and water before and after you apply medicine to the area or change your bandage (dressing). If soap and water are not available, use hand sanitizer. Change your dressing as told by your health care provider. Leave stitches (sutures), skin glue, or adhesive strips in place. These skin closures may need to stay in place for 2 weeks or longer. If adhesive strip edges start to loosen and curl up, you may trim the loose edges. Do not remove adhesive strips completely unless your health care provider tells you to do that.  Check your incision area every day for signs of infection. Check for: More redness, swelling, or pain. More fluid or blood. Warmth. Pus or a bad smell. Medicines Take over-the-counter and prescription medicines only as told by your health care provider. If you were prescribed an antibiotic medicine, use it as told by your health care provider. Do not stop using the antibiotic even if you start to feel better. If needed, call office in 3 days to request refill of pain medications. Bathing Do not take baths, swim, or use a hot tub for 6 weeks, or until your incision has healed completely. If your health care provider approves, you may take showers after your dressing has been removed. Ok to shower in 5 days Activity Return to your normal activities as told by your health care  provider. Ask your health care provider what activities are safe for you. Avoid bending or twisting at your waist. Always bend at your knees. Do not sit for more than 20-30 minutes at a time. Lie down or walk between periods of sitting. Do not lift anything that is heavier than 10 lb (4.5 kg) or the limit that your health care provider tells you, until he or she says that it is safe. Do not drive for 2 weeks after your procedure or for as long as your health care provider tells you.  Do not drive or use heavy machinery while taking prescription pain medicine. General instructions To prevent or treat constipation while you are taking prescription pain medicine, your health care provider may recommend that you: Drink enough fluid to keep your urine clear or pale yellow. Take over-the-counter or prescription medicines. Eat foods that are high in fiber, such as fresh fruits and vegetables, whole grains, and beans. Limit foods that are high in fat and processed sugars, such as fried and sweet foods. Do breathing exercises as told. Keep all follow-up visits as told by your health care provider. This is important. Contact a health care provider if: You have more redness, swelling, or pain around your incision area. Your incision feels warm to the touch. You are not able to return to activities or do exercises as told by your health care provider. Get help right away if: You have: More fluid or blood coming from your incision area. Pus or a bad smell coming from your incision area. Chills or a fever.   Episodes of dizziness or fainting while standing. You develop a rash. You develop shortness of breath or you have difficulty breathing. You cannot control when you urinate or have a bowel movement. You become weak. You are not able to use your legs. Summary After the procedure, it is common to have some pain around your incision area. You may also have muscle tightening (spasms) across the  back. Follow instructions from your health care provider about how to care for your incision. Do not lift anything that is heavier than 10 lb (4.5 kg) or the limit that your health care provider tells you, until he or she says that it is safe. Contact your health care provider if you have more redness, swelling, or pain around your incision area or if your incision feels warm to the touch. These can be signs of infection. This information is not intended to replace advice given to you by your health care provider. Make sure you discuss any questions you have with your health care provider. Refer to this sheet in the next few weeks. These instructions provide you with information about caring for yourself after your procedure. Your health care provider may also give you more specific instructions. Your treatment has been planned according to current medical practices, but problems sometimes occur. Call your health care provider if you have any problems or questions after your procedure. What can I expect after the procedure? It is common to have pain for the first few days after the procedure. Some people continue to have mild pain even after making a full recovery. Follow these instructions at home: Medicine Take medicines only as directed by your health care provider. Avoid taking over-the-counter pain medicines unless your health care provider tells you otherwise. These medicines interfere with the development and growth of new bone cells. If you were prescribed a narcotic pain medicine, take it exactly as told by your health care provider. Do not drink alcohol while on the medicine. Do not drive while on the medicine. Injury care Care for your back brace as told by your health care provider. If directed, apply ice to the injured area: Put ice in a plastic bag. Place a towel between your skin and the bag. Leave the ice on for 20 minutes, 2-3 times a day. Activity Perform physical therapy  exercises as told by your health care provider. Exercise regularly. Start by taking short walks. Slowly increase your activity level over time. Gentle exercise helps to ease pain. Sit, stand, walk, turn in bed, and reposition yourself as told by your health care provider. This will help to keep your spine in proper alignment. Avoid bending and twisting your body. Avoid doing strenuous household chores, such as vacuuming. Do not lift anything that is heavier than 10 lb (4.5 kg). Other Instructions Keep all follow-up visits as directed by your health care provider. This is important. Do not use any tobacco products, including cigarettes, chewing tobacco, or electronic cigarettes. If you need help quitting, ask your health care provider. Nicotine affects the way bones heal. Contact a health care provider if: Your pain gets worse. You have a fever. You have redness, swelling, or pain at the site of your incision. You have fluid, blood, or pus coming from your incision. You have numbness, tingling, or weakness in any part of your body. Get help right away if: Your incision feels swollen and tender, and the surrounding area looks like a lump. The lump may be red or bluish in color. You   cannot move any part of your body (paralysis). You cannot control your bladder or bowels.   RESTART ASA (ASPIRIN) ON Sunday 12/01/20

## 2020-11-28 NOTE — Op Note (Signed)
OPERATIVE REPORT  DATE OF SURGERY: 11/28/2020  PATIENT NAME:  Tyler Garner MRN: [id number] DOB: September 17, 1955  PCP: Bennie Pierini, [name]  PRE-OPERATIVE DIAGNOSIS: Lumbar spinal stenosis with neurogenic claudication  POST-OPERATIVE DIAGNOSIS: Same  PROCEDURE:   L3-5 lumbar decompression.  L4-5 left discectomy  SURGEON:  Venita Lick, MD  PHYSICIAN ASSISTANT: Voncille Lo, PA  ANESTHESIA:   General  EBL: 100 ml   Operative findings: Severe spinal stenosis as expected L3-L5.  Upon review of the MRI and clinical symptoms I did not feel the right-sided small disc protrusion at L2-3 was significant to warrant further decompression and the potential for creating instability.  Due to need for a wide bony decompression L3-L5 to adequately decompress the stenosis I did not feel as though proceeding up to the L2-3 level was necessary.  BRIEF HISTORY: Tyler Garner is a 65 y.o. male who presents to my office with complaints of severe back buttock and bilateral neuropathic leg pain right side worse than the left.  Imaging studies demonstrated severe spinal stenosis L3-5 with mild disc protrusion and mild lateral recess stenosis on the left side at L2-3.  Upon evaluation I informed the patient that I think his primary area is the spinal stenosis L3-5.  I indicated that we would consider doing the L2-3 level as well if I thought it was necessary.  I did review the risks benefits and alternatives to surgery with the patient in great detail and all of his questions were encouraged and addressed.  PROCEDURE DETAILS: Patient was brought into the operating room. After successful induction of general anesthesia and endotracheal intubation a Time Out was done. This confirmed all pertinent important data.  Teds, SCDs, and a Foley were inserted and the patient was turned prone onto a Wilson frame.  All bony prominences well-padded, and the back was prepped and draped in a standard fashion.  2 needles  were then placed into the back and an x-ray was taken for localization of the incision site.  I marked out from the inferior aspect the L2 to the inferior aspect of the L 5 level.  I infiltrated the skin with quarter percent Marcaine with epinephrine I made my midline incision.  Sharp dissection was carried out down to the deep fascia I incised the deep fracture and expose the L2-5 spinous process and lamina.  I then took my decompression out to the lateral aspect of the facet complex at each level.  Care was taken not to violate the facet capsule.  Self-retaining retractors were placed and a Penfield 4 was placed into the L4 lamina.  A second x-ray was taken and read by the radiologist confirming I was at the L4-5 level.  Leksell rongeur was used to remove the entire L4 spinous process.  Significant central stenosis and thickening of the ligamentum flavum.  A lamina spreader was used to aid in the initial decompression at the L4 level.  I gently dissected through the thickened ligamentum flavum till is able to create a plane between the ligamentum flavum and the lamina.  Using a 3 mm Kerrison rongeur I performed a central laminotomy of L4.  This allowed me to further dissect through the ligamentum flavum until I could create a plane between the thecal sac and the ligamentum flavum.  There was a severe amount of central stenosis due to the thickening of the ligamentum flavum.  This was more pronounced than would be expected based on the preoperative MRI.  Using the Ramos 4 and  Woodson Engineer, structural I ultimately was able to dissect through the central raphae of the ligamentum flavum and create a plane between the thecal sac and the ligamentum flavum.  Using a Kerrison rongeur I remove the central portion of the ligamentum flavum.  I then dissected inferiorly removing the superior portion of the L5 lamina to further decompress the canal.  I then proceeded into the lateral recess where I noted marked lateral recess  stenosis due to significant medial osteophyte formation on the facet.  Using a Penfield 4 I created a plane between the thecal sac/nerve root and the osteophyte.  Using a Kerrison rongeur I resected the medial portion of the facet in order to adequately decompress the lateral recess.  I then proceeded into the foramen of L5 with the foraminal Kerrison rongeurs.  The epidural veins were isolated and coagulated with bipolar cautery.  With the right L4-5 lateral recess decompressed I went to the contralateral side in order to work on the left side.  Using the same technique I gently dissected into the lateral recess and resected the medial osteophyte from the facet complex and completely decompressed the spine.  I made sure I could visualize the medial border of the pedicle of L5 confirming an adequate central decompression.  I also was able to track the L5 nerve roots into the lateral recess.  At the L4-5 level on the left side I did note a disc herniation consistent with what was seen on the preoperative MRI.  At this point I remove the inferior third of the spinous process of L 3 to perform a central decompression at this level.  Using the same technique I used the L4-5 I was ultimately able to perform a central laminotomy of L3.  Again I noted severe stenosis centrally with significant large bony overgrowth causing marked spinal stenosis.  Gentle decompression with the Penfield 4 allowed me to create the plane between the ligamentum flavum and the lamina.  Once I had exposed the dorsal surface of the thecal sac I was able to continue my decompression caudal direction.  I placed neuro patties underneath the remaining portion of the L4 lamina so that I could protect the thecal sac from inadvertent injury.  The L4 central laminectomy was completed.  I then proceeded superiorly progressing the L3 anatomy until I was able to palpate the L3 pedicle.  I was also able to visualize the L3-4 disc space and I confirmed that  my decompression was cranial to the disc space.  This spanned the area of maximum stenosis seen on the preoperative MRI.  Once again I began decompressing the lateral recess and there was marked stenosis from medial osteophytes from the facet complex.  This was all decompressed and taken down with Kerrison rongeurs.  Foraminotomies of L3 and L4 were also carried out with foraminal Kerrisons.  Once the decompression was complete I was able to easily palpate from the L3 pedicle down to the L5 pedicle bilaterally.  I then isolated the L4-5 left disc herniation in order to resect it.  An annulotomy was performed and I swept underneath the annulus with a Kerrison rongeur to deliver 2 small fragments of disc material.  I then used the Epstein curettes to debride the osteophyte.  I also debrided some of the residual thickened annulus.  At this point time I took a third x-ray confirming that my decompression spanned from the L3 pedicle down to the L5 pedicle.  At this point I did not think additional  surgery to decompress the small disc protrusion at L2/3 on the left side was warranted.  The patient had predominant right leg pain, and the stenosis was quite severe.  The L3 nerve root was decompressed in the foramen and in the lateral recess and so was able to get above the disc space at L3-4.  At this point satisfied with the decompression irrigated copiously normal saline and used bipolar electrocautery and Floseal to obtain hemostasis.  Once I felt hemostasis was achieved I irrigated 1 last time.  I did bring a drain out through a separate stab incision.  I closed the deep fascia with interrupted #1 Vicryl sutures, then a running 0 Vicryl suture, then 2-0 interrupted Vicryl sutures for the deep subcutaneous tissue and a 3-0 Monocryl for the skin.  Steri-Strips and a dry dressing were applied and the patient was transferred without incident the end of the case all needle and sponge counts were correct.  There were no  adverse intraoperative events.  Venita Lick, MD 11/28/2020 11:54 AM

## 2020-11-28 NOTE — Transfer of Care (Signed)
Immediate Anesthesia Transfer of Care Note  Patient: Tyler Garner  Procedure(s) Performed: LUMBAR DECOMPRESSION  2 LEVELS, L3 and L5 (Back)  Patient Location: PACU  Anesthesia Type:General  Level of Consciousness: awake, patient cooperative and responds to stimulation  Airway & Oxygen Therapy: Patient Spontanous Breathing and Patient connected to face mask oxygen  Post-op Assessment: Report given to RN, Post -op Vital signs reviewed and stable and Patient moving all extremities X 4  Post vital signs: Reviewed and stable  Last Vitals:  Vitals Value Taken Time  BP 133/90 11/28/20 1305  Temp 36.1 C 11/28/20 1305  Pulse 86 11/28/20 1308  Resp 18 11/28/20 1308  SpO2 99 % 11/28/20 1308  Vitals shown include unvalidated device data.  Last Pain:  Vitals:   11/28/20 0621  TempSrc:   PainSc: 10-Worst pain ever         Complications: No notable events documented.

## 2020-11-28 NOTE — H&P (Signed)
Addendum H&P: Tyler Garner is a very pleasant 65 year old gentleman who was severe lumbar spinal stenosis with neurogenic claudication L2-5.  Attempts at conservative management had failed to alleviate his symptoms and improve his quality of life.  As result he is elected to move forward with surgery.  I have gone over the surgical procedure including the risks, benefits, and alternatives and all of his questions and concerns were addressed.  The patient has expressed an understanding of the surgery as well as the risks and a willingness to move forward with surgery.  There has been no change in his clinical exam since his last office visit of 11/19/2020.  Plan on lumbar decompression L2-5 for spinal stenosis with neurogenic claudication

## 2020-11-29 ENCOUNTER — Encounter (HOSPITAL_COMMUNITY): Payer: Self-pay | Admitting: Orthopedic Surgery

## 2020-11-29 LAB — GLUCOSE, CAPILLARY: Glucose-Capillary: 187 mg/dL — ABNORMAL HIGH (ref 70–99)

## 2020-11-29 NOTE — Discharge Summary (Signed)
Patient ID: Tyler Garner MRN: [id number] DOB/AGE: 66-Feb-1957 66 y.o.  Admit date: 11/28/2020 Discharge date: 11/29/2020  Admission Diagnoses:  Active Problems:   Spinal stenosis   Discharge Diagnoses:  Active Problems:   Spinal stenosis  status post Procedure(s): LUMBAR DECOMPRESSION  2 LEVELS, L3 and L5  Past Medical History:  Diagnosis Date   Arthritis    CAD (coronary artery disease)    a. inferior STEMI (05/2013) with RV involvement- LHC:  dist CFX occluded (Promus premier (2.5x24 mm) DES); EF 40-45% with inf HK;  b. Echo (report pending) with EF 45-50%, lat WMA, RVE, mod RV hypokinesis    Diabetes mellitus without complication (HCC)    Hyperglycemia    Hyperlipidemia    Hypertension    Ischemic cardiomyopathy    Myocardial infarction (HCC) 2015   Nicotine addiction    Renal colic    Right shoulder injury 10/11/2009    Surgeries: Procedure(s): LUMBAR DECOMPRESSION  2 LEVELS, L3 and L5 on 11/28/2020   Consultants:   Discharged Condition: Improved  Hospital Course: Tyler Garner is an 65 y.o. male who was admitted 11/28/2020 for operative treatment of lumbar spinal stenosis with neurogenic claudication. Patient failed conservative treatments (please see the history and physical for the specifics) and had severe unremitting pain that affects sleep, daily activities and work/hobbies. After pre-op clearance, the patient was taken to the operating room on 11/28/2020 and underwent  Procedure(s): LUMBAR DECOMPRESSION  2 LEVELS, L3 and L5.    Patient was given perioperative antibiotics:  Anti-infectives (From admission, onward)    Start     Dose/Rate Route Frequency Ordered Stop   11/28/20 2000  ceFAZolin (ANCEF) IVPB 1 g/50 mL premix        1 g 100 mL/hr over 30 Minutes Intravenous Every 8 hours 11/28/20 1447 11/29/20 1026   11/28/20 0559  ceFAZolin (ANCEF) IVPB 2g/100 mL premix        2 g 200 mL/hr over 30 Minutes Intravenous 30 min pre-op 11/28/20 0559 11/28/20 1125         Patient was given sequential compression devices and early ambulation to prevent DVT.   Patient benefited maximally from hospital stay and there were no complications. At the time of discharge, the patient was urinating/moving their bowels without difficulty, tolerating a regular diet, pain is controlled with oral pain medications and they have been cleared by PT/OT.   Recent vital signs: Patient Vitals for the past 24 hrs:  BP Temp Temp src Pulse Resp SpO2  11/29/20 0726 134/68 97.6 F (36.4 C) Oral 71 18 99 %  11/29/20 0341 (!) 123/51 97.8 F (36.6 C) Oral 67 20 98 %  11/28/20 2319 (!) 126/59 98.1 F (36.7 C) Oral 72 20 96 %  11/28/20 1926 (!) 126/59 98.2 F (36.8 C) Oral 76 20 96 %     Recent laboratory studies: No results for input(s): WBC, HGB, HCT, PLT, NA, K, CL, CO2, BUN, CREATININE, GLUCOSE, INR, CALCIUM in the last 72 hours.  Invalid input(s): PT, 2   Discharge Medications:   Allergies as of 11/29/2020       Reactions   Percocet [oxycodone-acetaminophen] Nausea And Vomiting        Medication List     STOP taking these medications    aspirin 81 MG tablet   GOODY HEADACHE PO       TAKE these medications    acetaminophen 650 MG CR tablet Commonly known as: TYLENOL Take 650 mg by mouth every  8 (eight) hours as needed for pain.   amLODipine 5 MG tablet Commonly known as: NORVASC Take 1 tablet (5 mg total) by mouth daily.   glimepiride 4 MG tablet Commonly known as: AMARYL Take 1 tablet (4 mg total) by mouth 2 (two) times daily.   glucose blood test strip Commonly known as: ACCU-CHEK ACTIVE STRIPS Check blood sugar daily and prn  Dx E11.9   Janumet 50-1000 MG tablet Generic drug: sitaGLIPtin-metformin Take 1 tablet by mouth 2 (two) times daily.   Lantus SoloStar 100 UNIT/ML Solostar Pen Generic drug: insulin glargine Inject 60 Units into the skin 2 (two) times daily.   lisinopril 20 MG tablet Commonly known as: ZESTRIL Take 1 tablet  (20 mg total) by mouth daily.   methocarbamol 500 MG tablet Commonly known as: Robaxin Take 1 tablet (500 mg total) by mouth every 8 (eight) hours as needed for up to 5 days for muscle spasms.   metoprolol tartrate 25 MG tablet Commonly known as: LOPRESSOR Take 1 tablet (25 mg total) by mouth 2 (two) times daily.   ondansetron 4 MG tablet Commonly known as: Zofran Take 1 tablet (4 mg total) by mouth every 8 (eight) hours as needed for nausea or vomiting.   oxyCODONE-acetaminophen 10-325 MG tablet Commonly known as: Percocet Take 1 tablet by mouth every 6 (six) hours as needed for up to 5 days for pain.   Pen Needles 31G X 8 MM Misc Use with lantus solosar pen daily   rosuvastatin 40 MG tablet Commonly known as: Crestor Take 0.5 tablets (20 mg total) by mouth daily.        Diagnostic Studies: DG Chest 1 View  Result Date: 11/18/2020 CLINICAL DATA:  Pre-op clearance exam EXAM: CHEST  1 VIEW COMPARISON:  06/21/2013 FINDINGS: The heart size and mediastinal contours are within normal limits. Both lungs are clear. The visualized skeletal structures are unremarkable. IMPRESSION: No active disease. Electronically Signed   By: Danae Orleans M.D.   On: 11/18/2020 10:11   DG Lumbar Spine 2-3 Views  Result Date: 11/28/2020 CLINICAL DATA:  Lumbar decompression. EXAM: LUMBAR SPINE - 2-3 VIEW COMPARISON:  10/27/2019 FINDINGS: Two lateral intraoperative cross-table radiographs of the lumbar spine are provided. The comparison lumbar spine radiographs demonstrate transitional lumbosacral anatomy. The transitional segment is considered a partially sacralized L5 with hypoplastic L5-S1 disc (with this numbering convention confirmed with Dr. Shon Baton). On the first image, 2 needles are in place with 1 projecting between the L2 and L3 spinous processes and the other directed towards the S1 level. On the second image, the tip of a metallic instrument projects over the posterior elements of the lower lumbar  spine directed towards the L5 superior endplate/inferior aspect of the L4-5 disc space. IMPRESSION: Intraoperative images as above. These results were called by telephone at the time of interpretation on 11/28/2020 at 8:48 am to Dr. Venita Lick, who verbally acknowledged these results. Electronically Signed   By: Sebastian Ache M.D.   On: 11/28/2020 08:51   DG Lumbar Spine 1 View  Result Date: 11/28/2020 CLINICAL DATA:  Intraop localization. EXAM: LUMBAR SPINE - 1 VIEW COMPARISON:  Immediately preceding radiographs. Lumbar radiograph 10/27/2019 also reviewed. FINDINGS: Single portable cross-table lateral view of the lumbar spine obtained in the operating room. Same numbering technique as on prior. Surgical instruments localize posterior to the L5 vertebral body and posterior to L3 vertebral body overlying the pedicle. IMPRESSION: Surgical localization with instruments posterior to the L5 vertebral body and L3 vertebral  body/pedicle. Electronically Signed   By: Narda Rutherford M.D.   On: 11/28/2020 15:19    Discharge Instructions     Incentive spirometry RT   Complete by: As directed         Follow-up Information     Venita Lick, MD. Schedule an appointment as soon as possible for a visit in 2 week(s).   Specialty: Orthopedic Surgery Why: If symptoms worsen, For suture removal, For wound re-check Contact information: [address] [place] [address] [phone]                 Discharge Plan:  discharge to home  Disposition: stable    Signed: Rhodia Albright  [facility] [phone] 11/29/2020, 5:25 PM

## 2020-11-29 NOTE — Evaluation (Signed)
Physical Therapy Evaluation Patient Details Name: Tyler Garner MRN: [id number] DOB: Aug 22, 1955 Today's Date: 11/29/2020   History of Present Illness  65 year old gentleman presented with severe lumbar spinal stenosis with neurogenic claudication L2-5.  PMH includes:Morbid obesity, Arthritis, Primary hypertension, Uncontrolled diabetes mellitus.  S/p LUMBAR DECOMPRESSION  2 LEVELS, L3 and L5.  Clinical Impression  PTA pt living alone in single story home with 1 step to enter. Pt reports ambulation with RW and independence in ADLs, and driving. Pt is currently limited in safe mobility by appropriate pain at surgical site, in presence of generalized weakness and decreased balance. Pt is supervision for transfers and ambulation with RW, min A for ascent/descent of one step. Pt will be discharging to son's home where he will have 24 hr assist.     Follow Up Recommendations No PT follow up;Supervision/Assistance - 24 hour    Equipment Recommendations  Rolling walker with 5" wheels;3in1 (PT)       Precautions / Restrictions Precautions Precautions: Back Precaution Booklet Issued: Yes (comment) Precaution Comments: Reviewed, able to recall 2/3 precautions Required Braces or Orthoses: Spinal Brace Spinal Brace: Lumbar corset Restrictions Weight Bearing Restrictions: No      Mobility  Bed Mobility Overal bed mobility: Needs Assistance Bed Mobility: Sidelying to Sit;Sit to Sidelying   Sidelying to sit: Min assist     Sit to sidelying: Min assist General bed mobility comments: sitting EoB on entry    Transfers Overall transfer level: Needs assistance Equipment used: Rolling walker (2 wheeled) Transfers: Sit to/from [employer] Sit to Stand: Supervision Stand pivot transfers: Supervision          Ambulation/Gait Ambulation/Gait assistance: Supervision Gait Distance (Feet): 40 Feet Assistive device: Rolling walker (2 wheeled) Gait Pattern/deviations:  Step-through pattern;Decreased step length - right;Decreased step length - left;Shuffle;Decreased dorsiflexion - right;Decreased weight shift to right;Trunk flexed Gait velocity: slowed Gait velocity interpretation: <1.31 ft/sec, indicative of household ambulator General Gait Details: supervision for safety,pt has increased forward flexion with gait, however pt and son both endorse improved posture since surgery, pt with hx foot drop on R, vc for increased hip and knee flexion to keep toes from dragging  Stairs Stairs: Yes Stairs assistance: Min assist Stair Management: One rail Left;Forwards;Backwards;Step to pattern Number of Stairs: 1 General stair comments: min A for steadying with power up and back, vc for sequencing LLE up and RLE down        Balance Overall balance assessment: Mild deficits observed, not formally tested;History of Falls                                           Pertinent Vitals/Pain Pain Assessment: 0-10 Pain Score: 10-Worst pain ever Faces Pain Scale: Hurts even more Pain Location: incision site Pain Descriptors / Indicators: Aching;Tender;Operative site guarding;Grimacing;Guarding Pain Intervention(s): Limited activity within patient's tolerance;Monitored during session;Repositioned;Patient requesting pain meds-RN notified    Home Living Family/patient expects to be discharged to:: Private residence Living Arrangements: Alone Available Help at Discharge: Family;Available PRN/intermittently Type of Home: House Home Access: Stairs to enter Entrance Stairs-Rails: None Entrance Stairs-Number of Steps: 1 Home Layout: One level Home Equipment: Walker - 2 wheels Additional Comments: Information above is his home, he will be staying with his son for maybe 1 week.  1 STE, multi-level, but able to stay on the main level.    Prior Function Level of Independence: Independent  with assistive device(s)         Comments: Has a second hand RW  that he uses at times.  Was able to complete ADL/IADL, and continues to drive.     Hand Dominance   Dominant Hand: Right    Extremity/Trunk Assessment   Upper Extremity Assessment Upper Extremity Assessment: Overall WFL for tasks assessed    Lower Extremity Assessment Lower Extremity Assessment: RLE deficits/detail RLE Deficits / Details: decreased dorsiflexion and 2/5 strength RLE Coordination: decreased gross motor    Cervical / Trunk Assessment Cervical / Trunk Assessment: Other exceptions;Kyphotic Cervical / Trunk Exceptions: spine surgery  Communication   Communication: No difficulties  Cognition Arousal/Alertness: Awake/alert Behavior During Therapy: WFL for tasks assessed/performed Overall Cognitive Status: Within Functional Limits for tasks assessed                                        General Comments General comments (skin integrity, edema, etc.): son present during session and verbalizes understanding of pt care and assist        Assessment/Plan    PT Assessment Patent does not need any further PT services         PT Goals (Current goals can be found in the Care Plan section)  Acute Rehab PT Goals Patient Stated Goal: wants to get back to hunting PT Goal Formulation: With patient/family               AM-PAC PT "6 Clicks" Mobility  Outcome Measure Help needed turning from your back to your side while in a flat bed without using bedrails?: None Help needed moving from lying on your back to sitting on the side of a flat bed without using bedrails?: None Help needed moving to and from a bed to a chair (including a wheelchair)?: None Help needed standing up from a chair using your arms (e.g., wheelchair or bedside chair)?: None Help needed to walk in hospital room?: None Help needed climbing 3-5 steps with a railing? : A Little 6 Click Score: 23    End of Session Equipment Utilized During Treatment: Back brace Activity Tolerance:  Patient limited by pain Patient left: in bed;with family/visitor present Nurse Communication: Mobility status;Patient requests pain meds PT Visit Diagnosis: Unsteadiness on feet (R26.81);Other abnormalities of gait and mobility (R26.89);Muscle weakness (generalized) (M62.81);Difficulty in walking, not elsewhere classified (R26.2);Pain Pain - part of body:  (back)    Time: 2778-2423 PT Time Calculation (min) (ACUTE ONLY): 20 min   Charges:   PT Evaluation $PT Eval Low Complexity: 1 Low          Ardel Jagger B. Beverely Risen PT, DPT Acute Rehabilitation Services Pager [phone] Office [phone]   Elon Alas Fleet 11/29/2020, 10:36 AM

## 2020-11-29 NOTE — Evaluation (Signed)
Occupational Therapy Evaluation Patient Details Name: NIKIA MANGINO MRN: [id number] DOB: 11/07/1955 Today's Date: 11/29/2020    History of Present Illness 65 year old gentleman presented with severe lumbar spinal stenosis with neurogenic claudication L2-5.  PMH includes:Morbid obesity, Arthritis, Primary hypertension, Uncontrolled diabetes mellitus.  S/p LUMBAR DECOMPRESSION  2 LEVELS, L3 and L5.   Clinical Impression   Patient admitted for the above diagnosis and procedure.  PTA he lived alone, son can assist as needed, but initially he will go to the son's home for up to a week.  He is close to his baseline, he some safety deficits, and a history of falls, but should do well if he follows his precautions.  Barriers are listed below.  He plans on returning home, no further OT needs in the acute setting.      Follow Up Recommendations  No OT follow up    Equipment Recommendations  3 in 1 bedside commode;Other (comment) (2WRW)    Recommendations for Other Services       Precautions / Restrictions Precautions Precautions: Back Precaution Booklet Issued: Yes (comment) Precaution Comments: Reviewed Required Braces or Orthoses: Spinal Brace Spinal Brace: Lumbar corset Restrictions Weight Bearing Restrictions: No      Mobility Bed Mobility Overal bed mobility: Needs Assistance Bed Mobility: Sidelying to Sit;Sit to Sidelying   Sidelying to sit: Min assist     Sit to sidelying: Min assist General bed mobility comments: assist with elevating trunk and placing feet back on the bed Patient Response: Cooperative  Transfers Overall transfer level: Needs assistance Equipment used: Rolling walker (2 wheeled) Transfers: Sit to/from [employer] Sit to Stand: Supervision Stand pivot transfers: Supervision            Balance Overall balance assessment: Mild deficits observed, not formally tested                                         ADL  either performed or assessed with clinical judgement   ADL Overall ADL's : Modified independent                                       General ADL Comments: Able to complete ADL with use of hip kit from sit/supine level.     Vision Patient Visual Report: No change from baseline       Perception     Praxis      Pertinent Vitals/Pain Pain Assessment: Faces Faces Pain Scale: Hurts even more Pain Location: incision site Pain Descriptors / Indicators: Aching;Tender;Operative site guarding;Grimacing;Guarding Pain Intervention(s): Monitored during session     Hand Dominance Right   Extremity/Trunk Assessment Upper Extremity Assessment Upper Extremity Assessment: Overall WFL for tasks assessed   Lower Extremity Assessment Lower Extremity Assessment: Defer to PT evaluation   Cervical / Trunk Assessment Cervical / Trunk Assessment: Other exceptions;Kyphotic Cervical / Trunk Exceptions: spine surgery   Communication Communication Communication: No difficulties   Cognition Arousal/Alertness: Awake/alert Behavior During Therapy: WFL for tasks assessed/performed Overall Cognitive Status: Within Functional Limits for tasks assessed  Home Living Family/patient expects to be discharged to:: Private residence Living Arrangements: Alone Available Help at Discharge: Family;Available PRN/intermittently Type of Home: House Home Access: Stairs to enter [employer] of Steps: 1 Entrance Stairs-Rails: None Home Layout: One level     Bathroom Shower/Tub: Tub/shower unit;Walk-in shower   Bathroom Toilet: Standard     Home Equipment: Walker - 2 wheels   Additional Comments: Information above is his home, he will be staying with his son for maybe 1 week.  1 STE, multi-level, but able to stay on the main level.      Prior Functioning/Environment Level of Independence: Independent  with assistive device(s)        Comments: Has a second hand RW that he uses at times.  Was able to complete ADL/IADL, and continues to drive.        OT Problem List: Decreased strength;Impaired balance (sitting and/or standing);Pain;Decreased safety awareness      OT Treatment/Interventions:      OT Goals(Current goals can be found in the care plan section) Acute Rehab OT Goals Patient Stated Goal: Patient ready to return home OT Goal Formulation: With patient Time For Goal Achievement: 11/29/20 Potential to Achieve Goals: Good  OT Frequency:     Barriers to D/C:  None noted          Co-evaluation              AM-PAC OT "6 Clicks" Daily Activity     Outcome Measure Help from another person eating meals?: None Help from another person taking care of personal grooming?: None Help from another person toileting, which includes using toliet, bedpan, or urinal?: None Help from another person bathing (including washing, rinsing, drying)?: None Help from another person to put on and taking off regular upper body clothing?: None Help from another person to put on and taking off regular lower body clothing?: None 6 Click Score: 24   End of Session Equipment Utilized During Treatment: Rolling walker;Back brace Nurse Communication: Mobility status  Activity Tolerance: Patient tolerated treatment well Patient left: in bed;with call bell/phone within reach;with family/visitor present  OT Visit Diagnosis: Unsteadiness on feet (R26.81);Pain;History of falling (Z91.81);Muscle weakness (generalized) (M62.81) Pain - Right/Left:  (spine)                Time: 1610-9604 OT Time Calculation (min): 27 min Charges:  OT General Charges $OT Visit: 1 Visit OT Evaluation $OT Eval Moderate Complexity: 1 Mod OT Treatments $Self Care/Home Management : 8-22 mins  11/29/2020  Rich, OTR/L  Acute Rehabilitation Services  Office:  [phone]   Metta Clines 11/29/2020, 9:59 AM

## 2020-11-29 NOTE — Progress Notes (Signed)
Patient is discharged from room 3C07 at this time. Alert and in stable condition. IV site d/c'd and instructions read to patient and son with understanding verbalized and all questions answered. Left unit via wheelchair with all belongings at side.

## 2020-11-29 NOTE — Progress Notes (Signed)
    Subjective: Procedure(s) (LRB): LUMBAR DECOMPRESSION  2 LEVELS, L3 and L5 (N/A) 1 Day Post-Op  Patient reports pain as 2 on 0-10 scale.  Reports decreased leg pain reports incisional back pain   Positive void Negative bowel movement Positive flatus Negative chest pain or shortness of breath  Objective: Vital signs in last 24 hours: Temp:  [97 F (36.1 C)-98.2 F (36.8 C)] 97.6 F (36.4 C) (07/08 0726) Pulse Rate:  [67-86] 71 (07/08 0726) Resp:  [10-20] 18 (07/08 0726) BP: (120-148)/(51-90) 134/68 (07/08 0726) SpO2:  [94 %-100 %] 99 % (07/08 0726)  Intake/Output from previous day: 07/07 0701 - 07/08 0700 In: 2100 [I.V.:1200; IV Piggyback:900] Out: 1470 [Urine:1100; Drains:270; Blood:100]  Labs: Recent Labs    11/26/20 0921  WBC 6.8  RBC 4.36  HCT 39.7  PLT 272   Recent Labs    11/26/20 0921  NA 141  K 4.0  CL 106  CO2 26  BUN 12  CREATININE 0.68  GLUCOSE 89  CALCIUM 9.2   Recent Labs    11/26/20 0921  INR 1.0    Physical Exam: Neurologically intact ABD soft Intact pulses distally Incision: dressing C/D/I and minimal drain output noted Compartment soft Body mass index is 34.11 kg/m.   Assessment/Plan: Patient stable  xrays n/a Continue mobilization with physical therapy Continue care  Patient is doing exceptionally well status post lumbar decompression for spinal stenosis with neurogenic claudication. Minimal drain output is noted.  We will remove the drain this morning. Continue to work with physical therapy this morning and plan on discharge to home late morning/early afternoon. I have asked patient to focus his posture.  He states for 3 years he is remained in a forward flexed position because of leg pain.  He now cannot stand in a neutral position and does not develop neurogenic claudication pain.  He will follow-up with me in 2 weeks as scheduled.  Prescriptions and instructions have been provided.  Venita Lick, MD [facility] [phone]

## 2020-11-30 ENCOUNTER — Emergency Department (HOSPITAL_COMMUNITY): Payer: Medicare Other

## 2020-11-30 ENCOUNTER — Other Ambulatory Visit: Payer: Self-pay

## 2020-11-30 ENCOUNTER — Inpatient Hospital Stay (HOSPITAL_COMMUNITY)
Admission: EM | Admit: 2020-11-30 | Discharge: 2020-12-04 | DRG: 982 | Disposition: A | Discharge status: Skilled Nursing Facility | Payer: Medicare Other | Attending: Student | Admitting: Student

## 2020-11-30 ENCOUNTER — Encounter (HOSPITAL_COMMUNITY): Payer: Self-pay

## 2020-11-30 DIAGNOSIS — Z7984 Long term (current) use of oral hypoglycemic drugs: Secondary | ICD-10-CM | POA: Diagnosis not present

## 2020-11-30 DIAGNOSIS — Z833 Family history of diabetes mellitus: Secondary | ICD-10-CM | POA: Diagnosis not present

## 2020-11-30 DIAGNOSIS — Z87891 Personal history of nicotine dependence: Secondary | ICD-10-CM

## 2020-11-30 DIAGNOSIS — Z820 Family history of epilepsy and other diseases of the nervous system: Secondary | ICD-10-CM

## 2020-11-30 DIAGNOSIS — I5022 Chronic systolic (congestive) heart failure: Secondary | ICD-10-CM | POA: Diagnosis present

## 2020-11-30 DIAGNOSIS — IMO0002 Reserved for concepts with insufficient information to code with codable children: Secondary | ICD-10-CM | POA: Diagnosis present

## 2020-11-30 DIAGNOSIS — E876 Hypokalemia: Secondary | ICD-10-CM | POA: Diagnosis present

## 2020-11-30 DIAGNOSIS — R338 Other retention of urine: Secondary | ICD-10-CM | POA: Diagnosis present

## 2020-11-30 DIAGNOSIS — E1165 Type 2 diabetes mellitus with hyperglycemia: Secondary | ICD-10-CM | POA: Diagnosis not present

## 2020-11-30 DIAGNOSIS — E119 Type 2 diabetes mellitus without complications: Secondary | ICD-10-CM | POA: Diagnosis present

## 2020-11-30 DIAGNOSIS — E785 Hyperlipidemia, unspecified: Secondary | ICD-10-CM | POA: Diagnosis present

## 2020-11-30 DIAGNOSIS — K9189 Other postprocedural complications and disorders of digestive system: Principal | ICD-10-CM | POA: Diagnosis present

## 2020-11-30 DIAGNOSIS — Z885 Allergy status to narcotic agent status: Secondary | ICD-10-CM | POA: Diagnosis not present

## 2020-11-30 DIAGNOSIS — I252 Old myocardial infarction: Secondary | ICD-10-CM

## 2020-11-30 DIAGNOSIS — Z23 Encounter for immunization: Secondary | ICD-10-CM

## 2020-11-30 DIAGNOSIS — M1990 Unspecified osteoarthritis, unspecified site: Secondary | ICD-10-CM | POA: Diagnosis present

## 2020-11-30 DIAGNOSIS — M51369 Other intervertebral disc degeneration, lumbar region without mention of lumbar back pain or lower extremity pain: Secondary | ICD-10-CM | POA: Diagnosis present

## 2020-11-30 DIAGNOSIS — Z20822 Contact with and (suspected) exposure to covid-19: Secondary | ICD-10-CM | POA: Diagnosis present

## 2020-11-30 DIAGNOSIS — I110 Hypertensive heart disease with heart failure: Secondary | ICD-10-CM | POA: Diagnosis present

## 2020-11-30 DIAGNOSIS — I255 Ischemic cardiomyopathy: Secondary | ICD-10-CM | POA: Diagnosis present

## 2020-11-30 DIAGNOSIS — K567 Ileus, unspecified: Secondary | ICD-10-CM

## 2020-11-30 DIAGNOSIS — E669 Obesity, unspecified: Secondary | ICD-10-CM | POA: Diagnosis not present

## 2020-11-30 DIAGNOSIS — D509 Iron deficiency anemia, unspecified: Secondary | ICD-10-CM | POA: Diagnosis not present

## 2020-11-30 DIAGNOSIS — E7800 Pure hypercholesterolemia, unspecified: Secondary | ICD-10-CM | POA: Diagnosis not present

## 2020-11-30 DIAGNOSIS — R531 Weakness: Secondary | ICD-10-CM | POA: Diagnosis not present

## 2020-11-30 DIAGNOSIS — M48062 Spinal stenosis, lumbar region with neurogenic claudication: Secondary | ICD-10-CM | POA: Diagnosis present

## 2020-11-30 DIAGNOSIS — R339 Retention of urine, unspecified: Secondary | ICD-10-CM | POA: Diagnosis present

## 2020-11-30 DIAGNOSIS — Z794 Long term (current) use of insulin: Secondary | ICD-10-CM | POA: Diagnosis not present

## 2020-11-30 DIAGNOSIS — Z79899 Other long term (current) drug therapy: Secondary | ICD-10-CM | POA: Diagnosis not present

## 2020-11-30 DIAGNOSIS — I10 Essential (primary) hypertension: Secondary | ICD-10-CM | POA: Diagnosis not present

## 2020-11-30 DIAGNOSIS — E1169 Type 2 diabetes mellitus with other specified complication: Secondary | ICD-10-CM | POA: Diagnosis present

## 2020-11-30 DIAGNOSIS — D519 Vitamin B12 deficiency anemia, unspecified: Secondary | ICD-10-CM | POA: Diagnosis not present

## 2020-11-30 DIAGNOSIS — I2510 Atherosclerotic heart disease of native coronary artery without angina pectoris: Secondary | ICD-10-CM | POA: Diagnosis present

## 2020-11-30 DIAGNOSIS — M5136 Other intervertebral disc degeneration, lumbar region: Secondary | ICD-10-CM | POA: Diagnosis present

## 2020-11-30 LAB — CBC WITH DIFFERENTIAL/PLATELET
Abs Immature Granulocytes: 0.02 10*3/uL (ref 0.00–0.07)
Basophils Absolute: 0.1 10*3/uL (ref 0.0–0.1)
Basophils Relative: 1 %
Eosinophils Absolute: 0.1 10*3/uL (ref 0.0–0.5)
Eosinophils Relative: 1 %
HCT: 31.6 % — ABNORMAL LOW (ref 39.0–52.0)
Hemoglobin: 10.6 g/dL — ABNORMAL LOW (ref 13.0–17.0)
Immature Granulocytes: 0 %
Lymphocytes Relative: 39 %
Lymphs Abs: 3.3 10*3/uL (ref 0.7–4.0)
MCH: 30.4 pg (ref 26.0–34.0)
MCHC: 33.5 g/dL (ref 30.0–36.0)
MCV: 90.5 fL (ref 80.0–100.0)
Monocytes Absolute: 0.9 10*3/uL (ref 0.1–1.0)
Monocytes Relative: 11 %
Neutro Abs: 4 10*3/uL (ref 1.7–7.7)
Neutrophils Relative %: 48 %
Platelets: 196 10*3/uL (ref 150–400)
RBC: 3.49 MIL/uL — ABNORMAL LOW (ref 4.22–5.81)
RDW: 17.2 % — ABNORMAL HIGH (ref 11.5–15.5)
WBC: 8.3 10*3/uL (ref 4.0–10.5)
nRBC: 0 % (ref 0.0–0.2)

## 2020-11-30 LAB — COMPREHENSIVE METABOLIC PANEL
ALT: 19 U/L (ref 0–44)
AST: 30 U/L (ref 15–41)
Albumin: 3.8 g/dL (ref 3.5–5.0)
Alkaline Phosphatase: 42 U/L (ref 38–126)
Anion gap: 9 (ref 5–15)
BUN: 19 mg/dL (ref 8–23)
CO2: 25 mmol/L (ref 22–32)
Calcium: 9.2 mg/dL (ref 8.9–10.3)
Chloride: 106 mmol/L (ref 98–111)
Creatinine, Ser: 0.73 mg/dL (ref 0.61–1.24)
GFR, Estimated: >60 mL/min (ref >60)
Glucose, Bld: 173 mg/dL — ABNORMAL HIGH (ref 70–99)
Potassium: 3.2 mmol/L — ABNORMAL LOW (ref 3.5–5.1)
Sodium: 140 mmol/L (ref 135–145)
Total Bilirubin: 0.6 mg/dL (ref 0.3–1.2)
Total Protein: 6.7 g/dL (ref 6.5–8.1)

## 2020-11-30 LAB — RESP PANEL BY RT-PCR (FLU A&B, COVID) ARPGX2
Influenza A by PCR: NEGATIVE
Influenza B by PCR: NEGATIVE
SARS Coronavirus 2 by RT PCR: NEGATIVE

## 2020-11-30 MED ORDER — INSULIN ASPART 100 UNIT/ML IJ SOLN
0.0000 [IU] | Freq: Three times a day (TID) | INTRAMUSCULAR | Status: DC
  Administered 2020-12-01: 1 [IU] via SUBCUTANEOUS

## 2020-11-30 MED ORDER — POTASSIUM CHLORIDE CRYS ER 20 MEQ PO TBCR
40.0000 meq | EXTENDED_RELEASE_TABLET | Freq: Once | ORAL | Status: AC
  Administered 2020-12-01: 40 meq via ORAL
  Filled 2020-11-30: qty 2

## 2020-11-30 MED ORDER — OXYCODONE-ACETAMINOPHEN 5-325 MG PO TABS
2.0000 | ORAL_TABLET | Freq: Once | ORAL | Status: AC
Start: 2020-11-30 — End: 2020-11-30
  Administered 2020-11-30: 2 via ORAL
  Filled 2020-11-30: qty 2

## 2020-11-30 MED ORDER — OXYCODONE-ACETAMINOPHEN 5-325 MG PO TABS
1.0000 | ORAL_TABLET | Freq: Four times a day (QID) | ORAL | Status: DC | PRN
  Administered 2020-12-01: 1 via ORAL
  Filled 2020-11-30: qty 1

## 2020-11-30 MED ORDER — LISINOPRIL 20 MG PO TABS
20.0000 mg | ORAL_TABLET | Freq: Every day | ORAL | Status: DC
  Administered 2020-12-01 – 2020-12-04 (×4): 20 mg via ORAL
  Filled 2020-11-30 (×4): qty 1

## 2020-11-30 MED ORDER — INSULIN GLARGINE 100 UNIT/ML ~~LOC~~ SOLN
20.0000 [IU] | Freq: Two times a day (BID) | SUBCUTANEOUS | Status: DC
  Administered 2020-12-01 – 2020-12-02 (×4): 20 [IU] via SUBCUTANEOUS
  Filled 2020-11-30 (×5): qty 0.2

## 2020-11-30 MED ORDER — ROSUVASTATIN CALCIUM 20 MG PO TABS
20.0000 mg | ORAL_TABLET | Freq: Every day | ORAL | Status: DC
  Administered 2020-12-01 – 2020-12-04 (×4): 20 mg via ORAL
  Filled 2020-11-30 (×4): qty 1

## 2020-11-30 MED ORDER — DOCUSATE SODIUM 100 MG PO CAPS
100.0000 mg | ORAL_CAPSULE | Freq: Two times a day (BID) | ORAL | Status: DC
  Administered 2020-12-01: 100 mg via ORAL
  Filled 2020-11-30: qty 1

## 2020-11-30 MED ORDER — ACETAMINOPHEN 650 MG RE SUPP: 650.0000 mg | Freq: Four times a day (QID) | RECTAL | Status: DC | PRN

## 2020-11-30 MED ORDER — AMLODIPINE BESYLATE 5 MG PO TABS
5.0000 mg | ORAL_TABLET | Freq: Every day | ORAL | Status: DC
  Administered 2020-12-01 – 2020-12-04 (×4): 5 mg via ORAL
  Filled 2020-11-30 (×4): qty 1

## 2020-11-30 MED ORDER — DOCUSATE SODIUM 100 MG PO CAPS
200.0000 mg | ORAL_CAPSULE | Freq: Once | ORAL | Status: AC
  Administered 2020-12-01: 200 mg via ORAL
  Filled 2020-11-30: qty 2

## 2020-11-30 MED ORDER — ACETAMINOPHEN 325 MG PO TABS
650.0000 mg | ORAL_TABLET | Freq: Four times a day (QID) | ORAL | Status: DC | PRN
  Administered 2020-12-01: 650 mg via ORAL
  Filled 2020-11-30: qty 2

## 2020-11-30 MED ORDER — POLYETHYLENE GLYCOL 3350 17 G PO PACK
17.0000 g | PACK | Freq: Two times a day (BID) | ORAL | Status: DC
  Administered 2020-12-01 – 2020-12-04 (×7): 17 g via ORAL
  Filled 2020-11-30 (×7): qty 1

## 2020-11-30 MED ORDER — METOPROLOL TARTRATE 25 MG PO TABS
25.0000 mg | ORAL_TABLET | Freq: Two times a day (BID) | ORAL | Status: DC
  Administered 2020-12-01 – 2020-12-04 (×8): 25 mg via ORAL
  Filled 2020-11-30 (×8): qty 1

## 2020-11-30 NOTE — ED Triage Notes (Signed)
Patient arrived by [facility] from home with complain of urinary retention since early am. Patient had back surgery on Thursday and also has had constipation. Taking narcotics for the back pain. Patient states bladder pressure

## 2020-11-30 NOTE — ED Notes (Signed)
Pt was transferred from wheel chair at bedside to bed with assistance from two staff members and this nurse. Pt was unable to bare weight or assist in this transfer. Son at bedside reported that this has been pt baseline since his surgery and voiced concern for his ability to safely provide at home care. Further, pt has poor insight into current state, poor safety awareness, and difficulty following directions. Pt appears to forget that urinary catheter is present.

## 2020-11-30 NOTE — H&P (Signed)
History and Physical    PLEASE NOTE THAT DRAGON DICTATION SOFTWARE WAS USED IN THE CONSTRUCTION OF THIS NOTE.   Tyler Garner WUJ:[id number] DOB: 08/12/1955 DOA: 11/30/2020  PCP: Bennie PieriniMartin, [name], Garner Patient coming from: home   I have personally briefly reviewed patient's [facility]  Chief Complaint: Generalized weakness  HPI: Tyler Garner is a 65 y.o. male with medical history significant for chronic systolic heart failure with echocardiogram in 2017 showing LVEF 40 to 45%, type 2 diabetes mellitus, degenerative disc disease of the lumbar spine status post recent lumbar decompression, hypertension, hyperlipidemia who is [facility] on 11/30/2020 with generalized weakness after [facility] ED complaining of such.   In the setting of a documented history of degenerative disc disease involving the lumbar spine, the patient underwent lumbar decompression surgery as an outpatient on 11/28/2020 via Dr.Brooks of [facility].  No reported significant intraoperative complications, the patient was ultimately discharged home on Percocet 10/325 p.o. every 6 hours as needed instructions routine follow-up in orthopedic surgery clinic.  However, in the interval since this procedure, the patient reports generalized weakness, in the absence of any acute focal weakness, acute focal numbness, paresthesias, facial droop, dysarthria, acute change in vision, vertigo.  He also denies any associated saddle anesthesia.  Denies any associated acute lower extremity weakness postoperatively, rather emphasizes that his weakness is general in nature, and has been impacting his ability to ambulate without assistance at home, where he is requiring at least 1 person assistance with transfers via his son.  In the setting of this new onset generalized weakness, son conveys that he is unable to provide the level of care that the patient currently requires with  ambulation, transfers, and associated assistance with completion of ADLs.   Additionally, patient notes that his most recent bowel movement occurred the day before a lumbar decompression surgery, and states that this is relative to his preoperative baseline bowel habits in which he typically has 1-2 daily bowel movements associated with well formed stool.  In the interval, he conveys that he continues to pass flatus.  No overt associated abdominal pain.  Denies any preceding melena or hematochezia.  Not associate with any nausea/vomiting.  Following recent lumbar decompression, patient reports that he has been taking his Percocet 10/325 on a as needed basis that equates to 3-4 doses per 24-hour period over that time.  He acknowledges that he has not been on any oral bowel regimen in the postoperative period, including no laxatives or stool softeners.   He also notes decline in urine output in the postoperative period.  While he conveys that he is continue to pass some urine, he notes significant decline in his daily volume of urine output over the last 2 days following lumbar decompression.  Not associate with any acute dysuria, gross hematuria.  Denies any known history of underlying urinary obstruction, including no history of BPH.   Denies any recent subjective fever, chills, rigors, or generalized myalgias.  No recent headache, neck stiffness, rhinitis, rhinorrhea, sore throat, shortness of breath, wheezing, cough, or rash.  No recent known COVID-19 exposures.  Denies any recent chest pain, palpitations, diaphoresis, dizziness, recent PE, or syncope.    ED Course:  Vital signs in the ED were notable for the following: Temperature max 98.8, heart rate 83-1 04; blood pressure 116/59 -145/71, respiratory rate 16-21, oxygen saturation 94 to 100% on room air.  Labs were notable for the following:  CMP was notable for the following: Sodium 140, potassium 3.2, bicarbonate 25, BUN 19, creatinine 0.73  relative to most recent prior value 0.68 on 11/26/2020, glucose 173, liver enzymes were found to be within normal limits.  CBC notable for white blood cell count of 8300.  Screening nasopharyngeal COVID-19/influenza PCR were checked in the ED today with result currently pending.  Plain film of the abdomen showed no evidence of bowel obstruction, while showing scattered large and small bowel gas, potentially representing mild postoperative ileus, in the absence of any evidence of free air under the diaphragm.  The EDP discussed the patient's case and imaging with the on-call orthopedic surgeon from [facility], Dr. Victorino Dike, who did not feel that the patient's presentation was consistent with any red flag signs, but rather was being driven by postoperative ileus resulting in urinary retention.  He recommended initiation of oral bowel regimen, as well as maintaining scheduled outpatient follow-up in orthopedic surgery clinic.  Overall, he felt that the patient was stable from an orthopedic standpoint, without need for interval orthopedic involvement leading up to scheduled outpatient follow-up unless subsequent clinical change.   While in the ED, the following were administered: (none).     Review of Systems: As per HPI otherwise 10 point review of systems negative.   Past Medical History:  Diagnosis Date   Arthritis    CAD (coronary artery disease)    a. inferior STEMI (05/2013) with RV involvement- LHC:  dist CFX occluded (Promus premier (2.5x24 mm) DES); EF 40-45% with inf HK;  b. Echo (report pending) with EF 45-50%, lat WMA, RVE, mod RV hypokinesis    Diabetes mellitus without complication (HCC)    Hyperglycemia    Hyperlipidemia    Hypertension    Ischemic cardiomyopathy    Myocardial infarction (HCC) 2015   Nicotine addiction    Renal colic    Right shoulder injury 10/11/2009    Past Surgical History:  Procedure Laterality Date    heart stent     LEFT HEART CATHETERIZATION WITH  CORONARY ANGIOGRAM N/A 06/21/2013   Procedure: LEFT HEART CATHETERIZATION WITH CORONARY ANGIOGRAM;  Surgeon: Peter M Swaziland, MD;  Location: [facility] CATH LAB;  Service: Cardiovascular;  Laterality: N/A;   LUMBAR LAMINECTOMY/DECOMPRESSION MICRODISCECTOMY N/A 11/28/2020   Procedure: LUMBAR DECOMPRESSION  2 LEVELS, L3 and L5;  Surgeon: Venita Lick, MD;  Location: [facility] OR;  Service: Orthopedics;  Laterality: N/A;  3.5 hrs    Social History:  reports that he has quit smoking. He has quit using smokeless tobacco. He reports current alcohol use. He reports that he does not use drugs.   Allergies  Allergen Reactions   Percocet [Oxycodone-Acetaminophen] Nausea And Vomiting    Family History  Problem Relation Age of Onset   Diabetes Mother    ALS Mother    Alzheimer's disease Father    Healthy Sister    Diabetes Brother     Family history reviewed and not pertinent    Prior to Admission medications   Medication Sig Start Date End Date Taking? Authorizing Provider  acetaminophen (TYLENOL) 650 MG CR tablet Take 650 mg by mouth every 8 (eight) hours as needed for pain.   Yes [provider]  amLODipine (NORVASC) 5 MG tablet Take 1 tablet (5 mg total) by mouth daily. 11/18/20  Yes Tyler Garner  glimepiride (AMARYL) 4 MG tablet Take 1 tablet (4 mg total) by mouth 2 (two) times daily. 11/18/20  Yes Daphine Deutscher, [name], Garner  glucose blood (ACCU-CHEK ACTIVE STRIPS)  test strip Check blood sugar daily and prn  Dx E11.9 08/04/16  Yes Daphine Deutscher, [name], Garner  insulin glargine (LANTUS SOLOSTAR) 100 UNIT/ML Solostar Pen Inject 60 Units into the skin 2 (two) times daily. 11/18/20  Yes Tyler Garner  Insulin Pen Needle (PEN NEEDLES) 31G X 8 MM MISC Use with lantus solosar pen daily 06/15/17  Yes Tyler Garner  JANUMET 50-1000 MG tablet Take 1 tablet by mouth 2 (two) times daily. 11/18/20  Yes Tyler Garner  lisinopril (ZESTRIL) 20 MG tablet Take 1 tablet  (20 mg total) by mouth daily. 11/18/20  Yes Daphine Deutscher, [name], Garner  methocarbamol (ROBAXIN) 500 MG tablet Take 1 tablet (500 mg total) by mouth every 8 (eight) hours as needed for up to 5 days for muscle spasms. 11/28/20 12/03/20 Yes Venita Lick, MD  metoprolol tartrate (LOPRESSOR) 25 MG tablet Take 1 tablet (25 mg total) by mouth 2 (two) times daily. 11/18/20  Yes Daphine Deutscher, [name], Garner  ondansetron (ZOFRAN) 4 MG tablet Take 1 tablet (4 mg total) by mouth every 8 (eight) hours as needed for nausea or vomiting. 11/28/20  Yes Venita Lick, MD  oxyCODONE-acetaminophen (PERCOCET) 10-325 MG tablet Take 1 tablet by mouth every 6 (six) hours as needed for up to 5 days for pain. 11/28/20 12/03/20 Yes Venita Lick, MD  rosuvastatin (CRESTOR) 40 MG tablet Take 0.5 tablets (20 mg total) by mouth daily. 11/18/20  Yes Daphine Deutscher, [name], Garner     Objective    Physical Exam: Vitals:   11/30/20 1942 11/30/20 1945 11/30/20 2100 11/30/20 2206  BP:  136/64  (!) 116/59  Pulse: (!) 102 (!) 115 94 83  Resp: 15 (!) 32 (!) 21 18  Temp:      SpO2: 96% 96% 100% 95%    General: appears to be stated age; alert, oriented Skin: warm, dry, no rash Head:  AT/[place] Mouth:  Oral mucosa membranes appear moist, normal dentition Neck: supple; trachea midline Heart:  RRR; did not appreciate any M/R/G Lungs: CTAB, did not appreciate any wheezes, rales, or rhonchi Abdomen: + BS; soft, ND, NT Vascular: 2+ pedal pulses b/l; 2+ radial pulses b/l Extremities: no peripheral edema, no muscle wasting Neuro: strength and sensation intact in upper and lower extremities b/l    Labs on Admission: I have personally reviewed following labs and imaging studies  CBC: Recent Labs  Lab 11/26/20 0921 11/30/20 1805  WBC 6.8 8.3  NEUTROABS  --  4.0  HGB 12.8* 10.6*  HCT 39.7 31.6*  MCV 91.1 90.5  PLT 272 196   Basic Metabolic Panel: Recent Labs  Lab 11/26/20 0921 11/30/20 1805  NA 141 140  K 4.0 3.2*  CL 106 106   CO2 26 25  GLUCOSE 89 173*  BUN 12 19  CREATININE 0.68 0.73  CALCIUM 9.2 9.2   GFR: Estimated Creatinine Clearance: 108.3 mL/min (by C-G formula based on SCr of 0.73 mg/dL). Liver Function Tests: Recent Labs  Lab 11/30/20 1805  AST 30  ALT 19  ALKPHOS 42  BILITOT 0.6  PROT 6.7  ALBUMIN 3.8   No results for input(s): LIPASE, AMYLASE in the last 168 hours. No results for input(s): AMMONIA in the last 168 hours. Coagulation Profile: Recent Labs  Lab 11/26/20 0921  INR 1.0   Cardiac Enzymes: No results for input(s): CKTOTAL, CKMB, CKMBINDEX, TROPONINI in the last 168 hours. BNP (last 3 results) No results for input(s): PROBNP in the last 8760 hours. HbA1C: Recent Labs    11/28/20 1539  HGBA1C 6.9*   CBG: Recent Labs  Lab 11/28/20 0558 11/28/20 1304 11/28/20 1532 11/28/20 2122 11/29/20 0554  GLUCAP 183* 230* 262* 293* 187*   Lipid Profile: No results for input(s): CHOL, HDL, LDLCALC, TRIG, CHOLHDL, LDLDIRECT in the last 72 hours. Thyroid Function Tests: No results for input(s): TSH, T4TOTAL, FREET4, T3FREE, THYROIDAB in the last 72 hours. Anemia Panel: No results for input(s): VITAMINB12, FOLATE, FERRITIN, TIBC, IRON, RETICCTPCT in the last 72 hours. Urine analysis:    Component Value Date/Time   COLORURINE YELLOW 11/26/2020 0921   APPEARANCEUR CLEAR 11/26/2020 0921   APPEARANCEUR Clear 01/31/2019 0819   LABSPEC 1.019 11/26/2020 0921   PHURINE 5.0 11/26/2020 0921   GLUCOSEU NEGATIVE 11/26/2020 0921   HGBUR NEGATIVE 11/26/2020 0921   BILIRUBINUR NEGATIVE 11/26/2020 0921   BILIRUBINUR Negative 01/31/2019 0819   KETONESUR 5 (A) 11/26/2020 0921   PROTEINUR NEGATIVE 11/26/2020 0921   NITRITE NEGATIVE 11/26/2020 0921   LEUKOCYTESUR NEGATIVE 11/26/2020 0921    Radiological Exams on Admission: DG Abdomen 1 View  Result Date: 11/30/2020 CLINICAL DATA:  Abdominal pain and possible constipation, initial encounter EXAM: ABDOMEN - 1 VIEW COMPARISON:   10/27/2018 FINDINGS: Scattered large and small bowel gas is noted. No definitive free air is seen on this limited exam no definitive obstructive changes seen. Degenerative changes of lumbar spine are noted. IMPRESSION: Limited exam due to patient's inability to lay supine. No obstructive changes are seen. Scattered large and small bowel gas is noted. This may represent a mild postoperative ileus. Correlate with the physical exam. Electronically Signed   By: Alcide Clever M.D.   On: 11/30/2020 19:45       Assessment/Plan   JARI DIPASQUALE is a 65 y.o. male with medical history significant for chronic systolic heart failure with echocardiogram in 2017 showing LVEF 40 to 45%, type 2 diabetes mellitus, degenerative disc disease of the lumbar spine status post recent lumbar decompression, hypertension, hyperlipidemia who is admitted to [facility] on 11/30/2020 with generalized weakness after presenting from home to [facility] ED complaining of such.    Principal Problem:   Generalized weakness Active Problems:   Hyperlipidemia   Uncontrolled diabetes mellitus (HCC)   Ileus, postoperative (HCC)   Acute urinary retention   Hypokalemia   DDD (degenerative disc disease), lumbar   Chronic systolic CHF (congestive heart failure) (HCC)      #) Generalized weakness: 2 days of generalized weakness in the absence of any acute focal weakness, including objective weakness in the bilateral lower extremities to suggest GERD signs in the context of lumbar decompression surgery on 11/28/2020.  Additionally, no evidence of acute focal neurologic deficits to suggest acute stroke.  Rather, the nature of his presentation as well as timing thereof.  More consistent with postoperative deconditioning.  No evidence of contributory underlying infectious process, although will check for urinary tract infection given relative urinary stasis in setting of acute urinary retention.  Additionally, screening COVID-19 PCR is pending  at this time.  No significant additional metabolic or electrolyte abnormalities to be contributing to presenting generalized weakness.  As a consequence of his recent generalized weakness, the patient is requiring greater than 1 person assist with transfers and with ambulation's as well as requiring heightened assistance with completion of ADLs relative to baseline requirements, and reportedly in excess of the patient's subcu abilities of providing at this time.  Plan: PT and OT consults have been placed.  Check urinalysis, follow-up result of screening nasopharyngeal COVID-19 PCR.  Check  TSH.  Fall precautions.  Repeat CMP and CBC in the morning.  Additionally, as the patient is reporting adequate pain control on his current Percocet 10/325 mg p.o. every 6 hours as needed in the postoperative setting, will reduce by half, while closely monitoring for ensuing adequate pain control.       #) Postoperative ileus: Diagnosis on the basis of patient's report of no postoperative evidence of arrhythmias.  Recent bowel movement occurred on the day prior to his lumbar decompression procedure, with presenting plain films demonstrating evidence of mild postoperative ileus in the absence of overt evidence to suggest bowel obstruction.  Additionally, plain films of the abdomen showed no evidence to suggest bowel perforation at this time.  Appears consistent with postoperative ileus, with contributing factors that include diminished amatory status over that time, as further detailed above, in addition to the constipating effects of new onset use of as needed Percocet over the last few days in the absence of any prior opioid use, with no interval compensatory oral bowel regimen.  Presentation does not appear to be consistent with red flag signs in the context of recent lumbar decompression, as confirmed via this evening's discussions with on-call orthopedic surgery, as further detailed above.  Of note, patient appears  euvolemic at this time, and in the context of a documented history of chronic systolic heart failure, will refrain from administration of IV fluids at this time to prevent ensuing volume overload.   Plan: Scheduled MiraLAX 17 g p.o. twice daily, with first dose now.  Scheduled Colace 100 mg p.o. twice daily, with first dose of 200 mg this evening.  PT/OT consults have been placed with emphasis on early ambulation.  Monitor strict I's and O's Daily weights.  Repeat CMP and CBC in the morning.  Reduction in dose of as needed Percocet, as further detailed above.      #) Acute urinary retention: Patient reports diminished urine output over the last 2 days following emergency progression and surgery, with postvoid residual performed in the ED today demonstrating 700 cc, with ensuing urine output of greater than 600 cc following placement of Foley catheter.  This is in the context of no known history of prior urinary retention, the patient denies any known history of BPH.  Given the timing of his urinary retention in the postoperative setting, with concomitant presenting postoperative ileus, suspect that his acute urinary retention is as a consequence of postoperative ileus and associated constipation.  Renal function at baseline.   Plan: will continue Foley catheter overnight, while attending to postoperative ileus, as further detailed above, including interval oral bowel regimen.  Anticipate removal of Foley catheter in the morning with post removal voiding trial prior to discharge from the hospital.  Repeat BMP in the morning.  Monitor strict I's and O's and daily weights.      #) Hypokalemia: Presenting labs reflect serum potassium 3.2.  Plan: Potassium chloride 40 mill equivalents p.o. x1.  Add on serum magnesium level.  Repeat BMP and serum magnesium levels in the morning.  Monitor on telemetry.        #) Type 2 diabetes mellitus: Most recent hemoglobin A1c noted to be 6.9% on 11/28/2020.   He is on Lantus 60 units subcu twice daily as well as glimepiride and Janumet as an outpatient.  Presenting blood sugar noted to be 173 in the absence of any anion gap metabolic acidosis.  We will initiate a portion of this home basal insulin, as further quantified below,  in order to reduce risk for ensuing development of hypoglycemia.  Plan: Lantus 20 units subcu twice daily, with next dose to occur now.  Hold home glimepiride and Janumet during this hospitalization.  Accu-Cheks before every meal and at bedtime with moderate dose sliding scale insulin.       #) Degenerative disc disease of the lumbar spine: Postop day #2, status post L3/L4 decompression via Dr. Shon Baton of [facility] on 11/28/2020.  Presentation not associate with any red flag signs, as further detailed above, including via discussions with on-call [facility] surgeon, Dr. Victorino Dike, this evening, as further detailed above, who recommended no need for interval orthopedic surgical intervention or evaluation at this time, including no need for additional imaging of the lumbar spine at present, but rather recommended maintenance of current outpatient follow-up appointment in orthopedic surgery clinic.  Plan: Maintain currently scheduled follow-up in outpatient orthopedic surgery clinic, as above.  Reduction in dose of as needed Percocet in the context of current report of adequate pain control and in the context of finding of postoperative ileus, as further detailed above.        #) Chronic systolic heart failure: Documented history of such with most recent echocardiogram performed in 2015 notable for LVEF 40 to 45%, on Lopressor and lisinopril as an outpatient.  Not on any scheduled diuretic medications at home.  No clinical evidence to suggest acutely decompensated heart failure at this time.  Plan: Monitor strict I's and O's and daily weights.  Add on serum magnesium level.  Continue home beta-blocker and lisinopril.  Repeat BMP in  the morning.       #) Hyperlipidemia: On high intensity rosuvastatin as an outpatient.  Plan: Continue home statin.    DVT prophylaxis: SCDs Code Status: Full code Family Communication: none Disposition Plan: Per Rounding Team Consults called: Dr Victorino Dike (on-call orthopedic surgeon via [facility], as further detailed above)  Admission status: Observation; med telemetry     Of note, this patient was added by me to the following Admit List/Treatment Team: [email or web address].      PLEASE NOTE THAT DRAGON DICTATION SOFTWARE WAS USED IN THE CONSTRUCTION OF THIS NOTE.   Angie Fava DO [facility] Pager [phone] From 6PM - 2AM  Otherwise, please contact night-coverage  [email or web address] Password [email or web address]   11/30/2020, 10:14 PM

## 2020-11-30 NOTE — ED Provider Notes (Signed)
[facility] EMERGENCY DEPARTMENT Provider Note   CSN: [id number] Arrival date & time: 11/30/20  1714     History No chief complaint on file.   Tyler Garner is a 65 y.o. male.  65 y.o male with a PMH of CAD, HTN, MI, renal colic presents to the ED with a chief complaint of urinary retention. Patient underwent lumbar decompression of Level 3 &5 x 2 days ago. He reports voiding a small amount after the procedure.  However, yesterday he was only able to void a very small amount as well, felt like he had some retention going.  According to son at the bedside, states the urine was very dark in nature.  Has not had any bowel movement since the surgery.  Ports prior to the surgery he was going daily, he is currently on hydrocodone for pain control, currently on no stool softener.  He denies any fever, abdominal pain, nausea, vomiting.  The history is provided by the patient and medical records.      Past Medical History:  Diagnosis Date   Arthritis    CAD (coronary artery disease)    a. inferior STEMI (05/2013) with RV involvement- LHC:  dist CFX occluded (Promus premier (2.5x24 mm) DES); EF 40-45% with inf HK;  b. Echo (report pending) with EF 45-50%, lat WMA, RVE, mod RV hypokinesis    Diabetes mellitus without complication (HCC)    Hyperglycemia    Hyperlipidemia    Hypertension    Ischemic cardiomyopathy    Myocardial infarction (HCC) 2015   Nicotine addiction    Renal colic    Right shoulder injury 10/11/2009    Patient Active Problem List   Diagnosis Date Noted   Spinal stenosis 11/28/2020   Morbid obesity (HCC) 05/24/2015   Arthritic-like pain 10/25/2014   Primary hypertension 07/18/2014   Uncontrolled diabetes mellitus (HCC) 07/18/2014   Coronary atherosclerosis of native coronary artery 06/24/2013   Hx of myocardial infarction 06/21/2013   Hyperlipidemia 08/27/2010    Past Surgical History:  Procedure Laterality Date    heart stent     LEFT  HEART CATHETERIZATION WITH CORONARY ANGIOGRAM N/A 06/21/2013   Procedure: LEFT HEART CATHETERIZATION WITH CORONARY ANGIOGRAM;  Surgeon: Peter M Swaziland, MD;  Location: [facility] CATH LAB;  Service: Cardiovascular;  Laterality: N/A;   LUMBAR LAMINECTOMY/DECOMPRESSION MICRODISCECTOMY N/A 11/28/2020   Procedure: LUMBAR DECOMPRESSION  2 LEVELS, L3 and L5;  Surgeon: Venita Lick, MD;  Location: [facility] OR;  Service: Orthopedics;  Laterality: N/A;  3.5 hrs       Family History  Problem Relation Age of Onset   Diabetes Mother    ALS Mother    Alzheimer's disease Father    Healthy Sister    Diabetes Brother     Social History   Tobacco Use   Smoking status: Former    Pack years: 0.00   Smokeless tobacco: Former  Building services engineer Use: Never used  Substance Use Topics   Alcohol use: Yes    Comment: Occasional Beer per patient   Drug use: No    Home Medications Prior to Admission medications   Medication Sig Start Date End Date Taking? Authorizing Provider  acetaminophen (TYLENOL) 650 MG CR tablet Take 650 mg by mouth every 8 (eight) hours as needed for pain.   Yes [provider]  amLODipine (NORVASC) 5 MG tablet Take 1 tablet (5 mg total) by mouth daily. 11/18/20  Yes Daphine Deutscher, [name], [name]  glimepiride (AMARYL) 4 MG  tablet Take 1 tablet (4 mg total) by mouth 2 (two) times daily. 11/18/20  Yes Daphine Deutscher, [name], [name]  glucose blood (ACCU-CHEK ACTIVE STRIPS) test strip Check blood sugar daily and prn  Dx E11.9 08/04/16  Yes Daphine Deutscher, [name], [name]  insulin glargine (LANTUS SOLOSTAR) 100 UNIT/ML Solostar Pen Inject 60 Units into the skin 2 (two) times daily. 11/18/20  Yes Martin, [name], [name]  Insulin Pen Needle (PEN NEEDLES) 31G X 8 MM MISC Use with lantus solosar pen daily 06/15/17  Yes Martin, [name], [name]  JANUMET 50-1000 MG tablet Take 1 tablet by mouth 2 (two) times daily. 11/18/20  Yes Martin, [name], [name]  lisinopril (ZESTRIL) 20 MG tablet Take 1 tablet (20  mg total) by mouth daily. 11/18/20  Yes Daphine Deutscher, [name], [name]  methocarbamol (ROBAXIN) 500 MG tablet Take 1 tablet (500 mg total) by mouth every 8 (eight) hours as needed for up to 5 days for muscle spasms. 11/28/20 12/03/20 Yes Venita Lick, MD  metoprolol tartrate (LOPRESSOR) 25 MG tablet Take 1 tablet (25 mg total) by mouth 2 (two) times daily. 11/18/20  Yes Daphine Deutscher, [name], [name]  ondansetron (ZOFRAN) 4 MG tablet Take 1 tablet (4 mg total) by mouth every 8 (eight) hours as needed for nausea or vomiting. 11/28/20  Yes Venita Lick, MD  oxyCODONE-acetaminophen (PERCOCET) 10-325 MG tablet Take 1 tablet by mouth every 6 (six) hours as needed for up to 5 days for pain. 11/28/20 12/03/20 Yes Venita Lick, MD  rosuvastatin (CRESTOR) 40 MG tablet Take 0.5 tablets (20 mg total) by mouth daily. 11/18/20  Yes Bennie Pierini, [name]    Allergies    Percocet [oxycodone-acetaminophen]  Review of Systems   Review of Systems  Constitutional:  Negative for chills and fever.  HENT:  Negative for sore throat.   Respiratory:  Negative for shortness of breath.   Cardiovascular:  Negative for chest pain.  Gastrointestinal:  Positive for constipation. Negative for abdominal pain, nausea and vomiting.  Genitourinary:  Negative for flank pain.  Musculoskeletal:  Positive for back pain (at his baseline post op).  Skin:  Negative for pallor and wound.  Neurological:  Negative for light-headedness.  All other systems reviewed and are negative.  Physical Exam Updated Vital Signs BP 136/64   Pulse 94   Temp 98.8 F (37.1 C)   Resp (!) 21   SpO2 100%   Physical Exam Vitals and nursing note reviewed.  Constitutional:      Appearance: Normal appearance. He is not ill-appearing.  HENT:     Head: Normocephalic and atraumatic.     Nose: Nose normal.     Mouth/Throat:     Mouth: Mucous membranes are moist.  Eyes:     Pupils: Pupils are equal, round, and reactive to light.  Cardiovascular:      Rate and Rhythm: Normal rate.     Comments: No calf swelling bilaterally, no pitting edema, compression socks in place. Pulmonary:     Effort: Pulmonary effort is normal.     Breath sounds: No wheezing.  Abdominal:     General: Abdomen is flat. Bowel sounds are decreased. There is distension.     Tenderness: There is no right CVA tenderness or left CVA tenderness.     Comments: Bowel sounds are diminished, abdomen is distended, soft and nontender to palpation.  Musculoskeletal:     Cervical back: Normal range of motion and neck supple.     Right lower leg: No edema.     Left lower leg: No  edema.  Skin:    General: Skin is warm and dry.  Neurological:     Mental Status: He is alert and oriented to person, place, and time.    ED Results / Procedures / Treatments   Labs (all labs ordered are listed, but only abnormal results are displayed) Labs Reviewed  CBC WITH DIFFERENTIAL/PLATELET - Abnormal; Notable for the following components:      Result Value   RBC 3.49 (*)    Hemoglobin 10.6 (*)    HCT 31.6 (*)    RDW 17.2 (*)    All other components within normal limits  COMPREHENSIVE METABOLIC PANEL - Abnormal; Notable for the following components:   Potassium 3.2 (*)    Glucose, Bld 173 (*)    All other components within normal limits    EKG None  Radiology DG Abdomen 1 View  Result Date: 11/30/2020 CLINICAL DATA:  Abdominal pain and possible constipation, initial encounter EXAM: ABDOMEN - 1 VIEW COMPARISON:  10/27/2018 FINDINGS: Scattered large and small bowel gas is noted. No definitive free air is seen on this limited exam no definitive obstructive changes seen. Degenerative changes of lumbar spine are noted. IMPRESSION: Limited exam due to patient's inability to lay supine. No obstructive changes are seen. Scattered large and small bowel gas is noted. This may represent a mild postoperative ileus. Correlate with the physical exam. Electronically Signed   By: Alcide Clever M.D.    On: 11/30/2020 19:45    Procedures Procedures   Medications Ordered in ED Medications  oxyCODONE-acetaminophen (PERCOCET/ROXICET) 5-325 MG per tablet 2 tablet (2 tablets Oral Given 11/30/20 2054)    ED Course  I have reviewed the triage vital signs and the nursing notes.  Pertinent labs & imaging results that were available during my care of the patient were reviewed by me and considered in my medical decision making (see chart for details).    MDM Rules/Calculators/A&P  Patient with a past medical history of hypertension, CAD presents to the ED status post lumbar decompression of L3 and L5 by Dr. Shon Baton with urinary retention.  Reports last time he voided was before the surgery, only voiding a small amount yesterday.  On arrival, bladder scanner remarkable for around 700 mls of urine.  Patient is also currently on medication for pain control hydrocodone along with muscle relaxers.  He reports no bowel movement since his surgery as well.  During my evaluation his bowel sounds are diminished, abdomen appears distended, he feels like he has the urge to the bowel movement however has been unable to do so.  He is currently not on any stool softeners.  He is currently wearing compression socks, without any calf tenderness.  Overall nontoxic, non-ill-appearing.  A Foley catheter was placed with approximately output of 600 mL of urine, patient does report relief after.  Screening labs such as CBC and CMP were obtained to rule out any AKI.  We will also touch base with Dr. Shon Baton of [facility] for any further recommendations.  KUB has also been added as patient reports no bowel movement since his surgery.  Interpretation of his blood reveal a CBC with no leukocytosis, hemoglobin is slightly decreased but this is expected postop.  CMP with slight hypokalemia.  Creatinine level is unremarkable despite retaining urine for the past 12 hours.  LFTs are unremarkable.  X-ray of his abdomen showed: Limited  exam due to patient's inability to lay supine. No  obstructive changes are seen. Scattered large and small bowel gas  is  noted. This may represent a mild postoperative ileus. Correlate with  the physical exam.      8:09 PM I spoke to Dr. Victorino Dike of [facility], who was informed of patient's visit in the ED, he recommended Colace along with a bottle magnesium citrate in order to the patient with bowel movement.  He is stable from a orthopedic condition.  I have also contacted social work in order to obtain some home health invention in order to help patient's son with care of him at home.   9:59 PM a PT and OT consult were placed for patient.  Son is at the bedside crying that he is unable to take care of him at home.  Patient tried to ambulate to the bathroom, however is not able to ambulate at this time, was assisted by 4 people in order to transfer from wheelchair onto the bed.  I suspect that patient is not safe for disposition home at this time.  Will place call for hospitalist admission.  10:05 PM spoke to hospitalist service Dr. Arlean Hopping, appreciate his assistance. Patient will be admitted for further management.    Portions of this note were generated with Scientist, clinical (histocompatibility and immunogenetics). Dictation errors may occur despite best attempts at proofreading.  Final Clinical Impression(s) / ED Diagnoses Final diagnoses:  Urinary retention  Ileus of unspecified type [facility])    Rx / DC Orders ED Discharge Orders     None        Claude Manges, Cordelia Poche 11/30/20 2209    Wynetta Fines, MD 11/30/20 2342

## 2020-12-01 DIAGNOSIS — R531 Weakness: Secondary | ICD-10-CM

## 2020-12-01 DIAGNOSIS — I2510 Atherosclerotic heart disease of native coronary artery without angina pectoris: Secondary | ICD-10-CM

## 2020-12-01 DIAGNOSIS — K9189 Other postprocedural complications and disorders of digestive system: Principal | ICD-10-CM

## 2020-12-01 DIAGNOSIS — I10 Essential (primary) hypertension: Secondary | ICD-10-CM

## 2020-12-01 DIAGNOSIS — E876 Hypokalemia: Secondary | ICD-10-CM | POA: Diagnosis present

## 2020-12-01 DIAGNOSIS — K567 Ileus, unspecified: Secondary | ICD-10-CM | POA: Diagnosis present

## 2020-12-01 DIAGNOSIS — I5022 Chronic systolic (congestive) heart failure: Secondary | ICD-10-CM | POA: Diagnosis present

## 2020-12-01 DIAGNOSIS — M5136 Other intervertebral disc degeneration, lumbar region: Secondary | ICD-10-CM | POA: Diagnosis present

## 2020-12-01 DIAGNOSIS — M51369 Other intervertebral disc degeneration, lumbar region without mention of lumbar back pain or lower extremity pain: Secondary | ICD-10-CM | POA: Diagnosis present

## 2020-12-01 DIAGNOSIS — R338 Other retention of urine: Secondary | ICD-10-CM | POA: Diagnosis present

## 2020-12-01 DIAGNOSIS — E1165 Type 2 diabetes mellitus with hyperglycemia: Secondary | ICD-10-CM

## 2020-12-01 LAB — TSH: TSH: 1.577 u[IU]/mL (ref 0.350–4.500)

## 2020-12-01 LAB — URINALYSIS, ROUTINE W REFLEX MICROSCOPIC
Bilirubin Urine: NEGATIVE
Glucose, UA: 50 mg/dL — AB
Ketones, ur: 5 mg/dL — AB
Leukocytes,Ua: NEGATIVE
Nitrite: NEGATIVE
Protein, ur: NEGATIVE mg/dL
Specific Gravity, Urine: 1.019 (ref 1.005–1.030)
pH: 5 (ref 5.0–8.0)

## 2020-12-01 LAB — BRAIN NATRIURETIC PEPTIDE: B Natriuretic Peptide: 69.4 pg/mL (ref 0.0–100.0)

## 2020-12-01 LAB — GLUCOSE, CAPILLARY
Glucose-Capillary: 135 mg/dL — ABNORMAL HIGH (ref 70–99)
Glucose-Capillary: 159 mg/dL — ABNORMAL HIGH (ref 70–99)
Glucose-Capillary: 210 mg/dL — ABNORMAL HIGH (ref 70–99)
Glucose-Capillary: 225 mg/dL — ABNORMAL HIGH (ref 70–99)

## 2020-12-01 LAB — MAGNESIUM
Magnesium: 1.6 mg/dL — ABNORMAL LOW (ref 1.7–2.4)
Magnesium: 1.7 mg/dL (ref 1.7–2.4)

## 2020-12-01 LAB — COMPREHENSIVE METABOLIC PANEL
ALT: 19 U/L (ref 0–44)
AST: 25 U/L (ref 15–41)
Albumin: 3.6 g/dL (ref 3.5–5.0)
Alkaline Phosphatase: 39 U/L (ref 38–126)
Anion gap: 10 (ref 5–15)
BUN: 15 mg/dL (ref 8–23)
CO2: 29 mmol/L (ref 22–32)
Calcium: 9.3 mg/dL (ref 8.9–10.3)
Chloride: 99 mmol/L (ref 98–111)
Creatinine, Ser: 0.76 mg/dL (ref 0.61–1.24)
GFR, Estimated: >60 mL/min (ref >60)
Glucose, Bld: 175 mg/dL — ABNORMAL HIGH (ref 70–99)
Potassium: 3.5 mmol/L (ref 3.5–5.1)
Sodium: 138 mmol/L (ref 135–145)
Total Bilirubin: 0.8 mg/dL (ref 0.3–1.2)
Total Protein: 6.7 g/dL (ref 6.5–8.1)

## 2020-12-01 LAB — HIV ANTIBODY (ROUTINE TESTING W REFLEX): HIV Screen 4th Generation wRfx: NONREACTIVE

## 2020-12-01 LAB — CBC
HCT: 31.4 % — ABNORMAL LOW (ref 39.0–52.0)
Hemoglobin: 10.1 g/dL — ABNORMAL LOW (ref 13.0–17.0)
MCH: 29.8 pg (ref 26.0–34.0)
MCHC: 32.2 g/dL (ref 30.0–36.0)
MCV: 92.6 fL (ref 80.0–100.0)
Platelets: 223 10*3/uL (ref 150–400)
RBC: 3.39 MIL/uL — ABNORMAL LOW (ref 4.22–5.81)
RDW: 17.2 % — ABNORMAL HIGH (ref 11.5–15.5)
WBC: 7.1 10*3/uL (ref 4.0–10.5)
nRBC: 0 % (ref 0.0–0.2)

## 2020-12-01 MED ORDER — DOCUSATE SODIUM 100 MG PO CAPS
200.0000 mg | ORAL_CAPSULE | Freq: Two times a day (BID) | ORAL | Status: DC
  Administered 2020-12-01: 200 mg via ORAL
  Filled 2020-12-01: qty 2

## 2020-12-01 MED ORDER — BETHANECHOL CHLORIDE 25 MG PO TABS
25.0000 mg | ORAL_TABLET | Freq: Three times a day (TID) | ORAL | Status: DC
  Administered 2020-12-01 – 2020-12-04 (×11): 25 mg via ORAL
  Filled 2020-12-01 (×12): qty 1

## 2020-12-01 MED ORDER — BISACODYL 10 MG RE SUPP
10.0000 mg | Freq: Every day | RECTAL | Status: DC
  Administered 2020-12-01: 10 mg via RECTAL
  Filled 2020-12-01: qty 1

## 2020-12-01 MED ORDER — ACETAMINOPHEN 500 MG PO TABS
1000.0000 mg | ORAL_TABLET | Freq: Three times a day (TID) | ORAL | Status: DC
  Administered 2020-12-01 – 2020-12-04 (×10): 1000 mg via ORAL
  Filled 2020-12-01 (×10): qty 2

## 2020-12-01 MED ORDER — MAGNESIUM OXIDE -MG SUPPLEMENT 400 (240 MG) MG PO TABS
400.0000 mg | ORAL_TABLET | Freq: Two times a day (BID) | ORAL | Status: DC
  Administered 2020-12-01 – 2020-12-04 (×7): 400 mg via ORAL
  Filled 2020-12-01 (×8): qty 1

## 2020-12-01 MED ORDER — OXYCODONE HCL 5 MG PO TABS
5.0000 mg | ORAL_TABLET | Freq: Four times a day (QID) | ORAL | Status: DC | PRN
  Administered 2020-12-02 – 2020-12-04 (×5): 5 mg via ORAL
  Filled 2020-12-01 (×5): qty 1

## 2020-12-01 MED ORDER — MAGNESIUM SULFATE 2 GM/50ML IV SOLN
2.0000 g | Freq: Once | INTRAVENOUS | Status: DC
  Filled 2020-12-01: qty 50

## 2020-12-01 MED ORDER — INSULIN ASPART 100 UNIT/ML IJ SOLN
0.0000 [IU] | Freq: Every day | INTRAMUSCULAR | Status: DC
  Administered 2020-12-01: 2 [IU] via SUBCUTANEOUS
  Administered 2020-12-02: 4 [IU] via SUBCUTANEOUS

## 2020-12-01 MED ORDER — INSULIN ASPART 100 UNIT/ML IJ SOLN
4.0000 [IU] | Freq: Three times a day (TID) | INTRAMUSCULAR | Status: DC
  Administered 2020-12-01 – 2020-12-02 (×3): 4 [IU] via SUBCUTANEOUS

## 2020-12-01 MED ORDER — POTASSIUM CHLORIDE CRYS ER 20 MEQ PO TBCR
40.0000 meq | EXTENDED_RELEASE_TABLET | Freq: Once | ORAL | Status: AC
  Administered 2020-12-01: 40 meq via ORAL
  Filled 2020-12-01: qty 2

## 2020-12-01 MED ORDER — INSULIN ASPART 100 UNIT/ML IJ SOLN
0.0000 [IU] | Freq: Three times a day (TID) | INTRAMUSCULAR | Status: DC
  Administered 2020-12-01: 5 [IU] via SUBCUTANEOUS
  Administered 2020-12-02 (×2): 3 [IU] via SUBCUTANEOUS
  Administered 2020-12-02: 2 [IU] via SUBCUTANEOUS
  Administered 2020-12-03: 11 [IU] via SUBCUTANEOUS
  Administered 2020-12-03: 8 [IU] via SUBCUTANEOUS

## 2020-12-01 MED ORDER — CHLORHEXIDINE GLUCONATE CLOTH 2 % EX PADS
6.0000 | MEDICATED_PAD | Freq: Every day | CUTANEOUS | Status: DC
  Administered 2020-12-01 – 2020-12-04 (×4): 6 via TOPICAL

## 2020-12-01 NOTE — Progress Notes (Signed)
Attempted inserting PIV; but pt pulled back when needle was attempted to be inserted and screamed. Pt refused PIV insertion after.

## 2020-12-01 NOTE — Plan of Care (Signed)

## 2020-12-01 NOTE — Progress Notes (Signed)
PROGRESS NOTE  KAYLIB FURNESS ZOX:[id number] DOB: February 12, 1956   PCP: Bennie Pierini, [name]  Patient is from: Home  DOA: 11/30/2020 LOS: 1  Chief complaints:  Generalized weakness    Brief Narrative / Interim history: 65 year old M with PMH of systolic CHF, DM-2, HTN, HLD and lumbar DDD s/p recent lumbar decompression on 7/7 by Dr. Shon Baton of [facility] returning to ED that day after discharge with generalized weakness, and admitted with postop ileus, generalized weakness and urinary retention.  He had 600 cc UOP upon Foley insertion.  Patient was on Percocet 10/325 mg every 6 hours as needed pain after surgery.  He has not had a bowel movement since surgery.  He was not on bowel regimen.  Orthopedic surgery was consulted in ED, and and did not feel interval intervention by Ortho is indicated.  Subjective: Seen and examined earlier this morning.  No major events overnight of this morning.  Reports lower back pain.  He rates his pain 6/10.  He denies radiation to his legs.  Denies numbness or tingling.  He admits passing gas but no bowel movement yet.  He denies nausea, vomiting, chest pain or dyspnea.  Objective: Vitals:   12/01/20 0500 12/01/20 0528 12/01/20 0528 12/01/20 0700  BP: 118/66 139/72 139/72 139/64  Pulse: 88 89 89 83  Resp: 20 20 17 18   Temp:  98.8 F (37.1 C) 98.8 F (37.1 C) 98.1 F (36.7 C)  TempSrc:  Oral Oral Oral  SpO2: 98% 100% 100% 99%    Intake/Output Summary (Last 24 hours) at 12/01/2020 1221 Last data filed at 12/01/2020 0900 Gross per 24 hour  Intake 240 ml  Output 2000 ml  Net -1760 ml   There were no vitals filed for this visit.  Examination:  GENERAL: No apparent distress.  Nontoxic. HEENT: MMM.  Vision and hearing grossly intact.  NECK: Supple.  No apparent JVD.  RESP: On RA.  No IWOB.  Fair aeration bilaterally. CVS:  RRR. Heart sounds normal.  ABD/GI/GU: BS+. Abd soft, NTND.  Indwelling Foley catheter. MSK/EXT:  Moves extremities. No  apparent deformity. No edema.  SKIN: Honeycomb dressing over lumbar spine area DCI. NEURO: Awake, alert and oriented appropriately.  No apparent focal neuro deficit. PSYCH: Calm. Normal affect.   Procedures:  None  Microbiology summarized: COVID-19 and influenza PCR nonreactive.  Assessment & Plan: Generalized weakness-likely related to recent surgery, opiate and ileus.  No cardiopulmonary symptoms.  Appears euvolemic but difficult exam due to body habitus.  Low suspicion for infectious process.  TSH within normal.  He is anemia is not bad enough to cause his symptoms. -Check BNP+/- echocardiogram -Check CK -PT/OT  Postoperative ileus-likely due to pain meds.  He is passing gas but no bowel movement yet.  No nausea or vomiting.  Abdominal exam benign. -Continue scheduled MiraLAX twice daily -Dulcolax suppository  Acute urinary retention: Denies history of BPH.  Could be due to opiate and ileus.  Renal function stable. -Start bethanechol 25 mg 3 times daily -Voiding trial once he has a bowel movement. -May consider renal 02/01/2021 if renal function worse.  Chronic systolic CHF: "Echo (report pending) with EF 45-50%, lat WMA, RVE, mod RV hypokinesis".  He does not seem to be on diuretics.  No cardiopulmonary symptoms.  Appears euvolemic but difficult exam due to body habitus. -Check BNP +/- echocardiogram -Monitor fluid and respiratory status.  History of inferior STEMI  s/p DES to distal CFX-patient without chest pain or respiratory symptoms. -Continue home metoprolol and  lisinopril  Controlled IDDM-2 with hyperlipidemia and hyperglycemia: A1c 6.9%.  On Lantus 60 units twice daily, Amaryl, Janumet and Crestor Recent Labs  Lab 11/28/20 1532 11/28/20 2122 11/29/20 0554 12/01/20 0633 12/01/20 1140  GLUCAP 262* 293* 187* 135* 159*  -Continue Lantus 20 units twice daily -Increase SSI to moderate -Added NovoLog 4 units 3 times daily with meals -Continue Crestor.   Essential  hypertension: Normotensive. -Cardiac meds as above  Hypokalemia/hypomagnesemia: K3.5.  Mg 1.6. -K-Dur 40 mill equivalent x1 -P.o. magnesium oxide 400 mg twice daily.  Patient refused IV placement for IV magnesium sulfate.  Lumbar DDD s/p decompression on 11/28/2020 by Dr. Shon Baton at [facility].  Has honeycomb dressing over surgical site that appears DTI. -Continue brace -EDP discussed with an on-call orthopedic surgeon, Dr. Victorino Dike. No ortho intervention indicated -PT/OT  Normocytic anemia: Slight drop in Hgb from baseline but stable. Recent Labs    02/22/20 1023 05/28/20 0905 08/13/20 0836 11/18/20 0929 11/26/20 0921 11/30/20 1805 12/01/20 0445  HGB 13.9 13.4 13.0 12.8* 12.8* 10.6* 10.1*  -Check anemia panel in the morning -Continue monitoring    There is no height or weight on file to calculate BMI.         DVT prophylaxis:  SCDs Start: 11/30/20 2214  Code Status: Full code Family Communication: Patient and/or RN. Available if any question.  Level of care: Telemetry Medical Status is: Inpatient  Remains inpatient appropriate because:Persistent severe electrolyte disturbances, Ongoing diagnostic testing needed not appropriate for outpatient work up, and Unsafe d/c plan  Dispo: The patient is from: Home              Anticipated d/c is to: SNF              Patient currently is not medically stable to d/c.   Difficult to place patient No       Consultants:  None   Sch Meds:  Scheduled Meds:  amLODipine  5 mg Oral Daily   bethanechol  25 mg Oral TID   bisacodyl  10 mg Rectal Daily   docusate sodium  100 mg Oral BID   insulin aspart  0-9 Units Subcutaneous TID WC   insulin glargine  20 Units Subcutaneous BID   lisinopril  20 mg Oral Daily   magnesium oxide  400 mg Oral BID   metoprolol tartrate  25 mg Oral BID   polyethylene glycol  17 g Oral BID   rosuvastatin  20 mg Oral Daily   Continuous Infusions: PRN Meds:.acetaminophen **OR** acetaminophen,  oxyCODONE-acetaminophen  Antimicrobials: Anti-infectives (From admission, onward)    None        I have personally reviewed the following labs and images: CBC: Recent Labs  Lab 11/26/20 0921 11/30/20 1805 12/01/20 0445  WBC 6.8 8.3 7.1  NEUTROABS  --  4.0  --   HGB 12.8* 10.6* 10.1*  HCT 39.7 31.6* 31.4*  MCV 91.1 90.5 92.6  PLT 272 196 223   BMP &GFR Recent Labs  Lab 11/26/20 0921 11/30/20 1805 12/01/20 0445 12/01/20 0517  NA 141 140 138  --   K 4.0 3.2* 3.5  --   CL 106 106 99  --   CO2 26 25 29   --   GLUCOSE 89 173* 175*  --   BUN 12 19 15   --   CREATININE 0.68 0.73 0.76  --   CALCIUM 9.2 9.2 9.3  --   MG  --   --  1.7 1.6*   Estimated Creatinine Clearance:  108.3 mL/min (by C-G formula based on SCr of 0.76 mg/dL). Liver & Pancreas: Recent Labs  Lab 11/30/20 1805 12/01/20 0445  AST 30 25  ALT 19 19  ALKPHOS 42 39  BILITOT 0.6 0.8  PROT 6.7 6.7  ALBUMIN 3.8 3.6   No results for input(s): LIPASE, AMYLASE in the last 168 hours. No results for input(s): AMMONIA in the last 168 hours. Diabetic: Recent Labs    11/28/20 1539  HGBA1C 6.9*   Recent Labs  Lab 11/28/20 1532 11/28/20 2122 11/29/20 0554 12/01/20 0633 12/01/20 1140  GLUCAP 262* 293* 187* 135* 159*   Cardiac Enzymes: No results for input(s): CKTOTAL, CKMB, CKMBINDEX, TROPONINI in the last 168 hours. No results for input(s): PROBNP in the last 8760 hours. Coagulation Profile: Recent Labs  Lab 11/26/20 0921  INR 1.0   Thyroid Function Tests: Recent Labs    12/01/20 0517  TSH 1.577   Lipid Profile: No results for input(s): CHOL, HDL, LDLCALC, TRIG, CHOLHDL, LDLDIRECT in the last 72 hours. Anemia Panel: No results for input(s): VITAMINB12, FOLATE, FERRITIN, TIBC, IRON, RETICCTPCT in the last 72 hours. Urine analysis:    Component Value Date/Time   COLORURINE YELLOW 11/30/2020 0053   APPEARANCEUR CLEAR 11/30/2020 0053   APPEARANCEUR Clear 01/31/2019 0819   LABSPEC 1.019  11/30/2020 0053   PHURINE 5.0 11/30/2020 0053   GLUCOSEU 50 (A) 11/30/2020 0053   HGBUR SMALL (A) 11/30/2020 0053   BILIRUBINUR NEGATIVE 11/30/2020 0053   BILIRUBINUR Negative 01/31/2019 0819   KETONESUR 5 (A) 11/30/2020 0053   PROTEINUR NEGATIVE 11/30/2020 0053   NITRITE NEGATIVE 11/30/2020 0053   LEUKOCYTESUR NEGATIVE 11/30/2020 0053   Sepsis Labs: Invalid input(s): PROCALCITONIN, LACTICIDVEN  Microbiology: Recent Results (from the past 240 hour(s))  Surgical pcr screen     Status: None   Collection Time: 11/26/20  9:19 AM   Specimen: Nasal Mucosa; Nasal Swab  Result Value Ref Range Status   MRSA, PCR NEGATIVE NEGATIVE Final   Staphylococcus aureus NEGATIVE NEGATIVE Final    Comment: (NOTE) The Xpert SA Assay (FDA approved for NASAL specimens in patients 37 years of age and older), is one component of a comprehensive surveillance program. It is not intended to diagnose infection nor to guide or monitor treatment. Performed at [facility], [address], [place], [place] [address]   SARS CORONAVIRUS 2 (TAT 6-24 HRS) Nasopharyngeal Nasopharyngeal Swab     Status: None   Collection Time: 11/26/20  9:20 AM   Specimen: Nasopharyngeal Swab  Result Value Ref Range Status   SARS Coronavirus 2 NEGATIVE NEGATIVE Final    Comment: (NOTE) SARS-CoV-2 target nucleic acids are NOT DETECTED.  The SARS-CoV-2 RNA is generally detectable in upper and lower respiratory specimens during the acute phase of infection. Negative results do not preclude SARS-CoV-2 infection, do not rule out co-infections with other pathogens, and should not be used as the sole basis for treatment or other patient management decisions. Negative results must be combined with clinical observations, patient history, and epidemiological information. The expected result is Negative.  Fact Sheet for Patients: HairSlick.no  Fact Sheet for Healthcare  Providers: quierodirigir.com  This test is not yet approved or cleared by the [place] FDA and  has been authorized for detection and/or diagnosis of SARS-CoV-2 by FDA under an Emergency Use Authorization (EUA). This EUA will remain  in effect (meaning this test can be used) for the duration of the COVID-19 declaration under Se ction 564(b)(1) of the Act, 21 U.S.C.  section 360bbb-3(b)(1), unless the authorization is terminated or revoked sooner.  Performed at [facility], [address], [place], [place] [address]   Resp Panel by RT-PCR (Flu A&B, Covid) Nasopharyngeal Swab     Status: None   Collection Time: 11/30/20 10:17 PM   Specimen: Nasopharyngeal Swab; Nasopharyngeal(NP) swabs in vial transport medium  Result Value Ref Range Status   SARS Coronavirus 2 by RT PCR NEGATIVE NEGATIVE Final    Comment: (NOTE) SARS-CoV-2 target nucleic acids are NOT DETECTED.  The SARS-CoV-2 RNA is generally detectable in upper respiratory specimens during the acute phase of infection. The lowest concentration of SARS-CoV-2 viral copies this assay can detect is 138 copies/mL. A negative result does not preclude SARS-Cov-2 infection and should not be used as the sole basis for treatment or other patient management decisions. A negative result may occur with  improper specimen collection/handling, submission of specimen other than nasopharyngeal swab, presence of viral mutation(s) within the areas targeted by this assay, and inadequate number of viral copies(<138 copies/mL). A negative result must be combined with clinical observations, patient history, and epidemiological information. The expected result is Negative.  Fact Sheet for Patients:  BloggerCourse.com  Fact Sheet for Healthcare Providers:  SeriousBroker.it  This test is no t yet approved or cleared by the [place] FDA and  has been authorized  for detection and/or diagnosis of SARS-CoV-2 by FDA under an Emergency Use Authorization (EUA). This EUA will remain  in effect (meaning this test can be used) for the duration of the COVID-19 declaration under Section 564(b)(1) of the Act, 21 U.S.C.section 360bbb-3(b)(1), unless the authorization is terminated  or revoked sooner.       Influenza A by PCR NEGATIVE NEGATIVE Final   Influenza B by PCR NEGATIVE NEGATIVE Final    Comment: (NOTE) The Xpert Xpress SARS-CoV-2/FLU/RSV plus assay is intended as an aid in the diagnosis of influenza from Nasopharyngeal swab specimens and should not be used as a sole basis for treatment. Nasal washings and aspirates are unacceptable for Xpert Xpress SARS-CoV-2/FLU/RSV testing.  Fact Sheet for Patients: BloggerCourse.com  Fact Sheet for Healthcare Providers: SeriousBroker.it  This test is not yet approved or cleared by the [place] FDA and has been authorized for detection and/or diagnosis of SARS-CoV-2 by FDA under an Emergency Use Authorization (EUA). This EUA will remain in effect (meaning this test can be used) for the duration of the COVID-19 declaration under Section 564(b)(1) of the Act, 21 U.S.C. section 360bbb-3(b)(1), unless the authorization is terminated or revoked.  Performed at [facility], [address] [place], [place] [address]     Radiology Studies: DG Abdomen 1 View  Result Date: 11/30/2020 CLINICAL DATA:  Abdominal pain and possible constipation, initial encounter EXAM: ABDOMEN - 1 VIEW COMPARISON:  10/27/2018 FINDINGS: Scattered large and small bowel gas is noted. No definitive free air is seen on this limited exam no definitive obstructive changes seen. Degenerative changes of lumbar spine are noted. IMPRESSION: Limited exam due to patient's inability to lay supine. No obstructive changes are seen. Scattered large and small bowel gas is noted. This may  represent a mild postoperative ileus. Correlate with the physical exam. Electronically Signed   By: Alcide Clever M.D.   On: 11/30/2020 19:45      Yaziel Brandon T. Elexius Minar [facility]  If 7PM-7AM, please contact night-coverage [email or web address] 12/01/2020, 12:21 PM

## 2020-12-01 NOTE — Evaluation (Signed)
Occupational Therapy Evaluation Patient Details Name: Tyler Garner MRN: [id number] DOB: 1956/03/29 Today's Date: 12/01/2020    History of Present Illness 65 year old male [facility] on [facility] with complaint of urinary retention, constipation, and weakness. Pt s/p L2, 3 and 5 decompression on 7/7, d/c 7/8. PMH includes obesity, OA, HTN, DM, HF, HLD, CAD with STEMI.   Clinical Impression   Pt was admitted for concerns listed above. PTA pt underwent a Lumbar decompression, in which he went home and required max assist from his family, before that pt reported independence with all ADL's and IADL's. At this time pt presents with decreased activity tolerance and increased weakness. Pt requires mod-max A +2 assist for all functional mobility and min-max A +2 for all ADL's. Pt will benefit from skilled intensive therapies prior to returning home to address his strength, functional mobility, ADL performance, balance, and safety. Acute OT will follow to address deficits below.     Follow Up Recommendations  SNF;Supervision/Assistance - 24 hour    Equipment Recommendations  Other (comment) (TBD)    Recommendations for Other Services       Precautions / Restrictions Precautions Precautions: Fall;Back Precaution Booklet Issued: Yes (comment) Precaution Comments: pt recalls BLT rules, log roll technique OOB Required Braces or Orthoses: Spinal Brace Spinal Brace: Lumbar corset;Applied in sitting position Restrictions Weight Bearing Restrictions: No      Mobility Bed Mobility Overal bed mobility: Needs Assistance Bed Mobility: Rolling;Sidelying to Sit;Sit to Sidelying Rolling: Mod assist Sidelying to sit: Max assist;+2 for physical assistance     Sit to sidelying: Max assist;+2 for physical assistance General bed mobility comments: max +2 for log roll technique to/from EOB, significant difficulty elevating trunk off of bed.    Transfers Overall transfer level: Needs  assistance Equipment used: Rolling walker (2 wheeled) Transfers: Sit to/from Stand Sit to Stand: Mod assist;Max assist;+2 physical assistance         General transfer comment: mod, evolving to max +2 for power up, posterior facilitation for hip extension, and semi-rise. Pt unable to come to full upright standing, x3 attempts and each time pt states "I need to sit down" and initiates sit.    Balance Overall balance assessment: History of Falls;Needs assistance Sitting-balance support: Feet supported;Single extremity supported Sitting balance-Leahy Scale: Fair Sitting balance - Comments: posterior leaning with fatigue, requires at least SL support Postural control: Posterior lean Standing balance support: Bilateral upper extremity supported;During functional activity Standing balance-Leahy Scale: Zero Standing balance comment: mod-max +2                           ADL either performed or assessed with clinical judgement   ADL Overall ADL's : Needs assistance/impaired Eating/Feeding: Independent;Bed level   Grooming: Minimal assistance;Bed level   Upper Body Bathing: Minimal assistance;Bed level   Lower Body Bathing: Maximal assistance;+2 for safety/equipment;+2 for physical assistance;Bed level   Upper Body Dressing : Minimal assistance;Bed level   Lower Body Dressing: Maximal assistance;+2 for physical assistance;+2 for safety/equipment;Bed level   Toilet Transfer: Moderate assistance;Maximal assistance;+2 for physical assistance;+2 for safety/equipment;Stand-pivot   Toileting- Clothing Manipulation and Hygiene: Maximal assistance;+2 for physical assistance;+2 for safety/equipment;Bed level   Tub/ Shower Transfer: Moderate assistance;Maximal assistance;+2 for physical assistance;+2 for safety/equipment;Stand-pivot   Functional mobility during ADLs: Moderate assistance;Maximal assistance;+2 for physical assistance;+2 for safety/equipment General ADL Comments: Pt is  limited by activity tolerace and increased weakness requiring pt to need min-max A +2 for all ADL's  Vision Baseline Vision/History: No visual deficits Patient Visual Report: No change from baseline Vision Assessment?: No apparent visual deficits     Perception Perception Perception Tested?: No   Praxis Praxis Praxis tested?: Not tested    Pertinent Vitals/Pain Pain Assessment: Faces Faces Pain Scale: Hurts whole lot Pain Location: LEs, R>L to knees Pain Descriptors / Indicators: Grimacing;Guarding;Sore;Moaning Pain Intervention(s): Limited activity within patient's tolerance;Monitored during session;Repositioned     Hand Dominance Right   Extremity/Trunk Assessment Upper Extremity Assessment Upper Extremity Assessment: Generalized weakness   Lower Extremity Assessment Lower Extremity Assessment: Defer to PT evaluation   Cervical / Trunk Assessment Cervical / Trunk Assessment: Other exceptions Cervical / Trunk Exceptions: spine surgery   Communication Communication Communication: No difficulties   Cognition Arousal/Alertness: Awake/alert Behavior During Therapy: WFL for tasks assessed/performed Overall Cognitive Status: Within Functional Limits for tasks assessed                                     General Comments  VSS On RA    Exercises     Shoulder Instructions      Home Living Family/patient expects to be discharged to:: Private residence Living Arrangements: Alone Available Help at Discharge: Family;Available PRN/intermittently Type of Home: House Home Access: Stairs to enter [employer] of Steps: 1 Entrance Stairs-Rails: None Home Layout: One level     Bathroom Shower/Tub: Tub/shower unit;Walk-in shower   Bathroom Toilet: Standard Bathroom Accessibility: Yes How Accessible: Accessible via walker Home Equipment: Walker - 2 wheels;Transport chair          Prior Functioning/Environment Level of Independence:  Needs assistance  Gait / Transfers Assistance Needed: since surgery, has been needing assist for transfers, gait ADL's / Homemaking Assistance Needed: has not performed ADLs since surgery, states he has been bedlevel.   Comments: Pt had an "assist to the floor" in the bathroom by son day of d/c        OT Problem List: Decreased strength;Decreased range of motion;Decreased activity tolerance;Impaired balance (sitting and/or standing);Decreased coordination;Decreased safety awareness;Decreased knowledge of use of DME or AE;Impaired UE functional use      OT Treatment/Interventions: Self-care/ADL training;Therapeutic exercise;Energy conservation;DME and/or AE instruction;Therapeutic activities;Patient/family education;Balance training    OT Goals(Current goals can be found in the care plan section) Acute Rehab OT Goals Patient Stated Goal: get pt stronger OT Goal Formulation: With patient Time For Goal Achievement: 12/15/20 Potential to Achieve Goals: Fair ADL Goals Pt Will Perform Grooming: with min guard assist;sitting Pt Will Perform Upper Body Bathing: with min guard assist;sitting Pt Will Perform Lower Body Bathing: with min assist;sitting/lateral leans;sit to/from stand Pt Will Perform Upper Body Dressing: with supervision;sitting Pt Will Perform Lower Body Dressing: with min assist;with adaptive equipment;sitting/lateral leans;sit to/from stand Pt Will Transfer to Toilet: with min assist;stand pivot transfer  OT Frequency: Min 2X/week   Barriers to D/C:            Co-evaluation PT/OT/SLP Co-Evaluation/Treatment: Yes Reason for Co-Treatment: For patient/therapist safety;To address functional/ADL transfers PT goals addressed during session: Mobility/safety with mobility;Balance;Proper use of DME OT goals addressed during session: ADL's and self-care;Proper use of Adaptive equipment and DME      AM-PAC OT "6 Clicks" Daily Activity     Outcome Measure Help from another  person eating meals?: A Little Help from another person taking care of personal grooming?: A Little Help from another person toileting, which includes using toliet, bedpan, or  urinal?: A Lot Help from another person bathing (including washing, rinsing, drying)?: A Lot Help from another person to put on and taking off regular upper body clothing?: A Lot Help from another person to put on and taking off regular lower body clothing?: A Lot 6 Click Score: 14   End of Session Equipment Utilized During Treatment: Rolling walker;Back brace Nurse Communication: Mobility status  Activity Tolerance: Patient tolerated treatment well Patient left: in bed;with call bell/phone within reach;with family/visitor present  OT Visit Diagnosis: Unsteadiness on feet (R26.81);History of falling (Z91.81);Muscle weakness (generalized) (M62.81)                Time: 6122-4497 OT Time Calculation (min): 26 min Charges:  OT General Charges $OT Visit: 1 Visit OT Evaluation $OT Eval Moderate Complexity: 1 Mod OT Treatments $Self Care/Home Management : 8-22 mins  Huong Luthi H., OTR/L Acute Rehabilitation  Siomara Burkel Elane Feleica Fulmore 12/01/2020, 9:23 PM

## 2020-12-01 NOTE — Evaluation (Signed)
Physical Therapy Evaluation Patient Details Name: Tyler Garner MRN: [id number] DOB: 02-20-1956 Today's Date: 12/01/2020   History of Present Illness  65 year old male presents to [facility] on 7/9 with complaint of urinary retention, constipation, and weakness. Pt s/p L2, 3 and 5 decompression on 7/7, d/c 7/8. PMH includes obesity, OA, HTN, DM, HF, HLD, CAD with STEMI.   Clinical Impression  Pt presents with weakness, back and LE pain, significant difficulty performing mobility tasks, and decreased activity tolerance. Pt to benefit from acute PT to address deficits. Pt requiring max +2 assist for bed mobility and attempted standing, pt unable to rise to full standing due to weakness and pain. At baseline, pt is independent with mobility, recommending ST-SNF for rehabilitation. PT to progress mobility as tolerated, and will continue to follow acutely.      Follow Up Recommendations SNF;Supervision/Assistance - 24 hour    Equipment Recommendations  None recommended by PT    Recommendations for Other Services       Precautions / Restrictions Precautions Precautions: Fall;Back Precaution Comments: pt recalls BLT rules, log roll technique OOB Required Braces or Orthoses: Spinal Brace Spinal Brace: Lumbar corset;Applied in sitting position      Mobility  Bed Mobility Overal bed mobility: Needs Assistance Bed Mobility: Rolling;Sidelying to Sit;Sit to Sidelying Rolling: Mod assist Sidelying to sit: Max assist;+2 for physical assistance     Sit to sidelying: Max assist;+2 for physical assistance General bed mobility comments: max +2 for log roll technique to/from EOB, significant difficulty elevating trunk off of bed.    Transfers Overall transfer level: Needs assistance Equipment used: Rolling walker (2 wheeled) Transfers: Sit to/from Stand Sit to Stand: Mod assist;Max assist;+2 physical assistance         General transfer comment: mod, evolving to max +2 for power up, posterior  facilitation for hip extension, and semi-rise. Pt unable to come to full upright standing, x3 attempts and each time pt states "I need to sit down" and initiates sit.  Ambulation/Gait             General Gait Details: unable  Stairs            Wheelchair Mobility    Modified Rankin (Stroke Patients Only)       Balance Overall balance assessment: History of Falls;Needs assistance Sitting-balance support: Feet supported;Single extremity supported Sitting balance-Leahy Scale: Fair Sitting balance - Comments: posterior leaning with fatigue, requires at least SL support   Standing balance support: Bilateral upper extremity supported;During functional activity Standing balance-Leahy Scale: Zero Standing balance comment: mod-max +2                             Pertinent Vitals/Pain Pain Assessment: Faces Faces Pain Scale: Hurts whole lot Pain Location: LEs, R>L to knees Pain Descriptors / Indicators: Grimacing;Guarding;Sore;Moaning Pain Intervention(s): Limited activity within patient's tolerance;Monitored during session;Repositioned    Home Living Family/patient expects to be discharged to:: Private residence Living Arrangements: Alone Available Help at Discharge: Family;Available PRN/intermittently Type of Home: House Home Access: Stairs to enter Entrance Stairs-Rails: None Entrance Stairs-Number of Steps: 1 Home Layout: One level Home Equipment: Environmental consultant - 2 wheels;Transport chair      Prior Function Level of Independence: Needs assistance   Gait / Transfers Assistance Needed: since surgery, has been needing assist for transfers, gait  ADL's / Homemaking Assistance Needed: has not performed ADLs since surgery, states he has been bedlevel.  Comments: Pt had an "assist to  the floor" in the bathroom by son day of d/c     Hand Dominance   Dominant Hand: Right    Extremity/Trunk Assessment   Upper Extremity Assessment Upper Extremity Assessment:  Defer to OT evaluation    Lower Extremity Assessment Lower Extremity Assessment: Generalized weakness (difficult to fully assess due to pain presentation; able to perform partial ROM hip and knee flex/ext, DF/PF, quad set)    Cervical / Trunk Assessment Cervical / Trunk Assessment: Other exceptions Cervical / Trunk Exceptions: spine surgery  Communication   Communication: No difficulties  Cognition Arousal/Alertness: Awake/alert Behavior During Therapy: WFL for tasks assessed/performed Overall Cognitive Status: Within Functional Limits for tasks assessed                                        General Comments      Exercises     Assessment/Plan    PT Assessment Patient needs continued PT services  PT Problem List Decreased strength;Decreased mobility;Decreased safety awareness;Decreased activity tolerance;Decreased balance;Decreased knowledge of use of DME;Pain;Decreased knowledge of precautions;Obesity       PT Treatment Interventions DME instruction;Therapeutic activities;Gait training;Therapeutic exercise;Patient/family education;Balance training;Neuromuscular re-education;Functional mobility training    PT Goals (Current goals can be found in the Care Plan section)  Acute Rehab PT Goals Patient Stated Goal: get pt stronger PT Goal Formulation: With patient/family Time For Goal Achievement: 12/15/20 Potential to Achieve Goals: Good    Frequency Min 5X/week   Barriers to discharge        Co-evaluation PT/OT/SLP Co-Evaluation/Treatment: Yes Reason for Co-Treatment: For patient/therapist safety;To address functional/ADL transfers PT goals addressed during session: Mobility/safety with mobility;Balance;Proper use of DME         AM-PAC PT "6 Clicks" Mobility  Outcome Measure Help needed turning from your back to your side while in a flat bed without using bedrails?: A Lot Help needed moving from lying on your back to sitting on the side of a flat  bed without using bedrails?: A Lot Help needed moving to and from a bed to a chair (including a wheelchair)?: Total Help needed standing up from a chair using your arms (e.g., wheelchair or bedside chair)?: A Lot Help needed to walk in hospital room?: Total Help needed climbing 3-5 steps with a railing? : Total 6 Click Score: 9    End of Session Equipment Utilized During Treatment: Back brace Activity Tolerance: Patient limited by pain;Patient limited by fatigue Patient left: in bed;with call bell/phone within reach;with bed alarm set;with family/visitor present Nurse Communication: Mobility status PT Visit Diagnosis: Other abnormalities of gait and mobility (R26.89);Muscle weakness (generalized) (M62.81);Difficulty in walking, not elsewhere classified (R26.2);Pain Pain - Right/Left: Right Pain - part of body: Leg    Time: 0927-0951 PT Time Calculation (min) (ACUTE ONLY): 24 min   Charges:   PT Evaluation $PT Eval Low Complexity: 1 Low         Shirin Echeverry S, PT DPT Acute Rehabilitation Services Pager [phone]  Office [phone]   Eura Radabaugh E Stroup 12/01/2020, 10:09 AM

## 2020-12-02 ENCOUNTER — Inpatient Hospital Stay (HOSPITAL_COMMUNITY): Payer: Medicare Other

## 2020-12-02 DIAGNOSIS — R338 Other retention of urine: Secondary | ICD-10-CM | POA: Diagnosis not present

## 2020-12-02 DIAGNOSIS — M5136 Other intervertebral disc degeneration, lumbar region: Secondary | ICD-10-CM | POA: Diagnosis not present

## 2020-12-02 DIAGNOSIS — I5022 Chronic systolic (congestive) heart failure: Secondary | ICD-10-CM | POA: Diagnosis not present

## 2020-12-02 DIAGNOSIS — R531 Weakness: Secondary | ICD-10-CM | POA: Diagnosis not present

## 2020-12-02 LAB — RENAL FUNCTION PANEL
Albumin: 3.5 g/dL (ref 3.5–5.0)
Anion gap: 9 (ref 5–15)
BUN: 14 mg/dL (ref 8–23)
CO2: 30 mmol/L (ref 22–32)
Calcium: 9.4 mg/dL (ref 8.9–10.3)
Chloride: 100 mmol/L (ref 98–111)
Creatinine, Ser: 0.71 mg/dL (ref 0.61–1.24)
GFR, Estimated: >60 mL/min (ref >60)
Glucose, Bld: 185 mg/dL — ABNORMAL HIGH (ref 70–99)
Phosphorus: 3.7 mg/dL (ref 2.5–4.6)
Potassium: 4.4 mmol/L (ref 3.5–5.1)
Sodium: 139 mmol/L (ref 135–145)

## 2020-12-02 LAB — GLUCOSE, CAPILLARY
Glucose-Capillary: 164 mg/dL — ABNORMAL HIGH (ref 70–99)
Glucose-Capillary: 186 mg/dL — ABNORMAL HIGH (ref 70–99)
Glucose-Capillary: 121 mg/dL — ABNORMAL HIGH (ref 70–99)
Glucose-Capillary: 343 mg/dL — ABNORMAL HIGH (ref 70–99)

## 2020-12-02 LAB — RETICULOCYTES
Immature Retic Fract: 29.7 % — ABNORMAL HIGH (ref 2.3–15.9)
RBC.: 3.62 MIL/uL — ABNORMAL LOW (ref 4.22–5.81)
Retic Count, Absolute: 55.7 10*3/uL (ref 19.0–186.0)
Retic Ct Pct: 1.5 % (ref 0.4–3.1)

## 2020-12-02 LAB — VITAMIN B12: Vitamin B-12: <50 pg/mL — ABNORMAL LOW (ref 180–914)

## 2020-12-02 LAB — IRON AND TIBC
Iron: 32 ug/dL — ABNORMAL LOW (ref 45–182)
Saturation Ratios: 7 % — ABNORMAL LOW (ref 17.9–39.5)
TIBC: 428 ug/dL (ref 250–450)
UIBC: 396 ug/dL

## 2020-12-02 LAB — FOLATE: Folate: 29.4 ng/mL (ref >5.9)

## 2020-12-02 LAB — CBC
HCT: 32.8 % — ABNORMAL LOW (ref 39.0–52.0)
Hemoglobin: 10.8 g/dL — ABNORMAL LOW (ref 13.0–17.0)
MCH: 29.7 pg (ref 26.0–34.0)
MCHC: 32.9 g/dL (ref 30.0–36.0)
MCV: 90.1 fL (ref 80.0–100.0)
Platelets: 258 10*3/uL (ref 150–400)
RBC: 3.64 MIL/uL — ABNORMAL LOW (ref 4.22–5.81)
RDW: 16.8 % — ABNORMAL HIGH (ref 11.5–15.5)
WBC: 7.1 10*3/uL (ref 4.0–10.5)
nRBC: 0 % (ref 0.0–0.2)

## 2020-12-02 LAB — CK: Total CK: 362 U/L (ref 49–397)

## 2020-12-02 LAB — FERRITIN: Ferritin: 63 ng/mL (ref 24–336)

## 2020-12-02 LAB — MAGNESIUM: Magnesium: 1.9 mg/dL (ref 1.7–2.4)

## 2020-12-02 MED ORDER — SENNOSIDES-DOCUSATE SODIUM 8.6-50 MG PO TABS
1.0000 | ORAL_TABLET | Freq: Two times a day (BID) | ORAL | Status: DC
  Administered 2020-12-02 – 2020-12-04 (×5): 1 via ORAL
  Filled 2020-12-02 (×5): qty 1

## 2020-12-02 MED ORDER — INSULIN ASPART 100 UNIT/ML IJ SOLN
8.0000 [IU] | Freq: Three times a day (TID) | INTRAMUSCULAR | Status: DC
  Administered 2020-12-03 – 2020-12-04 (×6): 8 [IU] via SUBCUTANEOUS

## 2020-12-02 MED ORDER — INSULIN GLARGINE 100 UNIT/ML ~~LOC~~ SOLN
30.0000 [IU] | Freq: Two times a day (BID) | SUBCUTANEOUS | Status: DC
  Administered 2020-12-02 – 2020-12-04 (×4): 30 [IU] via SUBCUTANEOUS
  Filled 2020-12-02 (×5): qty 0.3

## 2020-12-02 MED ORDER — HYDROMORPHONE HCL 1 MG/ML IJ SOLN
0.5000 mg | INTRAMUSCULAR | Status: DC | PRN
  Administered 2020-12-02: 0.5 mg via INTRAVENOUS
  Filled 2020-12-02: qty 0.5

## 2020-12-02 MED ORDER — SODIUM CHLORIDE 0.9 % IV SOLN: 6.0000 mg | INTRAVENOUS | Status: DC

## 2020-12-02 MED ORDER — DEXAMETHASONE SODIUM PHOSPHATE 10 MG/ML IJ SOLN
6.0000 mg | Freq: Four times a day (QID) | INTRAMUSCULAR | Status: AC
  Administered 2020-12-02 – 2020-12-03 (×4): 6 mg via INTRAVENOUS
  Filled 2020-12-02 (×4): qty 1

## 2020-12-02 MED ORDER — FERROUS SULFATE 325 (65 FE) MG PO TABS
325.0000 mg | ORAL_TABLET | Freq: Two times a day (BID) | ORAL | Status: DC
  Administered 2020-12-02 – 2020-12-04 (×6): 325 mg via ORAL
  Filled 2020-12-02 (×6): qty 1

## 2020-12-02 NOTE — Progress Notes (Signed)
Multiple attempts made to complete pt. Pt could not tolerate the table and had to be moved back to his bed. Called RN about pt's pain. RN came down and gave P.O. meds. We gave pt sufficient time for meds to take affect and moved the pt back to the exam table to make a 2nd attempt. Pt did not last more than the 30secs it takes to do a scout. Pt had to be moved again from exam table and another call was made to the floor. RN came down and gave pt more meds. During the 3rd attempt pt was able to get the rest of the lumbar without contrast done but pt refused to continue so that we could contrast. Pt and MRI exam table were held for 2hrs trying accomplish exam.

## 2020-12-02 NOTE — TOC Initial Note (Addendum)
Transition of Care [facility]) - Initial/Assessment Note    Patient Details  Name: Tyler Garner MRN: [id number] Date of Birth: Mar 19, 1956  [facility] CM/SW Contact:    Bess Kinds, RN Phone Number: [phone] 12/02/2020, 5:55 PM  Clinical Narrative:                  Spoke with patient at the bedside. PTA home alone, independent, drives. Discussed recommendations for short term SNF. Patient is agreeable - preference for SNF in [place]. Discussed SNF process. TOC following for transition needs.   1813: Faxed out to SNFs - pending bed offers.  Expected Discharge Plan: Skilled Nursing Facility Barriers to Discharge: Continued Medical Work up   Patient Goals and CMS Choice Patient states their goals for this hospitalization and ongoing recovery are:: skilled rehab then home CMS Medicare.gov Compare Post Acute Care list provided to:: Patient Choice offered to / list presented to : Patient  Expected Discharge Plan and Services Expected Discharge Plan: Skilled Nursing Facility In-house Referral: Clinical Social Work Discharge Planning Services: CM Consult Post Acute Care Choice: Skilled Nursing Facility Living arrangements for the past 2 months: Single Family Home                 DME Arranged: N/A DME Agency: NA       HH Arranged: NA HH Agency: NA        Prior Living Arrangements/Services Living arrangements for the past 2 months: Single Family Home Lives with:: Self Patient language and need for interpreter reviewed:: Yes        Need for Family Participation in Patient Care: Yes (Comment) Care giver support system in place?: Yes (comment)   Criminal Activity/Legal Involvement Pertinent to Current Situation/Hospitalization: No - Comment as needed  Activities of Daily Living      Permission Sought/Granted                  Emotional Assessment Appearance:: Appears stated age Attitude/Demeanor/Rapport: Engaged Affect (typically  observed): Accepting Orientation: : Oriented to Self, Oriented to  Time, Oriented to Place, Oriented to Situation Alcohol / Substance Use: Not Applicable Psych Involvement: No (comment)  Admission diagnosis:  Urinary retention [R33.9] Generalized weakness [R53.1] Ileus of unspecified type [facility]) [K56.7] Patient Active Problem List   Diagnosis Date Noted   Ileus, postoperative (HCC) 12/01/2020   Acute urinary retention 12/01/2020   Hypokalemia 12/01/2020   DDD (degenerative disc disease), lumbar 12/01/2020   Chronic systolic CHF (congestive heart failure) (HCC) 12/01/2020   Generalized weakness 11/30/2020   Spinal stenosis 11/28/2020   Morbid obesity (HCC) 05/24/2015   Arthritic-like pain 10/25/2014   Primary hypertension 07/18/2014   Uncontrolled diabetes mellitus (HCC) 07/18/2014   Coronary atherosclerosis of native coronary artery 06/24/2013   Hx of myocardial infarction 06/21/2013   Hyperlipidemia 08/27/2010   PCP:  Bennie Pierini, [name] Pharmacy:   [facility] [phone] - [place], [place] - [address] [place] [address] Phone: [phone] Fax: [phone]  [facility] [place], [place] - [address] [place] [address] Phone: [phone] Fax: [phone]     Social Determinants of Health (SDOH) Interventions    Readmission Risk Interventions No flowsheet data found.

## 2020-12-02 NOTE — NC FL2 (Signed)
[place] MEDICAID FL2 LEVEL OF CARE SCREENING TOOL     IDENTIFICATION  Patient Name: Tyler Garner Birthdate: 1955/07/05 Sex: male Admission Date (Current Location): 11/30/2020  [facility] and [place] Number:  Producer, television/film/video and Address:  [facility], [address] [place], [place] [address]      Provider Number: [id number]  Attending Physician Name and Address:  Almon Hercules, MD  Relative Name and Phone Number:       Current Level of Care: Hospital Recommended Level of Care: Skilled Nursing Facility Prior Approval Number:    Date Approved/Denied:   PASRR Number: [id number]  Discharge Plan: SNF    Current Diagnoses: Patient Active Problem List   Diagnosis Date Noted   Ileus, postoperative (HCC) 12/01/2020   Acute urinary retention 12/01/2020   Hypokalemia 12/01/2020   DDD (degenerative disc disease), lumbar 12/01/2020   Chronic systolic CHF (congestive heart failure) (HCC) 12/01/2020   Generalized weakness 11/30/2020   Spinal stenosis 11/28/2020   Morbid obesity (HCC) 05/24/2015   Arthritic-like pain 10/25/2014   Primary hypertension 07/18/2014   Uncontrolled diabetes mellitus (HCC) 07/18/2014   Coronary atherosclerosis of native coronary artery 06/24/2013   Hx of myocardial infarction 06/21/2013   Hyperlipidemia 08/27/2010    Orientation RESPIRATION BLADDER Height & Weight     Self, Time, Situation, Place  Normal Continent Weight:   Height:     BEHAVIORAL SYMPTOMS/MOOD NEUROLOGICAL BOWEL NUTRITION STATUS      Continent Diet  AMBULATORY STATUS COMMUNICATION OF NEEDS Skin   Limited Assist Verbally Surgical wounds (back)                       Personal Care Assistance Level of Assistance  Bathing, Feeding, Dressing Bathing Assistance: Limited assistance Feeding assistance: Independent Dressing Assistance: Limited assistance     Functional Limitations Info  Sight, Hearing, Speech Sight Info: Adequate Hearing Info:  Adequate Speech Info: Adequate    SPECIAL CARE FACTORS FREQUENCY  PT (By licensed PT), OT (By licensed OT)     PT Frequency: PT at SNF to eval and treat a minimum of 5 days a week OT Frequency: OT at SNF to eval and treat a minimum of 5 days a week            Contractures Contractures Info: Not present    Additional Factors Info  Code Status, Allergies Code Status Info: Full Allergies Info: percocet           Current Medications (12/02/2020):  This is the current hospital active medication list Current Facility-Administered Medications  Medication Dose Route Frequency Provider Last Rate Last Admin   acetaminophen (TYLENOL) tablet 1,000 mg  1,000 mg Oral Q8H Gonfa, Taye T, MD   1,000 mg at 12/02/20 1449   amLODipine (NORVASC) tablet 5 mg  5 mg Oral Daily Howerter, Justin B, DO   5 mg at 12/02/20 1021   bethanechol (URECHOLINE) tablet 25 mg  25 mg Oral TID Candelaria Stagers T, MD   25 mg at 12/02/20 1757   Chlorhexidine Gluconate Cloth 2 % PADS 6 each  6 each Topical Daily Candelaria Stagers T, MD   6 each at 12/02/20 1021   dexamethasone (DECADRON) injection 6 mg  6 mg Intravenous Q6H Pierce, Dwayne A, RPH   6 mg at 12/02/20 1757   ferrous sulfate tablet 325 mg  325 mg Oral BID WC Candelaria Stagers T, MD   325 mg at 12/02/20 1757  HYDROmorphone (DILAUDID) injection 0.5 mg  0.5 mg Intravenous Q4H PRN Candelaria Stagers T, MD   0.5 mg at 12/02/20 1345   insulin aspart (novoLOG) injection 0-15 Units  0-15 Units Subcutaneous TID WC Candelaria Stagers T, MD   2 Units at 12/02/20 1757   insulin aspart (novoLOG) injection 0-5 Units  0-5 Units Subcutaneous QHS Candelaria Stagers T, MD   2 Units at 12/01/20 2126   insulin aspart (novoLOG) injection 8 Units  8 Units Subcutaneous TID WC Gonfa, Taye T, MD       insulin glargine (LANTUS) injection 30 Units  30 Units Subcutaneous BID Gonfa, Taye T, MD       lisinopril (ZESTRIL) tablet 20 mg  20 mg Oral Daily Howerter, Justin B, DO   20 mg at 12/02/20 1021   magnesium oxide  (MAG-OX) tablet 400 mg  400 mg Oral BID Candelaria Stagers T, MD   400 mg at 12/02/20 1021   metoprolol tartrate (LOPRESSOR) tablet 25 mg  25 mg Oral BID Howerter, Justin B, DO   25 mg at 12/02/20 1021   oxyCODONE (Oxy IR/ROXICODONE) immediate release tablet 5 mg  5 mg Oral Q6H PRN Candelaria Stagers T, MD   5 mg at 12/02/20 1252   polyethylene glycol (MIRALAX / GLYCOLAX) packet 17 g  17 g Oral BID Howerter, Justin B, DO   17 g at 12/02/20 1020   rosuvastatin (CRESTOR) tablet 20 mg  20 mg Oral Daily Howerter, Justin B, DO   20 mg at 12/02/20 1021   senna-docusate (Senokot-S) tablet 1 tablet  1 tablet Oral BID Almon Hercules, MD   1 tablet at 12/02/20 1021     Discharge Medications: Please see discharge summary for a list of discharge medications.  Relevant Imaging Results:  Relevant Lab Results:   Additional Information SSN [id number]  Bess Kinds, RN

## 2020-12-02 NOTE — Progress Notes (Signed)
PROGRESS NOTE  Tyler LollJackie R Rigsbee WUJ:[id number] DOB: 11/06/1955   PCP: Bennie PieriniMartin, [name], [name]  Patient is from: Home  DOA: 11/30/2020 LOS: 2  Chief complaints:  Generalized weakness    Brief Narrative / Interim history: 65 year old M with PMH of systolic CHF, DM-2, HTN, HLD and lumbar DDD s/p recent lumbar decompression on 7/7 by Dr. Shon BatonBrooks of [facility] returning to ED that day after discharge with generalized weakness, and admitted with postop ileus, generalized weakness and urinary retention.  He had 600 cc UOP upon Foley insertion.  Patient was on Percocet 10/325 mg every 6 hours as needed pain after surgery.  He has not had a bowel movement since surgery.  He was not on bowel regimen.  On call orthopedic surgery, Dr. Victorino DikeHewitt consulted but did not feel there is need to orthopedic intervention.  Orthopedic surgery, Dr. Shon BatonBrooks evaluated patient, started Decadron and ordered lumbar MRI to exclude hematoma.  Low suspicion for cauda equina syndrome. Therapy recommended SNF.  Subjective: Seen and examined earlier this morning.  No major events overnight of this morning.  No complaint this morning.  No pain lying in bed.  Reports bowel movement yesterday.  No nausea or vomiting.  No numbness or tingling.  Objective: Vitals:   12/01/20 0700 12/01/20 1549 12/01/20 2118 12/02/20 0742  BP: 139/64 (!) 137/56 134/71 (!) 129/59  Pulse: 83 82 88 75  Resp: 18 18 20 18   Temp: 98.1 F (36.7 C) 98.3 F (36.8 C) 98.5 F (36.9 C) 98.3 F (36.8 C)  TempSrc: Oral  Oral Oral  SpO2: 99% 100% 100%     Intake/Output Summary (Last 24 hours) at 12/02/2020 1347 Last data filed at 12/02/2020 1044 Gross per 24 hour  Intake 480 ml  Output 2100 ml  Net -1620 ml   There were no vitals filed for this visit.  Examination:  GENERAL: No apparent distress.  Nontoxic. HEENT: MMM.  Vision and hearing grossly intact.  NECK: Supple.  No apparent JVD.  RESP: On RA.  No IWOB.  Fair aeration bilaterally. CVS:   RRR. Heart sounds normal.  ABD/GI/GU: BS+. Abd soft, NTND.  MSK/EXT:  Moves extremities. No apparent deformity. No edema.  SKIN: Honeycomb dressing over lumbar spine.  DCI. NEURO: Awake and alert. Oriented appropriately.  No apparent focal neuro deficit.  Normal and symmetrical strength in both legs.  PSYCH: Calm. Normal affect.   Procedures:  None  Microbiology summarized: COVID-19 and influenza PCR nonreactive.  Assessment & Plan: Generalized weakness-likely related to recent surgery, opiate and ileus.  No cardiopulmonary symptoms.  Appears euvolemic but difficult exam due to body habitus.  Low suspicion for infectious process.  TSH within normal.  He is anemia is not bad enough to cause his symptoms. -PT/OT-recommended SNF.  Lumbar DDD/spinal canal stenosis s/p decomp[facility] 11[facility]28/2020 [facility].  Has honeycomb dressing over surgical site that appears DCI.  Has urinary tension likely from ileus and constipation versus cauda equina syndrome.  Neuro exam reassuring. -Started on Decadron by orthopedic surgery. -Follow MRI lumbar spine to exclude hematoma -Pain control with scheduled Tylenol, as needed oxycodone and Dilaudid -Continue brace, bowel regimen -Continue PT/OT  Postoperative ileus-likely due to pain meds.  Seems to have resolved.  Had a bowel movement on 7/2.  No nausea or vomiting.  Abdominal exam benign. -Scheduled Senokot-S with as needed MiraLAX  Acute urinary retention: Denies history of BPH.  Could be due to opiate and ileus.  Renal function stable. -Start bethanechol 25 mg  3 times daily -Voiding trial today  Chronic systolic CHF: "Echo (report pending) with EF 45-50%, lat WMA, RVE, mod RV hypokinesis".  He does not seem to be on diuretics.  No cardiopulmonary symptoms.  Appears euvolemic but difficult exam due to body habitus. -Monitor fluid and respiratory status.  History of inferior STEMI  s/p DES to distal CFX-patient without chest pain or  respiratory symptoms. -Continue home metoprolol and lisinopril  Controlled IDDM-2 with hyperlipidemia and hyperglycemia: A1c 6.9%.  On Lantus 60 units twice daily, Amaryl, Janumet and Crestor Recent Labs  Lab 12/01/20 1140 12/01/20 1545 12/01/20 1959 12/02/20 0650 12/02/20 1112  GLUCAP 159* 210* 225* 164* 186*  -Increase Lantus from 20 to 30 units twice daily not using steroid -Increase NovoLog from 4 to 8 units 3 times daily with meals now he is on steroid -Continue SSI-moderate -Continue Crestor.  Essential hypertension: Normotensive. -Cardiac meds as above  Hypokalemia/hypomagnesemia: Resolved.  Iron deficiency anemia: H&H stable.  Anemia panel consistent with iron deficiency. Recent Labs    02/22/20 1023 05/28/20 0905 08/13/20 0836 11/18/20 0929 11/26/20 0921 11/30/20 1805 12/01/20 0445 12/02/20 0213  HGB 13.9 13.4 13.0 12.8* 12.8* 10.6* 10.1* 10.8*  -P.o. ferrous sulfate -Continue monitoring    There is no height or weight on file to calculate BMI.         DVT prophylaxis:  SCDs Start: 11/30/20 2214  Code Status: Full code Family Communication: Patient and/or RN. Available if any question.  Level of care: Med-Surg Status is: Inpatient  Remains inpatient appropriate because:Ongoing diagnostic testing needed not appropriate for outpatient work up, Unsafe d/c plan, and Inpatient level of care appropriate due to severity of illness  Dispo: The patient is from: Home              Anticipated d/c is to:  CIR or SNF              Patient currently is not medically stable to d/c.   Difficult to place patient No       Consultants:  Orthopedic surgery   Sch Meds:  Scheduled Meds:  acetaminophen  1,000 mg Oral Q8H   amLODipine  5 mg Oral Daily   bethanechol  25 mg Oral TID   Chlorhexidine Gluconate Cloth  6 each Topical Daily   dexamethasone (DECADRON) injection  6 mg Intravenous Q6H   ferrous sulfate  325 mg Oral BID WC   insulin aspart  0-15 Units  Subcutaneous TID WC   insulin aspart  0-5 Units Subcutaneous QHS   insulin aspart  4 Units Subcutaneous TID WC   insulin glargine  20 Units Subcutaneous BID   lisinopril  20 mg Oral Daily   magnesium oxide  400 mg Oral BID   metoprolol tartrate  25 mg Oral BID   polyethylene glycol  17 g Oral BID   rosuvastatin  20 mg Oral Daily   senna-docusate  1 tablet Oral BID   Continuous Infusions: PRN Meds:.HYDROmorphone (DILAUDID) injection, oxyCODONE  Antimicrobials: Anti-infectives (From admission, onward)    None        I have personally reviewed the following labs and images: CBC: Recent Labs  Lab 11/26/20 0921 11/30/20 1805 12/01/20 0445 12/02/20 0213  WBC 6.8 8.3 7.1 7.1  NEUTROABS  --  4.0  --   --   HGB 12.8* 10.6* 10.1* 10.8*  HCT 39.7 31.6* 31.4* 32.8*  MCV 91.1 90.5 92.6 90.1  PLT 272 196 223 258   BMP &GFR Recent Labs  Lab 11/26/20 0921 11/30/20 1805 12/01/20 0445 12/01/20 0517 12/02/20 0213  NA 141 140 138  --  139  K 4.0 3.2* 3.5  --  4.4  CL 106 106 99  --  100  CO2 26 25 29   --  30  GLUCOSE 89 173* 175*  --  185*  BUN 12 19 15   --  14  CREATININE 0.68 0.73 0.76  --  0.71  CALCIUM 9.2 9.2 9.3  --  9.4  MG  --   --  1.7 1.6* 1.9  PHOS  --   --   --   --  3.7   Estimated Creatinine Clearance: 108.3 mL/min (by C-G formula based on SCr of 0.71 mg/dL). Liver & Pancreas: Recent Labs  Lab 11/30/20 1805 12/01/20 0445 12/02/20 0213  AST 30 25  --   ALT 19 19  --   ALKPHOS 42 39  --   BILITOT 0.6 0.8  --   PROT 6.7 6.7  --   ALBUMIN 3.8 3.6 3.5   No results for input(s): LIPASE, AMYLASE in the last 168 hours. No results for input(s): AMMONIA in the last 168 hours. Diabetic: No results for input(s): HGBA1C in the last 72 hours.  Recent Labs  Lab 12/01/20 1140 12/01/20 1545 12/01/20 1959 12/02/20 0650 12/02/20 1112  GLUCAP 159* 210* 225* 164* 186*   Cardiac Enzymes: Recent Labs  Lab 12/02/20 0213  CKTOTAL 362   No results for  input(s): PROBNP in the last 8760 hours. Coagulation Profile: Recent Labs  Lab 11/26/20 0921  INR 1.0   Thyroid Function Tests: Recent Labs    12/01/20 0517  TSH 1.577   Lipid Profile: No results for input(s): CHOL, HDL, LDLCALC, TRIG, CHOLHDL, LDLDIRECT in the last 72 hours. Anemia Panel: Recent Labs    12/02/20 0213  VITAMINB12 <50*  FOLATE 29.4  FERRITIN 63  TIBC 428  IRON 32*  RETICCTPCT 1.5   Urine analysis:    Component Value Date/Time   COLORURINE YELLOW 11/30/2020 0053   APPEARANCEUR CLEAR 11/30/2020 0053   APPEARANCEUR Clear 01/31/2019 0819   LABSPEC 1.019 11/30/2020 0053   PHURINE 5.0 11/30/2020 0053   GLUCOSEU 50 (A) 11/30/2020 0053   HGBUR SMALL (A) 11/30/2020 0053   BILIRUBINUR NEGATIVE 11/30/2020 0053   BILIRUBINUR Negative 01/31/2019 0819   KETONESUR 5 (A) 11/30/2020 0053   PROTEINUR NEGATIVE 11/30/2020 0053   NITRITE NEGATIVE 11/30/2020 0053   LEUKOCYTESUR NEGATIVE 11/30/2020 0053   Sepsis Labs: Invalid input(s): PROCALCITONIN, LACTICIDVEN  Microbiology: Recent Results (from the past 240 hour(s))  Surgical pcr screen     Status: None   Collection Time: 11/26/20  9:19 AM   Specimen: Nasal Mucosa; Nasal Swab  Result Value Ref Range Status   MRSA, PCR NEGATIVE NEGATIVE Final   Staphylococcus aureus NEGATIVE NEGATIVE Final    Comment: (NOTE) The Xpert SA Assay (FDA approved for NASAL specimens in patients 74 years of age and older), is one component of a comprehensive surveillance program. It is not intended to diagnose infection nor to guide or monitor treatment. Performed at [facility], [address] [place], [address] [place]   SARS CORONAVIRUS 2 (TAT 6-24 HRS) Nasopharyngeal Nasopharyngeal Swab     Status: None   Collection Time: 11/26/20  9:20 AM   Specimen: Nasopharyngeal Swab  Result Value Ref Range Status   SARS Coronavirus 2 NEGATIVE NEGATIVE Final    Comment: (NOTE) SARS-CoV-2 target nucleic acids are NOT  DETECTED.  The  SARS-CoV-2 RNA is generally detectable in upper and lower respiratory specimens during the acute phase of infection. Negative results do not preclude SARS-CoV-2 infection, do not rule out co-infections with other pathogens, and should not be used as the sole basis for treatment or other patient management decisions. Negative results must be combined with clinical observations, patient history, and epidemiological information. The expected result is Negative.  Fact Sheet for Patients: HairSlick.no  Fact Sheet for Healthcare Providers: quierodirigir.com  This test is not yet approved or cleared by the [place] FDA and  has been authorized for detection and/or diagnosis of SARS-CoV-2 by FDA under an Emergency Use Authorization (EUA). This EUA will remain  in effect (meaning this test can be used) for the duration of the COVID-19 declaration under Se ction 564(b)(1) of the Act, 21 U.S.C. section 360bbb-3(b)(1), unless the authorization is terminated or revoked sooner.  Performed at [facility], [address], [place], [place] [address]   Resp Panel by RT-PCR (Flu A&B, Covid) Nasopharyngeal Swab     Status: None   Collection Time: 11/30/20 10:17 PM   Specimen: Nasopharyngeal Swab; Nasopharyngeal(NP) swabs in vial transport medium  Result Value Ref Range Status   SARS Coronavirus 2 by RT PCR NEGATIVE NEGATIVE Final    Comment: (NOTE) SARS-CoV-2 target nucleic acids are NOT DETECTED.  The SARS-CoV-2 RNA is generally detectable in upper respiratory specimens during the acute phase of infection. The lowest concentration of SARS-CoV-2 viral copies this assay can detect is 138 copies/mL. A negative result does not preclude SARS-Cov-2 infection and should not be used as the sole basis for treatment or other patient management decisions. A negative result may occur with  improper specimen  collection/handling, submission of specimen other than nasopharyngeal swab, presence of viral mutation(s) within the areas targeted by this assay, and inadequate number of viral copies(<138 copies/mL). A negative result must be combined with clinical observations, patient history, and epidemiological information. The expected result is Negative.  Fact Sheet for Patients:  BloggerCourse.com  Fact Sheet for Healthcare Providers:  SeriousBroker.it  This test is no t yet approved or cleared by the [place] FDA and  has been authorized for detection and/or diagnosis of SARS-CoV-2 by FDA under an Emergency Use Authorization (EUA). This EUA will remain  in effect (meaning this test can be used) for the duration of the COVID-19 declaration under Section 564(b)(1) of the Act, 21 U.S.C.section 360bbb-3(b)(1), unless the authorization is terminated  or revoked sooner.       Influenza A by PCR NEGATIVE NEGATIVE Final   Influenza B by PCR NEGATIVE NEGATIVE Final    Comment: (NOTE) The Xpert Xpress SARS-CoV-2/FLU/RSV plus assay is intended as an aid in the diagnosis of influenza from Nasopharyngeal swab specimens and should not be used as a sole basis for treatment. Nasal washings and aspirates are unacceptable for Xpert Xpress SARS-CoV-2/FLU/RSV testing.  Fact Sheet for Patients: BloggerCourse.com  Fact Sheet for Healthcare Providers: SeriousBroker.it  This test is not yet approved or cleared by the [place] FDA and has been authorized for detection and/or diagnosis of SARS-CoV-2 by FDA under an Emergency Use Authorization (EUA). This EUA will remain in effect (meaning this test can be used) for the duration of the COVID-19 declaration under Section 564(b)(1) of the Act, 21 U.S.C. section 360bbb-3(b)(1), unless the authorization is terminated or revoked.  Performed at [facility], [address], [place], [place] [address]     Radiology Studies: No results found.  Lekita Kerekes T. Mistee Soliman [facility]  If 7PM-7AM, please contact night-coverage [email or web address] 12/02/2020, 1:47 PM

## 2020-12-02 NOTE — Consult Note (Signed)
Chief Complaint: Acute low back and bilateral lower extremity pain status post lumbar decompression History: Tyler Garner is a 65 y.o. male with medical history significant for chronic systolic heart failure with echocardiogram in 2017 showing LVEF 40 to 45%, type 2 diabetes mellitus, degenerative disc disease of the lumbar spine status post recent lumbar decompression, hypertension, hyperlipidemia who is admitted to [facility] on 11/30/2020 with generalized weakness after presenting from home to [facility] ED complaining of such.  Patient complained of urinary retention as well as constipation.  As result of the worsening of his symptoms he was brought back to the emergency room and then ultimately admitted.  Past Medical History:  Diagnosis Date   Arthritis    CAD (coronary artery disease)    a. inferior STEMI (05/2013) with RV involvement- LHC:  dist CFX occluded (Promus premier (2.5x24 mm) DES); EF 40-45% with inf HK;  b. Echo (report pending) with EF 45-50%, lat WMA, RVE, mod RV hypokinesis    Diabetes mellitus without complication (HCC)    Hyperglycemia    Hyperlipidemia    Hypertension    Ischemic cardiomyopathy    Myocardial infarction (HCC) 2015   Nicotine addiction    Renal colic    Right shoulder injury 10/11/2009    Allergies  Allergen Reactions   Percocet [Oxycodone-Acetaminophen] Nausea And Vomiting    No current facility-administered medications on file prior to encounter.   Current Outpatient Medications on File Prior to Encounter  Medication Sig Dispense Refill   acetaminophen (TYLENOL) 650 MG CR tablet Take 650 mg by mouth every 8 (eight) hours as needed for pain.     amLODipine (NORVASC) 5 MG tablet Take 1 tablet (5 mg total) by mouth daily. 90 tablet 1   glimepiride (AMARYL) 4 MG tablet Take 1 tablet (4 mg total) by mouth 2 (two) times daily. 180 tablet 1   glucose blood (ACCU-CHEK ACTIVE STRIPS) test strip Check blood sugar daily and prn  Dx E11.9 100 each  12   insulin glargine (LANTUS SOLOSTAR) 100 UNIT/ML Solostar Pen Inject 60 Units into the skin 2 (two) times daily. 30 mL 3   Insulin Pen Needle (PEN NEEDLES) 31G X 8 MM MISC Use with lantus solosar pen daily 100 each 2   JANUMET 50-1000 MG tablet Take 1 tablet by mouth 2 (two) times daily. 180 tablet 1   lisinopril (ZESTRIL) 20 MG tablet Take 1 tablet (20 mg total) by mouth daily. 90 tablet 1   methocarbamol (ROBAXIN) 500 MG tablet Take 1 tablet (500 mg total) by mouth every 8 (eight) hours as needed for up to 5 days for muscle spasms. 15 tablet 0   metoprolol tartrate (LOPRESSOR) 25 MG tablet Take 1 tablet (25 mg total) by mouth 2 (two) times daily. 180 tablet 1   ondansetron (ZOFRAN) 4 MG tablet Take 1 tablet (4 mg total) by mouth every 8 (eight) hours as needed for nausea or vomiting. 20 tablet 0   oxyCODONE-acetaminophen (PERCOCET) 10-325 MG tablet Take 1 tablet by mouth every 6 (six) hours as needed for up to 5 days for pain. 20 tablet 0   rosuvastatin (CRESTOR) 40 MG tablet Take 0.5 tablets (20 mg total) by mouth daily. 135 tablet 1    Physical Exam: Vitals:   12/01/20 2118 12/02/20 0742  BP: 134/71 (!) 129/59  Pulse: 88 75  Resp: 20 18  Temp: 98.5 F (36.9 C) 98.3 F (36.8 C)  SpO2: 100%    There is no  height or weight on file to calculate BMI. He is alert and oriented x3.  No shortness of breath or chest pain at present.   Abdomen is soft and nontender.  No distention.  Positive recent bowel movement. In the seated position he has intact hip flexor, quad, EHL, gastrocnemius, tibialis anterior function:  4+/5 bilaterally. Negative Babinski test, no clonus, 1+ deep tendon reflexes at the knee absent at the Achilles.  Intact perianal sensation to light touch and pinprick.  He denies any saddle dysesthesias, intact rectal tone. No hip, knee, ankle pain with isolated joint range of motion.  Image: DG Chest 1 View  Result Date: 11/18/2020 CLINICAL DATA:  Pre-op clearance exam  EXAM: CHEST  1 VIEW COMPARISON:  06/21/2013 FINDINGS: The heart size and mediastinal contours are within normal limits. Both lungs are clear. The visualized skeletal structures are unremarkable. IMPRESSION: No active disease. Electronically Signed   By: Danae Orleans M.D.   On: 11/18/2020 10:11   DG Lumbar Spine 2-3 Views  Result Date: 11/28/2020 CLINICAL DATA:  Lumbar decompression. EXAM: LUMBAR SPINE - 2-3 VIEW COMPARISON:  10/27/2019 FINDINGS: Two lateral intraoperative cross-table radiographs of the lumbar spine are provided. The comparison lumbar spine radiographs demonstrate transitional lumbosacral anatomy. The transitional segment is considered a partially sacralized L5 with hypoplastic L5-S1 disc (with this numbering convention confirmed with Dr. Shon Baton). On the first image, 2 needles are in place with 1 projecting between the L2 and L3 spinous processes and the other directed towards the S1 level. On the second image, the tip of a metallic instrument projects over the posterior elements of the lower lumbar spine directed towards the L5 superior endplate/inferior aspect of the L4-5 disc space. IMPRESSION: Intraoperative images as above. These results were called by telephone at the time of interpretation on 11/28/2020 at 8:48 am to Dr. Venita Lick, who verbally acknowledged these results. Electronically Signed   By: Sebastian Ache M.D.   On: 11/28/2020 08:51   DG Abdomen 1 View  Result Date: 11/30/2020 CLINICAL DATA:  Abdominal pain and possible constipation, initial encounter EXAM: ABDOMEN - 1 VIEW COMPARISON:  10/27/2018 FINDINGS: Scattered large and small bowel gas is noted. No definitive free air is seen on this limited exam no definitive obstructive changes seen. Degenerative changes of lumbar spine are noted. IMPRESSION: Limited exam due to patient's inability to lay supine. No obstructive changes are seen. Scattered large and small bowel gas is noted. This may represent a mild postoperative ileus.  Correlate with the physical exam. Electronically Signed   By: Alcide Clever M.D.   On: 11/30/2020 19:45   DG Lumbar Spine 1 View  Result Date: 11/28/2020 CLINICAL DATA:  Intraop localization. EXAM: LUMBAR SPINE - 1 VIEW COMPARISON:  Immediately preceding radiographs. Lumbar radiograph 10/27/2019 also reviewed. FINDINGS: Single portable cross-table lateral view of the lumbar spine obtained in the operating room. Same numbering technique as on prior. Surgical instruments localize posterior to the L5 vertebral body and posterior to L3 vertebral body overlying the pedicle. IMPRESSION: Surgical localization with instruments posterior to the L5 vertebral body and L3 vertebral body/pedicle. Electronically Signed   By: Narda Rutherford M.D.   On: 11/28/2020 15:19    A/P:' Tyler Garner is a very pleasant 65 year old gentleman who underwent a lumbar decompression for spinal stenosis L3-5 on 11/28/2020.  The patient initially did well, and was ultimately discharged home.  Prior to discharge he was voiding spontaneously and ambulating.  According to the patient and his son he started having increasing  pain on Saturday and eventually came to the emergency room.  He noted urinary retention, and significant lower extremity pain and subjective weakness.  The patient was ultimately admitted and a Foley catheter was placed.  Yesterday the patient had a bowel movement and improved abdominal discomfort.  He continues to have significant lower extremity pain.  I did request a stat MRI this morning when I was notified about the patient and unfortunately that has not yet been completed.  But on my exam he does have intact perianal sensation to light touch and pinprick, no saddle dysesthesias, and intact rectal tone.  Clinically in the seated position he has 4+/5 motor strength in the lower extremity bilaterally but when he attempts to stand and ambulate he has significant pain which limits his mobility.  Impression: Patient is status  post lumbar decompression for spinal stenosis with preoperative lower extremity weakness and loss in overall function and quality of life.  Patient had recent decompression and the surgery was uneventful.  Clinical exam is not consistent with a cauda equina syndrome at this time.  MRI with contrast is pending.  Plan on giving him IV Decadron 6 mg IV every 6 for 24 hours.  I will review the MRI to ensure there is no hematoma or significant postoperative issue.  If the MRI does not show any significant issue then I would recommend getting a inpatient rehab consult.  Recommended patient is transferred to [address] for improved post spine surgical care.

## 2020-12-02 NOTE — Progress Notes (Signed)
Dr. Shon Baton notified this morning the patient was readmitted over the weekend with inability to void/empty his bladder completely after surgery in addition to constipation ?post-op ileus? And generalized lower extremity weakness which was patient's baseline prior to surgery given the severity of the stenosis. Recommend stat MRI of lumbar spine with contrast to rule out postop hematoma.  Note dictated by Voncille Lo PA-C, plan discussed with Dr. Shon Baton.

## 2020-12-02 NOTE — Progress Notes (Addendum)
Physical Therapy Treatment Patient Details Name: Tyler Garner MRN: [id number] DOB: 1955/08/16 Today's Date: 12/02/2020    History of Present Illness 65yo male admitted 7/9 with complaints of abdominal discomfort, urinary retention, constipation and LE weakness after recent admision where spinal surgery was performed (s/p L2,L3, and L 5 decompressionon 11/28/20.  He discharged the next day ambulating at supervision level.)  PMH: Obesity, OA, HTN,HF, HLD, CAD, and STEMI.    PT Comments    Pt supine in bed.  Focus of session on standing and standing tolerance based on newly presented weakness in B LEs.  Pt has poor standing tolerance and presents with flexed knees and hips,  Based on his independence at PLOF will recommend aggressive rehab in efforts to improve his mobility before returning home. Will inform supervising PT of need for change in recommendations at this time.     Follow Up Recommendations  Supervision/Assistance - 24 hour;CIR     Equipment Recommendations  None recommended by PT    Recommendations for Other Services       Precautions / Restrictions Precautions Precautions: Fall;Back Precaution Booklet Issued: Yes (comment) Precaution Comments: pt recalls BLT rules, log roll technique OOB Required Braces or Orthoses: Spinal Brace Spinal Brace: Lumbar corset;Applied in sitting position (appears too large for patient) Restrictions Weight Bearing Restrictions: No    Mobility  Bed Mobility Overal bed mobility: Needs Assistance Bed Mobility: Rolling;Sidelying to Sit;Sit to Sidelying Rolling: Min assist Sidelying to sit: Max assist;+2 for physical assistance     Sit to sidelying: Mod assist;+2 for safety/equipment General bed mobility comments: Pt required +2 assistance to mobilize to sitting edge of bed.  He was able to maintain balance in sitting once scooted to edge of bed and feet touching the floor.    Transfers Overall transfer level: Needs  assistance Equipment used: Ambulation equipment used (sara) Transfers: Sit to/from Stand Sit to Stand: Mod assist;+2 physical assistance;Max assist         General transfer comment: Performed in sara stedy due to poor tolerance with RW last session.  Focused on hip and trunk extension.  Pt very flexed on first trial.  During second trial from stedy plates he required decreased assistance.  He remains to tolerate activity poorly due to pain.  Ambulation/Gait Ambulation/Gait assistance:  (unable.)               Stairs             Wheelchair Mobility    Modified Rankin (Stroke Patients Only)       Balance Overall balance assessment: History of Falls;Needs assistance Sitting-balance support: Feet supported;Single extremity supported Sitting balance-Leahy Scale: Fair       Standing balance-Leahy Scale: Poor Standing balance comment: mod-max +2                            Cognition Arousal/Alertness: Awake/alert Behavior During Therapy: WFL for tasks assessed/performed Overall Cognitive Status: Within Functional Limits for tasks assessed                                        Exercises      General Comments        Pertinent Vitals/Pain Pain Assessment: Faces Pain Score: 10-Worst pain ever Pain Location: Reports pain in B LEs in standing and back pain. Pain Descriptors / Indicators: Grimacing;Guarding;Sore;Moaning Pain Intervention(s):  Monitored during session;Repositioned    Home Living                      Prior Function            PT Goals (current goals can now be found in the care plan section) Acute Rehab PT Goals Patient Stated Goal: get pt stronger Potential to Achieve Goals: Good Progress towards PT goals: Progressing toward goals    Frequency    Min 5X/week      PT Plan Discharge plan needs to be updated    Co-evaluation              AM-PAC PT "6 Clicks" Mobility   Outcome  Measure  Help needed turning from your back to your side while in a flat bed without using bedrails?: A Lot Help needed moving from lying on your back to sitting on the side of a flat bed without using bedrails?: A Lot Help needed moving to and from a bed to a chair (including a wheelchair)?: Total Help needed standing up from a chair using your arms (e.g., wheelchair or bedside chair)?: A Lot Help needed to walk in hospital room?: Total Help needed climbing 3-5 steps with a railing? : Total 6 Click Score: 9    End of Session Equipment Utilized During Treatment: Back brace Activity Tolerance: Patient limited by pain;Patient limited by fatigue Patient left: in bed;with call bell/phone within reach;with bed alarm set;with family/visitor present Nurse Communication: Mobility status PT Visit Diagnosis: Other abnormalities of gait and mobility (R26.89);Muscle weakness (generalized) (M62.81);Difficulty in walking, not elsewhere classified (R26.2);Pain Pain - Right/Left: Right Pain - part of body: Leg     Time: 2831-5176 PT Time Calculation (min) (ACUTE ONLY): 18 min  Charges:  $Therapeutic Activity: 8-22 mins                     Bonney Leitz , PTA Acute Rehabilitation Services Pager [phone] Office [phone]    Toyoko Silos Artis Delay 12/02/2020, 3:56 PM

## 2020-12-02 NOTE — Progress Notes (Signed)
Pt's foley removed this AM. Pt unable to void despite being put on [facility] and toilet. Bladder scan showed 525. Pt foley inserted with output.

## 2020-12-02 NOTE — Plan of Care (Signed)

## 2020-12-03 ENCOUNTER — Inpatient Hospital Stay (HOSPITAL_COMMUNITY): Payer: Medicare Other

## 2020-12-03 DIAGNOSIS — M5136 Other intervertebral disc degeneration, lumbar region: Secondary | ICD-10-CM | POA: Diagnosis not present

## 2020-12-03 DIAGNOSIS — I5022 Chronic systolic (congestive) heart failure: Secondary | ICD-10-CM | POA: Diagnosis not present

## 2020-12-03 DIAGNOSIS — R338 Other retention of urine: Secondary | ICD-10-CM | POA: Diagnosis not present

## 2020-12-03 DIAGNOSIS — D519 Vitamin B12 deficiency anemia, unspecified: Secondary | ICD-10-CM

## 2020-12-03 DIAGNOSIS — R531 Weakness: Secondary | ICD-10-CM | POA: Diagnosis not present

## 2020-12-03 LAB — GLUCOSE, CAPILLARY
Glucose-Capillary: 296 mg/dL — ABNORMAL HIGH (ref 70–99)
Glucose-Capillary: 302 mg/dL — ABNORMAL HIGH (ref 70–99)
Glucose-Capillary: 471 mg/dL — ABNORMAL HIGH (ref 70–99)
Glucose-Capillary: 375 mg/dL — ABNORMAL HIGH (ref 70–99)
Glucose-Capillary: 359 mg/dL — ABNORMAL HIGH (ref 70–99)

## 2020-12-03 LAB — GLUCOSE, RANDOM: Glucose, Bld: 430 mg/dL — ABNORMAL HIGH (ref 70–99)

## 2020-12-03 MED ORDER — TAMSULOSIN HCL 0.4 MG PO CAPS
0.4000 mg | ORAL_CAPSULE | Freq: Every day | ORAL | Status: DC
  Administered 2020-12-03 – 2020-12-04 (×2): 0.4 mg via ORAL
  Filled 2020-12-03 (×2): qty 1

## 2020-12-03 MED ORDER — VITAMIN B-12 1000 MCG PO TABS
1000.0000 ug | ORAL_TABLET | Freq: Every day | ORAL | Status: DC
  Administered 2020-12-03 – 2020-12-04 (×2): 1000 ug via ORAL
  Filled 2020-12-03 (×2): qty 1

## 2020-12-03 MED ORDER — INSULIN ASPART 100 UNIT/ML IJ SOLN
0.0000 [IU] | Freq: Three times a day (TID) | INTRAMUSCULAR | Status: DC
  Administered 2020-12-03: 28 [IU] via SUBCUTANEOUS
  Administered 2020-12-04: 7 [IU] via SUBCUTANEOUS
  Administered 2020-12-04: 4 [IU] via SUBCUTANEOUS
  Administered 2020-12-04: 7 [IU] via SUBCUTANEOUS

## 2020-12-03 MED ORDER — MAGNESIUM CITRATE PO SOLN
1.0000 | Freq: Every day | ORAL | Status: DC | PRN
  Administered 2020-12-04: 1 via ORAL
  Filled 2020-12-03: qty 296

## 2020-12-03 MED ORDER — COVID-19 MRNA VACC (MODERNA) 50 MCG/0.25ML IM SUSP
0.2500 mL | Freq: Once | INTRAMUSCULAR | Status: AC
  Administered 2020-12-04: 0.25 mL via INTRAMUSCULAR
  Filled 2020-12-03: qty 0.25

## 2020-12-03 MED ORDER — CYANOCOBALAMIN 1000 MCG/ML IJ SOLN: 1000.0000 ug | INTRAMUSCULAR | Status: DC

## 2020-12-03 MED ORDER — INSULIN ASPART 100 UNIT/ML IJ SOLN
0.0000 [IU] | Freq: Every day | INTRAMUSCULAR | Status: DC
  Administered 2020-12-03: 5 [IU] via SUBCUTANEOUS

## 2020-12-03 MED ORDER — CYANOCOBALAMIN 1000 MCG/ML IJ SOLN
1000.0000 ug | Freq: Every day | INTRAMUSCULAR | Status: DC
  Administered 2020-12-03 – 2020-12-04 (×2): 1000 ug via INTRAMUSCULAR
  Filled 2020-12-03 (×2): qty 1

## 2020-12-03 MED ORDER — ENOXAPARIN SODIUM 40 MG/0.4ML IJ SOSY
40.0000 mg | PREFILLED_SYRINGE | INTRAMUSCULAR | Status: DC
  Administered 2020-12-03: 40 mg via SUBCUTANEOUS
  Filled 2020-12-03: qty 0.4

## 2020-12-03 MED ORDER — DIAZEPAM 5 MG PO TABS
5.0000 mg | ORAL_TABLET | Freq: Three times a day (TID) | ORAL | Status: DC | PRN
  Administered 2020-12-04: 5 mg via ORAL
  Filled 2020-12-03: qty 1

## 2020-12-03 MED ORDER — OXYCODONE HCL ER 15 MG PO T12A
15.0000 mg | EXTENDED_RELEASE_TABLET | Freq: Two times a day (BID) | ORAL | Status: DC
  Administered 2020-12-03 – 2020-12-04 (×3): 15 mg via ORAL
  Filled 2020-12-03 (×3): qty 1

## 2020-12-03 MED ORDER — CYANOCOBALAMIN 1000 MCG/ML IJ SOLN
1000.0000 ug | INTRAMUSCULAR | Status: DC
Start: 2020-12-10 — End: 2020-12-05

## 2020-12-03 MED ORDER — SODIUM CHLORIDE 0.9 % IV SOLN
250.0000 mg | Freq: Every day | INTRAVENOUS | Status: AC
  Administered 2020-12-03 – 2020-12-04 (×2): 250 mg via INTRAVENOUS
  Filled 2020-12-03 (×2): qty 20

## 2020-12-03 NOTE — TOC Progression Note (Addendum)
Transition of Care [facility]) - Progression Note    Patient Details  Name: Tyler Garner MRN: [id number] Date of Birth: 1955/09/07  Transition of Care [facility]) CM/SW Contact  Ralene Bathe, LCSWA Phone Number: 12/03/2020, 10:15 AM  Clinical Narrative:    10:15-  CSW received information during morning rounds that the Orthopedic Physician is requesting that the patient be considered for CIR.  CSW verified that CIR is following.  SNF workup has been completed as a backup, but insurance authorization cannot be started unless CIR is denied.    CSW will continue to follow for any d/c needs.   12:58-  CSW received notification that CIR is unable to accept the patient.  The patient prefers [facility].  CSW called [facility] and spoke with Revonda Standard in admissions and the facility is willing to accept the patient.   13:04-CSW informed the patient that the facility is willing to accept and asked (at the request of the facility) if the patient would be willing to received a COVID booster.  The patient is willing.  13:09-  CSW notified the attending and RN of the patient's request for the COVID booster and requested that the [facility] CMAs begin insurance authorization for this patient.    Expected Discharge Plan: Skilled Nursing Facility Barriers to Discharge: Continued Medical Work up  Expected Discharge Plan and Services Expected Discharge Plan: Skilled Nursing Facility In-house Referral: Clinical Social Work Discharge Planning Services: CM Consult Post Acute Care Choice: Skilled Nursing Facility Living arrangements for the past 2 months: Single Family Home                 DME Arranged: N/A DME Agency: NA       HH Arranged: NA HH Agency: NA         Social Determinants of Health (SDOH) Interventions    Readmission Risk Interventions No flowsheet data found.

## 2020-12-03 NOTE — Progress Notes (Signed)
Physical Therapy Treatment Patient Details Name: Tyler Garner MRN: [id number] DOB: 1956-02-05 Today's Date: 12/03/2020    History of Present Illness 65yo male admitted 7/9 with complaints of abdominal discomfort, urinary retention, constipation and LE weakness after recent admision where spinal surgery was performed (s/p L2,L3, and L 5 decompressionon 11/28/20.  He discharged the next day ambulating at supervision level.)  PMH: Obesity, OA, HTN,HF, HLD, CAD, and STEMI.    PT Comments    Pt supine in bed reports," I don't feel well." By end of session he reports," I'm glad I got up.  I didn't think I could do that."  Pt much improved and progressed to short bout of gt training this am.  Pt continues to benefit from aggressive rehab at CIR to improve strength and function before returning home.    Follow Up Recommendations  CIR;Supervision/Assistance - 24 hour     Equipment Recommendations  None recommended by PT    Recommendations for Other Services       Precautions / Restrictions Precautions Precautions: Fall;Back Precaution Booklet Issued: Yes (comment) Precaution Comments: pt recalls BLT rules, log roll technique OOB Required Braces or Orthoses: Spinal Brace Spinal Brace: Lumbar corset;Applied in sitting position (readjusted brace for improved fit this session.) Restrictions Weight Bearing Restrictions: No    Mobility  Bed Mobility Overal bed mobility: Needs Assistance Bed Mobility: Rolling;Sidelying to Sit Rolling: Min assist Sidelying to sit: Min assist       General bed mobility comments: Pt with decreased assistance to roll to L and rise into sitting.  He performed with decreased time and pain as well.    Transfers Overall transfer level: Needs assistance Equipment used: Rolling walker (2 wheeled) Transfers: Sit to/from Stand Sit to Stand: Min assist;From elevated surface;+2 safety/equipment         General transfer comment: Cues for hand placement to and  from seated surface.  Pt with flexion at hips and trunk but able to correct with cueing.  Ambulation/Gait Ambulation/Gait assistance: Min assist;+2 safety/equipment Gait Distance (Feet): 15 Feet Assistive device: Rolling walker (2 wheeled) Gait Pattern/deviations: Step-through pattern;Decreased step length - right;Decreased step length - left;Shuffle;Trunk flexed;Decreased stride length Gait velocity: slowed   General Gait Details: Cues for head and trunk control as well as B hip extension.  he was much improved this session progressing to 15 ft of gt training.   Stairs             Wheelchair Mobility    Modified Rankin (Stroke Patients Only)       Balance Overall balance assessment: History of Falls;Needs assistance Sitting-balance support: Feet supported;Single extremity supported Sitting balance-Leahy Scale: Fair       Standing balance-Leahy Scale: Poor                              Cognition Arousal/Alertness: Awake/alert Behavior During Therapy: WFL for tasks assessed/performed Overall Cognitive Status: Within Functional Limits for tasks assessed                                        Exercises General Exercises - Lower Extremity Ankle Circles/Pumps: AROM;Both;20 reps;Supine Quad Sets: AROM;Both;15 reps;Supine Long Arc Quad: AROM;Both;15 reps;Seated Hip ABduction/ADduction: AAROM;Both;10 reps;Supine    General Comments        Pertinent Vitals/Pain Pain Assessment: Faces Pain Score: 4  Pain Location: Mild  pain in back and B Legs Pain Descriptors / Indicators: Grimacing;Guarding;Sore Pain Intervention(s): Monitored during session;Repositioned    Home Living                      Prior Function            PT Goals (current goals can now be found in the care plan section) Acute Rehab PT Goals Patient Stated Goal: get pt stronger Potential to Achieve Goals: Good Progress towards PT goals: Progressing toward  goals    Frequency    Min 5X/week      PT Plan Current plan remains appropriate    Co-evaluation              AM-PAC PT "6 Clicks" Mobility   Outcome Measure  Help needed turning from your back to your side while in a flat bed without using bedrails?: A Little Help needed moving from lying on your back to sitting on the side of a flat bed without using bedrails?: A Little Help needed moving to and from a bed to a chair (including a wheelchair)?: A Little Help needed standing up from a chair using your arms (e.g., wheelchair or bedside chair)?: A Little Help needed to walk in hospital room?: A Little Help needed climbing 3-5 steps with a railing? : A Lot 6 Click Score: 17    End of Session Equipment Utilized During Treatment: Back brace;Gait belt Activity Tolerance: Patient limited by pain;Patient limited by fatigue Patient left: in chair;with call bell/phone within reach;with chair alarm set Nurse Communication: Mobility status PT Visit Diagnosis: Other abnormalities of gait and mobility (R26.89);Muscle weakness (generalized) (M62.81);Difficulty in walking, not elsewhere classified (R26.2);Pain Pain - Right/Left: Right Pain - part of body: Leg     Time: 0272-5366 PT Time Calculation (min) (ACUTE ONLY): 17 min  Charges:  $Gait Training: 8-22 mins                     Bonney Leitz , PTA Acute Rehabilitation Services Pager [phone] Office [phone]    Tiffony Kite Artis Delay 12/03/2020, 10:59 AM

## 2020-12-03 NOTE — Progress Notes (Signed)
    Subjective:     Patient reports pain as 4 on 0-10 scale.  Reports unchanged leg pain reports incisional back pain   Negative void Positive bowel movement Positive flatus Negative chest pain or shortness of breath  Objective: Vital signs in last 24 hours: Temp:  [97.7 F (36.5 C)-99 F (37.2 C)] 97.7 F (36.5 C) (07/12 0752) Pulse Rate:  [72-83] 72 (07/12 0752) Resp:  [17-18] 17 (07/12 0752) BP: (121-146)/(63-77) 146/77 (07/12 0752) SpO2:  [97 %-99 %] 99 % (07/12 0752) Weight:  [101.3 kg] 101.3 kg (07/12 0500)  Intake/Output from previous day: 07/11 0701 - 07/12 0700 In: -  Out: 1450 [Urine:1450]  Labs: Recent Labs    12/01/20 0445 12/02/20 0213  WBC 7.1 7.1  RBC 3.39* 3.64*  3.62*  HCT 31.4* 32.8*  PLT 223 258   Recent Labs    12/01/20 0445 12/02/20 0213  NA 138 139  K 3.5 4.4  CL 99 100  CO2 29 30  BUN 15 14  CREATININE 0.76 0.71  GLUCOSE 175* 185*  CALCIUM 9.3 9.4   No results for input(s): LABPT, INR in the last 72 hours.  Physical Exam: ABD soft Sensation intact distally Intact pulses distally Dorsiflexion/Plantar flexion intact Incision: dressing C/D/I and no drainage Compartment soft Body mass index is 32.98 kg/m. Rectal: intact perianal sensation.  Intact rectal tone 5/5 motor strength in the LE (tested in supine position) Sensation to light touch intact in the LE Negative st leg raise  Assessment/Plan: Patient stable  MRI: left lateral disc is unchanged in size from pre-op MRI.  No significant stenosis at the cranial level of the decompression.  Post op fluid collection noted - no critical recurrent stenosis at operative site (L3-5).  NO evidence of CSF leak At this point there is no clinical evidence of cauda equina or critical stenosis that would require additional surgery. Patient with uncontrolled pain after discharge as well as urinary retention. 1 pain control: will adjust oral meds to better control pain.This will also allow  better mobilization. 2. Keep foley in place and will set up outpatient Urology appointment. 3. Start flomax for improved urinary function. 4. Question need for telemetry - will defer to Dr Alanda Slim as to its necessity. 5. Await input concerning CIR evaluation.  If he is not a candidate for CIR then will move forward with SNF 6. Continue mobilization with physical therapy   Venita Lick, MD [facility] [phone]

## 2020-12-03 NOTE — Progress Notes (Signed)
Inpatient Rehabilitation Admissions Coordinator   I met at bedside with patient and a family member. I explained that it is unlikely that his [place] will approve a CIR/inpt rehab admit nor do I have a bed available this week. Therefore I will not pursue CIR admit. Patient would like to pursue SNF at [place]. I will alert acute team and TOC. We will sign off at this time.  Danne Baxter, RN, MSN Rehab Admissions Coordinator [phone] 12/03/2020 12:38 PM

## 2020-12-03 NOTE — Progress Notes (Signed)
PROGRESS NOTE  Tyler Garner TDD:[id number] DOB: [birth year]   PCP: Bennie Pierini, [name]  Patient is from: Home  DOA: 11/30/2020 LOS: 3  Chief complaints:  Generalized weakness    Brief Narrative / Interim history: 65 year old M with PMH of systolic CHF, DM-2, HTN, HLD and lumbar DDD s/p recent lumbar decompression on 7/7 by Dr. Shon Baton of [facility] returning to ED that day after discharge with generalized weakness, and admitted with postop ileus, generalized weakness and urinary retention.  He had 600 cc UOP upon Foley insertion.  Patient was on Percocet 10/325 mg every 6 hours as needed pain after surgery.  He has not had a bowel movement since surgery.  He was not on bowel regimen.  On call orthopedic surgery, Dr. Victorino Dike consulted but did not feel there is need to orthopedic intervention.  Orthopedic surgery, Dr. Shon Baton evaluated patient, started Decadron and ordered lumbar MRI which did not show critical stenosis.  Decadron discontinued.  Pain meds adjusted.   Patient failed voiding trial on 7/11.  Foley reinserted.  Renal ultrasound negative.  Flomax added.  He is already on bethanechol.  Therapy recommended SNF.  Subjective: Seen and examined earlier this morning.  No major events overnight of this morning.  No complaints.  No back pain lying down in bed.  No numbness, tingling or weakness in his legs.  He failed voiding trial yesterday.  Has not had a bowel movement yesterday or today.  Passing gas.  No GI symptoms.  Objective: Vitals:   12/03/20 0500 12/03/20 0752 12/03/20 1325 12/03/20 1605  BP:  (!) 146/77 114/68 (!) 105/56  Pulse:  72 76 71  Resp:  17  17  Temp:  97.7 F (36.5 C)  98.2 F (36.8 C)  TempSrc:  Oral  Oral  SpO2:  99%  100%  Weight: 101.3 kg       Intake/Output Summary (Last 24 hours) at 12/03/2020 1630 Last data filed at 12/03/2020 1523 Gross per 24 hour  Intake 2.18 ml  Output 1350 ml  Net -1347.82 ml   Filed Weights   12/03/20 0500   Weight: 101.3 kg    Examination:  GENERAL: No apparent distress.  Nontoxic. HEENT: MMM.  Vision and hearing grossly intact.  NECK: Supple.  No apparent JVD.  RESP: On RA.  No IWOB.  Fair aeration bilaterally. CVS:  RRR. Heart sounds normal.  ABD/GI/GU: BS+. Abd soft, NTND.  Indwelling Foley. MSK/EXT:  Moves extremities. No apparent deformity. No edema.  SKIN: Dressing over surgical wound DCI. NEURO: Awake and alert. Oriented appropriately.  No apparent focal neuro deficit. PSYCH: Calm. Normal affect.   Procedures:  None  Microbiology summarized: COVID-19 and influenza PCR nonreactive.  Assessment & Plan: Generalized weakness-likely related to recent surgery, opiate and ileus.  No cardiopulmonary symptoms.  Appears euvolemic but difficult exam due to body habitus.  Low suspicion for infectious process.  TSH within normal.  He is anemia is not bad enough to cause his symptoms. -PT/OT-recommended SNF.  Lumbar DDD/spinal canal stenosis s/p decompression on 11/28/2020 by Dr. Shon Baton at [facility].  Has honeycomb dressing over surgical site that appears DCI.  Has urinary tension likely from opiates, ileus and constipation versus cauda equina syndrome.  MRI and neuro exam reassuring. -Decadron discontinued. -Pain control per orthopedic surgery-on OxyContin, oxycodone, Valium and Tylenol -Continue brace, bowel regimen -Continue PT/OT  Postoperative ileus-likely due to pain meds.  Seems to have resolved.  Had a bowel movement on 7/2.  No nausea or vomiting.  Abdominal exam benign. -Scheduled Senokot-S with as needed MiraLAX and mag citrate based on severity  Acute urinary retention: Likely due to opiates, ileus, constipation and possibly BPH.  Renal US and function reassuring.  Failed voiding trial on 7/11. -Continue bethanechol.  Added Flomax -Will attempt voiding trial in the next 2 to 3 days -If he fails next voiding trial, he needs outpatient urology follow-up  Chronic systolic  CHF: "Echo (report pending) with EF 45-50%, lat WMA, RVE, mod RV hypokinesis".  He does not seem to be on diuretics.  No cardiopulmonary symptoms.  Appears euvolemic but difficult exam due to body habitus. -Monitor fluid and respiratory status.  History of inferior STEMI  s/p DES to distal CFX-patient without chest pain or respiratory symptoms. -Continue home metoprolol and lisinopril  Controlled IDDM-2 with hyperlipidemia and hyperglycemia: A1c 6.9%.  On Lantus 60 units twice daily, Amaryl, Janumet and Crestor.  Hyperglycemia likely due to steroid.  Expect improvement now he is off steroid. Recent Labs  Lab 12/02/20 1612 12/02/20 1953 12/03/20 0624 12/03/20 1108 12/03/20 1615  GLUCAP 121* 343* 296* 302* 471*  -Continue Lantus 30 units twice daily for now. -Continue NovoLog 8 units 3 times daily with meals -Continue SSI-moderate -Continue Crestor.  Essential hypertension: Normotensive. -Cardiac meds as above  Hypokalemia/hypomagnesemia: Resolved.  Iron deficiency anemia: H&H stable.  Anemia panel consistent with iron deficiency. Severe vitamin B12 deficiency: Vitamin B12 50. Recent Labs    02/22/20 1023 05/28/20 0905 08/13/20 0836 11/18/20 0929 11/26/20 0921 11/30/20 1805 12/01/20 0445 12/02/20 0213  HGB 13.9 13.4 13.0 12.8* 12.8* 10.6* 10.1* 10.8*  -Vitamin B12 1000 mcg IM daily for 1 week followed by weekly for 1 months then monthly -IV ferric gluconate 240 mg x 2 -P.o. ferrous sulfate and p.o. vitamin B12 -Continue monitoring   Class I obesity Body mass index is 32.98 kg/m.         DVT prophylaxis:  enoxaparin (LOVENOX) injection 40 mg Start: 12/03/20 1730 SCDs Start: 11/30/20 2214  Code Status: Full code Family Communication: Patient and/or RN. Available if any question.  Level of care: Med-Surg Status is: Inpatient  Remains inpatient appropriate because:Unsafe d/c plan  Dispo: The patient is from: Home              Anticipated d/c is to:  SNF               Patient currently is medically stable to d/c.   Difficult to place patient No       Consultants:  Orthopedic surgery   Sch Meds:  Scheduled Meds:  acetaminophen  1,000 mg Oral Q8H   amLODipine  5 mg Oral Daily   bethanechol  25 mg Oral TID   Chlorhexidine Gluconate Cloth  6 each Topical Daily   [START ON 12/04/2020] COVID-19 mRNA vaccine (Moderna)  0.25 mL Intramuscular Once   cyanocobalamin  1,000 mcg Intramuscular Daily   Followed by   Melene Muller ON 12/10/2020] cyanocobalamin  1,000 mcg Intramuscular Weekly   Followed by   Melene Muller ON 01/07/2021] cyanocobalamin  1,000 mcg Intramuscular Q30 days   enoxaparin (LOVENOX) injection  40 mg Subcutaneous Q24H   ferrous sulfate  325 mg Oral BID WC   insulin aspart  0-15 Units Subcutaneous TID WC   insulin aspart  0-5 Units Subcutaneous QHS   insulin aspart  8 Units Subcutaneous TID WC   insulin glargine  30 Units Subcutaneous BID   lisinopril  20 mg Oral Daily   magnesium oxide  400 mg  Oral BID   metoprolol tartrate  25 mg Oral BID   oxyCODONE  15 mg Oral Q12H   polyethylene glycol  17 g Oral BID   rosuvastatin  20 mg Oral Daily   senna-docusate  1 tablet Oral BID   tamsulosin  0.4 mg Oral Daily   vitamin B-12  1,000 mcg Oral Daily   Continuous Infusions:  ferric gluconate (FERRLECIT) IVPB 250 mg (12/03/20 1022)   PRN Meds:.diazepam, magnesium citrate, oxyCODONE  Antimicrobials: Anti-infectives (From admission, onward)    None        I have personally reviewed the following labs and images: CBC: Recent Labs  Lab 11/30/20 1805 12/01/20 0445 12/02/20 0213  WBC 8.3 7.1 7.1  NEUTROABS 4.0  --   --   HGB 10.6* 10.1* 10.8*  HCT 31.6* 31.4* 32.8*  MCV 90.5 92.6 90.1  PLT 196 223 258   BMP &GFR Recent Labs  Lab 11/30/20 1805 12/01/20 0445 12/01/20 0517 12/02/20 0213  NA 140 138  --  139  K 3.2* 3.5  --  4.4  CL 106 99  --  100  CO2 25 29  --  30  GLUCOSE 173* 175*  --  185*  BUN 19 15  --  14   CREATININE 0.73 0.76  --  0.71  CALCIUM 9.2 9.3  --  9.4  MG  --  1.7 1.6* 1.9  PHOS  --   --   --  3.7   Estimated Creatinine Clearance: 106.5 mL/min (by C-G formula based on SCr of 0.71 mg/dL). Liver & Pancreas: Recent Labs  Lab 11/30/20 1805 12/01/20 0445 12/02/20 0213  AST 30 25  --   ALT 19 19  --   ALKPHOS 42 39  --   BILITOT 0.6 0.8  --   PROT 6.7 6.7  --   ALBUMIN 3.8 3.6 3.5   No results for input(s): LIPASE, AMYLASE in the last 168 hours. No results for input(s): AMMONIA in the last 168 hours. Diabetic: No results for input(s): HGBA1C in the last 72 hours.  Recent Labs  Lab 12/02/20 1612 12/02/20 1953 12/03/20 0624 12/03/20 1108 12/03/20 1615  GLUCAP 121* 343* 296* 302* 471*   Cardiac Enzymes: Recent Labs  Lab 12/02/20 0213  CKTOTAL 362   No results for input(s): PROBNP in the last 8760 hours. Coagulation Profile: No results for input(s): INR, PROTIME in the last 168 hours.  Thyroid Function Tests: Recent Labs    12/01/20 0517  TSH 1.577   Lipid Profile: No results for input(s): CHOL, HDL, LDLCALC, TRIG, CHOLHDL, LDLDIRECT in the last 72 hours. Anemia Panel: Recent Labs    12/02/20 0213  VITAMINB12 <50*  FOLATE 29.4  FERRITIN 63  TIBC 428  IRON 32*  RETICCTPCT 1.5   Urine analysis:    Component Value Date/Time   COLORURINE YELLOW 11/30/2020 0053   APPEARANCEUR CLEAR 11/30/2020 0053   APPEARANCEUR Clear 01/31/2019 0819   LABSPEC 1.019 11/30/2020 0053   PHURINE 5.0 11/30/2020 0053   GLUCOSEU 50 (A) 11/30/2020 0053   HGBUR SMALL (A) 11/30/2020 0053   BILIRUBINUR NEGATIVE 11/30/2020 0053   BILIRUBINUR Negative 01/31/2019 0819   KETONESUR 5 (A) 11/30/2020 0053   PROTEINUR NEGATIVE 11/30/2020 0053   NITRITE NEGATIVE 11/30/2020 0053   LEUKOCYTESUR NEGATIVE 11/30/2020 0053   Sepsis Labs: Invalid input(s): PROCALCITONIN, LACTICIDVEN  Microbiology: Recent Results (from the past 240 hour(s))  Surgical pcr screen     Status: None    Collection Time: 11/26/20  9:19 AM   Specimen: Nasal Mucosa; Nasal Swab  Result Value Ref Range Status   MRSA, PCR NEGATIVE NEGATIVE Final   Staphylococcus aureus NEGATIVE NEGATIVE Final    Comment: (NOTE) The Xpert SA Assay (FDA approved for NASAL specimens in patients 66 years of age and older), is one component of a comprehensive surveillance program. It is not intended to diagnose infection nor to guide or monitor treatment. Performed at [facility], [address] [place], [place] [address]   SARS CORONAVIRUS 2 (TAT 6-24 HRS) Nasopharyngeal Nasopharyngeal Swab     Status: None   Collection Time: 11/26/20  9:20 AM   Specimen: Nasopharyngeal Swab  Result Value Ref Range Status   SARS Coronavirus 2 NEGATIVE NEGATIVE Final    Comment: (NOTE) SARS-CoV-2 target nucleic acids are NOT DETECTED.  The SARS-CoV-2 RNA is generally detectable in upper and lower respiratory specimens during the acute phase of infection. Negative results do not preclude SARS-CoV-2 infection, do not rule out co-infections with other pathogens, and should not be used as the sole basis for treatment or other patient management decisions. Negative results must be combined with clinical observations, patient history, and epidemiological information. The expected result is Negative.  Fact Sheet for Patients: HairSlick.nohttps://www.fda.gov/media/138098/download  Fact Sheet for Healthcare Providers: quierodirigir.comhttps://www.fda.gov/media/138095/download  This test is not yet approved or cleared by the [place] FDA and  has been authorized for detection and/or diagnosis of SARS-CoV-2 by FDA under an Emergency Use Authorization (EUA). This EUA will remain  in effect (meaning this test can be used) for the duration of the COVID-19 declaration under Se ction 564(b)(1) of the Act, 21 U.S.C. section 360bbb-3(b)(1), unless the authorization is terminated or revoked sooner.  Performed at [facility], [address] [place], [place] [address]   Resp Panel by RT-PCR (Flu A&B, Covid) Nasopharyngeal Swab     Status: None   Collection Time: 11/30/20 10:17 PM   Specimen: Nasopharyngeal Swab; Nasopharyngeal(NP) swabs in vial transport medium  Result Value Ref Range Status   SARS Coronavirus 2 by RT PCR NEGATIVE NEGATIVE Final    Comment: (NOTE) SARS-CoV-2 target nucleic acids are NOT DETECTED.  The SARS-CoV-2 RNA is generally detectable in upper respiratory specimens during the acute phase of infection. The lowest concentration of SARS-CoV-2 viral copies this assay can detect is 138 copies/mL. A negative result does not preclude SARS-Cov-2 infection and should not be used as the sole basis for treatment or other patient management decisions. A negative result may occur with  improper specimen collection/handling, submission of specimen other than nasopharyngeal swab, presence of viral mutation(s) within the areas targeted by this assay, and inadequate number of viral copies(<138 copies/mL). A negative result must be combined with clinical observations, patient history, and epidemiological information. The expected result is Negative.  Fact Sheet for Patients:  BloggerCourse.comhttps://www.fda.gov/media/152166/download  Fact Sheet for Healthcare Providers:  SeriousBroker.ithttps://www.fda.gov/media/152162/download  This test is no t yet approved or cleared by the [place] FDA and  has been authorized for detection and/or diagnosis of SARS-CoV-2 by FDA under an Emergency Use Authorization (EUA). This EUA will remain  in effect (meaning this test can be used) for the duration of the COVID-19 declaration under Section 564(b)(1) of the Act, 21 U.S.C.section 360bbb-3(b)(1), unless the authorization is terminated  or revoked sooner.       Influenza A by PCR NEGATIVE NEGATIVE Final   Influenza B by PCR NEGATIVE NEGATIVE Final    Comment: (NOTE) The Xpert Xpress SARS-CoV-2/FLU/RSV  plus assay is intended as an aid in the  diagnosis of influenza from Nasopharyngeal swab specimens and should not be used as a sole basis for treatment. Nasal washings and aspirates are unacceptable for Xpert Xpress SARS-CoV-2/FLU/RSV testing.  Fact Sheet for Patients: BloggerCourse.com  Fact Sheet for Healthcare Providers: SeriousBroker.it  This test is not yet approved or cleared by the [place] FDA and has been authorized for detection and/or diagnosis of SARS-CoV-2 by FDA under an Emergency Use Authorization (EUA). This EUA will remain in effect (meaning this test can be used) for the duration of the COVID-19 declaration under Section 564(b)(1) of the Act, 21 U.S.C. section 360bbb-3(b)(1), unless the authorization is terminated or revoked.  Performed at [facility], [address], [place], [place] [address]     Radiology Studies: US RENAL  Result Date: 12/03/2020 CLINICAL DATA:  Acute urinary retention. EXAM: RENAL / URINARY TRACT ULTRASOUND COMPLETE COMPARISON:  None. FINDINGS: Right Kidney: Renal measurements: 13.4 x 6.6 x 5.6 cm = volume: 259.0 mL. Echogenicity within normal limits. No mass or hydronephrosis visualized. Left Kidney: Renal measurements: 14.1 x 6.2 x 5.7 cm = volume: 260.6 mL. Echogenicity within normal limits. No mass or hydronephrosis visualized. Bladder: Almost completely decompressed with a Foley catheter in place. Other: None. IMPRESSION: Normal appearing kidneys. The urinary bladder is almost completely decompressed with a Foley catheter in place. Electronically Signed   By: Drusilla Kanner M.D.   On: 12/03/2020 14:33      Chrisie Jankovich T. Graylen [place] [facility]  If 7PM-7AM, please contact night-coverage [email or web address] 12/03/2020, 4:30 PM

## 2020-12-03 NOTE — Progress Notes (Signed)
Inpatient Diabetes Program Recommendations  AACE/ADA: New Consensus Statement on Inpatient Glycemic Control (2015)  Target Ranges:  Prepandial:   less than 140 mg/dL      Peak postprandial:   less than 180 mg/dL (1-2 hours)      Critically ill patients:  140 - 180 mg/dL   Lab Results  Component Value Date   GLUCAP 296 (H) 12/03/2020   HGBA1C 6.9 (H) 11/28/2020    Review of Glycemic Control Results for Tyler Garner, Tyler Garner (MRN [id number]) as of 12/03/2020 10:09  Ref. Range 12/02/2020 06:50 12/02/2020 11:12 12/02/2020 16:12 12/02/2020 19:53 12/03/2020 06:24  Glucose-Capillary Latest Ref Range: 70 - 99 mg/dL 277 (H) 824 (H) 235 (H) 343 (H) 296 (H)   Diabetes history: DM 2 Outpatient Diabetes medications:  Current orders for Inpatient glycemic control:  Lantus 30 units bid Novolog 0-15 units tid + hs Novolog 8 units tid meal coverage  Inpatient Diabetes Program Recommendations:    -  If decadron d/c'd consider reducing Lantus dose back down to 22 units bid starting tomorrow 7/13.  Thanks,  Christena Deem RN, MSN, BC-ADM Inpatient Diabetes Coordinator Team Pager [phone] (8a-5p)

## 2020-12-03 NOTE — Plan of Care (Signed)
  Problem: Health Behavior/Discharge Planning: Goal: Ability to manage health-related needs will improve Outcome: Progressing   Problem: Activity: Goal: Risk for activity intolerance will decrease Outcome: Progressing   Problem: Elimination: Goal: Will not experience complications related to urinary retention Outcome: Not Progressing   Problem: Pain Managment: Goal: General experience of comfort will improve Outcome: Progressing

## 2020-12-04 DIAGNOSIS — Z8679 Personal history of other diseases of the circulatory system: Secondary | ICD-10-CM | POA: Diagnosis not present

## 2020-12-04 DIAGNOSIS — E1165 Type 2 diabetes mellitus with hyperglycemia: Secondary | ICD-10-CM | POA: Diagnosis not present

## 2020-12-04 DIAGNOSIS — N390 Urinary tract infection, site not specified: Secondary | ICD-10-CM | POA: Diagnosis not present

## 2020-12-04 DIAGNOSIS — M5136 Other intervertebral disc degeneration, lumbar region: Secondary | ICD-10-CM | POA: Diagnosis not present

## 2020-12-04 DIAGNOSIS — G9341 Metabolic encephalopathy: Secondary | ICD-10-CM | POA: Diagnosis not present

## 2020-12-04 DIAGNOSIS — R531 Weakness: Secondary | ICD-10-CM | POA: Diagnosis not present

## 2020-12-04 DIAGNOSIS — R339 Retention of urine, unspecified: Secondary | ICD-10-CM | POA: Diagnosis not present

## 2020-12-04 DIAGNOSIS — R5381 Other malaise: Secondary | ICD-10-CM | POA: Diagnosis not present

## 2020-12-04 DIAGNOSIS — M4800 Spinal stenosis, site unspecified: Secondary | ICD-10-CM | POA: Diagnosis not present

## 2020-12-04 DIAGNOSIS — Z794 Long term (current) use of insulin: Secondary | ICD-10-CM | POA: Diagnosis not present

## 2020-12-04 DIAGNOSIS — K9189 Other postprocedural complications and disorders of digestive system: Secondary | ICD-10-CM | POA: Diagnosis not present

## 2020-12-04 DIAGNOSIS — I10 Essential (primary) hypertension: Secondary | ICD-10-CM | POA: Diagnosis not present

## 2020-12-04 DIAGNOSIS — I252 Old myocardial infarction: Secondary | ICD-10-CM | POA: Diagnosis not present

## 2020-12-04 DIAGNOSIS — D510 Vitamin B12 deficiency anemia due to intrinsic factor deficiency: Secondary | ICD-10-CM | POA: Diagnosis not present

## 2020-12-04 DIAGNOSIS — A419 Sepsis, unspecified organism: Secondary | ICD-10-CM | POA: Diagnosis not present

## 2020-12-04 DIAGNOSIS — G8911 Acute pain due to trauma: Secondary | ICD-10-CM | POA: Diagnosis not present

## 2020-12-04 DIAGNOSIS — I255 Ischemic cardiomyopathy: Secondary | ICD-10-CM | POA: Diagnosis not present

## 2020-12-04 DIAGNOSIS — Z7984 Long term (current) use of oral hypoglycemic drugs: Secondary | ICD-10-CM | POA: Diagnosis not present

## 2020-12-04 DIAGNOSIS — D519 Vitamin B12 deficiency anemia, unspecified: Secondary | ICD-10-CM | POA: Diagnosis not present

## 2020-12-04 DIAGNOSIS — E7800 Pure hypercholesterolemia, unspecified: Secondary | ICD-10-CM | POA: Diagnosis not present

## 2020-12-04 DIAGNOSIS — T83511A Infection and inflammatory reaction due to indwelling urethral catheter, initial encounter: Secondary | ICD-10-CM | POA: Diagnosis not present

## 2020-12-04 DIAGNOSIS — D509 Iron deficiency anemia, unspecified: Secondary | ICD-10-CM

## 2020-12-04 DIAGNOSIS — E119 Type 2 diabetes mellitus without complications: Secondary | ICD-10-CM | POA: Diagnosis not present

## 2020-12-04 DIAGNOSIS — K567 Ileus, unspecified: Secondary | ICD-10-CM | POA: Diagnosis not present

## 2020-12-04 DIAGNOSIS — N179 Acute kidney failure, unspecified: Secondary | ICD-10-CM | POA: Diagnosis not present

## 2020-12-04 DIAGNOSIS — Z20822 Contact with and (suspected) exposure to covid-19: Secondary | ICD-10-CM | POA: Diagnosis not present

## 2020-12-04 DIAGNOSIS — Z4782 Encounter for orthopedic aftercare following scoliosis surgery: Secondary | ICD-10-CM | POA: Diagnosis not present

## 2020-12-04 DIAGNOSIS — R404 Transient alteration of awareness: Secondary | ICD-10-CM | POA: Diagnosis not present

## 2020-12-04 DIAGNOSIS — R338 Other retention of urine: Secondary | ICD-10-CM | POA: Diagnosis not present

## 2020-12-04 DIAGNOSIS — N319 Neuromuscular dysfunction of bladder, unspecified: Secondary | ICD-10-CM | POA: Diagnosis not present

## 2020-12-04 DIAGNOSIS — Z743 Need for continuous supervision: Secondary | ICD-10-CM | POA: Diagnosis not present

## 2020-12-04 DIAGNOSIS — R262 Difficulty in walking, not elsewhere classified: Secondary | ICD-10-CM | POA: Diagnosis not present

## 2020-12-04 DIAGNOSIS — Z7401 Bed confinement status: Secondary | ICD-10-CM | POA: Diagnosis not present

## 2020-12-04 DIAGNOSIS — E785 Hyperlipidemia, unspecified: Secondary | ICD-10-CM | POA: Diagnosis not present

## 2020-12-04 DIAGNOSIS — Z9119 Patient's noncompliance with other medical treatment and regimen: Secondary | ICD-10-CM | POA: Diagnosis not present

## 2020-12-04 DIAGNOSIS — R410 Disorientation, unspecified: Secondary | ICD-10-CM | POA: Diagnosis not present

## 2020-12-04 DIAGNOSIS — I5022 Chronic systolic (congestive) heart failure: Secondary | ICD-10-CM | POA: Diagnosis not present

## 2020-12-04 DIAGNOSIS — M6281 Muscle weakness (generalized): Secondary | ICD-10-CM | POA: Diagnosis not present

## 2020-12-04 DIAGNOSIS — Z79899 Other long term (current) drug therapy: Secondary | ICD-10-CM | POA: Diagnosis not present

## 2020-12-04 DIAGNOSIS — M549 Dorsalgia, unspecified: Secondary | ICD-10-CM | POA: Diagnosis not present

## 2020-12-04 DIAGNOSIS — E669 Obesity, unspecified: Secondary | ICD-10-CM

## 2020-12-04 LAB — GLUCOSE, CAPILLARY
Glucose-Capillary: 212 mg/dL — ABNORMAL HIGH (ref 70–99)
Glucose-Capillary: 204 mg/dL — ABNORMAL HIGH (ref 70–99)
Glucose-Capillary: 198 mg/dL — ABNORMAL HIGH (ref 70–99)

## 2020-12-04 LAB — RESP PANEL BY RT-PCR (FLU A&B, COVID) ARPGX2
Influenza A by PCR: NEGATIVE
Influenza B by PCR: NEGATIVE
SARS Coronavirus 2 by RT PCR: NEGATIVE

## 2020-12-04 MED ORDER — TAMSULOSIN HCL 0.4 MG PO CAPS
0.4000 mg | ORAL_CAPSULE | Freq: Every day | ORAL | Status: DC
Start: 2020-12-05 — End: 2021-02-18

## 2020-12-04 MED ORDER — ACETAMINOPHEN 500 MG PO TABS: 1000.0000 mg | ORAL_TABLET | Freq: Three times a day (TID) | ORAL | 0 refills | Status: DC

## 2020-12-04 MED ORDER — BETHANECHOL CHLORIDE 25 MG PO TABS: 25.0000 mg | ORAL_TABLET | Freq: Three times a day (TID) | ORAL | 0 refills | Status: AC

## 2020-12-04 MED ORDER — FERROUS SULFATE 325 (65 FE) MG PO TABS: 325.0000 mg | ORAL_TABLET | Freq: Two times a day (BID) | ORAL | 3 refills | Status: DC

## 2020-12-04 MED ORDER — SENNA 8.6 MG PO TABS: ORAL_TABLET | ORAL | 0 refills | Status: AC

## 2020-12-04 MED ORDER — CYANOCOBALAMIN 1000 MCG/ML IJ SOLN: INTRAMUSCULAR | 0 refills | Status: DC

## 2020-12-04 MED ORDER — OXYCODONE HCL 5 MG PO TABS: 5.0000 mg | ORAL_TABLET | Freq: Four times a day (QID) | ORAL | 0 refills | Status: AC | PRN

## 2020-12-04 MED ORDER — POLYETHYLENE GLYCOL 3350 17 G PO PACK
17.0000 g | PACK | Freq: Two times a day (BID) | ORAL | 0 refills | Status: DC | PRN
Start: 2020-12-04 — End: 2021-03-25

## 2020-12-04 MED ORDER — METHOCARBAMOL 500 MG PO TABS: 500.0000 mg | ORAL_TABLET | Freq: Three times a day (TID) | ORAL | 0 refills | Status: AC | PRN

## 2020-12-04 MED ORDER — ACETAMINOPHEN 500 MG PO TABS: ORAL_TABLET | ORAL | 0 refills | Status: DC

## 2020-12-04 MED ORDER — MAGNESIUM CITRATE PO SOLN
1.0000 | Freq: Every day | ORAL | Status: DC | PRN
Start: 2020-12-04 — End: 2021-01-01

## 2020-12-04 NOTE — TOC Transition Note (Signed)
Transition of Care [facility]) - CM/SW Discharge Note   Patient Details  Name: Tyler Garner MRN: [id number] Date of Birth: November 28, 1955  [facility] of [facility]) CM/SW Contact:  Ralene Bathe, LCSWA Phone Number: 12/04/2020, 12:48 PM   Clinical Narrative:     Patient will DC to: [facility] Anticipated DC date: 12/04/2020 Transport by: Sharin Mons   Per MD patient ready for DC to SNF. RN to call report prior to discharge [phone] . RN, patient, and facility notified of DC. Discharge Summary and FL2 sent to facility. DC packet on chart. Ambulance transport will be requested for patient when RN reports that patient is ready.   CSW will sign off for now as social work intervention is no longer needed. Please consult [place] again if new needs arise.    Final next level of care: Skilled Nursing Facility Barriers to Discharge: No Barriers Identified   Patient Goals and CMS Choice Patient states their goals for this hospitalization and ongoing recovery are:: skilled rehab then home CMS Medicare.gov Compare Post Acute Care list provided to:: Patient Choice offered to / list presented to : Patient  Discharge Placement              Patient chooses bed at: [facility] Patient to be transferred to facility by: [facility] Name of family member notified: Patient alert Patient and family notified of of transfer: 12/04/20  Discharge Plan and Services In-house Referral: Clinical Social Work Discharge Planning Services: [employer] Consult Post Acute Care Choice: Skilled Nursing Facility          DME Arranged: N/A DME Agency: NA       HH Arranged: NA HH Agency: NA        Social Determinants of Health (SDOH) Interventions     Readmission Risk Interventions No flowsheet data found.

## 2020-12-04 NOTE — Progress Notes (Signed)
Subjective:    Patient reports pain as mild.   Leg pain and strength subjectively improved today. Has been up and walking with PT using rolling walker +foley +BM  Objective: Vital signs in last 24 hours: Temp:  [98 F (36.7 C)-98.2 F (36.8 C)] 98 F (36.7 C) (07/13 0721) Pulse Rate:  [70-77] 70 (07/13 0721) Resp:  [17-18] 17 (07/13 0721) BP: (105-136)/(42-68) 109/66 (07/13 0721) SpO2:  [94 %-100 %] 94 % (07/13 0721)  Intake/Output from previous day: 07/12 0701 - 07/13 0700 In: 2.2 [IV Piggyback:2.2] Out: 2300 [Urine:2300] Intake/Output this shift: No intake/output data recorded.  Recent Labs    12/02/20 0213  HGB 10.8*   Recent Labs    12/02/20 0213  WBC 7.1  RBC 3.64*  3.62*  HCT 32.8*  PLT 258   Recent Labs    12/02/20 0213 12/03/20 1642  NA 139  --   K 4.4  --   CL 100  --   CO2 30  --   BUN 14  --   CREATININE 0.71  --   GLUCOSE 185* 430*  CALCIUM 9.4  --    No results for input(s): LABPT, INR in the last 72 hours.  ABD soft Sensation intact distally Intact pulses distally Dorsiflexion/Plantar flexion intact. 5/5 motor strength with bilateral plantar flexion and dorsiflexion. Knee flexion and extension in tact as well. Negative st leg raise Incision: dressing C/D/I and no drainage. Bottom edge of dressing curling up Compartment soft Body mass index is 32.98 kg/m.    Assessment/Plan:  Patient is 6 days out from multi level(L3-5) lumbar decompression (11/28/2020) for severe spinal stenosis. He had bilateral subjective LE weakness at baseline prior to surgery and was using a wheel chair for ambulation. He was discharged the day following the surgery (11/29/2020), but ultimately readmitted on 11/30/2020 for urinary retention, additionally the patient was not mobilizing well and his son felt he was unable to care for him alone at home. Cauda equina r/o with perianal sensation and rectal tone exam x2 in addition to post-op MRI. Suspect typical post-op  urinary retention. Foley in. Out patient urology f/u set up. On flomax. Pain better controlled with new medications, patient had been ambulatory with PT using rolling walker. Patient set to go to SNF. At this point patient is stable for D/C to SNF from an orthopedic standpoint. He will f/u in 1 week with Dr. Shon Baton.     Rhodia Albright 12/04/2020, 10:56 AM

## 2020-12-04 NOTE — Progress Notes (Addendum)
Attempted to call report to [facility], asked to call back in 10 minutes.  1400: Report called to [facility], all questions answered. Pt belongings gathered to be sent with him. Waiting on [facility] for transport.

## 2020-12-04 NOTE — Care Management Important Message (Signed)
Important Message  Patient Details  Name: SHEIKH LEVERICH MRN: [id number] Date of Birth: 1955-11-01   Medicare Important Message Given:  Yes - Important Message mailed due to current National Emergency   Verbal consent obtained due to current National Emergency  Relationship to patient: Self Contact Name: Maison Call Date: 12/04/20  Time: 1350 Phone: [phone] Outcome: Spoke with contact Important Message mailed to: Patient address on file    Oralia Rud Mineola Duan 12/04/2020, 1:50 PM

## 2020-12-04 NOTE — Plan of Care (Signed)
°  Problem: Health Behavior/Discharge Planning: °Goal: Ability to manage health-related needs will improve °Outcome: Adequate for Discharge °  °Problem: Activity: °Goal: Risk for activity intolerance will decrease °Outcome: Adequate for Discharge °  °Problem: Pain Managment: °Goal: General experience of comfort will improve °Outcome: Adequate for Discharge °  °

## 2020-12-04 NOTE — Progress Notes (Signed)
Physical Therapy Treatment Patient Details Name: Tyler Garner MRN: [id number] DOB: 1955/08/12 Today's Date: 12/04/2020    History of Present Illness 65yo male admitted 7/9 with complaints of abdominal discomfort, urinary retention, constipation and LE weakness after recent admision where spinal surgery was performed (s/p L2,L3, and L 5 decompressionon 11/28/20.  He discharged the next day ambulating at supervision level.)  PMH: Obesity, OA, HTN,HF, HLD, CAD, and STEMI.    PT Comments    Pt seated in recliner, reviewed application of LSO for correct fit.  Pt continues to benefit from skilled rehab ain a post acute setting.  He reports he has plans to d/c to a skilled nursing facility this afternoon.      Follow Up Recommendations  CIR;Supervision/Assistance - 24 hour     Equipment Recommendations  None recommended by PT    Recommendations for Other Services       Precautions / Restrictions Precautions Precautions: Fall;Back Precaution Booklet Issued: Yes (comment) Precaution Comments: pt recalls BLT rules Required Braces or Orthoses: Spinal Brace Spinal Brace: Lumbar corset;Applied in sitting position Restrictions Weight Bearing Restrictions: No    Mobility  Bed Mobility Overal bed mobility: Needs Assistance Bed Mobility: Rolling;Sidelying to Sit Rolling: Modified independent (Device/Increase time) Sidelying to sit: Min guard       General bed mobility comments: Pt seated in recliner.  Pt noted with brace rising up.  Readjusted brace before standing.    Transfers Overall transfer level: Needs assistance Equipment used: Rolling walker (2 wheeled) Transfers: Sit to/from Stand Sit to Stand: Min assist         General transfer comment: Cues for hand placement to and from seated surface.  Once in standing noted RW too high this session.  PTA adjusted RW for improved fit.  Ambulation/Gait Ambulation/Gait assistance: +2 safety/equipment;Min assist;Mod assist (for  close chair follow.) Gait Distance (Feet): 35 Feet (+ 40 ft) Assistive device: Rolling walker (2 wheeled) Gait Pattern/deviations: Step-through pattern;Decreased step length - right;Decreased step length - left;Shuffle;Trunk flexed;Decreased stride length Gait velocity: slowed   General Gait Details: Assist level varried due to fatigue and B LE weakness.  Pt required cues for head and trunk control.  Pt required seated rest period in between trials.   Stairs             Wheelchair Mobility    Modified Rankin (Stroke Patients Only)       Balance Overall balance assessment: Needs assistance Sitting-balance support: Feet supported;No upper extremity supported Sitting balance-Leahy Scale: Good Sitting balance - Comments: posterior leaning with fatigue, requires at least SL support Postural control: Posterior lean Standing balance support: Bilateral upper extremity supported;During functional activity Standing balance-Leahy Scale: Fair Standing balance comment: Uses RW to off load pressure as needed                            Cognition Arousal/Alertness: Awake/alert Behavior During Therapy: WFL for tasks assessed/performed Overall Cognitive Status: Within Functional Limits for tasks assessed                                        Exercises Other Exercises Other Exercises: Heel raises, 5 sets, standing Other Exercises: mini squats, 5 reps, standing Other Exercises: figure 4 stretching, 5 reps, seated    General Comments General comments (skin integrity, edema, etc.): VSS on RA  Pertinent Vitals/Pain Pain Assessment: Faces Faces Pain Scale: Hurts whole lot Pain Location: Mild pain in back and B Legs Pain Descriptors / Indicators: Grimacing;Guarding;Sore Pain Intervention(s): Monitored during session;Repositioned    Home Living                      Prior Function            PT Goals (current goals can now be found in  the care plan section) Acute Rehab PT Goals Patient Stated Goal: get pt stronger Potential to Achieve Goals: Good Progress towards PT goals: Progressing toward goals    Frequency    Min 5X/week      PT Plan Current plan remains appropriate    Co-evaluation              AM-PAC PT "6 Clicks" Mobility   Outcome Measure  Help needed turning from your back to your side while in a flat bed without using bedrails?: A Little Help needed moving from lying on your back to sitting on the side of a flat bed without using bedrails?: A Little Help needed moving to and from a bed to a chair (including a wheelchair)?: A Little Help needed standing up from a chair using your arms (e.g., wheelchair or bedside chair)?: A Little Help needed to walk in hospital room?: A Little Help needed climbing 3-5 steps with a railing? : A Little 6 Click Score: 18    End of Session Equipment Utilized During Treatment: Back brace;Gait belt Activity Tolerance: Patient limited by pain;Patient limited by fatigue Patient left: in chair;with call bell/phone within reach;with chair alarm set Nurse Communication: Mobility status PT Visit Diagnosis: Other abnormalities of gait and mobility (R26.89);Muscle weakness (generalized) (M62.81);Difficulty in walking, not elsewhere classified (R26.2);Pain Pain - Right/Left: Right Pain - part of body: Leg     Time: [id number] PT Time Calculation (min) (ACUTE ONLY): 17 min  Charges:  $Gait Training: 8-22 mins                     Bonney Leitz , PTA Acute Rehabilitation Services Pager [phone] Office [phone]    Cylan Borum Artis Delay 12/04/2020, 11:05 AM

## 2020-12-04 NOTE — Progress Notes (Signed)
Occupational Therapy Treatment Patient Details Name: Tyler Garner MRN: [id number] DOB: 1955-08-10 Today's Date: 12/04/2020    History of present illness 65yo male admitted 7/9 with complaints of abdominal discomfort, urinary retention, constipation and LE weakness after recent admision where spinal surgery was performed (s/p L2,L3, and L 5 decompressionon 11/28/20.  He discharged the next day ambulating at supervision level.)  PMH: Obesity, OA, HTN,HF, HLD, CAD, and STEMI.   OT comments  Pt making progress with OT goals this session. Very motivated to get OOB and work on mobility. Requiring min guard for bed mobility, min A to stand, and min guard for ambulating in room using RW. Pt continues to present with increased weakness and decreased activity tolerance, however this is improving as well. Pt participated in functional mobility this session, grooming, and exercises to assist with functional mobility and ADL's. Pt continues to show need for skilled intensive therapies to assist with maximizing pt's ability to return to independence. Acute OT will continue to follow to address deficits below.    Follow Up Recommendations  SNF;Supervision/Assistance - 24 hour    Equipment Recommendations  3 in 1 bedside commode;Other (comment) (RW)    Recommendations for Other Services      Precautions / Restrictions Precautions Precautions: Fall;Back Precaution Booklet Issued: Yes (comment) Precaution Comments: pt recalls BLT rules, log roll technique OOB Required Braces or Orthoses: Spinal Brace Spinal Brace: Lumbar corset;Applied in sitting position Restrictions Weight Bearing Restrictions: No       Mobility Bed Mobility Overal bed mobility: Needs Assistance Bed Mobility: Rolling;Sidelying to Sit Rolling: Modified independent (Device/Increase time) Sidelying to sit: Min guard       General bed mobility comments: Pt requires increased time and increased effort with rolling, however does  not need assist, then sitting up from sidelying, pt able to push himself up, min guard for safety    Transfers Overall transfer level: Needs assistance Equipment used: Rolling walker (2 wheeled) Transfers: Sit to/from Stand Sit to Stand: Min assist;Min guard         General transfer comment: Pt requiring min A to powerup to standing, once in standing, pt able to steady and transfer with min guard for safety.    Balance Overall balance assessment: Needs assistance Sitting-balance support: Feet supported;No upper extremity supported Sitting balance-Leahy Scale: Good     Standing balance support: Bilateral upper extremity supported;During functional activity Standing balance-Leahy Scale: Fair Standing balance comment: Uses RW to off load pressure as needed                           ADL either performed or assessed with clinical judgement   ADL Overall ADL's : Needs assistance/impaired     Grooming: Wash/dry hands;Oral care;Min guard;Standing Grooming Details (indicate cue type and reason): completed at sink         Upper Body Dressing : Supervision/safety;Sitting Upper Body Dressing Details (indicate cue type and reason): donned back brace Lower Body Dressing: Maximal assistance;Sitting/lateral leans Lower Body Dressing Details (indicate cue type and reason): Worked on figure 4 positioning for LB dressing, continues to need max A Toilet Transfer: Minimal assistance;Ambulation Toilet Transfer Details (indicate cue type and reason): Ambulated into bathroom, min A to come to standing and steady         Functional mobility during ADLs: Minimal assistance;Min guard General ADL Comments: Pt requiring min A to come to standing, once up he is able to ambulate with min guard using  RW.     Vision       Perception     Praxis      Cognition Arousal/Alertness: Awake/alert Behavior During Therapy: WFL for tasks assessed/performed Overall Cognitive Status: Within  Functional Limits for tasks assessed                                          Exercises Exercises: Other exercises Other Exercises Other Exercises: Heel raises, 5 sets, standing Other Exercises: mini squats, 5 reps, standing Other Exercises: figure 4 stretching, 5 reps, seated   Shoulder Instructions       General Comments VSS on RA    Pertinent Vitals/ Pain       Pain Assessment: No/denies pain Pain Intervention(s): Monitored during session  Home Living                                          Prior Functioning/Environment              Frequency  Min 2X/week        Progress Toward Goals  OT Goals(current goals can now be found in the care plan section)  Progress towards OT goals: Progressing toward goals  Acute Rehab OT Goals Patient Stated Goal: get pt stronger OT Goal Formulation: With patient Time For Goal Achievement: 12/15/20 Potential to Achieve Goals: Good ADL Goals Pt Will Perform Grooming: with modified independence;standing Pt Will Perform Upper Body Bathing: with modified independence;sitting Pt Will Perform Lower Body Bathing: with min guard assist;sitting/lateral leans;sit to/from stand Pt Will Perform Upper Body Dressing: with modified independence;sitting Pt Will Perform Lower Body Dressing: with min assist;with adaptive equipment;sitting/lateral leans;sit to/from stand Pt Will Transfer to Toilet: with modified independence;ambulating  Plan Frequency remains appropriate;Discharge plan remains appropriate    Co-evaluation                 AM-PAC OT "6 Clicks" Daily Activity     Outcome Measure   Help from another person eating meals?: None Help from another person taking care of personal grooming?: A Little Help from another person toileting, which includes using toliet, bedpan, or urinal?: A Little Help from another person bathing (including washing, rinsing, drying)?: A Lot Help from another  person to put on and taking off regular upper body clothing?: A Little Help from another person to put on and taking off regular lower body clothing?: A Lot 6 Click Score: 17    End of Session Equipment Utilized During Treatment: Rolling walker;Back brace  OT Visit Diagnosis: Unsteadiness on feet (R26.81);History of falling (Z91.81);Muscle weakness (generalized) (M62.81) Pain - Right/Left:  (Spine)   Activity Tolerance Patient tolerated treatment well   Patient Left in chair;with call bell/phone within reach;with chair alarm set   Nurse Communication Mobility status        Time: [phone] OT Time Calculation (min): 26 min  Charges: OT General Charges $OT Visit: 1 Visit OT Treatments $Self Care/Home Management : 8-22 mins $Therapeutic Activity: 8-22 mins  Verania Salberg H., OTR/L Acute Rehabilitation  Delia Slatten Elane Alpa Salvo 12/04/2020, 9:36 AM

## 2020-12-04 NOTE — Progress Notes (Signed)
    BRIEF OVERNIGHT PROGRESS REPORT  Notified by RN for patient being assisted to his knees by staff/crew and then assisted to chair for transport. No impact per staff No injury seen/reported per staff No loss of consciousness per staff No loss of baseline mentation per staff Vitals within patient baseline   Chinita Greenland MSNA ACNPC-AG Acute Care Nurse Practitioner [facility] [place]

## 2020-12-04 NOTE — Progress Notes (Signed)
NT assisting pt back from bathroom to chair when pt says his legs became weak and he was unable to hold himself up. NT assisted pt to floor via knees. Staff assisted pt into chair. No injuries noted. Pt stated he felt fine. NP/MD notified. PTAR arrived during incident. Okay for pt to still go to SNF. Pt dressed and assisted to stretcher. Pt declined having family called. Vitals in chart.

## 2020-12-04 NOTE — Discharge Summary (Signed)
Physician Discharge Summary  Tyler Garner:[id number] DOB: [birth year] DOA: 11/30/2020  PCP: Tyler Pierini, Tyler Garner  Admit date: 11/30/2020 Discharge date: 12/04/2020  Admitted From: Home Disposition: SNF  Recommendations for Outpatient Follow-up:  Follow ups as below. Please obtain CBC/BMP/Mag at follow up Voiding trial in 1 week [facility] in [place] ordered. Please follow up on the following pending results: None   Discharge Condition: Stable CODE STATUS: Full code   Follow-up Information     Venita Lick, MD. Schedule an appointment as soon as possible for a visit in 2 week(s).   Specialty: Orthopedic Surgery Why: For suture removal, If symptoms worsen, For wound re-check Contact information: [address] [place] [address] [phone]                Hospital Course: 65 year old M with PMH of systolic CHF, DM-2, HTN, HLD and lumbar DDD s/p recent lumbar decompression on 7/7 by Dr. Shon Baton of [facility] returning to ED that day after discharge with generalized weakness, and admitted with postop ileus, generalized weakness and urinary retention.  He had 600 cc UOP upon Foley insertion.  Patient was on Percocet 10/325 mg every 6 hours as needed pain after surgery.  He has not had a bowel movement since surgery.  He was not on bowel regimen.  On call orthopedic surgery, Dr. Victorino Dike consulted but did not feel there is need to orthopedic intervention.   Orthopedic surgery, Dr. Shon Baton evaluated patient, started Decadron and ordered lumbar MRI which did not show critical stenosis.  Decadron discontinued.  Pain meds adjusted.   Patient failed voiding trial on 7/11.  Foley reinserted.  Renal ultrasound negative.  Flomax added.  He is already on bethanechol.  Recommend voiding trial in 1 week.  Ambulatory referral to urology ordered.   Patient is discharged to SNF per therapy recommendation.   See individual  problem list below for more on hospital course.  Discharge Diagnoses:  Generalized weakness-likely related to recent surgery, opiate and ileus.  No cardiopulmonary symptoms.  Appears euvolemic but difficult exam due to body habitus.  Low suspicion for infectious process.  TSH within normal.  He is anemia is not bad enough to cause his symptoms. -Continue PT/OT at SNF   Lumbar DDD/spinal canal stenosis s/p decompression on 11/28/2020 by Dr. Shon Baton at [facility].  Has honeycomb dressing over surgical site that appears DCI.  Has urinary tension likely from opiates, ileus and constipation versus cauda equina syndrome.  MRI and neuro exam reassuring. -Decadron discontinued. -Discharged on scheduled Tylenol and as needed Robaxin and oxycodone -Bowel regimen as below. -Continue PT/OT at SNF -Outpatient follow-up with orthopedic surgery in 2 weeks or sooner if needed   Postoperative ileus-likely due to pain meds.  Seems to have resolved.  Had a bowel movement on 7/11.  No nausea or vomiting.  Abdominal exam benign. -Aggressive bowel regimen as below.   Acute urinary retention: Likely due to opiates, ileus, constipation and possibly BPH.  Renal US and function reassuring.  Failed voiding trial on 7/11. -Started on bethanechol and Flomax. -Recommend voiding trial in 1 week -[facility]ulatory referral to [facility] in [place] ordered.   Chronic systolic CHF: "Echo (report pending) with EF 45-50%, lat WMA, RVE, mod RV hypokinesis".  He does not seem to be on diuretics.  No cardiopulmonary symptoms.  Appears euvolemic but difficult exam due to body habitus. -Monitor fluid and respiratory status.   History of inferior STEMI  s/p DES to distal  CFX-patient without chest pain or respiratory symptoms. -Continue home metoprolol and lisinopril   Controlled IDDM-2 with hyperlipidemia and hyperglycemia: A1c 6.9%.  On Lantus 60 units twice daily, Amaryl, Janumet and Crestor.  Hyperglycemia likely due to steroid.   Expect improvement now he is off steroid. Recent Labs  Lab 12/03/20 1108 12/03/20 1615 12/03/20 1853 12/03/20 2107 12/04/20 0650  GLUCAP 302* 471* 375* 359* 212*  -Discharged on home medications as below.   Essential hypertension: Normotensive. -Cardiac meds as above   Hypokalemia/hypomagnesemia: Resolved.   Iron deficiency anemia: H&H stable.  Anemia panel consistent with iron deficiency. Severe vitamin B12 deficiency: Vitamin B12 very low at 50. Recent Labs    02/22/20 1023 05/28/20 0905 08/13/20 0836 11/18/20 0929 11/26/20 0921 11/30/20 1805 12/01/20 0445 12/02/20 0213  HGB 13.9 13.4 13.0 12.8* 12.8* 10.6* 10.1* 10.8*  -Vitamin B12 1000 mcg IM daily for 1 week followed by weekly for 4 weeks then monthly -Received IV ferric gluconate 240 mg x 2 on 7/12 -Continue p.o. ferrous sulfate twice daily with bowel regimen -Recheck CBC in 1 to 2 weeks  COVID-19 vaccine -Received a booster vaccine prior to discharge   Class I obesity Body mass index is 32.98 kg/m.  -Encourage lifestyle change to lose weight          Discharge Exam: Vitals:   12/03/20 2131 12/04/20 0721  BP: (!) 136/42 109/66  Pulse: 77 70  Resp: 18 17  Temp: 98.2 F (36.8 C) 98 F (36.7 C)  SpO2: 100% 94%    GENERAL: No apparent distress.  Nontoxic. HEENT: MMM.  Vision and hearing grossly intact.  NECK: Supple.  No apparent JVD.  RESP: On RA no IWOB.  Fair aeration bilaterally. CVS:  RRR. Heart sounds normal.  ABD/GI/GU: Bowel sounds present. Soft. Non tender.  Indwelling Foley. MSK/EXT:  Moves extremities. No apparent deformity. No edema.  SKIN: Dressing over lower back DCI NEURO: Awake, alert and oriented appropriately.  No apparent focal neuro deficit. PSYCH: Calm. Normal affect.   Discharge Instructions  Discharge Instructions     Ambulatory referral to Urology   Complete by: As directed    Diet - low sodium heart healthy   Complete by: As directed    Diet Carb Modified    Complete by: As directed    Increase activity slowly   Complete by: As directed    No wound care   Complete by: As directed    Dressing change at ortho office at follow up      Allergies as of 12/04/2020       Reactions   Percocet [oxycodone-acetaminophen] Nausea And Vomiting        Medication List     STOP taking these medications    acetaminophen 650 MG CR tablet Commonly known as: TYLENOL Replaced by: acetaminophen 500 MG tablet   oxyCODONE-acetaminophen 10-325 MG tablet Commonly known as: Percocet       TAKE these medications    acetaminophen 500 MG tablet Commonly known as: TYLENOL Take 2 tablets (1,000 mg total) by mouth every 8 (eight) hours for 7 days, THEN 2 tablets (1,000 mg total) every 8 (eight) hours as needed. Start taking on: December 04, 2020 Replaces: acetaminophen 650 MG CR tablet   amLODipine 5 MG tablet Commonly known as: NORVASC Take 1 tablet (5 mg total) by mouth daily.   cyanocobalamin 1000 MCG/ML injection Commonly known as: (VITAMIN B-12) Inject 1 mL (1,000 mcg total) into the muscle daily for 5 days, THEN 1  mL (1,000 mcg total) once a week for 30 days, THEN 1 mL (1,000 mcg total) every 30 (thirty) days. Start taking on: December 05, 2020   ferrous sulfate 325 (65 FE) MG tablet Take 1 tablet (325 mg total) by mouth 2 (two) times daily with a meal.   glimepiride 4 MG tablet Commonly known as: AMARYL Take 1 tablet (4 mg total) by mouth 2 (two) times daily.   glucose blood test strip Commonly known as: ACCU-CHEK ACTIVE STRIPS Check blood sugar daily and prn  Dx E11.9   Janumet 50-1000 MG tablet Generic drug: sitaGLIPtin-metformin Take 1 tablet by mouth 2 (two) times daily.   Lantus SoloStar 100 UNIT/ML Solostar Pen Generic drug: insulin glargine Inject 60 Units into the skin 2 (two) times daily.   lisinopril 20 MG tablet Commonly known as: ZESTRIL Take 1 tablet (20 mg total) by mouth daily.   magnesium citrate Soln Take 296 mLs (1  Bottle total) by mouth daily as needed for severe constipation.   methocarbamol 500 MG tablet Commonly known as: Robaxin Take 1 tablet (500 mg total) by mouth every 8 (eight) hours as needed for up to 5 days for muscle spasms.   metoprolol tartrate 25 MG tablet Commonly known as: LOPRESSOR Take 1 tablet (25 mg total) by mouth 2 (two) times daily.   ondansetron 4 MG tablet Commonly known as: Zofran Take 1 tablet (4 mg total) by mouth every 8 (eight) hours as needed for nausea or vomiting.   oxyCODONE 5 MG immediate release tablet Commonly known as: Oxy IR/ROXICODONE Take 1-2 tablets (5-10 mg total) by mouth every 6 (six) hours as needed for up to 5 days for moderate pain or severe pain (5 mg for moderate and 10 mg for severe and breakthrough pain).   Pen Needles 31G X 8 MM Misc Use with lantus solosar pen daily   polyethylene glycol 17 g packet Commonly known as: MIRALAX / GLYCOLAX Take 17 g by mouth 2 (two) times daily as needed for mild constipation.   rosuvastatin 40 MG tablet Commonly known as: Crestor Take 0.5 tablets (20 mg total) by mouth daily.   senna 8.6 MG Tabs tablet Commonly known as: SENOKOT Take 2 tablets (17.2 mg total) by mouth in the morning and at bedtime for 5 days, THEN 2 tablets (17.2 mg total) 2 (two) times daily as needed for moderate constipation. Start taking on: December 04, 2020   tamsulosin 0.4 MG Caps capsule Commonly known as: FLOMAX Take 1 capsule (0.4 mg total) by mouth daily. Start taking on: December 05, 2020        Consultations: Orthopedic surgery  Procedures/Studies: DG Chest 1 View  Result Date: 11/18/2020 CLINICAL DATA:  Pre-op clearance exam EXAM: CHEST  1 VIEW COMPARISON:  06/21/2013 FINDINGS: The heart size and mediastinal contours are within normal limits. Both lungs are clear. The visualized skeletal structures are unremarkable. IMPRESSION: No active disease. Electronically Signed   By: Danae Orleans M.D.   On: 11/18/2020 10:11    DG Lumbar Spine 2-3 Views  Result Date: 11/28/2020 CLINICAL DATA:  Lumbar decompression. EXAM: LUMBAR SPINE - 2-3 VIEW COMPARISON:  10/27/2019 FINDINGS: Two lateral intraoperative cross-table radiographs of the lumbar spine are provided. The comparison lumbar spine radiographs demonstrate transitional lumbosacral anatomy. The transitional segment is considered a partially sacralized L5 with hypoplastic L5-S1 disc (with this numbering convention confirmed with Dr. Shon Baton). On the first image, 2 needles are in place with 1 projecting between the L2 and L3 spinous  processes and the other directed towards the S1 level. On the second image, the tip of a metallic instrument projects over the posterior elements of the lower lumbar spine directed towards the L5 superior endplate/inferior aspect of the L4-5 disc space. IMPRESSION: Intraoperative images as above. These results were called by telephone at the time of interpretation on 11/28/2020 at 8:48 am to Dr. Venita Lick, who verbally acknowledged these results. Electronically Signed   By: Sebastian Ache M.D.   On: 11/28/2020 08:51   DG Abdomen 1 View  Result Date: 11/30/2020 CLINICAL DATA:  Abdominal pain and possible constipation, initial encounter EXAM: ABDOMEN - 1 VIEW COMPARISON:  10/27/2018 FINDINGS: Scattered large and small bowel gas is noted. No definitive free air is seen on this limited exam no definitive obstructive changes seen. Degenerative changes of lumbar spine are noted. IMPRESSION: Limited exam due to patient's inability to lay supine. No obstructive changes are seen. Scattered large and small bowel gas is noted. This may represent a mild postoperative ileus. Correlate with the physical exam. Electronically Signed   By: Alcide Clever M.D.   On: 11/30/2020 19:45   MR LUMBAR SPINE WO CONTRAST  Result Date: 12/02/2020 CLINICAL DATA:  Lumbar decompression 4 days ago. Postoperative urinary retention. EXAM: MRI LUMBAR SPINE WITHOUT CONTRAST TECHNIQUE:  Multiplanar, multisequence MR imaging of the lumbar spine was performed. No intravenous contrast was administered. COMPARISON:  Operative localization images [id number]. FINDINGS: Segmentation:  5 lumbar type vertebral bodies.  L5 is transitional. Alignment:  Normal Vertebrae: No fracture or focal bone lesion. See below regarding postsurgical changes. Conus medullaris and cauda equina: Conus extends to the L1 level. Conus and cauda equina appear normal. Paraspinal and other soft tissues: Expected changes related to laminectomy at L4. Partial resection of spinous process of L3. probable undercutting of the lamina at L3. Disc levels: No abnormality from T11-12 through L1-2. L2-3: Bulging of the disc with shallow protrusion in the left posterolateral to foraminal region. Stenosis of the left lateral recess and intervertebral foramen on the left that could possibly cause left-sided symptoms. L3-4: Previous posterior decompression. Shallow protrusion of the disc, more prominent in the left posterolateral to foraminal region. Left foraminal narrowing could affect the left L3 nerve. There is some fluid posterior to the thecal sac that has some mass-effect upon the thecal sac with flattening of the thecal sac and crowding of the nerve roots. L4-5: Previous posterior decompression. Probable discectomy on the left. Persistent annular bulging. Fluid posterior to the thecal sac with flattening of the thecal sac and crowding of the nerve roots. L5-S1: Transitional level. Mild bulging of the disc. No canal or foraminal stenosis. IMPRESSION: Previous posterior decompression at L3-4 and L4-5. Some fluid in the posterior aspect of the spinal canal in those regions that contributes to flattening of the thecal sac and crowding of the nerve roots. L2-3: Left posterolateral to foraminal disc herniation with stenosis of the left lateral recess and intervertebral foramen on the left that could be symptomatic. L3-4: Left posterolateral to  foraminal disc herniation with potential for neural compression in the foramen on the left. L4-5: Presumed discectomy on the left. Annular bulging. Shallow protrusion of the disc contributes to narrowing of the thecal sac as described above. Electronically Signed   By: Paulina Fusi M.D.   On: 12/02/2020 14:54   DG Lumbar Spine 1 View  Result Date: 11/28/2020 CLINICAL DATA:  Intraop localization. EXAM: LUMBAR SPINE - 1 VIEW COMPARISON:  Immediately preceding radiographs. Lumbar radiograph 10/27/2019 also  reviewed. FINDINGS: Single portable cross-table lateral view of the lumbar spine obtained in the operating room. Same numbering technique as on prior. Surgical instruments localize posterior to the L5 vertebral body and posterior to L3 vertebral body overlying the pedicle. IMPRESSION: Surgical localization with instruments posterior to the L5 vertebral body and L3 vertebral body/pedicle. Electronically Signed   By: Narda Rutherford M.D.   On: 11/28/2020 15:19   US RENAL  Result Date: 12/03/2020 CLINICAL DATA:  Acute urinary retention. EXAM: RENAL / URINARY TRACT ULTRASOUND COMPLETE COMPARISON:  None. FINDINGS: Right Kidney: Renal measurements: 13.4 x 6.6 x 5.6 cm = volume: 259.0 mL. Echogenicity within normal limits. No mass or hydronephrosis visualized. Left Kidney: Renal measurements: 14.1 x 6.2 x 5.7 cm = volume: 260.6 mL. Echogenicity within normal limits. No mass or hydronephrosis visualized. Bladder: Almost completely decompressed with a Foley catheter in place. Other: None. IMPRESSION: Normal appearing kidneys. The urinary bladder is almost completely decompressed with a Foley catheter in place. Electronically Signed   By: Drusilla Kanner M.D.   On: 12/03/2020 14:33       The results of significant diagnostics from this hospitalization (including imaging, microbiology, ancillary and laboratory) are listed below for reference.     Microbiology: Recent Results (from the past 240 hour(s))   Surgical pcr screen     Status: None   Collection Time: 11/26/20  9:19 AM   Specimen: Nasal Mucosa; Nasal Swab  Result Value Ref Range Status   MRSA, PCR NEGATIVE NEGATIVE Final   Staphylococcus aureus NEGATIVE NEGATIVE Final    Comment: (NOTE) The Xpert SA Assay (FDA approved for NASAL specimens in patients 37 years of age and older), is one component of a comprehensive surveillance program. It is not intended to diagnose infection nor to guide or monitor treatment. Performed at [facility], [address] [place], [place] [address]   SARS CORONAVIRUS 2 (TAT 6-24 HRS) Nasopharyngeal Nasopharyngeal Swab     Status: None   Collection Time: 11/26/20  9:20 AM   Specimen: Nasopharyngeal Swab  Result Value Ref Range Status   SARS Coronavirus 2 NEGATIVE NEGATIVE Final    Comment: (NOTE) SARS-CoV-2 target nucleic acids are NOT DETECTED.  The SARS-CoV-2 RNA is generally detectable in upper and lower respiratory specimens during the acute phase of infection. Negative results do not preclude SARS-CoV-2 infection, do not rule out co-infections with other pathogens, and should not be used as the sole basis for treatment or other patient management decisions. Negative results must be combined with clinical observations, patient history, and epidemiological information. The expected result is Negative.  Fact Sheet for Patients: HairSlick.no  Fact Sheet for Healthcare Providers: quierodirigir.com  This test is not yet approved or cleared by the [place] FDA and  has been authorized for detection and/or diagnosis of SARS-CoV-2 by FDA under an Emergency Use Authorization (EUA). This EUA will remain  in effect (meaning this test can be used) for the duration of the COVID-19 declaration under Se ction 564(b)(1) of the Act, 21 U.S.C. section 360bbb-3(b)(1), unless the authorization is terminated or revoked sooner.  Performed  at [facility], [address] [place], [place] [address]   Resp Panel by RT-PCR (Flu A&B, Covid) Nasopharyngeal Swab     Status: None   Collection Time: 11/30/20 10:17 PM   Specimen: Nasopharyngeal Swab; Nasopharyngeal(NP) swabs in vial transport medium  Result Value Ref Range Status   SARS Coronavirus 2 by RT PCR NEGATIVE NEGATIVE Final  Comment: (NOTE) SARS-CoV-2 target nucleic acids are NOT DETECTED.  The SARS-CoV-2 RNA is generally detectable in upper respiratory specimens during the acute phase of infection. The lowest concentration of SARS-CoV-2 viral copies this assay can detect is 138 copies/mL. A negative result does not preclude SARS-Cov-2 infection and should not be used as the sole basis for treatment or other patient management decisions. A negative result may occur with  improper specimen collection/handling, submission of specimen other than nasopharyngeal swab, presence of viral mutation(s) within the areas targeted by this assay, and inadequate number of viral copies(<138 copies/mL). A negative result must be combined with clinical observations, patient history, and epidemiological information. The expected result is Negative.  Fact Sheet for Patients:  BloggerCourse.comhttps://www.fda.gov/media/152166/download  Fact Sheet for Healthcare Providers:  SeriousBroker.ithttps://www.fda.gov/media/152162/download  This test is no t yet approved or cleared by the [place] FDA and  has been authorized for detection and/or diagnosis of SARS-CoV-2 by FDA under an Emergency Use Authorization (EUA). This EUA will remain  in effect (meaning this test can be used) for the duration of the COVID-19 declaration under Section 564(b)(1) of the Act, 21 U.S.C.section 360bbb-3(b)(1), unless the authorization is terminated  or revoked sooner.       Influenza A by PCR NEGATIVE NEGATIVE Final   Influenza B by PCR NEGATIVE NEGATIVE Final    Comment: (NOTE) The Xpert Xpress SARS-CoV-2/FLU/RSV plus  assay is intended as an aid in the diagnosis of influenza from Nasopharyngeal swab specimens and should not be used as a sole basis for treatment. Nasal washings and aspirates are unacceptable for Xpert Xpress SARS-CoV-2/FLU/RSV testing.  Fact Sheet for Patients: BloggerCourse.comhttps://www.fda.gov/media/152166/download  Fact Sheet for Healthcare Providers: SeriousBroker.ithttps://www.fda.gov/media/152162/download  This test is not yet approved or cleared by the [place] FDA and has been authorized for detection and/or diagnosis of SARS-CoV-2 by FDA under an Emergency Use Authorization (EUA). This EUA will remain in effect (meaning this test can be used) for the duration of the COVID-19 declaration under Section 564(b)(1) of the Act, 21 U.S.C. section 360bbb-3(b)(1), unless the authorization is terminated or revoked.  Performed at [facility], [address] [place], [place] [id number]      Labs:  CBC: Recent Labs  Lab 11/30/20 1805 12/01/20 0445 12/02/20 0213  WBC 8.3 7.1 7.1  NEUTROABS 4.0  --   --   HGB 10.6* 10.1* 10.8*  HCT 31.6* 31.4* 32.8*  MCV 90.5 92.6 90.1  PLT 196 223 258   BMP &GFR Recent Labs  Lab 11/30/20 1805 12/01/20 0445 12/01/20 0517 12/02/20 0213 12/03/20 1642  NA 140 138  --  139  --   K 3.2* 3.5  --  4.4  --   CL 106 99  --  100  --   CO2 25 29  --  30  --   GLUCOSE 173* 175*  --  185* 430*  BUN 19 15  --  14  --   CREATININE 0.73 0.76  --  0.71  --   CALCIUM 9.2 9.3  --  9.4  --   MG  --  1.7 1.6* 1.9  --   PHOS  --   --   --  3.7  --    Estimated Creatinine Clearance: 106.5 mL/min (by C-G formula based on SCr of 0.71 mg/dL). Liver & Pancreas: Recent Labs  Lab 11/30/20 1805 12/01/20 0445 12/02/20 0213  AST 30 25  --   ALT 19 19  --   ALKPHOS 42 39  --  BILITOT 0.6 0.8  --   PROT 6.7 6.7  --   ALBUMIN 3.8 3.6 3.5   No results for input(s): LIPASE, AMYLASE in the last 168 hours. No results for input(s): AMMONIA in the last 168  hours. Diabetic: No results for input(s): HGBA1C in the last 72 hours. Recent Labs  Lab 12/03/20 1108 12/03/20 1615 12/03/20 1853 12/03/20 2107 12/04/20 0650  GLUCAP 302* 471* 375* 359* 212*   Cardiac Enzymes: Recent Labs  Lab 12/02/20 0213  CKTOTAL 362   No results for input(s): PROBNP in the last 8760 hours. Coagulation Profile: No results for input(s): INR, PROTIME in the last 168 hours. Thyroid Function Tests: No results for input(s): TSH, T4TOTAL, FREET4, T3FREE, THYROIDAB in the last 72 hours. Lipid Profile: No results for input(s): CHOL, HDL, LDLCALC, TRIG, CHOLHDL, LDLDIRECT in the last 72 hours. Anemia Panel: Recent Labs    12/02/20 0213  VITAMINB12 <50*  FOLATE 29.4  FERRITIN 63  TIBC 428  IRON 32*  RETICCTPCT 1.5   Urine analysis:    Component Value Date/Time   COLORURINE YELLOW 11/30/2020 0053   APPEARANCEUR CLEAR 11/30/2020 0053   APPEARANCEUR Clear 01/31/2019 0819   LABSPEC 1.019 11/30/2020 0053   PHURINE 5.0 11/30/2020 0053   GLUCOSEU 50 (A) 11/30/2020 0053   HGBUR SMALL (A) 11/30/2020 0053   BILIRUBINUR NEGATIVE 11/30/2020 0053   BILIRUBINUR Negative 01/31/2019 0819   KETONESUR 5 (A) 11/30/2020 0053   PROTEINUR NEGATIVE 11/30/2020 0053   NITRITE NEGATIVE 11/30/2020 0053   LEUKOCYTESUR NEGATIVE 11/30/2020 0053   Sepsis Labs: Invalid input(s): PROCALCITONIN, LACTICIDVEN   Time coordinating discharge: 45 minutes  SIGNED:  Almon Hercules, MD  [facility] 12/04/2020, 10:25 AM  If 7PM-7AM, please contact night-coverage [email or web address]

## 2020-12-05 NOTE — ED Notes (Signed)
Peri care completed prior to foley insertion. Error in charting

## 2020-12-08 DIAGNOSIS — E119 Type 2 diabetes mellitus without complications: Secondary | ICD-10-CM | POA: Insufficient documentation

## 2020-12-08 DIAGNOSIS — I240 Acute coronary thrombosis not resulting in myocardial infarction: Secondary | ICD-10-CM | POA: Insufficient documentation

## 2020-12-08 DIAGNOSIS — Z7984 Long term (current) use of oral hypoglycemic drugs: Secondary | ICD-10-CM | POA: Diagnosis not present

## 2020-12-08 DIAGNOSIS — M4800 Spinal stenosis, site unspecified: Secondary | ICD-10-CM | POA: Diagnosis not present

## 2020-12-08 DIAGNOSIS — B9689 Other specified bacterial agents as the cause of diseases classified elsewhere: Secondary | ICD-10-CM | POA: Diagnosis not present

## 2020-12-08 DIAGNOSIS — Z79899 Other long term (current) drug therapy: Secondary | ICD-10-CM | POA: Diagnosis not present

## 2020-12-08 DIAGNOSIS — Z20822 Contact with and (suspected) exposure to covid-19: Secondary | ICD-10-CM | POA: Diagnosis not present

## 2020-12-08 DIAGNOSIS — Z960 Presence of urogenital implants: Secondary | ICD-10-CM | POA: Diagnosis not present

## 2020-12-08 DIAGNOSIS — Z9119 Patient's noncompliance with other medical treatment and regimen: Secondary | ICD-10-CM | POA: Diagnosis not present

## 2020-12-08 DIAGNOSIS — G9341 Metabolic encephalopathy: Secondary | ICD-10-CM | POA: Diagnosis not present

## 2020-12-08 DIAGNOSIS — R339 Retention of urine, unspecified: Secondary | ICD-10-CM | POA: Diagnosis not present

## 2020-12-08 DIAGNOSIS — Z8679 Personal history of other diseases of the circulatory system: Secondary | ICD-10-CM | POA: Diagnosis not present

## 2020-12-08 DIAGNOSIS — I2510 Atherosclerotic heart disease of native coronary artery without angina pectoris: Secondary | ICD-10-CM | POA: Diagnosis not present

## 2020-12-08 DIAGNOSIS — M48061 Spinal stenosis, lumbar region without neurogenic claudication: Secondary | ICD-10-CM | POA: Diagnosis not present

## 2020-12-08 DIAGNOSIS — Z794 Long term (current) use of insulin: Secondary | ICD-10-CM | POA: Insufficient documentation

## 2020-12-08 DIAGNOSIS — R404 Transient alteration of awareness: Secondary | ICD-10-CM | POA: Diagnosis not present

## 2020-12-08 DIAGNOSIS — T83511A Infection and inflammatory reaction due to indwelling urethral catheter, initial encounter: Secondary | ICD-10-CM | POA: Diagnosis not present

## 2020-12-08 DIAGNOSIS — N179 Acute kidney failure, unspecified: Secondary | ICD-10-CM | POA: Diagnosis not present

## 2020-12-08 DIAGNOSIS — M549 Dorsalgia, unspecified: Secondary | ICD-10-CM | POA: Diagnosis not present

## 2020-12-08 DIAGNOSIS — Z9889 Other specified postprocedural states: Secondary | ICD-10-CM | POA: Diagnosis not present

## 2020-12-08 DIAGNOSIS — N390 Urinary tract infection, site not specified: Secondary | ICD-10-CM | POA: Diagnosis not present

## 2020-12-08 DIAGNOSIS — B952 Enterococcus as the cause of diseases classified elsewhere: Secondary | ICD-10-CM | POA: Diagnosis not present

## 2020-12-08 DIAGNOSIS — I255 Ischemic cardiomyopathy: Secondary | ICD-10-CM | POA: Diagnosis not present

## 2020-12-08 DIAGNOSIS — R410 Disorientation, unspecified: Secondary | ICD-10-CM | POA: Diagnosis not present

## 2020-12-08 DIAGNOSIS — Z743 Need for continuous supervision: Secondary | ICD-10-CM | POA: Diagnosis not present

## 2020-12-08 DIAGNOSIS — I10 Essential (primary) hypertension: Secondary | ICD-10-CM | POA: Diagnosis not present

## 2020-12-08 DIAGNOSIS — A419 Sepsis, unspecified organism: Secondary | ICD-10-CM | POA: Diagnosis not present

## 2020-12-08 DIAGNOSIS — Z792 Long term (current) use of antibiotics: Secondary | ICD-10-CM | POA: Diagnosis not present

## 2020-12-23 ENCOUNTER — Ambulatory Visit: Payer: Medicare Other | Admitting: Nurse Practitioner

## 2020-12-23 ENCOUNTER — Ambulatory Visit: Payer: Medicare Other | Admitting: Urology

## 2020-12-23 NOTE — Progress Notes (Deleted)
   Subjective:    Patient ID: Tyler Garner, male    DOB: [birth year], 65 y.o.   MRN: [id number]   Chief Complaint: hospital follow up  HPI Patient comes in for hospital follow up. He has been in and out of the hospital since 11/28/20. He had lumbar decompression 11/28/20. He was readmitted to the hospital after surgery for urinary retention. He ended up being discharged home with foley catheter and started on flomax. He was able to have catheter removed several days later, with no issues. He then was admitted on 12/08/20 with altered mental status. He ended up being diagnosed with UTI and sepsis. He was treated with lenaquin and was discharged home on 12/09/20.     Review of Systems     Objective:   Physical Exam        Assessment & Plan:

## 2020-12-26 ENCOUNTER — Encounter: Payer: Self-pay | Admitting: Nurse Practitioner

## 2020-12-26 DIAGNOSIS — R338 Other retention of urine: Secondary | ICD-10-CM | POA: Diagnosis not present

## 2021-01-01 ENCOUNTER — Encounter: Payer: Self-pay | Admitting: Nurse Practitioner

## 2021-01-01 ENCOUNTER — Other Ambulatory Visit: Payer: Self-pay

## 2021-01-01 ENCOUNTER — Ambulatory Visit (INDEPENDENT_AMBULATORY_CARE_PROVIDER_SITE_OTHER): Payer: Medicare Other | Admitting: Nurse Practitioner

## 2021-01-01 VITALS — BP 144/72 | HR 71 | Temp 98.2°F | Resp 20 | Ht 69.0 in | Wt 220.0 lb

## 2021-01-01 DIAGNOSIS — A419 Sepsis, unspecified organism: Secondary | ICD-10-CM

## 2021-01-01 DIAGNOSIS — Z09 Encounter for follow-up examination after completed treatment for conditions other than malignant neoplasm: Secondary | ICD-10-CM

## 2021-01-01 NOTE — Patient Instructions (Signed)

## 2021-01-01 NOTE — Progress Notes (Signed)
   Subjective:    Patient ID: Tyler Garner, male    DOB: 10-17-1955, 65 y.o.   MRN: [id number]   Chief Complaint: Hospitalization Follow-up   HPI Patient had back surgery on 11/28/20. After being discharged home he had to return to hospital for  urinary retention 2 days later. He was discharged home and went back to the ED on 12/08/20 with altered menta status due to sepsis. He was given IV antibiotics and was discharged home on several days later. He is doing well today. He is still wearing a back brace and walking with a walker.      Review of Systems  Constitutional:  Negative for diaphoresis.  Eyes:  Negative for pain.  Respiratory:  Negative for shortness of breath.   Cardiovascular:  Negative for chest pain, palpitations and leg swelling.  Gastrointestinal:  Negative for abdominal pain.  Endocrine: Negative for polydipsia.  Skin:  Negative for rash.  Neurological:  Negative for dizziness, weakness and headaches.  Hematological:  Does not bruise/bleed easily.  All other systems reviewed and are negative.     Objective:   Physical Exam Vitals and nursing note reviewed.  Constitutional:      Appearance: Normal appearance. He is obese.  Cardiovascular:     Rate and Rhythm: Normal rate and regular rhythm.     Heart sounds: Normal heart sounds.  Pulmonary:     Effort: Pulmonary effort is normal.     Breath sounds: Normal breath sounds.  Musculoskeletal:     Comments: back brace in place- walking with walker  Skin:    General: Skin is warm.  Neurological:     General: No focal deficit present.     Mental Status: He is alert and oriented to person, place, and time.  Psychiatric:        Mood and Affect: Mood normal.        Behavior: Behavior normal.    BP (!) 144/72   Pulse 71   Temp 98.2 F (36.8 C) (Temporal)   Resp 20   Ht 5\' 9"  (1.753 m)   Wt 220 lb (99.8 kg)   SpO2 95%   BMI 32.49 kg/m        Assessment & Plan:  Tyler Garner in today with chief  complaint of Hospitalization Follow-up   1. Sepsis, due to unspecified organism, unspecified whether acute organ dysfunction present (HCC) Finished antibiotics Sepsis has resolve  2. Hospital discharge follow-up Hospital records reviewed    The above assessment and management plan was discussed with the patient. The patient verbalized understanding of and has agreed to the management plan. Patient is aware to call the clinic if symptoms persist or worsen. Patient is aware when to return to the clinic for a follow-up visit. Patient educated on when it is appropriate to go to the emergency department.   [name] Delene Loll, [name]

## 2021-01-09 ENCOUNTER — Other Ambulatory Visit: Payer: Self-pay

## 2021-01-09 ENCOUNTER — Telehealth: Payer: Self-pay | Admitting: *Deleted

## 2021-01-09 ENCOUNTER — Encounter: Payer: Self-pay | Admitting: Physical Therapy

## 2021-01-09 ENCOUNTER — Ambulatory Visit: Payer: Medicare Other | Attending: Orthopedic Surgery | Admitting: Physical Therapy

## 2021-01-09 DIAGNOSIS — M5441 Lumbago with sciatica, right side: Secondary | ICD-10-CM | POA: Insufficient documentation

## 2021-01-09 DIAGNOSIS — M6281 Muscle weakness (generalized): Secondary | ICD-10-CM | POA: Diagnosis not present

## 2021-01-09 DIAGNOSIS — R293 Abnormal posture: Secondary | ICD-10-CM | POA: Diagnosis not present

## 2021-01-09 DIAGNOSIS — G8929 Other chronic pain: Secondary | ICD-10-CM | POA: Insufficient documentation

## 2021-01-09 MED ORDER — ACCU-CHEK GUIDE W/DEVICE KIT: PACK | 0 refills | Status: DC

## 2021-01-09 MED ORDER — ACCU-CHEK SOFTCLIX LANCETS MISC: 3 refills | Status: DC

## 2021-01-09 MED ORDER — ACCU-CHEK GUIDE VI STRP: ORAL_STRIP | 3 refills | Status: DC

## 2021-01-09 NOTE — Telephone Encounter (Signed)
VM rcvd needs new BS meter, does not have one to check his sugars, needs sent to [facility]. Sent to pharmacy

## 2021-01-09 NOTE — Therapy (Signed)
[facility] [address] [place], [place], [address] Phone: [phone]   Fax:  [phone]  Physical Therapy Evaluation  Patient Details  Name: Tyler Garner MRN: [id number] Date of Birth: 12/26/1955 Referring Provider (PT): Venita Lick MD   Encounter Date: 01/09/2021   PT End of Session - 01/09/21 1125     Visit Number 1    Number of Visits 12    Date for PT Re-Evaluation 02/06/21    PT Start Time 1055    PT Stop Time 1137    PT Time Calculation (min) 42 min    Activity Tolerance Patient tolerated treatment well    Behavior During Therapy [facility] for tasks assessed/performed             Past Medical History:  Diagnosis Date   Arthritis    CAD (coronary artery disease)    a. inferior STEMI (05/2013) with RV involvement- LHC:  dist CFX occluded (Promus premier (2.5x24 mm) DES); EF 40-45% with inf HK;  b. Echo (report pending) with EF 45-50%, lat WMA, RVE, mod RV hypokinesis    Diabetes mellitus without complication (HCC)    Hyperglycemia    Hyperlipidemia    Hypertension    Ischemic cardiomyopathy    Myocardial infarction (HCC) 2015   Nicotine addiction    Renal colic    Right shoulder injury 10/11/2009    Past Surgical History:  Procedure Laterality Date    heart stent     LEFT HEART CATHETERIZATION WITH CORONARY ANGIOGRAM N/A 06/21/2013   Procedure: LEFT HEART CATHETERIZATION WITH CORONARY ANGIOGRAM;  Surgeon: Peter M Swaziland, MD;  Location: [facility] CATH LAB;  Service: Cardiovascular;  Laterality: N/A;   LUMBAR LAMINECTOMY/DECOMPRESSION MICRODISCECTOMY N/A 11/28/2020   Procedure: LUMBAR DECOMPRESSION  2 LEVELS, L3 and L5;  Surgeon: Venita Lick, MD;  Location: [facility] OR;  Service: Orthopedics;  Laterality: N/A;  3.5 hrs    There were no vitals filed for this visit.    Subjective Assessment - 01/09/21 1146     Subjective COVID-19 screen performed prior to patient entering clinic.  The patient presents to the clinic today  s/p lumbar decompression surgery (L2-5) performed on 11/28/20.  His back pain level is low today but he states his right LE continues to hurt.  He reports after surgery his right knee gave way and he fell but was not injured.  He is walking with a cane.  He is unable to stand and walk for prolonged periods of time currently.    Pertinent History OA, h/o right shoulder pain, CAD, DM, HTN, MI, been in need of a catheter    How long can you stand comfortably? 5 minutes.    How long can you walk comfortably? Around home with a cane.    Patient Stated Goals Walk without cane, get back to life.                [facility] PT Assessment - 01/09/21 0001       Assessment   Medical Diagnosis Lumbar decompression L2-5    Referring Provider (PT) Venita Lick MD    Onset Date/Surgical Date --   11/28/20 (surgery date).     Precautions   Precaution Comments Please follow lumbar decompression protocol.  Supervised gait please.      Restrictions   Weight Bearing Restrictions No      Balance Screen   Has the patient fallen in the past 6 months Yes    How many times? 3.  Has the patient had a decrease in activity level because of a fear of falling?  No    Is the patient reluctant to leave their home because of a fear of falling?  No      Home Environment   Living Environment Private residence      Prior Function   Level of Independence Independent      Posture/Postural Control   Posture/Postural Control Postural limitations    Postural Limitations Rounded Shoulders;Forward head;Decreased lumbar lordosis;Flexed trunk;Weight shift left    Posture Comments Right knee flexed.      Deep Tendon Reflexes   DTR Assessment Site Patella;Achilles    Patella DTR 2+    Achilles DTR 0      ROM / Strength   AROM / PROM / Strength AROM;Strength      AROM   Overall AROM Comments Trunk flexion limited by 75% and extension to -5 degrees.  Assesed in supine the patient demonstrates WNL for bilateral hip  flexion.      Strength   Overall Strength Comments Left LE 4 to 4+/5.  Right LE 4-/5.      Palpation   Palpation comment Tender to palpation on either side of patient's lumbar incision.      Transfers   Comments HHA from supine to sit.      Ambulation/Gait   Assistive device Straight cane    Gait Pattern Decreased step length - left;Decreased stance time - right;Decreased stride length;Decreased hip/knee flexion - right                        Objective measurements completed on examination: See above findings.       [facility] Adult PT Treatment/Exercise - 01/09/21 0001       Modalities   Modalities Electrical Stimulation;Moist Heat      Moist Heat Therapy   Number Minutes Moist Heat 15 Minutes    Moist Heat Location Lumbar Spine      Electrical Stimulation   Electrical Stimulation Location Low back.    Electrical Stimulation Action IFC at 80-150 Hz. in seated position.    Electrical Stimulation Parameters 40% scan x 15 minutes.    Electrical Stimulation Goals Pain;Tone                         PT Long Term Goals - 01/09/21 1422       PT LONG TERM GOAL #1   Title Patient will be independent with HEP    Time 4    Period Weeks    Status New      PT LONG TERM GOAL #2   Title Bilateral LE strength grade of 5/5 to increase stability for functional tasks.    Time 4    Period Weeks    Status New      PT LONG TERM GOAL #3   Title Patient will report ability to perform ADLs and household chores like laundry with low back pain less than or equal to 3/10.    Time 4    Period Weeks    Status New      PT LONG TERM GOAL #4   Title Walk a community distance without assistive device.    Time 4    Period Weeks    Status New                    Plan - 01/09/21 1416  Clinical Impression Statement The patient presents to [facility] s/p L2-5 lumbar decompression suregry performed on 11/28/20.  His CC is that of pain in his right LE  especially from the knee down.  He exhibits right LE weakness and has had an occasion of her right knee giving way.  His functional mobility is impaired.  He has multiple postural and gait abnormalites.  He is tender to palpation on either side of his lumbar incision, right > left.  He is walking with a straight cane for additional safety.  Patient will benefit from skilled physical therapy intervention to address pain and deficits.    Personal Factors and Comorbidities Comorbidity 1;Comorbidity 2;Other    Comorbidities OA, h/o right shoulder pain, CAD, DM, HTN, MI, been in need of a catheter    Examination-Activity Limitations Locomotion Level;Transfers;Stand;Other    Examination-Participation Restrictions Other    Stability/Clinical Decision Making Stable/Uncomplicated    Clinical Decision Making Low    Rehab Potential Good    PT Frequency 3x / week    PT Duration 4 weeks    PT Treatment/Interventions ADLs/Self Care Home Management;Cryotherapy;Electrical Stimulation;Ultrasound;Moist Heat;Gait training;Stair training;Functional mobility training;Therapeutic activities;Therapeutic exercise;Manual techniques;Patient/family education;Scar mobilization;Passive range of motion    PT Next Visit Plan Nustep, begin low-level core exercises per lumbar decompression protocol.  LE strengthening exercises.  Modalites and STW/M as needed.    Consulted and Agree with Plan of Care Patient             Patient will benefit from skilled therapeutic intervention in order to improve the following deficits and impairments:  Abnormal gait, Difficulty walking, Decreased activity tolerance, Decreased mobility, Decreased strength, Increased muscle spasms, Postural dysfunction, Pain  Visit Diagnosis: Chronic bilateral low back pain with right-sided sciatica - Plan: PT plan of care cert/re-cert  Muscle weakness (generalized) - Plan: PT plan of care cert/re-cert  Abnormal posture - Plan: PT plan of care  cert/re-cert     Problem List Patient Active Problem List   Diagnosis Date Noted   Ileus, postoperative (HCC) 12/01/2020   Acute urinary retention 12/01/2020   Hypokalemia 12/01/2020   DDD (degenerative disc disease), lumbar 12/01/2020   Chronic systolic CHF (congestive heart failure) (HCC) 12/01/2020   Generalized weakness 11/30/2020   Spinal stenosis 11/28/2020   Morbid obesity (HCC) 05/24/2015   Arthritic-like pain 10/25/2014   Primary hypertension 07/18/2014   Uncontrolled diabetes mellitus (HCC) 07/18/2014   Coronary atherosclerosis of native coronary artery 06/24/2013   Hx of myocardial infarction 06/21/2013   Hyperlipidemia 08/27/2010    Lean Jaeger, [place] MPT 01/09/2021, 2:29 PM  [facility] [address] [place], [place], [address] Phone: [phone]   Fax:  [phone]  Name: SWANSON FARNELL MRN: [id number] Date of Birth: 12-Jul-1955

## 2021-01-13 ENCOUNTER — Ambulatory Visit: Payer: Medicare Other | Admitting: Physical Therapy

## 2021-01-13 ENCOUNTER — Other Ambulatory Visit: Payer: Self-pay

## 2021-01-13 DIAGNOSIS — M6281 Muscle weakness (generalized): Secondary | ICD-10-CM

## 2021-01-13 DIAGNOSIS — M5441 Lumbago with sciatica, right side: Secondary | ICD-10-CM

## 2021-01-13 DIAGNOSIS — R293 Abnormal posture: Secondary | ICD-10-CM

## 2021-01-13 DIAGNOSIS — G8929 Other chronic pain: Secondary | ICD-10-CM

## 2021-01-13 NOTE — Therapy (Signed)
[facility] [address] [place], [place], [address] Phone: [phone]   Fax:  [phone]  Physical Therapy Treatment  Patient Details  Name: Tyler Garner MRN: [id number] Date of Birth: 1955-09-09 Referring Provider (PT): Venita Lick MD   Encounter Date: 01/13/2021   PT End of Session - 01/13/21 0903     Visit Number 2    Number of Visits 12    Date for PT Re-Evaluation 02/06/21    PT Start Time 0900    PT Stop Time 0941    PT Time Calculation (min) 41 min    Activity Tolerance Patient tolerated treatment well    Behavior During Therapy [facility] for tasks assessed/performed             Past Medical History:  Diagnosis Date   Arthritis    CAD (coronary artery disease)    a. inferior STEMI (05/2013) with RV involvement- LHC:  dist CFX occluded (Promus premier (2.5x24 mm) DES); EF 40-45% with inf HK;  b. Echo (report pending) with EF 45-50%, lat WMA, RVE, mod RV hypokinesis    Diabetes mellitus without complication (HCC)    Hyperglycemia    Hyperlipidemia    Hypertension    Ischemic cardiomyopathy    Myocardial infarction (HCC) 2015   Nicotine addiction    Renal colic    Right shoulder injury 10/11/2009    Past Surgical History:  Procedure Laterality Date    heart stent     LEFT HEART CATHETERIZATION WITH CORONARY ANGIOGRAM N/A 06/21/2013   Procedure: LEFT HEART CATHETERIZATION WITH CORONARY ANGIOGRAM;  Surgeon: Peter M Swaziland, MD;  Location: [facility] CATH LAB;  Service: Cardiovascular;  Laterality: N/A;   LUMBAR LAMINECTOMY/DECOMPRESSION MICRODISCECTOMY N/A 11/28/2020   Procedure: LUMBAR DECOMPRESSION  2 LEVELS, L3 and L5;  Surgeon: Venita Lick, MD;  Location: [facility] OR;  Service: Orthopedics;  Laterality: N/A;  3.5 hrs    There were no vitals filed for this visit.   Subjective Assessment - 01/13/21 0855     Subjective COVID-19 screen performed prior to patient entering clinic.  Patient arrived with litt;e pain and weakness  in legs    Pertinent History OA, h/o right shoulder pain, CAD, DM, HTN, MI, been in need of a catheter    How long can you stand comfortably? 5 minutes.    How long can you walk comfortably? Around home with a cane.    Patient Stated Goals Walk without cane, get back to life.    Currently in Pain? Yes    Pain Score 2     Pain Location Back    Pain Orientation Lower    Pain Descriptors / Indicators Discomfort    Pain Type Surgical pain    Pain Onset More than a month ago    Pain Frequency Intermittent    Aggravating Factors  in the morning    Pain Relieving Factors with movement                               [facility] Adult PT Treatment/Exercise - 01/13/21 0001       Exercises   Exercises Lumbar;Knee/Hip      Lumbar Exercises: Aerobic   Nustep L3 x71min UE/LE activity, monitored      Lumbar Exercises: Seated   Other Seated Lumbar Exercises seated abdominal bracing x10,glut set x10, core press on red swiss x20, heel lifts x20, hip abd with yellow band x20  Knee/Hip Exercises: Standing   Heel Raises Both;15 reps    Hip Abduction Stengthening;Both;1 set;10 reps;Knee straight      Knee/Hip Exercises: Seated   Long Arc Quad Strengthening;Both;2 sets;10 reps;Weights    Long Arc Quad Weight 2 lbs.    Hamstring Curl Strengthening;Both;1 set;10 reps    Hamstring Limitations red band difficulty                         PT Long Term Goals - 01/13/21 0905       PT LONG TERM GOAL #1   Title Patient will be independent with HEP    Time 4    Period Weeks    Status On-going      PT LONG TERM GOAL #2   Title Bilateral LE strength grade of 5/5 to increase stability for functional tasks.    Time 4    Period Weeks    Status On-going      PT LONG TERM GOAL #3   Title Patient will report ability to perform ADLs and household chores like laundry with low back pain less than or equal to 3/10.    Time 4    Status On-going      PT LONG TERM GOAL #4    Title Walk a community distance without assistive device.    Time 4    Period Weeks    Status On-going                   Plan - 01/13/21 0931     Clinical Impression Statement Patient tolerated treatment well today. Patient requested seated exercises due to difficulty with laying down. Patient tolerated seated exercises well with good understanding of core activation and progression. Patient able to progress with some standing exercises today. Limited due to wekness. Goals progressing today. No modalities needed. Patient fatigue after treatment and required a walker to get back to car with supervision for safety. Patient to bring assit device next treatment.    Personal Factors and Comorbidities Comorbidity 1;Comorbidity 2;Other    Comorbidities OA, h/o right shoulder pain, CAD, DM, HTN, MI, been in need of a catheter    Examination-Activity Limitations Locomotion Level;Transfers;Stand;Other    Examination-Participation Restrictions Other    Stability/Clinical Decision Making Stable/Uncomplicated    Rehab Potential Good    PT Frequency 3x / week    PT Duration 4 weeks    PT Treatment/Interventions ADLs/Self Care Home Management;Cryotherapy;Electrical Stimulation;Ultrasound;Moist Heat;Gait training;Stair training;Functional mobility training;Therapeutic activities;Therapeutic exercise;Manual techniques;Patient/family education;Scar mobilization;Passive range of motion    PT Next Visit Plan Nustep, begin low-level core exercises per lumbar decompression protocol.  LE strengthening exercises.  Modalites and STW/M as needed.    Consulted and Agree with Plan of Care Patient             Patient will benefit from skilled therapeutic intervention in order to improve the following deficits and impairments:  Abnormal gait, Difficulty walking, Decreased activity tolerance, Decreased mobility, Decreased strength, Increased muscle spasms, Postural dysfunction, Pain  Visit  Diagnosis: Chronic bilateral low back pain with right-sided sciatica  Muscle weakness (generalized)  Abnormal posture     Problem List Patient Active Problem List   Diagnosis Date Noted   Ileus, postoperative (HCC) 12/01/2020   Acute urinary retention 12/01/2020   Hypokalemia 12/01/2020   DDD (degenerative disc disease), lumbar 12/01/2020   Chronic systolic CHF (congestive heart failure) (HCC) 12/01/2020   Generalized weakness 11/30/2020   Spinal stenosis 11/28/2020  Morbid obesity (HCC) 05/24/2015   Arthritic-like pain 10/25/2014   Primary hypertension 07/18/2014   Uncontrolled diabetes mellitus (HCC) 07/18/2014   Coronary atherosclerosis of native coronary artery 06/24/2013   Hx of myocardial infarction 06/21/2013   Hyperlipidemia 08/27/2010    Hermelinda Dellen, PTA 01/13/2021, 9:49 AM  [facility] [address] [place], [place], [address] Phone: [phone]   Fax:  [phone]  Name: SAHAS SLUKA MRN: [id number] Date of Birth: [birth year]

## 2021-01-15 ENCOUNTER — Ambulatory Visit: Payer: Medicare Other | Admitting: Physical Therapy

## 2021-01-15 ENCOUNTER — Other Ambulatory Visit: Payer: Self-pay

## 2021-01-15 DIAGNOSIS — R293 Abnormal posture: Secondary | ICD-10-CM

## 2021-01-15 DIAGNOSIS — M5441 Lumbago with sciatica, right side: Secondary | ICD-10-CM | POA: Diagnosis not present

## 2021-01-15 DIAGNOSIS — M6281 Muscle weakness (generalized): Secondary | ICD-10-CM | POA: Diagnosis not present

## 2021-01-15 DIAGNOSIS — G8929 Other chronic pain: Secondary | ICD-10-CM | POA: Diagnosis not present

## 2021-01-15 NOTE — Patient Instructions (Addendum)
  Lower Body: Toe Rise   Standing, place feet apart. Hold arms out for balance or use support. Rise up on toes. Hold _3___ seconds, then lower. Repeat immediately. Repeat __10__ times. Do __2__ sessions per day.   Half Squat to Chair   Stand with feet shoulder width apart. Push buttocks backward and lower slowly, sitting in chair lightly and returning to standing position. Complete _2_ sets of 10_ repetitions. Perform __2-3_ sessions per day.     Hip abduction   While sitting with good posture, tie theraband around knees and pull apart. Slowly resume starting position. x30 1-2 x day  Pelvic Tilt: Posterior - Legs Bent (Supine)  Tighten stomach and buttock and flatten back by rolling pelvis down. Hold _10___ seconds. Relax. Repeat _10-30___ times per set. Do __2__ sets per session. Do _2___ sessions per day.    Heel Raise (Sitting)    Raise heels, keeping toes on floor.  Tighten abdomin and buttock (draw in) Repeat __10__ times per set. Do __1-2__ sets per session. Do __1-2__ sessions per day.   FLEXION: Sitting (Active)    Sit, both feet flat. Lift right knee toward ceiling. Tighten abdomin and buttock (draw in) Complete _10__ sets of _1-2__ repetitions. Perform _1-2__ sessions per day.

## 2021-01-15 NOTE — Therapy (Signed)
[facility] [address] [place], [place], [address] Phone: [phone]   Fax:  [phone]  Physical Therapy Treatment  Patient Details  Name: Tyler Garner MRN: [id number] Date of Birth: 1955/09/14 Referring Provider (PT): Venita Lick MD   Encounter Date: 01/15/2021   PT End of Session - 01/15/21 0902     Visit Number 3    Number of Visits 12    Date for PT Re-Evaluation 02/06/21    PT Start Time 0900    PT Stop Time 0942    PT Time Calculation (min) 42 min    Activity Tolerance Patient tolerated treatment well    Behavior During Therapy [facility] for tasks assessed/performed             Past Medical History:  Diagnosis Date   Arthritis    CAD (coronary artery disease)    a. inferior STEMI (05/2013) with RV involvement- LHC:  dist CFX occluded (Promus premier (2.5x24 mm) DES); EF 40-45% with inf HK;  b. Echo (report pending) with EF 45-50%, lat WMA, RVE, mod RV hypokinesis    Diabetes mellitus without complication (HCC)    Hyperglycemia    Hyperlipidemia    Hypertension    Ischemic cardiomyopathy    Myocardial infarction (HCC) 2015   Nicotine addiction    Renal colic    Right shoulder injury 10/11/2009    Past Surgical History:  Procedure Laterality Date    heart stent     LEFT HEART CATHETERIZATION WITH CORONARY ANGIOGRAM N/A 06/21/2013   Procedure: LEFT HEART CATHETERIZATION WITH CORONARY ANGIOGRAM;  Surgeon: Peter M Swaziland, MD;  Location: [facility] CATH LAB;  Service: Cardiovascular;  Laterality: N/A;   LUMBAR LAMINECTOMY/DECOMPRESSION MICRODISCECTOMY N/A 11/28/2020   Procedure: LUMBAR DECOMPRESSION  2 LEVELS, L3 and L5;  Surgeon: Venita Lick, MD;  Location: [facility] OR;  Service: Orthopedics;  Laterality: N/A;  3.5 hrs    There were no vitals filed for this visit.   Subjective Assessment - 01/15/21 0852     Subjective COVID-19 screen performed prior to patient entering clinic.  Patient reported ome soreness today yet doing  good and has cane today.    Pertinent History OA, h/o right shoulder pain, CAD, DM, HTN, MI, been in need of a catheter    How long can you stand comfortably? 5 minutes.    How long can you walk comfortably? Around home with a cane.    Patient Stated Goals Walk without cane, get back to life.    Currently in Pain? Yes    Pain Score 4     Pain Location Back    Pain Orientation Lower    Pain Descriptors / Indicators Discomfort    Pain Type Surgical pain    Pain Onset More than a month ago    Pain Frequency Intermittent    Aggravating Factors  in the morning    Pain Relieving Factors with movement                               [facility] Adult PT Treatment/Exercise - 01/15/21 0001       Lumbar Exercises: Aerobic   Nustep L3 x64min UE/LE activity, monitored      Lumbar Exercises: Seated   Other Seated Lumbar Exercises seated core activation with yellow band for rows x15 and latt pull x15      Knee/Hip Exercises: Standing   Other Standing Knee Exercises standing press downs  on red swiss for core sets x20      Knee/Hip Exercises: Seated   Long Arc Quad Strengthening;Both;2 sets;10 reps;Weights    Long Arc Quad Weight 2 lbs.    Ball Squeeze x20    Clamshell with TheraBand Yellow   with bracing   Careers information officer;Both   yellow band x 8reps each LE   Hamstring Curl Strengthening;Both;1 set;15 reps    Hamstring Limitations yellow band    Sit to Sand 5 reps;with UE support   focus on glut                   PT Education - 01/15/21 0934     Education Details HEP    Person(s) Educated Patient    Methods Explanation;Demonstration;Handout    Comprehension Verbalized understanding;Returned demonstration                 PT Long Term Goals - 01/13/21 0905       PT LONG TERM GOAL #1   Title Patient will be independent with HEP    Time 4    Period Weeks    Status On-going      PT LONG TERM GOAL #2   Title Bilateral LE strength grade of 5/5  to increase stability for functional tasks.    Time 4    Period Weeks    Status On-going      PT LONG TERM GOAL #3   Title Patient will report ability to perform ADLs and household chores like laundry with low back pain less than or equal to 3/10.    Time 4    Status On-going      PT LONG TERM GOAL #4   Title Walk a community distance without assistive device.    Time 4    Period Weeks    Status On-going                   Plan - 01/15/21 0925     Clinical Impression Statement Patient tolerted treatment well today. Patient has some soreness after last treatment. Today progressed slowly to improve core and LE strength. Patient issued inital HEP for home progression. No modalities needed. Goals progressing this week.    Personal Factors and Comorbidities Comorbidity 1;Comorbidity 2;Other    Comorbidities OA, h/o right shoulder pain, CAD, DM, HTN, MI, been in need of a catheter    Examination-Activity Limitations Locomotion Level;Transfers;Stand;Other    Examination-Participation Restrictions Other    Stability/Clinical Decision Making Stable/Uncomplicated    Rehab Potential Good    PT Frequency 3x / week    PT Duration 4 weeks    PT Treatment/Interventions ADLs/Self Care Home Management;Cryotherapy;Electrical Stimulation;Ultrasound;Moist Heat;Gait training;Stair training;Functional mobility training;Therapeutic activities;Therapeutic exercise;Manual techniques;Patient/family education;Scar mobilization;Passive range of motion    PT Next Visit Plan Nustep, begin low-level core exercises per lumbar decompression protocol.  LE strengthening exercises.  Modalites and STW/M as needed. GENTLE PROGRESSION    Consulted and Agree with Plan of Care Patient             Patient will benefit from skilled therapeutic intervention in order to improve the following deficits and impairments:  Abnormal gait, Difficulty walking, Decreased activity tolerance, Decreased mobility, Decreased  strength, Increased muscle spasms, Postural dysfunction, Pain  Visit Diagnosis: Chronic bilateral low back pain with right-sided sciatica  Muscle weakness (generalized)  Abnormal posture     Problem List Patient Active Problem List   Diagnosis Date Noted   Ileus, postoperative (HCC) 12/01/2020  Acute urinary retention 12/01/2020   Hypokalemia 12/01/2020   DDD (degenerative disc disease), lumbar 12/01/2020   Chronic systolic CHF (congestive heart failure) (HCC) 12/01/2020   Generalized weakness 11/30/2020   Spinal stenosis 11/28/2020   Morbid obesity (HCC) 05/24/2015   Arthritic-like pain 10/25/2014   Primary hypertension 07/18/2014   Uncontrolled diabetes mellitus (HCC) 07/18/2014   Coronary atherosclerosis of native coronary artery 06/24/2013   Hx of myocardial infarction 06/21/2013   Hyperlipidemia 08/27/2010    Hermelinda Dellen, PTA 01/15/2021, 9:43 AM  [facility] [address] [place], [place], [address] Phone: [phone]   Fax:  [phone]  Name: DEFORREST BOGLE MRN: [id number] Date of Birth: January 06, 1956

## 2021-01-20 ENCOUNTER — Encounter: Payer: Self-pay | Admitting: Physical Therapy

## 2021-01-20 ENCOUNTER — Ambulatory Visit: Payer: Medicare Other | Admitting: Physical Therapy

## 2021-01-20 ENCOUNTER — Other Ambulatory Visit: Payer: Self-pay

## 2021-01-20 DIAGNOSIS — M5441 Lumbago with sciatica, right side: Secondary | ICD-10-CM | POA: Diagnosis not present

## 2021-01-20 DIAGNOSIS — G8929 Other chronic pain: Secondary | ICD-10-CM

## 2021-01-20 DIAGNOSIS — M6281 Muscle weakness (generalized): Secondary | ICD-10-CM | POA: Diagnosis not present

## 2021-01-20 DIAGNOSIS — R293 Abnormal posture: Secondary | ICD-10-CM | POA: Diagnosis not present

## 2021-01-20 NOTE — Therapy (Signed)
[facility] [address] [place], [place], [address] Phone: [phone]   Fax:  [phone]  Physical Therapy Treatment  Patient Details  Name: Tyler Garner MRN: [id number] Date of Birth: 1956-03-13 Referring Provider (PT): Venita Lick MD   Encounter Date: 01/20/2021   PT End of Session - 01/20/21 0904     Visit Number 4    Number of Visits 12    Date for PT Re-Evaluation 02/06/21    PT Start Time 0900    PT Stop Time 0943    PT Time Calculation (min) 43 min    Equipment Utilized During Treatment Other (comment)   SPC   Activity Tolerance Patient tolerated treatment well    Behavior During Therapy [facility] for tasks assessed/performed             Past Medical History:  Diagnosis Date   Arthritis    CAD (coronary artery disease)    a. inferior STEMI (05/2013) with RV involvement- LHC:  dist CFX occluded (Promus premier (2.5x24 mm) DES); EF 40-45% with inf HK;  b. Echo (report pending) with EF 45-50%, lat WMA, RVE, mod RV hypokinesis    Diabetes mellitus without complication (HCC)    Hyperglycemia    Hyperlipidemia    Hypertension    Ischemic cardiomyopathy    Myocardial infarction (HCC) 2015   Nicotine addiction    Renal colic    Right shoulder injury 10/11/2009    Past Surgical History:  Procedure Laterality Date    heart stent     LEFT HEART CATHETERIZATION WITH CORONARY ANGIOGRAM N/A 06/21/2013   Procedure: LEFT HEART CATHETERIZATION WITH CORONARY ANGIOGRAM;  Surgeon: Peter M Swaziland, MD;  Location[facility] CATH LAB;  Service: Cardiovascular;  Laterality: N/A;   LUMBAR LAMINECTOMY/DECOMPRESSION MICRODISCECTOMY N/A 11/28/2020   Procedure: LUMBAR DECOMPRESSION  2 LEVELS, L3 and L5;  Surgeon: Venita Lick, MD;  Location: [facility] OR;  Service: Orthopedics;  Laterality: N/A;  3.5 hrs    There were no vitals filed for this visit.   Subjective Assessment - 01/20/21 0901     Subjective COVID-19 screen performed prior to patient  entering clinic. Reports some discomfort upon waking but stops after moving. Reports that he has felt better.    Pertinent History OA, h/o right shoulder pain, CAD, DM, HTN, MI, been in need of a catheter    How long can you stand comfortably? 5 minutes.    How long can you walk comfortably? Around home with a cane.    Patient Stated Goals Walk without cane, get back to life.    Currently in Pain? No/denies                [facility] PT Assessment - 01/20/21 0001       Assessment   Medical Diagnosis Lumbar decompression L2-5    Referring Provider (PT) Venita Lick MD    Onset Date/Surgical Date 11/28/20    Next MD Visit 01/2021      Precautions   Precaution Comments Please follow lumbar decompression protocol.  Supervised gait please.      Restrictions   Weight Bearing Restrictions No                           [facility] Adult PT Treatment/Exercise - 01/20/21 0001       Lumbar Exercises: Aerobic   Nustep L3 x16 min      Lumbar Exercises: Seated   Other Seated Lumbar Exercises seated  core activation with red band for rows, diagonals x20 reps      Knee/Hip Exercises: Standing   Other Standing Knee Exercises Standing press down at cabinet x      Knee/Hip Exercises: Seated   Long Arc Quad AROM;Both;2 sets;10 reps    Clamshell with TheraBand Red   x30 reps   Marching AROM;Both;5 reps    Sit to Sand 5 reps;with UE support                         PT Long Term Goals - 01/13/21 0905       PT LONG TERM GOAL #1   Title Patient will be independent with HEP    Time 4    Period Weeks    Status On-going      PT LONG TERM GOAL #2   Title Bilateral LE strength grade of 5/5 to increase stability for functional tasks.    Time 4    Period Weeks    Status On-going      PT LONG TERM GOAL #3   Title Patient will report ability to perform ADLs and household chores like laundry with low back pain less than or equal to 3/10.    Time 4    Status On-going       PT LONG TERM GOAL #4   Title Walk a community distance without assistive device.    Time 4    Period Weeks    Status On-going                   Plan - 01/20/21 0957     Clinical Impression Statement Patient presented in clinic with reports of weakness of LEs. Patient still requiring use of the catheter. Patient progressed through light core strengthening as well as LE strengthening. Patient especially weak with RLE especially in short hip flexion. Patient observed standing and walking with small grade of trunk flexion with L side lean. Patient denies picking anything up but uses reacher or kicks the object on the floor.    Personal Factors and Comorbidities Comorbidity 1;Comorbidity 2;Other    Comorbidities OA, h/o right shoulder pain, CAD, DM, HTN, MI, been in need of a catheter    Examination-Activity Limitations Locomotion Level;Transfers;Stand;Other    Examination-Participation Restrictions Other    Stability/Clinical Decision Making Stable/Uncomplicated    Rehab Potential Good    PT Frequency 3x / week    PT Duration 4 weeks    PT Treatment/Interventions ADLs/Self Care Home Management;Cryotherapy;Electrical Stimulation;Ultrasound;Moist Heat;Gait training;Stair training;Functional mobility training;Therapeutic activities;Therapeutic exercise;Manual techniques;Patient/family education;Scar mobilization;Passive range of motion    PT Next Visit Plan Nustep, begin low-level core exercises per lumbar decompression protocol.  LE strengthening exercises.  Modalites and STW/M as needed. GENTLE PROGRESSION    Consulted and Agree with Plan of Care Patient             Patient will benefit from skilled therapeutic intervention in order to improve the following deficits and impairments:  Abnormal gait, Difficulty walking, Decreased activity tolerance, Decreased mobility, Decreased strength, Increased muscle spasms, Postural dysfunction, Pain  Visit Diagnosis: Chronic bilateral  low back pain with right-sided sciatica  Muscle weakness (generalized)  Abnormal posture     Problem List Patient Active Problem List   Diagnosis Date Noted   Ileus, postoperative (HCC) 12/01/2020   Acute urinary retention 12/01/2020   Hypokalemia 12/01/2020   DDD (degenerative disc disease), lumbar 12/01/2020   Chronic systolic CHF (congestive heart failure) (  HCC) 12/01/2020   Generalized weakness 11/30/2020   Spinal stenosis 11/28/2020   Morbid obesity (HCC) 05/24/2015   Arthritic-like pain 10/25/2014   Primary hypertension 07/18/2014   Uncontrolled diabetes mellitus (HCC) 07/18/2014   Coronary atherosclerosis of native coronary artery 06/24/2013   Hx of myocardial infarction 06/21/2013   Hyperlipidemia 08/27/2010    Marvell Fuller, PTA 01/20/2021, 10:04 AM  [facility] [address] [place], [place], [address] Phone: [phone]   Fax:  [phone]  Name: Tyler Garner MRN: [id number] Date of Birth: 07-24-1955

## 2021-01-22 ENCOUNTER — Ambulatory Visit: Payer: Medicare Other | Admitting: Physical Therapy

## 2021-01-22 ENCOUNTER — Other Ambulatory Visit: Payer: Self-pay

## 2021-01-22 ENCOUNTER — Encounter: Payer: Self-pay | Admitting: Physical Therapy

## 2021-01-22 DIAGNOSIS — M5441 Lumbago with sciatica, right side: Secondary | ICD-10-CM | POA: Diagnosis not present

## 2021-01-22 DIAGNOSIS — R293 Abnormal posture: Secondary | ICD-10-CM | POA: Diagnosis not present

## 2021-01-22 DIAGNOSIS — M6281 Muscle weakness (generalized): Secondary | ICD-10-CM | POA: Diagnosis not present

## 2021-01-22 DIAGNOSIS — G8929 Other chronic pain: Secondary | ICD-10-CM

## 2021-01-22 NOTE — Therapy (Signed)
[facility] [address] [place], [place], [address] Phone: [phone]   Fax:  [phone]  Physical Therapy Treatment  Patient Details  Name: Tyler Garner MRN: [id number] Date of Birth: 1955/11/30 Referring Provider (PT): Venita Lick MD   Encounter Date: 01/22/2021   PT End of Session - 01/22/21 0903     Visit Number 5    Number of Visits 12    Date for PT Re-Evaluation 02/06/21    PT Start Time 0901    PT Stop Time 0945    PT Time Calculation (min) 44 min    Equipment Utilized During Treatment Other (comment)   SPC   Activity Tolerance Patient tolerated treatment well    Behavior During Therapy [facility] for tasks assessed/performed             Past Medical History:  Diagnosis Date   Arthritis    CAD (coronary artery disease)    a. inferior STEMI (05/2013) with RV involvement- LHC:  dist CFX occluded (Promus premier (2.5x24 mm) DES); EF 40-45% with inf HK;  b. Echo (report pending) with EF 45-50%, lat WMA, RVE, mod RV hypokinesis    Diabetes mellitus without complication (HCC)    Hyperglycemia    Hyperlipidemia    Hypertension    Ischemic cardiomyopathy    Myocardial infarction (HCC) 2015   Nicotine addiction    Renal colic    Right shoulder injury 10/11/2009    Past Surgical History:  Procedure Laterality Date    heart stent     LEFT HEART CATHETERIZATION WITH CORONARY ANGIOGRAM N/A 06/21/2013   Procedure: LEFT HEART CATHETERIZATION WITH CORONARY ANGIOGRAM;  Surgeon: Peter M Swaziland, MD;  Location: [facility] CATH LAB;  Service: Cardiovascular;  Laterality: N/A;   LUMBAR LAMINECTOMY/DECOMPRESSION MICRODISCECTOMY N/A 11/28/2020   Procedure: LUMBAR DECOMPRESSION  2 LEVELS, L3 and L5;  Surgeon: Venita Lick, MD;  Location: [facility] OR;  Service: Orthopedics;  Laterality: N/A;  3.5 hrs    There were no vitals filed for this visit.   Subjective Assessment - 01/22/21 0902     Subjective COVID-19 screen performed prior to patient  entering clinic. Reports that he has felt better.    Pertinent History OA, h/o right shoulder pain, CAD, DM, HTN, MI, been in need of a catheter    How long can you stand comfortably? 5 minutes.    How long can you walk comfortably? Around home with a cane.    Patient Stated Goals Walk without cane, get back to life.    Currently in Pain? No/denies                [facility] PT Assessment - 01/22/21 0001       Assessment   Medical Diagnosis Lumbar decompression L2-5    Referring Provider (PT) Venita Lick MD    Onset Date/Surgical Date 11/28/20    Next MD Visit 01/2021      Precautions   Precaution Comments Please follow lumbar decompression protocol.  Supervised gait please.                           [facility] Adult PT Treatment/Exercise - 01/22/21 0001       Lumbar Exercises: Aerobic   Nustep L3 x16 min      Lumbar Exercises: Standing   Other Standing Lumbar Exercises Core press at file cabinet x10 reps 10 sec holds    Other Standing Lumbar Exercises Standing march with core activation  x10 reps      Lumbar Exercises: Seated   Long Arc Quad on Chair AROM;Both;3 sets;10 reps    Hip Flexion on Ball AROM;Both;20 reps    Other Seated Lumbar Exercises Seated row blue XTS x20 reps      Knee/Hip Exercises: Seated   Clamshell with TheraBand Yellow   x30 reps   Hamstring Curl Strengthening;Both;2 sets;10 reps;Limitations    Hamstring Limitations yellow band                         PT Long Term Goals - 01/13/21 0905       PT LONG TERM GOAL #1   Title Patient will be independent with HEP    Time 4    Period Weeks    Status On-going      PT LONG TERM GOAL #2   Title Bilateral LE strength grade of 5/5 to increase stability for functional tasks.    Time 4    Period Weeks    Status On-going      PT LONG TERM GOAL #3   Title Patient will report ability to perform ADLs and household chores like laundry with low back pain less than or equal to  3/10.    Time 4    Status On-going      PT LONG TERM GOAL #4   Title Walk a community distance without assistive device.    Time 4    Period Weeks    Status On-going                   Plan - 01/22/21 1038     Clinical Impression Statement Patient presented in clinic with reports of weakness of LEs especially RLE. Patient unable to attain position for seated hip stretching. Patient limited in sitting and standing marching due to RLE. Core activation and postural cues provided throughout therex. Rest breaks and slow reps were also instructed as well. Slow transfers noted due to discomfort and patient initially stands with trunk flexion upon standing.    Personal Factors and Comorbidities Comorbidity 1;Comorbidity 2;Other    Comorbidities OA, h/o right shoulder pain, CAD, DM, HTN, MI, been in need of a catheter    Examination-Activity Limitations Locomotion Level;Transfers;Stand;Other    Examination-Participation Restrictions Other    Stability/Clinical Decision Making Stable/Uncomplicated    Rehab Potential Good    PT Frequency 3x / week    PT Duration 4 weeks    PT Treatment/Interventions ADLs/Self Care Home Management;Cryotherapy;Electrical Stimulation;Ultrasound;Moist Heat;Gait training;Stair training;Functional mobility training;Therapeutic activities;Therapeutic exercise;Manual techniques;Patient/family education;Scar mobilization;Passive range of motion    PT Next Visit Plan Nustep, begin low-level core exercises per lumbar decompression protocol.  LE strengthening exercises.  Modalites and STW/M as needed. GENTLE PROGRESSION    Consulted and Agree with Plan of Care Patient             Patient will benefit from skilled therapeutic intervention in order to improve the following deficits and impairments:  Abnormal gait, Difficulty walking, Decreased activity tolerance, Decreased mobility, Decreased strength, Increased muscle spasms, Postural dysfunction, Pain  Visit  Diagnosis: Chronic bilateral low back pain with right-sided sciatica  Muscle weakness (generalized)  Abnormal posture     Problem List Patient Active Problem List   Diagnosis Date Noted   Ileus, postoperative (HCC) 12/01/2020   Acute urinary retention 12/01/2020   Hypokalemia 12/01/2020   DDD (degenerative disc disease), lumbar 12/01/2020   Chronic systolic CHF (congestive heart failure) (HCC) 12/01/2020  Generalized weakness 11/30/2020   Spinal stenosis 11/28/2020   Morbid obesity (HCC) 05/24/2015   Arthritic-like pain 10/25/2014   Primary hypertension 07/18/2014   Uncontrolled diabetes mellitus (HCC) 07/18/2014   Coronary atherosclerosis of native coronary artery 06/24/2013   Hx of myocardial infarction 06/21/2013   Hyperlipidemia 08/27/2010    Marvell Fuller, PTA 01/22/2021, 10:46 AM  [facility] [address] [place], [place], [address] Phone: [phone]   Fax:  [phone]  Name: Tyler Garner MRN: [id number] Date of Birth: 06/12/1955

## 2021-01-28 ENCOUNTER — Other Ambulatory Visit: Payer: Self-pay

## 2021-01-28 ENCOUNTER — Ambulatory Visit: Payer: Medicare Other | Attending: Orthopedic Surgery | Admitting: Physical Therapy

## 2021-01-28 DIAGNOSIS — R293 Abnormal posture: Secondary | ICD-10-CM | POA: Diagnosis not present

## 2021-01-28 DIAGNOSIS — M5441 Lumbago with sciatica, right side: Secondary | ICD-10-CM | POA: Insufficient documentation

## 2021-01-28 DIAGNOSIS — M6281 Muscle weakness (generalized): Secondary | ICD-10-CM | POA: Diagnosis not present

## 2021-01-28 DIAGNOSIS — G8929 Other chronic pain: Secondary | ICD-10-CM

## 2021-01-28 NOTE — Therapy (Addendum)
[facility] [place], [place], [address] Phone: [phone]   Fax:  [phone]  Physical Therapy Treatment  Patient Details  Name: Tyler Garner MRN: [id number] Date of Birth: Jun 10, 1955 Referring Provider (PT): Melina Schools MD   Encounter Date: 01/28/2021   PT End of Session - 01/28/21 1046     Visit Number 6    Number of Visits 12    Date for PT Re-Evaluation 02/06/21    PT Start Time 0945    PT Stop Time 1031    PT Time Calculation (min) 46 min    Activity Tolerance Patient tolerated treatment well    Behavior During Therapy [facility] for tasks assessed/performed             Past Medical History:  Diagnosis Date   Arthritis    CAD (coronary artery disease)    a. inferior STEMI (05/2013) with RV involvement- LHC:  dist CFX occluded (Promus premier (2.5x24 mm) DES); EF 40-45% with inf HK;  b. Echo (report pending) with EF 45-50%, lat WMA, RVE, mod RV hypokinesis    Diabetes mellitus without complication ([place])    Hyperglycemia    Hyperlipidemia    Hypertension    Ischemic cardiomyopathy    Myocardial infarction ([place]) 2015   Nicotine addiction    Renal colic    Right shoulder injury 10/11/2009    Past Surgical History:  Procedure Laterality Date    heart stent     LEFT HEART CATHETERIZATION WITH CORONARY ANGIOGRAM N/A 06/21/2013   Procedure: LEFT HEART CATHETERIZATION WITH CORONARY ANGIOGRAM;  Surgeon: Peter M Martinique, MD;  Location: [facility] CATH LAB;  Service: Cardiovascular;  Laterality: N/A;   LUMBAR LAMINECTOMY/DECOMPRESSION MICRODISCECTOMY N/A 11/28/2020   Procedure: LUMBAR DECOMPRESSION  2 LEVELS, L3 and L5;  Surgeon: Melina Schools, MD;  Location: [place];  Service: Orthopedics;  Laterality: N/A;  3.5 hrs    There were no vitals filed for this visit.   Subjective Assessment - 01/28/21 1045     Subjective COVID-19 screen performed prior to patient entering clinic.  Pain around a 3.    Pertinent History OA, h/o  right shoulder pain, CAD, DM, HTN, MI, been in need of a catheter    How long can you stand comfortably? 5 minutes.    How long can you walk comfortably? Around home with a cane.    Patient Stated Goals Walk without cane, get back to life.    Currently in Pain? Yes    Pain Score 3     Pain Location Back    Pain Orientation Lower    Pain Descriptors / Indicators Discomfort    Pain Type Surgical pain    Pain Onset More than a month ago                               [facility] Adult PT Treatment/Exercise - 01/28/21 0001       Exercises   Exercises Knee/Hip      Lumbar Exercises: Aerobic   Nustep Level 3 x 17 minutes.      Modalities   Modalities Teacher, English as a foreign language Location LB    Electrical Stimulation Action IFC at 80-150 Hz.    Electrical Stimulation Parameters 40% scan x 15 minutes.    Electrical Stimulation Goals Pain;Tone      Manual Therapy   Manual Therapy Soft tissue  mobilization    Soft tissue mobilization left sdly position with pillow between knees for comfort:  STW/M x 6 minutes to patient's bilateral lumbar muscualture on either side of his incision.                         PT Long Term Goals - 01/13/21 0905       PT LONG TERM GOAL #1   Title Patient will be independent with HEP    Time 4    Period Weeks    Status On-going      PT LONG TERM GOAL #2   Title Bilateral LE strength grade of 5/5 to increase stability for functional tasks.    Time 4    Period Weeks    Status On-going      PT LONG TERM GOAL #3   Title Patient will report ability to perform ADLs and household chores like laundry with low back pain less than or equal to 3/10.    Time 4    Status On-going      PT LONG TERM GOAL #4   Title Walk a community distance without assistive device.    Time 4    Period Weeks    Status On-going                   Plan - 01/28/21 1048     Clinical  Impression Statement Patient doing well.  He repsonded well to STW?m today and felt better after treatment.    Personal Factors and Comorbidities Comorbidity 1;Comorbidity 2;Other    Comorbidities OA, h/o right shoulder pain, CAD, DM, HTN, MI, been in need of a catheter    Examination-Activity Limitations Locomotion Level;Transfers;Stand;Other    Examination-Participation Restrictions Other    Stability/Clinical Decision Making Stable/Uncomplicated    Rehab Potential Good    PT Frequency 3x / week    PT Duration 4 weeks    PT Treatment/Interventions ADLs/Self Care Home Management;Cryotherapy;Electrical Stimulation;Ultrasound;Moist Heat;Gait training;Stair training;Functional mobility training;Therapeutic activities;Therapeutic exercise;Manual techniques;Patient/family education;Scar mobilization;Passive range of motion    PT Next Visit Plan Nustep, begin low-level core exercises per lumbar decompression protocol.  LE strengthening exercises.  Modalites and STW/M as needed. GENTLE PROGRESSION    Consulted and Agree with Plan of Care Patient             Patient will benefit from skilled therapeutic intervention in order to improve the following deficits and impairments:  Abnormal gait, Difficulty walking, Decreased activity tolerance, Decreased mobility, Decreased strength, Increased muscle spasms, Postural dysfunction, Pain  Visit Diagnosis: Chronic bilateral low back pain with right-sided sciatica  Muscle weakness (generalized)  Abnormal posture     Problem List Patient Active Problem List   Diagnosis Date Noted   Ileus, postoperative ([place]) 12/01/2020   Acute urinary retention 12/01/2020   Hypokalemia 12/01/2020   DDD (degenerative disc disease), lumbar 71/24/5809   Chronic systolic CHF (congestive heart failure) ([place]) 12/01/2020   Generalized weakness 11/30/2020   Spinal stenosis 11/28/2020   Morbid obesity ([place]) 05/24/2015   Arthritic-like pain 10/25/2014   Primary  hypertension 07/18/2014   Uncontrolled diabetes mellitus ([place]) 07/18/2014   Coronary atherosclerosis of native coronary artery 06/24/2013   Hx of myocardial infarction 06/21/2013   Hyperlipidemia 08/27/2010   PHYSICAL THERAPY DISCHARGE SUMMARY  Visits from Start of Care: 6.  Current functional level related to goals / functional outcomes: See above.   Remaining deficits: See below.   Education / Equipment: HEP.  Patient agrees to discharge. Patient goals were not met. Patient is being discharged due to not returning since the last visit.  APP[place], [place] MPT 01/28/2021, 10:50 AM  [facility] [address] [place], [place], [address] Phone: [phone]   Fax:  [phone]  Name: JAHMEZ BILY MRN: [id number] Date of Birth: November 13, 1955

## 2021-02-03 ENCOUNTER — Encounter: Payer: Medicare Other | Admitting: Physical Therapy

## 2021-02-05 ENCOUNTER — Other Ambulatory Visit: Payer: Self-pay | Admitting: Nurse Practitioner

## 2021-02-05 DIAGNOSIS — R338 Other retention of urine: Secondary | ICD-10-CM | POA: Diagnosis not present

## 2021-02-17 DIAGNOSIS — R338 Other retention of urine: Secondary | ICD-10-CM | POA: Diagnosis not present

## 2021-02-18 ENCOUNTER — Ambulatory Visit (INDEPENDENT_AMBULATORY_CARE_PROVIDER_SITE_OTHER): Payer: Medicare Other | Admitting: Nurse Practitioner

## 2021-02-18 ENCOUNTER — Other Ambulatory Visit: Payer: Self-pay

## 2021-02-18 ENCOUNTER — Encounter: Payer: Self-pay | Admitting: Nurse Practitioner

## 2021-02-18 VITALS — BP 138/82 | HR 66 | Temp 98.2°F | Resp 20 | Ht 69.0 in | Wt 224.0 lb

## 2021-02-18 DIAGNOSIS — E7800 Pure hypercholesterolemia, unspecified: Secondary | ICD-10-CM

## 2021-02-18 DIAGNOSIS — I10 Essential (primary) hypertension: Secondary | ICD-10-CM | POA: Diagnosis not present

## 2021-02-18 DIAGNOSIS — E1142 Type 2 diabetes mellitus with diabetic polyneuropathy: Secondary | ICD-10-CM

## 2021-02-18 DIAGNOSIS — Z794 Long term (current) use of insulin: Secondary | ICD-10-CM

## 2021-02-18 DIAGNOSIS — Z1212 Encounter for screening for malignant neoplasm of rectum: Secondary | ICD-10-CM

## 2021-02-18 DIAGNOSIS — I25111 Atherosclerotic heart disease of native coronary artery with angina pectoris with documented spasm: Secondary | ICD-10-CM

## 2021-02-18 DIAGNOSIS — E876 Hypokalemia: Secondary | ICD-10-CM | POA: Diagnosis not present

## 2021-02-18 DIAGNOSIS — Z1211 Encounter for screening for malignant neoplasm of colon: Secondary | ICD-10-CM

## 2021-02-18 DIAGNOSIS — Z23 Encounter for immunization: Secondary | ICD-10-CM

## 2021-02-18 DIAGNOSIS — I5022 Chronic systolic (congestive) heart failure: Secondary | ICD-10-CM

## 2021-02-18 DIAGNOSIS — Z6833 Body mass index (BMI) 33.0-33.9, adult: Secondary | ICD-10-CM

## 2021-02-18 DIAGNOSIS — Z125 Encounter for screening for malignant neoplasm of prostate: Secondary | ICD-10-CM

## 2021-02-18 DIAGNOSIS — E1165 Type 2 diabetes mellitus with hyperglycemia: Secondary | ICD-10-CM | POA: Diagnosis not present

## 2021-02-18 LAB — BAYER DCA HB A1C WAIVED: HB A1C (BAYER DCA - WAIVED): 6.2 % — ABNORMAL HIGH (ref 4.8–5.6)

## 2021-02-18 MED ORDER — JANUMET 50-1000 MG PO TABS: 1.0000 | ORAL_TABLET | Freq: Two times a day (BID) | ORAL | 1 refills | Status: DC

## 2021-02-18 MED ORDER — METOPROLOL TARTRATE 25 MG PO TABS: 25.0000 mg | ORAL_TABLET | Freq: Two times a day (BID) | ORAL | 1 refills | Status: DC

## 2021-02-18 MED ORDER — LISINOPRIL 20 MG PO TABS: 20.0000 mg | ORAL_TABLET | Freq: Every day | ORAL | 1 refills | Status: DC

## 2021-02-18 MED ORDER — TAMSULOSIN HCL 0.4 MG PO CAPS: 0.4000 mg | ORAL_CAPSULE | Freq: Every day | ORAL | Status: DC

## 2021-02-18 MED ORDER — AMLODIPINE BESYLATE 5 MG PO TABS: 5.0000 mg | ORAL_TABLET | Freq: Every day | ORAL | 1 refills | Status: DC

## 2021-02-18 MED ORDER — ROSUVASTATIN CALCIUM 40 MG PO TABS: 20.0000 mg | ORAL_TABLET | Freq: Every day | ORAL | 1 refills | Status: DC

## 2021-02-18 MED ORDER — LANTUS SOLOSTAR 100 UNIT/ML ~~LOC~~ SOPN: 60.0000 [IU] | PEN_INJECTOR | Freq: Every day | SUBCUTANEOUS | 3 refills | Status: DC

## 2021-02-18 MED ORDER — GLIMEPIRIDE 4 MG PO TABS: 4.0000 mg | ORAL_TABLET | Freq: Two times a day (BID) | ORAL | 1 refills | Status: DC

## 2021-02-18 NOTE — Progress Notes (Signed)
Subjective:    Patient ID: Tyler Garner, male    DOB: 1956/04/28, 65 y.o.   MRN: [id number]   Chief Complaint: Medical Management of Chronic Issues    HPI:  1. Primary hypertension No c/o chest pain, sob or headache. Does ont check blood pressure at home. BP Readings from Last 3 Encounters:  02/18/21 (!) 150/80  01/01/21 (!) 144/72  12/04/20 (!) 103/55     2. Chronic systolic CHF (congestive heart failure) (HCC) No edema or SOB. Has been several years since he was seen last.  3. Atherosclerosis of native coronary artery of native heart with angina pectoris with documented spasm ([place]) No problems that he is aware of.  4. Pure hypercholesterolemia Does not watch and does no exercise due to back pain. Lab Results  Component Value Date   CHOL 132 11/18/2020   HDL 22 (L) 11/18/2020   LDLCALC 61 11/18/2020   TRIG 313 (H) 11/18/2020   CHOLHDL 6.0 (H) 11/18/2020     5. Hypokalemia No c/o lower ext cramping Lab Results  Component Value Date   K 4.4 12/02/2020     6.  type 2 diabetes mellitus with lon term use of insulin (HCC) Fasting blood sugars are better. He does not check it every day. Has not been doing lantus daily. Suppose to be doing 60U bid. Lab Results  Component Value Date   HGBA1C 6.9 (H) 11/28/2020     7. Morbid obesity ([place]) No recent weight changes. Wt Readings from Last 3 Encounters:  02/18/21 224 lb (101.6 kg)  01/01/21 220 lb (99.8 kg)  12/03/20 223 lb 5.2 oz (101.3 kg)   BMI Readings from Last 3 Encounters:  02/18/21 33.08 kg/m  01/01/21 32.49 kg/m  12/03/20 32.98 kg/m     8. Prostate cancer screening Has been having problems voiding and is going to have a TURP when he is medicaly cleared. Lab Results  Component Value Date   PSA1 0.7 11/16/2019   PSA 0.68 12/05/2012        Outpatient Encounter Medications as of 02/18/2021  Medication Sig   Accu-Chek Softclix Lancets lancets Check blood sugar daily and prn  Dx E11.9    amLODipine (NORVASC) 5 MG tablet Take 1 tablet (5 mg total) by mouth daily.   Blood Glucose Monitoring Suppl (ACCU-CHEK GUIDE ME) w/Device KIT CHECK BLOOD SUGAR DAILY AND AS NEEDED DX E11.9   cyanocobalamin (,VITAMIN B-12,) 1000 MCG/ML injection Inject 1 mL (1,000 mcg total) into the muscle daily for 5 days, THEN 1 mL (1,000 mcg total) once a week for 30 days, THEN 1 mL (1,000 mcg total) every 30 (thirty) days.   glimepiride (AMARYL) 4 MG tablet Take 1 tablet (4 mg total) by mouth 2 (two) times daily.   glucose blood (ACCU-CHEK GUIDE) test strip Check blood sugar daily and prn  Dx E11.9   insulin glargine (LANTUS SOLOSTAR) 100 UNIT/ML Solostar Pen Inject 60 Units into the skin 2 (two) times daily.   Insulin Pen Needle (PEN NEEDLES) 31G X 8 MM MISC Use with lantus solosar pen daily   JANUMET 50-1000 MG tablet Take 1 tablet by mouth 2 (two) times daily.   lisinopril (ZESTRIL) 20 MG tablet Take 1 tablet (20 mg total) by mouth daily.   metoprolol tartrate (LOPRESSOR) 25 MG tablet Take 1 tablet (25 mg total) by mouth 2 (two) times daily.   polyethylene glycol (MIRALAX / GLYCOLAX) 17 g packet Take 17 g by mouth 2 (two) times daily as needed  for mild constipation.   rosuvastatin (CRESTOR) 40 MG tablet Take 0.5 tablets (20 mg total) by mouth daily.   senna (SENOKOT) 8.6 MG TABS tablet Take 2 tablets (17.2 mg total) by mouth in the morning and at bedtime for 5 days, THEN 2 tablets (17.2 mg total) 2 (two) times daily as needed for moderate constipation.   tamsulosin (FLOMAX) 0.4 MG CAPS capsule Take 1 capsule (0.4 mg total) by mouth daily.   No facility-administered encounter medications on file as of 02/18/2021.    Past Surgical History:  Procedure Laterality Date    heart stent     LEFT HEART CATHETERIZATION WITH CORONARY ANGIOGRAM N/A 06/21/2013   Procedure: LEFT HEART CATHETERIZATION WITH CORONARY ANGIOGRAM;  Surgeon: Peter M Martinique, MD;  Location: [facility] CATH LAB;  Service: Cardiovascular;  Laterality:  N/A;   LUMBAR LAMINECTOMY/DECOMPRESSION MICRODISCECTOMY N/A 11/28/2020   Procedure: LUMBAR DECOMPRESSION  2 LEVELS, L3 and L5;  Surgeon: Melina Schools, MD;  Location: [place];  Service: Orthopedics;  Laterality: N/A;  3.5 hrs    Family History  Problem Relation Age of Onset   Diabetes Mother    ALS Mother    Alzheimer's disease Father    Healthy Sister    Diabetes Brother     New complaints: None today  Social history: His son lives with him  Controlled substance contract: n/a     Review of Systems  Constitutional:  Negative for diaphoresis.  Eyes:  Negative for pain.  Respiratory:  Negative for shortness of breath.   Cardiovascular:  Negative for chest pain, palpitations and leg swelling.  Gastrointestinal:  Negative for abdominal pain.  Endocrine: Negative for polydipsia.  Skin:  Negative for rash.  Neurological:  Negative for dizziness, weakness and headaches.  Hematological:  Does not bruise/bleed easily.  All other systems reviewed and are negative.     Objective:   Physical Exam Vitals and nursing note reviewed.  Constitutional:      Appearance: Normal appearance. He is well-developed.  HENT:     Head: Normocephalic.     Nose: Nose normal.  Eyes:     Pupils: Pupils are equal, round, and reactive to light.  Neck:     Thyroid: No thyroid mass or thyromegaly.     Vascular: No carotid bruit or JVD.     Trachea: Phonation normal.  Cardiovascular:     Rate and Rhythm: Normal rate and regular rhythm.  Pulmonary:     Effort: Pulmonary effort is normal. No respiratory distress.     Breath sounds: Normal breath sounds.  Abdominal:     General: Bowel sounds are normal.     Palpations: Abdomen is soft.     Tenderness: There is no abdominal tenderness.  Genitourinary:    Comments:  Foley catheter in place Musculoskeletal:        General: Normal range of motion.     Cervical back: Normal range of motion and neck supple.     Comments: Walking with a cane   Lymphadenopathy:     Cervical: No cervical adenopathy.  Skin:    General: Skin is warm and dry.  Neurological:     Mental Status: He is alert and oriented to person, place, and time.  Psychiatric:        Behavior: Behavior normal.        Thought Content: Thought content normal.        Judgment: Judgment normal.    BP 138/82   Pulse 66   Temp 98.2  F (36.8 C) (Temporal)   Resp 20   Ht _0  (1.753 m)   Wt 224 lb (101.6 kg)   SpO2 100%   BMI 33.08 kg/m   Hgba1c 6.2%       Assessment & Plan:  Tyler Garner comes in today with chief complaint of Medical Management of Chronic Issues   Diagnosis and orders addressed:  1. Primary hypertension Low sodium diet - CBC with Differential/Platelet - CMP14+EGFR - amLODipine (NORVASC) 5 MG tablet; Take 1 tablet (5 mg total) by mouth daily.  Dispense: 90 tablet; Refill: 1 - lisinopril (ZESTRIL) 20 MG tablet; Take 1 tablet (20 mg total) by mouth daily.  Dispense: 90 tablet; Refill: 1  2. Chronic systolic CHF (congestive heart failure) (HCC) Will follo wup with cariology as needed  3. Atherosclerosis of native coronary artery of native heart with angina pectoris with documented spasm (HCC) - metoprolol tartrate (LOPRESSOR) 25 MG tablet; Take 1 tablet (25 mg total) by mouth 2 (two) times daily.  Dispense: 180 tablet; Refill: 1  4. Pure hypercholesterolemia Low fat diet - Lipid panel - rosuvastatin (CRESTOR) 40 MG tablet; Take 0.5 tablets (20 mg total) by mouth daily.  Dispense: 135 tablet; Refill: 1  5. Hypokalemia Labs pending  6. Type 2 diabetes mellitus with diabetic polyneuropathy, with long-term current use of insulin (HCC) Continue to watch carbs on diet Lantus 60u daily Keep diary of blood sugars and report if blood sugar going up - Bayer DCA Hb A1c Waived - insulin glargine (LANTUS SOLOSTAR) 100 UNIT/ML Solostar Pen; Inject 60 Units into the skin at bedtime.  Dispense: 30 mL; Refill: 3 - glimepiride (AMARYL) 4 MG  tablet; Take 1 tablet (4 mg total) by mouth 2 (two) times daily.  Dispense: 180 tablet; Refill: 1 - JANUMET 50-1000 MG tablet; Take 1 tablet by mouth 2 (two) times daily.  Dispense: 180 tablet; Refill: 1   7. BMI 33.0-33.9,adult Discussed diet and exercise for person with BMI >25 Will recheck weight in 3-6 months   8. Prostate cancer screening - PSA, total and free - tamsulosin (FLOMAX) 0.4 MG CAPS capsule; Take 1 capsule (0.4 mg total) by mouth daily.  Dispense: 30 capsule   Labs pending Health Maintenance reviewed- ordered cologuard Diet and exercise encouraged  Follow up plan: 3 months   [place], [name]

## 2021-02-18 NOTE — Patient Instructions (Signed)
  Cologuard  Your provider has prescribed Cologuard, an easy-to-use, noninvasive test for colon cancer screening, based on the latest advances in stool DNA science.   Here's what will happen next:  1. You may receive a call or email from [employer] to confirm your mailing address and insurance information 2. Your kit will be shipped directly to you 3. You collect your stool sample in the privacy of your own home. Please follow the instructions that come with the kit 4. You return the kit via [employer] shipping or pick-up, in the same box it arrived in 5. You should receive a call with the results once they are available. If you do not receive a call, please contact our office at [phone]  Insurance Coverage  Cologuard is covered by Medicare and most major insurers Cologuard is covered by Medicare and Medicare Advantage with no co-pay or deductible for eligible patients ages 32-85. [employer], more than 94% of Cologuard patients have no out-of-pocket cost for screening. Based on the [place] should be covered by most private insurers with no co-pay or deductible for eligible patients (ages 70-75; at average risk for colon cancer; without symptoms). Currently, ~74% of Cologuard patients 45-49 have had no out-of-pocket cost for screening.  Many national and regional payers have begun paying for CRC screening at 98. [employer] continues to work with payers to expand coverage and access for patients ages 75-49.   Only your healthcare insurance provider can confirm how Cologuard will be covered for you. If you have questions about coverage, you can contact your insurance company directly or ask the specialists at [employer] to do that for you. A Customer Support Specialist can be reached at [phone].    Patient Support Screening for colon cancer is very important to your good health, so if you have any questions at all, please call [employer]  Customer Support Specialists at [phone]. They are available 24 hours a day, 6 days a week. An instructional video is available to view online at [email or web address]   *Information and graphics obtained from [email or web address]

## 2021-02-19 LAB — CMP14+EGFR
ALT: 10 IU/L (ref 0–44)
AST: 18 IU/L (ref 0–40)
Albumin/Globulin Ratio: 2.1 (ref 1.2–2.2)
Albumin: 4.8 g/dL (ref 3.8–4.8)
Alkaline Phosphatase: 62 IU/L (ref 44–121)
BUN/Creatinine Ratio: 14 (ref 10–24)
BUN: 11 mg/dL (ref 8–27)
Bilirubin Total: 0.3 mg/dL (ref 0.0–1.2)
CO2: 23 mmol/L (ref 20–29)
Calcium: 9.9 mg/dL (ref 8.6–10.2)
Chloride: 102 mmol/L (ref 96–106)
Creatinine, Ser: 0.79 mg/dL (ref 0.76–1.27)
Globulin, Total: 2.3 g/dL (ref 1.5–4.5)
Glucose: 165 mg/dL — ABNORMAL HIGH (ref 70–99)
Potassium: 5.1 mmol/L (ref 3.5–5.2)
Sodium: 143 mmol/L (ref 134–144)
Total Protein: 7.1 g/dL (ref 6.0–8.5)
eGFR: 98 mL/min/{1.73_m2} (ref >59)

## 2021-02-19 LAB — CBC WITH DIFFERENTIAL/PLATELET
Basophils Absolute: 0.1 10*3/uL (ref 0.0–0.2)
Basos: 1 %
EOS (ABSOLUTE): 0.2 10*3/uL (ref 0.0–0.4)
Eos: 2 %
Hematocrit: 39.7 % (ref 37.5–51.0)
Hemoglobin: 13.3 g/dL (ref 13.0–17.7)
Immature Grans (Abs): 0 10*3/uL (ref 0.0–0.1)
Immature Granulocytes: 0 %
Lymphocytes Absolute: 3.9 10*3/uL — ABNORMAL HIGH (ref 0.7–3.1)
Lymphs: 47 %
MCH: 27 pg (ref 26.6–33.0)
MCHC: 33.5 g/dL (ref 31.5–35.7)
MCV: 81 fL (ref 79–97)
Monocytes Absolute: 0.6 10*3/uL (ref 0.1–0.9)
Monocytes: 7 %
Neutrophils Absolute: 3.5 10*3/uL (ref 1.4–7.0)
Neutrophils: 43 %
Platelets: 279 10*3/uL (ref 150–450)
RBC: 4.92 x10E6/uL (ref 4.14–5.80)
RDW: 14.2 % (ref 11.6–15.4)
WBC: 8.3 10*3/uL (ref 3.4–10.8)

## 2021-02-19 LAB — LIPID PANEL
Chol/HDL Ratio: 5.9 ratio — ABNORMAL HIGH (ref 0.0–5.0)
Cholesterol, Total: 135 mg/dL (ref 100–199)
HDL: 23 mg/dL — ABNORMAL LOW (ref >39)
LDL Chol Calc (NIH): 65 mg/dL (ref 0–99)
Triglycerides: 295 mg/dL — ABNORMAL HIGH (ref 0–149)
VLDL Cholesterol Cal: 47 mg/dL — ABNORMAL HIGH (ref 5–40)

## 2021-02-19 LAB — PSA, TOTAL AND FREE
PSA, Free Pct: 28.9 %
PSA, Free: 0.26 ng/mL
Prostate Specific Ag, Serum: 0.9 ng/mL (ref 0.0–4.0)

## 2021-02-24 ENCOUNTER — Other Ambulatory Visit: Payer: Self-pay | Admitting: Urology

## 2021-03-17 ENCOUNTER — Encounter: Payer: Self-pay | Admitting: Cardiovascular Disease

## 2021-03-17 ENCOUNTER — Other Ambulatory Visit: Payer: Self-pay

## 2021-03-17 ENCOUNTER — Ambulatory Visit (INDEPENDENT_AMBULATORY_CARE_PROVIDER_SITE_OTHER): Payer: Medicare Other | Admitting: Cardiovascular Disease

## 2021-03-17 VITALS — BP 140/76 | HR 68 | Ht 69.0 in | Wt 225.2 lb

## 2021-03-17 DIAGNOSIS — I2510 Atherosclerotic heart disease of native coronary artery without angina pectoris: Secondary | ICD-10-CM | POA: Diagnosis not present

## 2021-03-17 DIAGNOSIS — I10 Essential (primary) hypertension: Secondary | ICD-10-CM | POA: Diagnosis not present

## 2021-03-17 DIAGNOSIS — Z01818 Encounter for other preprocedural examination: Secondary | ICD-10-CM

## 2021-03-17 DIAGNOSIS — E782 Mixed hyperlipidemia: Secondary | ICD-10-CM | POA: Diagnosis not present

## 2021-03-17 DIAGNOSIS — Z01810 Encounter for preprocedural cardiovascular examination: Secondary | ICD-10-CM

## 2021-03-17 DIAGNOSIS — I252 Old myocardial infarction: Secondary | ICD-10-CM

## 2021-03-17 DIAGNOSIS — R600 Localized edema: Secondary | ICD-10-CM | POA: Diagnosis not present

## 2021-03-17 MED ORDER — OMEGA-3-ACID ETHYL ESTERS 1 G PO CAPS: 1.0000 g | ORAL_CAPSULE | Freq: Two times a day (BID) | ORAL | 3 refills | Status: DC

## 2021-03-17 MED ORDER — HYDROCHLOROTHIAZIDE 12.5 MG PO CAPS: 12.5000 mg | ORAL_CAPSULE | Freq: Every day | ORAL | 3 refills | Status: DC

## 2021-03-17 NOTE — Progress Notes (Signed)
Cardiology Office Note    Date:  03/19/2021   ID:  Tyler Garner, DOB December 28, 1955, MRN [id number]  PCP:  Chevis Pretty, [name]  Cardiologist:  Shelva Majestic, MD   New cardiology evaluation referred through the courtesy of Dr. Rexene Alberts for preoperative clearance prior to undergoing transurethral resection of the prostate   History of Present Illness:  Tyler Garner is a 65 y.o. male who has known CAD and suffered a myocardial infarction in January 2015 at which time a drug-eluting stent was placed in his left circumflex coronary artery.  He was evaluated by Dr. Percival Spanish in August 2017.  A treadmill test in 2016 was negative for ischemia.  When seen in 2017 he was stable without cardiovascular complaints.  He has a history of hyperlipidemia as well as diabetes mellitus with long-term use of insulin.  He was recently evaluated by Chevis Pretty on February 18, 2021 and at that time blood pressure was 138/82 and pulse was 66.  Laboratory from June 2022 showed a total cholesterol 132, low HDL at 22, LDL of 61 but triglycerides were significantly increased at 313.  Hemoglobin A1c in July 2022 was 6.9.  The patient states that he underwent back surgery in late July or early August and since that time he has not been able to urinate.  As result, he has been having to use a bag.  He has been evaluated by Dr. Abner Greenspan of [facility] and is scheduled to undergo transurethral resection of the prostate (TURP)/Bipolar on March 28, 2021.  He denies recent chest pain.  He admits to lower extremity edema bilaterally.  He has not been able to sleep well secondary to his back pain.  He presents for preoperative evaluation and clearance.  Past Medical History:  Diagnosis Date   Arthritis    CAD (coronary artery disease)    a. inferior STEMI (05/2013) with RV involvement- LHC:  dist CFX occluded (Promus premier (2.5x24 mm) DES); EF 40-45% with inf HK;  b. Echo (report pending) with EF 45-50%, lat  WMA, RVE, mod RV hypokinesis    Diabetes mellitus without complication ([place])    Hyperglycemia    Hyperlipidemia    Hypertension    Ischemic cardiomyopathy    Myocardial infarction ([place]) 2015   Nicotine addiction    Renal colic    Right shoulder injury 10/11/2009    Past Surgical History:  Procedure Laterality Date    heart stent     LEFT HEART CATHETERIZATION WITH CORONARY ANGIOGRAM N/A 06/21/2013   Procedure: LEFT HEART CATHETERIZATION WITH CORONARY ANGIOGRAM;  Surgeon: Peter M Martinique, MD;  Location: [facility] CATH LAB;  Service: Cardiovascular;  Laterality: N/A;   LUMBAR LAMINECTOMY/DECOMPRESSION MICRODISCECTOMY N/A 11/28/2020   Procedure: LUMBAR DECOMPRESSION  2 LEVELS, L3 and L5;  Surgeon: Melina Schools, MD;  Location: [place];  Service: Orthopedics;  Laterality: N/A;  3.5 hrs    Current Medications: Outpatient Medications Prior to Visit  Medication Sig Dispense Refill   Accu-Chek Softclix Lancets lancets Check blood sugar daily and prn  Dx E11.9 100 each 3   amLODipine (NORVASC) 5 MG tablet Take 1 tablet (5 mg total) by mouth daily. 90 tablet 1   Blood Glucose Monitoring Suppl (ACCU-CHEK GUIDE ME) w/Device KIT CHECK BLOOD SUGAR DAILY AND AS NEEDED DX E11.9 1 kit 0   glimepiride (AMARYL) 4 MG tablet Take 1 tablet (4 mg total) by mouth 2 (two) times daily. 180 tablet 1   glucose blood (ACCU-CHEK GUIDE) test  strip Check blood sugar daily and prn  Dx E11.9 100 each 3   insulin glargine (LANTUS SOLOSTAR) 100 UNIT/ML Solostar Pen Inject 60 Units into the skin at bedtime. 30 mL 3   Insulin Pen Needle (PEN NEEDLES) 31G X 8 MM MISC Use with lantus solosar pen daily 100 each 2   JANUMET 50-1000 MG tablet Take 1 tablet by mouth 2 (two) times daily. 180 tablet 1   lisinopril (ZESTRIL) 20 MG tablet Take 1 tablet (20 mg total) by mouth daily. 90 tablet 1   metoprolol tartrate (LOPRESSOR) 25 MG tablet Take 1 tablet (25 mg total) by mouth 2 (two) times daily. 180 tablet 1   rosuvastatin (CRESTOR) 40 MG  tablet Take 0.5 tablets (20 mg total) by mouth daily. 135 tablet 1   tamsulosin (FLOMAX) 0.4 MG CAPS capsule Take 1 capsule (0.4 mg total) by mouth daily. 30 capsule    cyanocobalamin (,VITAMIN B-12,) 1000 MCG/ML injection Inject 1 mL (1,000 mcg total) into the muscle daily for 5 days, THEN 1 mL (1,000 mcg total) once a week for 30 days, THEN 1 mL (1,000 mcg total) every 30 (thirty) days. (Patient not taking: Reported on 03/17/2021) 21 mL 0   polyethylene glycol (MIRALAX / GLYCOLAX) 17 g packet Take 17 g by mouth 2 (two) times daily as needed for mild constipation. (Patient not taking: Reported on 03/17/2021) 14 each 0   No facility-administered medications prior to visit.     Allergies:   Percocet [oxycodone-acetaminophen]   Social History   Socioeconomic History   Marital status: Widowed    Spouse name: Not on file   Number of children: Not on file   Years of education: Not on file   Highest education level: Not on file  Occupational History   Occupation: Retired  Tobacco Use   Smoking status: Former   Smokeless tobacco: Former  Scientific laboratory technician Use: Never used  Substance and Sexual Activity   Alcohol use: Yes    Comment: Occasional Beer per patient   Drug use: No   Sexual activity: Not on file  Other Topics Concern   Not on file  Social History Narrative   Not on file   Social Determinants of Health   Financial Resource Strain: Not on file  Food Insecurity: Not on file  Transportation Needs: Not on file  Physical Activity: Not on file  Stress: Not on file  Social Connections: Not on file    Socially he is widowed for 8 years.  He has 2 children and 3 grandchildren.  He previously worked as a Administrator for [employer].  He is retired since age 49.  He completed 12th grade of education.  There is remote tobacco history but he quit smoking in 2010.  He had walked usually for 30 minutes 7 days/week.  Family History:  The patient's family history includes ALS in his  mother; Alzheimer's disease in his father; Diabetes in his brother and mother; Healthy in his sister.   His mother died at age 10 and had ALS and diabetes.  Father died at age 53 and had a stroke.  He has 2 brothers ages 42 and 8 with diabetes and 1 sister age 5.  ROS General: Negative; No fevers, chills, or night sweats;  HEENT: Negative; No changes in vision or hearing, sinus congestion, difficulty swallowing Pulmonary: Negative; No cough, wheezing, shortness of breath, hemoptysis Cardiovascular: Negative; No chest pain, presyncope, syncope, palpitations GI: Negative; No nausea, vomiting,  diarrhea, or abdominal pain GU: Inability to have spontaneous urination since his back surgery. Hematologic/Oncology: Negative; no easy bruising, bleeding Endocrine: Negative; no heat/cold intolerance; no diabetes Neuro: Negative; no changes in balance, headaches Skin: Negative; No rashes or skin lesions Psychiatric: Negative; No behavioral problems, depression Sleep: Poor sleep secondary to back pain, daytime sleepiness, hypersomnolence, bruxism, restless legs, hypnogognic hallucinations, no cataplexy Other comprehensive 14 point system review is negative.   PHYSICAL EXAM:   VS:  BP 140/76 (BP Location: Left Arm, Patient Position: Sitting, Cuff Size: Large)   Pulse 68   Ht 5' 9"  (1.753 m)   Wt 225 lb 3.2 oz (102.2 kg)   SpO2 98%   BMI 33.26 kg/m     Repeat blood pressure by me was 142/70  Wt Readings from Last 3 Encounters:  03/19/21 225 lb (102.1 kg)  03/17/21 225 lb 3.2 oz (102.2 kg)  02/18/21 224 lb (101.6 kg)    General: Alert, oriented, no distress.  Skin: normal turgor, no rashes, warm and dry HEENT: Normocephalic, atraumatic. Pupils equal round and reactive to light; sclera anicteric; extraocular muscles intact; Nose without nasal septal hypertrophy Mouth/Parynx benign; Mallinpatti scale 3/4 Neck: No JVD, no carotid bruits; normal carotid upstroke Lungs: clear to ausculatation  and percussion; no wheezing or rales Chest wall: without tenderness to palpitation Heart: PMI not displaced, RRR, s1 s2 normal, 1/6 systolic murmur, no diastolic murmur, no rubs, gallops, thrills, or heaves Abdomen: soft, nontender; no hepatosplenomehaly, BS+; abdominal aorta nontender and not dilated by palpation. Back: no CVA tenderness Pulses 2+ Musculoskeletal: full range of motion, normal strength, no joint deformities Extremities: 1+ pitting edema bilaterally lower extremities; no clubbing, cyanosis or edema, Homan's sign negative  Neurologic: grossly nonfocal; Cranial nerves grossly wnl Psychologic: Normal mood and affect   Studies/Labs Reviewed:   EKG:  EKG is ordered today.  ECG (independently read by me):  NSR at 1, old inferior MI with Q III, aVF  Recent Labs: BMP Latest Ref Rng & Units 02/18/2021 12/03/2020 12/02/2020  Glucose 70 - 99 mg/dL 165(H) 430(H) 185(H)  BUN 8 - 27 mg/dL 11 - 14  Creatinine 0.76 - 1.27 mg/dL 0.79 - 0.71  BUN/Creat Ratio 10 - 24 14 - -  Sodium 134 - 144 mmol/L 143 - 139  Potassium 3.5 - 5.2 mmol/L 5.1 - 4.4  Chloride 96 - 106 mmol/L 102 - 100  CO2 20 - 29 mmol/L 23 - 30  Calcium 8.6 - 10.2 mg/dL 9.9 - 9.4     Hepatic Function Latest Ref Rng & Units 02/18/2021 12/02/2020 12/01/2020  Total Protein 6.0 - 8.5 g/dL 7.1 - 6.7  Albumin 3.8 - 4.8 g/dL 4.8 3.5 3.6  AST 0 - 40 IU/L 18 - 25  ALT 0 - 44 IU/L 10 - 19  Alk Phosphatase 44 - 121 IU/L 62 - 39  Total Bilirubin 0.0 - 1.2 mg/dL 0.3 - 0.8    CBC Latest Ref Rng & Units 02/18/2021 12/02/2020 12/01/2020  WBC 3.4 - 10.8 x10E3/uL 8.3 7.1 7.1  Hemoglobin 13.0 - 17.7 g/dL 13.3 10.8(L) 10.1(L)  Hematocrit 37.5 - 51.0 % 39.7 32.8(L) 31.4(L)  Platelets 150 - 450 x10E3/uL 279 258 223   Lab Results  Component Value Date   MCV 81 02/18/2021   MCV 90.1 12/02/2020   MCV 92.6 12/01/2020   Lab Results  Component Value Date   TSH 1.577 12/01/2020   Lab Results  Component Value Date   HGBA1C 6.2 (H)  02/18/2021  BNP    Component Value Date/Time   BNP 69.4 12/01/2020 1248    ProBNP    Component Value Date/Time   PROBNP 537.1 (H) 06/23/2013 0213     Lipid Panel     Component Value Date/Time   CHOL 135 02/18/2021 0928   CHOL 167 11/11/2012 0806   TRIG 295 (H) 02/18/2021 0928   TRIG 411 (H) 10/25/2014 0810   TRIG 339 (H) 11/11/2012 0806   HDL 23 (L) 02/18/2021 0928   HDL 17 (L) 10/25/2014 0810   HDL 23 (L) 11/11/2012 0806   CHOLHDL 5.9 (H) 02/18/2021 0928   CHOLHDL 6.8 06/23/2013 0213   VLDL 44 (H) 06/23/2013 0213   LDLCALC 65 02/18/2021 0928   LDLCALC 105 (H) 12/25/2013 0814   LDLCALC 76 11/11/2012 0806   LABVLDL 47 (H) 02/18/2021 0928     RADIOLOGY: MYOCARDIAL PERFUSION IMAGING  Result Date: 03/19/2021   Findings are consistent with prior myocardial infarction and no prior ischemia. The study is intermediate risk due to prior infarct and reduced systolic function.   No ST deviation was noted.   LV perfusion is abnormal. There is no evidence of ischemia. There is evidence of infarction. Defect 1: There is a small defect with moderate reduction in uptake present in the mid to basal inferoseptal location(s) that is fixed. There is abnormal wall motion in the defect area. Consistent with infarction. Defect 2: There is a medium defect with severe reduction in uptake present in the apical to basal inferior location(s) that is fixed. There is abnormal wall motion in the defect area. Consistent with infarction.   Left ventricular function is abnormal. Nuclear stress EF: 41 %. The left ventricular ejection fraction is moderately decreased (30-44%). End diastolic cavity size is moderately enlarged. End systolic cavity size is mildly enlarged.   Prior study not available for comparison. Prior inferior infarct with no evidence of ischemia.     Additional studies/ records that were reviewed today include:  I reviewed the records of Dr. Percival Spanish from 2017 and Dr. Chevis Pretty    ASSESSMENT:    1. Atherosclerosis of native coronary artery of native heart without angina pectoris   2. Old MI (myocardial infarction): January 2015, DES stent to left circumflex vessel   3. Primary hypertension   4. Preoperative clearance   5. Mixed hyperlipidemia   6. Bilateral lower extremity edema     PLAN:  Mr. Maalik Pinn is a 65 year old gentleman who has a history of mild obesity, hypertension, type 2 diabetes mellitus and significant mixed hyperlipidemia.  He has been on amlodipine 5 mg in addition to lisinopril 20 mg and metoprolol tartrate 25 mg twice a day.  His blood pressure today is elevated and on repeat by me was 142/70.  He has 1+ lower extremity edema below the knee.  With his blood pressure elevation I have recommending the addition of HCTZ at 12.5 mg daily to his medical regimen.  I was going to start him on spironolactone 12.5 mg however with a recent laboratory showing upper normal potassium at 5.1, I elected to start HCTZ.  In its place.  His ECG shows evidence for old inferior infarction and he status post prior MI with intervention to his left circumflex coronary artery in January 2015.  He has been without recurrent anginal symptomatology.  He is scheduled to undergo TURP surgery next week.  I have recommended he undergo an echo Doppler study to evaluate systolic and diastolic function with his history of  prior myocardial infarction.  In addition I also have recommended he undergo a [place] study to assess for potential ischemia and preoperative risk prior to his surgery.  We will expedite the myocardial perfusion study to be done this week.  I will contact him regarding results of both studies and if not high risk he will be given clearance for his planned urologic surgery.  Recent laboratory has shown significant hypertriglyceridemia.  He is now on rosuvastatin 40 mg and may benefit from the addition of Vascepa 2 capsules twice a day.  However I will  not start this prior to his surgery and this can be started electively following his surgery.  He will monitor his blood pressure and leg edema.  I will see him in 2 to 3 months for follow-up evaluation and further recommendations will be made at that time.   Medication Adjustments/Labs and Tests Ordered: Current medicines are reviewed at length with the patient today.  Concerns regarding medicines are outlined above.  Medication changes, Labs and Tests ordered today are listed in the Patient Instructions below. Patient Instructions  Medication Instructions:  HCTZ 12.5 MG ONCE A DAY lOVAZA 1 GRAM TWICE A DAY ( 2 CAPSULES).  START AFTER YOU RECOVER FROM SURGERY *If you need a refill on your cardiac medications before your next appointment, please call your pharmacy*   Testing/Procedures: Your physician has requested that you have an echocardiogram. Echocardiography is a painless test that uses sound waves to create images of your heart. It provides your doctor with information about the size and shape of your heart and how well your heart's chambers and valves are working. This procedure takes approximately one hour. There are no restrictions for this procedure. [facility] has requested that you have a lexiscan myoview. For further information please visit [email or web address]. Please follow instruction sheet, as given.   Follow-Up: At [facility], you and your health needs are our priority.  As part of our continuing mission to provide you with exceptional heart care, we have created designated Provider Care Teams.  These Care Teams include your primary Cardiologist (physician) and Advanced Practice Providers (APPs -  Physician Assistants and Nurse Practitioners) who all work together to provide you with the care you need, when you need it.  We recommend signing up for the patient portal called "MyChart".  Sign up information is provided on this After  Visit Summary.  MyChart is used to connect with patients for Virtual Visits (Telemedicine).  Patients are able to view lab/test results, encounter notes, upcoming appointments, etc.  Non-urgent messages can be sent to your provider as well.   To learn more about what you can do with MyChart, go to [email or web address].    Your next appointment:   3 month(s)  The format for your next appointment:   In Person  Provider:   Shelva Majestic, MD     Signed, Shelva Majestic, MD  03/19/2021 8:48 PM    [facility] [address] [place], [place], [place]  [address] Phone: [phone]

## 2021-03-17 NOTE — Patient Instructions (Signed)
Medication Instructions:  HCTZ 12.5 MG ONCE A DAY lOVAZA 1 GRAM TWICE A DAY ( 2 CAPSULES).  START AFTER YOU RECOVER FROM SURGERY *If you need a refill on your cardiac medications before your next appointment, please call your pharmacy*   Testing/Procedures: Your physician has requested that you have an echocardiogram. Echocardiography is a painless test that uses sound waves to create images of your heart. It provides your doctor with information about the size and shape of your heart and how well your heart's chambers and valves are working. This procedure takes approximately one hour. There are no restrictions for this procedure. [address]   Your physician has requested that you have a lexiscan myoview. For further information please visit [email or web address] Please follow instruction sheet, as given.   Follow-Up: At [facility], you and your health needs are our priority.  As part of our continuing mission to provide you with exceptional heart care, we have created designated Provider Care Teams.  These Care Teams include your primary Cardiologist (physician) and Advanced Practice Providers (APPs -  Physician Assistants and Nurse Practitioners) who all work together to provide you with the care you need, when you need it.  We recommend signing up for the patient portal called "MyChart".  Sign up information is provided on this After Visit Summary.  MyChart is used to connect with patients for Virtual Visits (Telemedicine).  Patients are able to view lab/test results, encounter notes, upcoming appointments, etc.  Non-urgent messages can be sent to your provider as well.   To learn more about what you can do with MyChart, go to [email or web address].    Your next appointment:   3 month(s)  The format for your next appointment:   In Person  Provider:   Nicki Guadalajara, MD

## 2021-03-18 ENCOUNTER — Telehealth (HOSPITAL_COMMUNITY): Payer: Self-pay | Admitting: *Deleted

## 2021-03-18 DIAGNOSIS — R338 Other retention of urine: Secondary | ICD-10-CM | POA: Diagnosis not present

## 2021-03-18 NOTE — Telephone Encounter (Signed)
Close encounter 

## 2021-03-19 ENCOUNTER — Other Ambulatory Visit: Payer: Self-pay

## 2021-03-19 ENCOUNTER — Ambulatory Visit (HOSPITAL_COMMUNITY)
Admission: RE | Admit: 2021-03-19 | Discharge: 2021-03-19 | Disposition: A | Discharge status: Home / Self Care | Payer: Medicare Other | Source: Ambulatory Visit | Attending: Cardiology | Admitting: Cardiology

## 2021-03-19 ENCOUNTER — Encounter: Payer: Self-pay | Admitting: Cardiovascular Disease

## 2021-03-19 DIAGNOSIS — Z01810 Encounter for preprocedural cardiovascular examination: Secondary | ICD-10-CM | POA: Diagnosis not present

## 2021-03-19 DIAGNOSIS — I2510 Atherosclerotic heart disease of native coronary artery without angina pectoris: Secondary | ICD-10-CM | POA: Diagnosis not present

## 2021-03-19 LAB — MYOCARDIAL PERFUSION IMAGING
LV dias vol: 161 mL (ref 62–150)
LV sys vol: 96 mL
Nuc Stress EF: 41 %
Peak HR: 92 {beats}/min
Rest HR: 68 {beats}/min
Rest Nuclear Isotope Dose: 10.9 mCi
SDS: 1
SRS: 14
SSS: 15
ST Depression (mm): 0 mm
Stress Nuclear Isotope Dose: 30.3 mCi
TID: 1.11

## 2021-03-19 MED ORDER — TECHNETIUM TC 99M TETROFOSMIN IV KIT
30.3000 | PACK | Freq: Once | INTRAVENOUS | Status: AC | PRN
  Administered 2021-03-19: 30.3 via INTRAVENOUS
  Filled 2021-03-19: qty 31

## 2021-03-19 MED ORDER — TECHNETIUM TC 99M TETROFOSMIN IV KIT
10.9000 | PACK | Freq: Once | INTRAVENOUS | Status: AC | PRN
  Administered 2021-03-19: 10.9 via INTRAVENOUS
  Filled 2021-03-19: qty 11

## 2021-03-19 MED ORDER — REGADENOSON 0.4 MG/5ML IV SOLN
0.4000 mg | Freq: Once | INTRAVENOUS | Status: AC
  Administered 2021-03-19: 0.4 mg via INTRAVENOUS

## 2021-03-21 ENCOUNTER — Other Ambulatory Visit: Payer: Self-pay

## 2021-03-21 ENCOUNTER — Ambulatory Visit (INDEPENDENT_AMBULATORY_CARE_PROVIDER_SITE_OTHER): Payer: Medicare Other

## 2021-03-21 ENCOUNTER — Other Ambulatory Visit: Payer: Self-pay | Admitting: Urology

## 2021-03-21 DIAGNOSIS — Z01810 Encounter for preprocedural cardiovascular examination: Secondary | ICD-10-CM | POA: Diagnosis not present

## 2021-03-21 DIAGNOSIS — Z01818 Encounter for other preprocedural examination: Secondary | ICD-10-CM

## 2021-03-21 DIAGNOSIS — I2510 Atherosclerotic heart disease of native coronary artery without angina pectoris: Secondary | ICD-10-CM | POA: Diagnosis not present

## 2021-03-21 LAB — ECHOCARDIOGRAM COMPLETE
AR max vel: 3.08 cm2
AV Area VTI: 2.73 cm2
AV Area mean vel: 2.57 cm2
AV Mean grad: 8.3 mmHg
AV Peak grad: 13.5 mmHg
Ao pk vel: 1.83 m/s
Area-P 1/2: 3.72 cm2
S' Lateral: 4.04 cm

## 2021-03-21 LAB — SARS CORONAVIRUS 2 (TAT 6-24 HRS): SARS Coronavirus 2: NEGATIVE

## 2021-03-25 ENCOUNTER — Other Ambulatory Visit: Payer: Self-pay

## 2021-03-25 ENCOUNTER — Encounter (HOSPITAL_BASED_OUTPATIENT_CLINIC_OR_DEPARTMENT_OTHER): Payer: Self-pay | Admitting: Urology

## 2021-03-25 NOTE — Progress Notes (Signed)
Spoke w/ via phone for pre-op interview---pt Lab needs dos----  I stat              Lab results------see below COVID test -----Pt had negative covid test 03-21-2021 no covid test needed for 03-28-2021 surgery per [facility] taavon Arrive at -------700 am 03-28-2021 NPO after MN NO Solid Food.  Clear liquids from MN until---600 am Med rec completed Medications to take morning of surgery -----amlodipine, metoprolol tartrate, rosuvastatin, tamsulosin Diabetic medication -----none day of surgery Patient instructed no nail polish to be worn day of surgery Patient instructed to bring photo id and insurance card day of surgery Patient aware to have Driver (ride ) / caregiver    for 24 hours after surgery  Patient Special Instructions -----pt given overnight stay instructions Pre-Op special Istructions ----- Patient verbalized understanding of instructions that were given at this phone interview. Patient denies shortness of breath, chest pain, fever, cough at this phone interview.   Anesthesia Review: hx of htn, ptca and stent to distal LCX, mi ( 06-21-2013), htn,  dm type 2, cardiac clearance note dr Nicki Guadalajara on echo report dated 03-23-2021 chart/epic, pt states no cardiac s & s or sob at pre op call  PCP: Paulene Floor np lov 11-18-2020 epic Cardiologist :dr Marland Mcalpine 03-21-2021 chart/epic Chest x-ray :11-18-2020 1 view chart/epic EKG :03-18-2021 chart/epic Echo :03-21-2021 chart/epic Stress test:none Cardiac Cath : 06-21-2013 epic under encounters Activity level: does own housework and yard work, can climb flight of stairs without problems Sleep Study/ CPAP :none Fasting Blood Sugar :106-138      / Checks Blood Sugar -daily in am:   Hemaglobin a1 c 02-18-2021 epic (6.2)

## 2021-03-27 NOTE — Anesthesia Preprocedure Evaluation (Addendum)
Anesthesia Evaluation  Patient identified by MRN, date of birth, ID band Patient awake    Reviewed: Allergy & Precautions, NPO status , Patient's Chart, lab work & pertinent test results, reviewed documented beta blocker date and time   History of Anesthesia Complications (+) history of anesthetic complications (confusion after back surgery July 2022- pt does not remember)  Airway Mallampati: III  TM Distance: >3 FB Neck ROM: Full    Dental  (+) Teeth Intact, Dental Advisory Given, Missing,    Pulmonary former smoker,  Quit smoking 2012, 40 pack year history  No inhalers   Pulmonary exam normal breath sounds clear to auscultation       Cardiovascular hypertension, Pt. on home beta blockers and Pt. on medications + CAD, + Past MI (2015) and + Cardiac Stents  Normal cardiovascular exam Rhythm:Regular Rate:Normal  Echo 03/21/21: 1. Left ventricular ejection fraction, by estimation, is 50 to 55%. The  left ventricle has low normal function. The left ventricle demonstrates  regional wall motion abnormalities (see scoring diagram/findings for  description). Due to poor apical window,  walls were only able to be assessed in short axis. Indeterminate  diastolic function.  2. Right ventricular systolic function is normal. The right ventricular  size is normal. Tricuspid regurgitation signal is inadequate for assessing  PA pressure.  3. Left atrial size was mildly dilated.  4. The mitral valve is degenerative. Trivial mitral valve regurgitation.  No evidence of mitral stenosis.  5. The aortic valve is abnormal. Unable to determine aortic valve  morphology due to image quality. There is moderate calcification of the  aortic valve. Aortic valve regurgitation is not visualized. Mild to  moderate aortic valve sclerosis/calcification  is present, without any evidence of aortic stenosis. Aortic valve mean  gradient measures 8.3 mmHg.   6. The inferior vena cava is normal in size with greater than 50%  respiratory variability, suggesting right atrial pressure of 3 mmHg.    Neuro/Psych negative neurological ROS  negative psych ROS   GI/Hepatic negative GI ROS, Neg liver ROS,   Endo/Other  diabetes, Well Controlled, Type 2, Insulin Dependent, Oral Hypoglycemic Agentsa1c 6.2 FS 157 this AM  Renal/GU negative Renal ROS Bladder dysfunction (urinary retention after back surgery July 2022- foley in place)      Musculoskeletal  (+) Arthritis , Osteoarthritis,    Abdominal   Peds  Hematology negative hematology ROS (+)   Anesthesia Other Findings   Reproductive/Obstetrics negative OB ROS                          Anesthesia Physical Anesthesia Plan  ASA: 3  Anesthesia Plan: General   Post-op Pain Management:    Induction: Intravenous  PONV Risk Score and Plan: 3 and Ondansetron, Dexamethasone and Treatment may vary due to age or medical condition  Airway Management Planned: LMA  Additional Equipment: None  Intra-op Plan:   Post-operative Plan: Extubation in OR  Informed Consent: I have reviewed the patients History and Physical, chart, labs and discussed the procedure including the risks, benefits and alternatives for the proposed anesthesia with the patient or authorized representative who has indicated his/her understanding and acceptance.     Dental advisory given  Plan Discussed with: CRNA  Anesthesia Plan Comments:        Anesthesia Quick Evaluation

## 2021-03-28 ENCOUNTER — Ambulatory Visit (HOSPITAL_BASED_OUTPATIENT_CLINIC_OR_DEPARTMENT_OTHER)
Admission: RE | Admit: 2021-03-28 | Discharge: 2021-03-29 | Disposition: A | Discharge status: Home / Self Care | Payer: Medicare Other | Source: Ambulatory Visit | Attending: Urology | Admitting: Urology

## 2021-03-28 ENCOUNTER — Ambulatory Visit (HOSPITAL_BASED_OUTPATIENT_CLINIC_OR_DEPARTMENT_OTHER): Payer: Medicare Other | Admitting: Anesthesiology

## 2021-03-28 ENCOUNTER — Other Ambulatory Visit: Payer: Self-pay

## 2021-03-28 ENCOUNTER — Encounter (HOSPITAL_BASED_OUTPATIENT_CLINIC_OR_DEPARTMENT_OTHER)
Admission: RE | Disposition: A | Discharge status: Home / Self Care | Payer: Self-pay | Source: Ambulatory Visit | Attending: Urology

## 2021-03-28 ENCOUNTER — Encounter (HOSPITAL_BASED_OUTPATIENT_CLINIC_OR_DEPARTMENT_OTHER): Payer: Self-pay | Admitting: Urology

## 2021-03-28 DIAGNOSIS — N138 Other obstructive and reflux uropathy: Secondary | ICD-10-CM

## 2021-03-28 DIAGNOSIS — E785 Hyperlipidemia, unspecified: Secondary | ICD-10-CM | POA: Insufficient documentation

## 2021-03-28 DIAGNOSIS — N312 Flaccid neuropathic bladder, not elsewhere classified: Secondary | ICD-10-CM | POA: Diagnosis not present

## 2021-03-28 DIAGNOSIS — N400 Enlarged prostate without lower urinary tract symptoms: Secondary | ICD-10-CM | POA: Diagnosis present

## 2021-03-28 DIAGNOSIS — R338 Other retention of urine: Secondary | ICD-10-CM | POA: Diagnosis not present

## 2021-03-28 DIAGNOSIS — N401 Enlarged prostate with lower urinary tract symptoms: Secondary | ICD-10-CM | POA: Diagnosis not present

## 2021-03-28 DIAGNOSIS — E119 Type 2 diabetes mellitus without complications: Secondary | ICD-10-CM | POA: Diagnosis not present

## 2021-03-28 DIAGNOSIS — I252 Old myocardial infarction: Secondary | ICD-10-CM | POA: Diagnosis not present

## 2021-03-28 DIAGNOSIS — I110 Hypertensive heart disease with heart failure: Secondary | ICD-10-CM | POA: Diagnosis not present

## 2021-03-28 DIAGNOSIS — Z955 Presence of coronary angioplasty implant and graft: Secondary | ICD-10-CM | POA: Insufficient documentation

## 2021-03-28 DIAGNOSIS — M19041 Primary osteoarthritis, right hand: Secondary | ICD-10-CM | POA: Diagnosis not present

## 2021-03-28 DIAGNOSIS — I5022 Chronic systolic (congestive) heart failure: Secondary | ICD-10-CM | POA: Diagnosis not present

## 2021-03-28 DIAGNOSIS — Z87891 Personal history of nicotine dependence: Secondary | ICD-10-CM | POA: Diagnosis not present

## 2021-03-28 DIAGNOSIS — M160 Bilateral primary osteoarthritis of hip: Secondary | ICD-10-CM | POA: Insufficient documentation

## 2021-03-28 DIAGNOSIS — I2510 Atherosclerotic heart disease of native coronary artery without angina pectoris: Secondary | ICD-10-CM | POA: Diagnosis not present

## 2021-03-28 DIAGNOSIS — M19042 Primary osteoarthritis, left hand: Secondary | ICD-10-CM | POA: Insufficient documentation

## 2021-03-28 DIAGNOSIS — N318 Other neuromuscular dysfunction of bladder: Secondary | ICD-10-CM | POA: Diagnosis not present

## 2021-03-28 HISTORY — DX: Retention of urine, unspecified: R33.9

## 2021-03-28 HISTORY — PX: TRANSURETHRAL RESECTION OF PROSTATE: SHX73

## 2021-03-28 HISTORY — DX: Personal history of urinary calculi: Z87.442

## 2021-03-28 HISTORY — DX: Presence of other specified devices: Z97.8

## 2021-03-28 LAB — POCT I-STAT, CHEM 8
BUN: 15 mg/dL (ref 8–23)
Calcium, Ion: 1.24 mmol/L (ref 1.15–1.40)
Chloride: 102 mmol/L (ref 98–111)
Creatinine, Ser: 0.8 mg/dL (ref 0.61–1.24)
Glucose, Bld: 157 mg/dL — ABNORMAL HIGH (ref 70–99)
HCT: 41 % (ref 39.0–52.0)
Hemoglobin: 13.9 g/dL (ref 13.0–17.0)
Potassium: 4.4 mmol/L (ref 3.5–5.1)
Sodium: 142 mmol/L (ref 135–145)
TCO2: 27 mmol/L (ref 22–32)

## 2021-03-28 LAB — BASIC METABOLIC PANEL
Anion gap: 8 (ref 5–15)
BUN: 17 mg/dL (ref 8–23)
CO2: 25 mmol/L (ref 22–32)
Calcium: 8.9 mg/dL (ref 8.9–10.3)
Chloride: 104 mmol/L (ref 98–111)
Creatinine, Ser: 0.79 mg/dL (ref 0.61–1.24)
GFR, Estimated: >60 mL/min (ref >60)
Glucose, Bld: 134 mg/dL — ABNORMAL HIGH (ref 70–99)
Potassium: 4.7 mmol/L (ref 3.5–5.1)
Sodium: 137 mmol/L (ref 135–145)

## 2021-03-28 LAB — GLUCOSE, CAPILLARY
Glucose-Capillary: 128 mg/dL — ABNORMAL HIGH (ref 70–99)
Glucose-Capillary: 447 mg/dL — ABNORMAL HIGH (ref 70–99)
Glucose-Capillary: 324 mg/dL — ABNORMAL HIGH (ref 70–99)
Glucose-Capillary: 315 mg/dL — ABNORMAL HIGH (ref 70–99)

## 2021-03-28 LAB — HEMOGLOBIN AND HEMATOCRIT, BLOOD
HCT: 37.6 % — ABNORMAL LOW (ref 39.0–52.0)
Hemoglobin: 12.5 g/dL — ABNORMAL LOW (ref 13.0–17.0)

## 2021-03-28 SURGERY — TURP (TRANSURETHRAL RESECTION OF PROSTATE)
Anesthesia: General | Site: Prostate

## 2021-03-28 MED ORDER — HYDROCODONE-ACETAMINOPHEN 7.5-325 MG PO TABS: 1.0000 | ORAL_TABLET | Freq: Once | ORAL | Status: DC | PRN

## 2021-03-28 MED ORDER — PHENYLEPHRINE 40 MCG/ML (10ML) SYRINGE FOR IV PUSH (FOR BLOOD PRESSURE SUPPORT)
PREFILLED_SYRINGE | INTRAVENOUS | Status: DC | PRN
  Administered 2021-03-28 (×2): 80 ug via INTRAVENOUS
  Administered 2021-03-28: 120 ug via INTRAVENOUS

## 2021-03-28 MED ORDER — OXYCODONE-ACETAMINOPHEN 5-325 MG PO TABS: 1.0000 | ORAL_TABLET | ORAL | 0 refills | Status: DC | PRN

## 2021-03-28 MED ORDER — BELLADONNA ALKALOIDS-OPIUM 16.2-60 MG RE SUPP: 1.0000 | Freq: Four times a day (QID) | RECTAL | Status: DC | PRN

## 2021-03-28 MED ORDER — MORPHINE SULFATE (PF) 4 MG/ML IV SOLN: 2.0000 mg | INTRAVENOUS | Status: DC | PRN

## 2021-03-28 MED ORDER — ONDANSETRON HCL 4 MG/2ML IJ SOLN: 4.0000 mg | Freq: Once | INTRAMUSCULAR | Status: DC | PRN

## 2021-03-28 MED ORDER — LIDOCAINE 2% (20 MG/ML) 5 ML SYRINGE
INTRAMUSCULAR | Status: DC | PRN
Start: 2021-03-28 — End: 2021-03-28
  Administered 2021-03-28: 60 mg via INTRAVENOUS

## 2021-03-28 MED ORDER — INSULIN ASPART 100 UNIT/ML IJ SOLN
INTRAMUSCULAR | Status: AC
  Filled 2021-03-28: qty 1

## 2021-03-28 MED ORDER — DOCUSATE SODIUM 100 MG PO CAPS: 100.0000 mg | ORAL_CAPSULE | Freq: Every day | ORAL | 0 refills | Status: DC | PRN

## 2021-03-28 MED ORDER — ONDANSETRON HCL 4 MG/2ML IJ SOLN: 4.0000 mg | INTRAMUSCULAR | Status: DC | PRN

## 2021-03-28 MED ORDER — OXYCODONE-ACETAMINOPHEN 5-325 MG PO TABS
1.0000 | ORAL_TABLET | ORAL | Status: DC | PRN
  Administered 2021-03-29: 2 via ORAL

## 2021-03-28 MED ORDER — ACETAMINOPHEN 325 MG PO TABS
ORAL_TABLET | ORAL | Status: AC
  Filled 2021-03-28: qty 2

## 2021-03-28 MED ORDER — SODIUM CHLORIDE 0.9 % IR SOLN: 3000.0000 mL | Status: DC

## 2021-03-28 MED ORDER — SODIUM CHLORIDE 0.9 % IR SOLN
Status: DC | PRN
  Administered 2021-03-28 (×4): 6000 mL via INTRAVESICAL

## 2021-03-28 MED ORDER — ACETAMINOPHEN 325 MG PO TABS
650.0000 mg | ORAL_TABLET | ORAL | Status: DC | PRN
  Administered 2021-03-28: 650 mg via ORAL

## 2021-03-28 MED ORDER — DEXAMETHASONE SODIUM PHOSPHATE 10 MG/ML IJ SOLN
INTRAMUSCULAR | Status: AC
  Filled 2021-03-28: qty 1

## 2021-03-28 MED ORDER — FENTANYL CITRATE (PF) 100 MCG/2ML IJ SOLN: 25.0000 ug | INTRAMUSCULAR | Status: DC | PRN

## 2021-03-28 MED ORDER — FENTANYL CITRATE (PF) 100 MCG/2ML IJ SOLN
INTRAMUSCULAR | Status: AC
  Filled 2021-03-28: qty 2

## 2021-03-28 MED ORDER — TAMSULOSIN HCL 0.4 MG PO CAPS
ORAL_CAPSULE | ORAL | Status: AC
  Filled 2021-03-28: qty 1

## 2021-03-28 MED ORDER — PROPOFOL 10 MG/ML IV BOLUS
INTRAVENOUS | Status: DC | PRN
  Administered 2021-03-28: 150 mg via INTRAVENOUS
  Administered 2021-03-28: 50 mg via INTRAVENOUS

## 2021-03-28 MED ORDER — PROPOFOL 10 MG/ML IV BOLUS
INTRAVENOUS | Status: AC
  Filled 2021-03-28: qty 20

## 2021-03-28 MED ORDER — LISINOPRIL 20 MG PO TABS
20.0000 mg | ORAL_TABLET | Freq: Every day | ORAL | Status: DC
  Administered 2021-03-28: 20 mg via ORAL
  Filled 2021-03-28: qty 1

## 2021-03-28 MED ORDER — GLIMEPIRIDE 4 MG PO TABS: 4.0000 mg | ORAL_TABLET | Freq: Two times a day (BID) | ORAL | Status: DC

## 2021-03-28 MED ORDER — OXYBUTYNIN CHLORIDE ER 10 MG PO TB24
ORAL_TABLET | ORAL | Status: AC
  Filled 2021-03-28: qty 1

## 2021-03-28 MED ORDER — POLYETHYLENE GLYCOL 3350 17 G PO PACK: 17.0000 g | PACK | Freq: Every day | ORAL | Status: DC | PRN

## 2021-03-28 MED ORDER — ZOLPIDEM TARTRATE 5 MG PO TABS
ORAL_TABLET | ORAL | Status: AC
  Filled 2021-03-28: qty 1

## 2021-03-28 MED ORDER — SODIUM CHLORIDE 0.9% FLUSH: 3.0000 mL | Freq: Two times a day (BID) | INTRAVENOUS | Status: DC

## 2021-03-28 MED ORDER — OXYBUTYNIN CHLORIDE ER 10 MG PO TB24
10.0000 mg | ORAL_TABLET | Freq: Every day | ORAL | Status: DC
  Administered 2021-03-28: 10 mg via ORAL

## 2021-03-28 MED ORDER — CEFAZOLIN SODIUM-DEXTROSE 2-4 GM/100ML-% IV SOLN
INTRAVENOUS | Status: AC
  Filled 2021-03-28: qty 100

## 2021-03-28 MED ORDER — ONDANSETRON HCL 4 MG/2ML IJ SOLN
INTRAMUSCULAR | Status: AC
  Filled 2021-03-28: qty 2

## 2021-03-28 MED ORDER — ONDANSETRON HCL 4 MG/2ML IJ SOLN
INTRAMUSCULAR | Status: DC | PRN
  Administered 2021-03-28: 4 mg via INTRAVENOUS

## 2021-03-28 MED ORDER — CEFAZOLIN SODIUM-DEXTROSE 2-4 GM/100ML-% IV SOLN
2.0000 g | Freq: Once | INTRAVENOUS | Status: AC
  Administered 2021-03-28: 2 g via INTRAVENOUS

## 2021-03-28 MED ORDER — KCL IN DEXTROSE-NACL 20-5-0.45 MEQ/L-%-% IV SOLN
INTRAVENOUS | Status: DC
  Administered 2021-03-28: 100 mL/h via INTRAVENOUS
  Filled 2021-03-28 (×2): qty 1000

## 2021-03-28 MED ORDER — LIDOCAINE 2% (20 MG/ML) 5 ML SYRINGE
INTRAMUSCULAR | Status: AC
  Filled 2021-03-28: qty 5

## 2021-03-28 MED ORDER — INSULIN ASPART 100 UNIT/ML IJ SOLN
4.0000 [IU] | Freq: Once | INTRAMUSCULAR | Status: AC
  Administered 2021-03-28: 4 [IU] via SUBCUTANEOUS

## 2021-03-28 MED ORDER — METOPROLOL TARTRATE 25 MG PO TABS
ORAL_TABLET | ORAL | Status: AC
  Filled 2021-03-28: qty 1

## 2021-03-28 MED ORDER — PHENYLEPHRINE HCL (PRESSORS) 10 MG/ML IV SOLN: INTRAVENOUS | Status: DC | PRN

## 2021-03-28 MED ORDER — GLIMEPIRIDE 4 MG PO TABS
4.0000 mg | ORAL_TABLET | Freq: Two times a day (BID) | ORAL | Status: DC
  Administered 2021-03-28: 4 mg via ORAL
  Filled 2021-03-28: qty 1

## 2021-03-28 MED ORDER — ONDANSETRON HCL 4 MG PO TABS: 4.0000 mg | ORAL_TABLET | Freq: Every day | ORAL | 0 refills | Status: DC | PRN

## 2021-03-28 MED ORDER — LACTATED RINGERS IV SOLN: INTRAVENOUS | Status: DC

## 2021-03-28 MED ORDER — SODIUM CHLORIDE 0.9% FLUSH: 3.0000 mL | INTRAVENOUS | Status: DC | PRN

## 2021-03-28 MED ORDER — AMLODIPINE BESYLATE 5 MG PO TABS
5.0000 mg | ORAL_TABLET | Freq: Every day | ORAL | Status: DC
  Administered 2021-03-28: 5 mg via ORAL
  Filled 2021-03-28: qty 1

## 2021-03-28 MED ORDER — INSULIN ASPART 100 UNIT/ML IJ SOLN
0.0000 [IU] | Freq: Three times a day (TID) | INTRAMUSCULAR | Status: DC
  Administered 2021-03-28: 15 [IU] via SUBCUTANEOUS
  Administered 2021-03-29: 3 [IU] via SUBCUTANEOUS

## 2021-03-28 MED ORDER — DEXAMETHASONE SODIUM PHOSPHATE 10 MG/ML IJ SOLN
INTRAMUSCULAR | Status: DC | PRN
Start: 2021-03-28 — End: 2021-03-28
  Administered 2021-03-28: 5 mg via INTRAVENOUS

## 2021-03-28 MED ORDER — METOPROLOL TARTRATE 25 MG PO TABS
25.0000 mg | ORAL_TABLET | Freq: Two times a day (BID) | ORAL | Status: DC
  Administered 2021-03-28: 25 mg via ORAL

## 2021-03-28 MED ORDER — ROSUVASTATIN CALCIUM 20 MG PO TABS
20.0000 mg | ORAL_TABLET | Freq: Every day | ORAL | Status: DC
  Administered 2021-03-28: 20 mg via ORAL
  Filled 2021-03-28: qty 1

## 2021-03-28 MED ORDER — OMEGA-3-ACID ETHYL ESTERS 1 G PO CAPS
1.0000 g | ORAL_CAPSULE | Freq: Two times a day (BID) | ORAL | Status: DC
  Administered 2021-03-28: 1 g via ORAL
  Filled 2021-03-28: qty 1

## 2021-03-28 MED ORDER — INSULIN DETEMIR 100 UNIT/ML ~~LOC~~ SOLN
55.0000 [IU] | Freq: Every day | SUBCUTANEOUS | Status: DC
  Filled 2021-03-28: qty 0.55

## 2021-03-28 MED ORDER — DIPHENHYDRAMINE HCL 50 MG/ML IJ SOLN: 12.5000 mg | Freq: Four times a day (QID) | INTRAMUSCULAR | Status: DC | PRN

## 2021-03-28 MED ORDER — SODIUM CHLORIDE 0.9 % IV SOLN: 250.0000 mL | INTRAVENOUS | Status: DC | PRN

## 2021-03-28 MED ORDER — BACITRACIN-NEOMYCIN-POLYMYXIN 400-5-5000 EX OINT: 1.0000 "application " | TOPICAL_OINTMENT | Freq: Three times a day (TID) | CUTANEOUS | Status: DC | PRN

## 2021-03-28 MED ORDER — TAMSULOSIN HCL 0.4 MG PO CAPS
0.4000 mg | ORAL_CAPSULE | Freq: Every day | ORAL | Status: DC
  Administered 2021-03-28: 0.4 mg via ORAL

## 2021-03-28 MED ORDER — DIPHENHYDRAMINE HCL 12.5 MG/5ML PO ELIX: 12.5000 mg | ORAL_SOLUTION | Freq: Four times a day (QID) | ORAL | Status: DC | PRN

## 2021-03-28 MED ORDER — FENTANYL CITRATE (PF) 100 MCG/2ML IJ SOLN
INTRAMUSCULAR | Status: DC | PRN
  Administered 2021-03-28 (×2): 25 ug via INTRAVENOUS
  Administered 2021-03-28: 50 ug via INTRAVENOUS

## 2021-03-28 MED ORDER — ZOLPIDEM TARTRATE 5 MG PO TABS
5.0000 mg | ORAL_TABLET | Freq: Every evening | ORAL | Status: DC | PRN
  Administered 2021-03-28: 5 mg via ORAL

## 2021-03-28 SURGICAL SUPPLY — 26 items
BAG DRAIN URO-CYSTO SKYTR STRL (DRAIN) ×2 IMPLANT
BAG DRN RND TRDRP ANRFLXCHMBR (UROLOGICAL SUPPLIES) ×1
BAG DRN UROCATH (DRAIN) ×1
BAG URINE DRAIN 2000ML AR STRL (UROLOGICAL SUPPLIES) ×2 IMPLANT
BAG URINE LEG 500ML (DRAIN) IMPLANT
CATH FOLEY 2WAY SLVR 30CC 20FR (CATHETERS) IMPLANT
CATH FOLEY 3WAY 30CC 22FR (CATHETERS) ×2 IMPLANT
CATH HEMA 3WAY 30CC 22FR COUDE (CATHETERS) ×2 IMPLANT
CLOTH BEACON ORANGE TIMEOUT ST (SAFETY) ×2 IMPLANT
ELECT BIVAP BIPO 22/24 DONUT (ELECTROSURGICAL)
ELECT REM PT RETURN 9FT ADLT (ELECTROSURGICAL)
ELECTRD BIVAP BIPO 22/24 DONUT (ELECTROSURGICAL) IMPLANT
ELECTRODE REM PT RTRN 9FT ADLT (ELECTROSURGICAL) IMPLANT
GLOVE SURG ENC MOIS LTX SZ7 (GLOVE) ×2 IMPLANT
GOWN STRL REUS W/TWL LRG LVL3 (GOWN DISPOSABLE) ×2 IMPLANT
HOLDER FOLEY CATH W/STRAP (MISCELLANEOUS) ×2 IMPLANT
IV NS IRRIG 3000ML ARTHROMATIC (IV SOLUTION) ×20 IMPLANT
KIT TURNOVER CYSTO (KITS) ×2 IMPLANT
LOOP CUT BIPOLAR 24F LRG (ELECTROSURGICAL) ×2 IMPLANT
MANIFOLD NEPTUNE II (INSTRUMENTS) ×2 IMPLANT
PACK CYSTO (CUSTOM PROCEDURE TRAY) ×2 IMPLANT
SYR 30ML LL (SYRINGE) ×2 IMPLANT
SYR TOOMEY IRRIG 70ML (MISCELLANEOUS) ×2
SYRINGE TOOMEY IRRIG 70ML (MISCELLANEOUS) ×1 IMPLANT
TUBE CONNECTING 12X1/4 (SUCTIONS) ×2 IMPLANT
TUBING UROLOGY SET (TUBING) ×2 IMPLANT

## 2021-03-28 NOTE — Transfer of Care (Signed)
Immediate Anesthesia Transfer of Care Note  Patient: SAIF PETER  Procedure(s) Performed: TRANSURETHRAL RESECTION OF THE PROSTATE (TURP)/ BIPOLAR (Prostate)  Patient Location: PACU  Anesthesia Type:General  Level of Consciousness: awake, drowsy and responds to stimulation  Airway & Oxygen Therapy: Patient Spontanous Breathing and Patient connected to nasal cannula oxygen  Post-op Assessment: Report given to RN and Post -op Vital signs reviewed and stable  Post vital signs: Reviewed and stable  Last Vitals:  Vitals Value Taken Time  BP 139/72 03/28/21 1017  Temp    Pulse 69 03/28/21 1018  Resp 13 03/28/21 1018  SpO2 100 % 03/28/21 1018  Vitals shown include unvalidated device data.  Last Pain:  Vitals:   03/28/21 0724  TempSrc: Oral  PainSc: 0-No pain      Patients Stated Pain Goal: 5 (03/28/21 0724)  Complications: No notable events documented.

## 2021-03-28 NOTE — Anesthesia Postprocedure Evaluation (Signed)
Anesthesia Post Note  Patient: Tyler Garner  Procedure(s) Performed: TRANSURETHRAL RESECTION OF THE PROSTATE (TURP)/ BIPOLAR (Prostate)     Patient location during evaluation: PACU Anesthesia Type: General Level of consciousness: awake and alert, oriented and patient cooperative Pain management: pain level controlled Vital Signs Assessment: post-procedure vital signs reviewed and stable Respiratory status: spontaneous breathing, nonlabored ventilation and respiratory function stable Cardiovascular status: blood pressure returned to baseline and stable Postop Assessment: no apparent nausea or vomiting Anesthetic complications: no   No notable events documented.  Last Vitals:  Vitals:   03/28/21 1030 03/28/21 1045  BP: (!) 113/57 115/63  Pulse: 74 76  Resp: 15 14  Temp:  36.7 C  SpO2: 99% 98%    Last Pain:  Vitals:   03/28/21 1030  TempSrc:   PainSc: 0-No pain                 Lannie Fields

## 2021-03-28 NOTE — H&P (Signed)
CC/HPI: Tyler Garner is a 65 year old male seen in follow-up with urinary retention.   1. Urinary retention:  He is seen as a hospital follow-up with urinary retention. He underwent lumbar decompression on 11/28/2020 and postoperatively developed urinary retention along with postoperative ileus. He failed a void trial on 12/02/2020. Prior to this surgery, he reported that he had a strong flow of stream and denies sensation incomplete bladder emptying. He did have 2 time nocturia. He denies taking alpha blockers prior to this episode. He does have a history of lower back pain radiating down his right leg and had some difficulty ambulating over the past several years. He has been on tamsulosin 0.4 mg over the past 6 weeks.  Urodynamics 02/05/2021: Maximum capacity 360 mL, delayed for sensation at 269 mL, unable to generate a voluntary contraction. His average pressure was 14.4 cm water.  BPH evaluation 02/17/2021: TRUS volume 59cm^3. Cystoscopy with bilobar obstructing prostate.   #2. Prostate cancer screening: DRE today at least 70 g, no nodules. PSA 12/27/2020 1.0. He denies a fear of prostate cancer.   He has a past medical history of CHF, DM2, HTN, HLD, history of inferior STEMI s/p DES in 2015, lumbar DDD s/p lumbar decompression. He would need medical clearance prior to procedure.   Of note, he has a grandfather of Brittany's child.   Patient currently denies fever, chills, sweats, nausea, vomiting, abdominal or flank pain, gross hematuria or dysuria.     ALLERGIES: Percocet    MEDICATIONS: Lisinopril 20 mg tablet  Metoprolol Tartrate 25 mg tablet  Tamsulosin Hcl 0.4 mg capsule 1 capsule PO Q HS  Tamsulosin Hcl 0.4 mg capsule  Amlodipine Besylate 5 mg tablet  Ferrous Sulfate 325 mg (65 mg iron) tablet  Glimepiride 4 mg tablet  Insulin Glargine 100 unit/ml vial  Janumet 50 mg-1,000 mg tablet  Levofloxacin 750 mg tablet  Oxycodone Hcl 5 mg capsule  Polyethylene Glycol  Rosuvastatin  Calcium 20 mg tablet  Senna 8.6 mg tablet     GU PSH: Complex cystometrogram, w/ void pressure and urethral pressure profile studies, any technique - 02/05/2021 Complex Uroflow - 02/05/2021 Emg surf Electrd - 02/05/2021 Inject For cystogram - 02/05/2021 Intrabd voidng Press - 02/05/2021       PSH Notes: Back Surgery - 11/23/20   NON-GU PSH: Cardiac Stent Placement     GU PMH: Urinary Retention - 02/05/2021, - 12/26/2020 BPH w/LUTS - 12/26/2020 Encounter for Prostate Cancer screening - 12/26/2020    NON-GU PMH: Arthritis Diabetes Type 2 Hypercholesterolemia Hypertension Myocardial Infarction Sleep Apnea    FAMILY HISTORY: 3 Son's - Runs in Family Diabetes - Runs in Family High Blood Pressure - Runs in Family   SOCIAL HISTORY: Marital Status: Widowed Ethnicity: Not Hispanic Or Latino; Race: White Current Smoking Status: Patient does not smoke anymore. Has not smoked since 12/23/2009. Smoked for 45 years.   Tobacco Use Assessment Completed: Used Tobacco in last 30 days? Does not drink anymore.  Does not drink caffeine.    REVIEW OF SYSTEMS:    GU Review Male:   Patient denies frequent urination, hard to postpone urination, burning/ pain with urination, get up at night to urinate, leakage of urine, stream starts and stops, trouble starting your stream, have to strain to urinate , erection problems, and penile pain.  Gastrointestinal (Upper):   Patient denies nausea, vomiting, and indigestion/ heartburn.  Gastrointestinal (Lower):   Patient denies diarrhea and constipation.  Constitutional:   Patient denies fever, night sweats, weight loss,  and fatigue.  Skin:   Patient denies skin rash/ lesion and itching.  Eyes:   Patient denies blurred vision and double vision.  Ears/ Nose/ Throat:   Patient denies sinus problems and sore throat.  Hematologic/Lymphatic:   Patient denies swollen glands and easy bruising.  Cardiovascular:   Patient denies leg swelling and chest pains.   Respiratory:   Patient denies cough and shortness of breath.  Endocrine:   Patient denies excessive thirst.  Musculoskeletal:   Patient denies back pain and joint pain.  Neurological:   Patient denies headaches and dizziness.  Psychologic:   Patient denies depression and anxiety.   VITAL SIGNS: None   MULTI-SYSTEM PHYSICAL EXAMINATION:    Constitutional: Well-nourished. No physical deformities. Normally developed. Good grooming.  Respiratory: No labored breathing, no use of accessory muscles.   Cardiovascular: Normal temperature, normal extremity pulses, no swelling, no varicosities.  Gastrointestinal: No mass, no tenderness, no rigidity, non obese abdomen.     Complexity of Data:  Source Of History:  Patient, Medical Record Summary  Lab Test Review:   PSA  Records Review:   AUA Symptom Score, Previous Doctor Records, Previous Hospital Records  Urine Test Review:   Urinalysis  Urodynamics Review:   Review Bladder Scan, Review Urodynamics Tests  X-Ray Review: Prostate Ultrasound: Reviewed Films. Reviewed Report. Discussed With Patient.     12/27/20  PSA  Total PSA 1.01 ng/mL    PROCEDURES:         Flexible Cystoscopy - 52000  Risks, benefits, and some of the potential complications of the procedure were discussed at length with the patient including infection, bleeding, voiding discomfort, urinary retention, fever, chills, sepsis, and others. All questions were answered. Informed consent was obtained. Antibiotic prophylaxis was given. Sterile technique and intraurethral analgesia were used.  Meatus:  Normal size. Normal location. Normal condition.  Urethra:  No strictures.  External Sphincter:  Normal.  Verumontanum:  Normal.  Prostate:  Obstructing. Severe hyperplasia.  Bladder Neck:  Non-obstructing.  Ureteral Orifices:  Normal location. Normal size. Normal shape. Effluxed clear urine.  Bladder:  Moderate trabeculation. No tumors. Inflammation on posterior bladder wall from  indwelling foley.      The lower urinary tract was carefully examined. The procedure was well-tolerated and without complications. Antibiotic instructions were given. Instructions were given to call the office immediately for bloody urine, difficulty urinating, urinary retention, painful or frequent urination, fever, chills, nausea, vomiting or other illness. The patient stated that he understood these instructions and would comply with them.          Prostate Ultrasound - 26948  Length:5.29cm Height:4.16cm Width:5.18cm Volume:59.49ml  Prostate:  heterogeneous apperance urinary catheter in place   Bladder:  urinary catheter in place  Prostate:  heterogeneous apperance urinary catheter in place       The transrectal ultrasound probe is introduced into the rectum, and the prostate is visualized. Ultrasonography is utilized throughout the procedure. At the conclusion of the procedure, the ultrasound probe is removed. The patient tolerates the procedure without complication. . Patient confirmed No Neulasta OnPro Device.          PVR Ultrasound - 54627  Scanned Volume: 223 cc        Simple Foley Catheterization - 51702  Pt was prepped and cleaned. A 16 French Foley catheter was inserted into the bladder using sterile technique. Hand irrigation of the bladder with sterile water was performed. A leg bag was connected. Pt tolerated procedure well.    ASSESSMENT:  ICD-10 Details  1 GU:   BPH w/LUTS - N40.1   2   Urinary Retention - R33.8   3   Encounter for Prostate Cancer screening - Z12.5    PLAN:           Document Letter(s):  Created for Patient: Clinical Summary         Notes:   #1. Urinary retention/BPH with LUTS:  -He failed a void trial on 12/02/2020, 12/26/2020, 02/05/2021 and 02/17/2021  -I reviewed BPH evaluation today with evidence of 60 g prostate, bilobar obstructing prostate.  -I reviewed urodynamics that demonstrates that his mean pressure is only 14.4 cm of water. I  do think that his greatest chance of being catheter free would be undergoing a TURP. I did however discussed with him the risk of still requiring a Foley catheter given his relative hypotonic bladder.  -We did discuss surgical options for intervention including transurethral section of the prostate. We did discuss that if he does not have an adequate bladder function, we may still attempt a resection knowing that he may not be catheter free. We also discussed the risk of uncovering prostate cancer with a TURP.  -We discussed the risks of the procedure. He elects to proceed with TURP. Surgery letter sent. He will need medical clearance prior.   #2. Prostate cancer screening: DRE 60 g, no nodules. Most recent PSA was 1.0 on 12/27/2020.   CC: Tyler Pretty, MD   Urology Preoperative H&P   Chief Complaint: BPH  History of Present Illness: Tyler Garner is a 64 y.o. male with BPH with urinary retention here for bipolar TURP. Denies fevers, chills. Tolerating catheter.    Past Medical History:  Diagnosis Date   Arthritis    hands and hips oa   CAD (coronary artery disease)    a. inferior STEMI (05/2013) with RV involvement- LHC:  dist CFX occluded (Promus premier (2.5x24 mm) DES); EF 40-45% with inf HK;  b. Echo (report pending) with EF 45-50%, lat WMA, RVE, mod RV hypokinesis    Complication of anesthesia 11/28/2020   confusion x 1 hour after 11-28-2020 back surgery   Foley catheter in place    since 11-28-2020 foley last changed 1 week ago as of 03-25-2021   History of kidney stones    Hyperlipidemia    Hypertension    Ischemic cardiomyopathy    Myocardial infarction (Tyler Garner) 2015   Nicotine addiction    Renal colic    Right shoulder injury 10/11/2009   Type 2 DM    Urinary retention    since july 2022    Past Surgical History:  Procedure Laterality Date   heart stent  06/21/2013   x 1 to distal LCX   LEFT HEART CATHETERIZATION WITH CORONARY ANGIOGRAM N/A 06/21/2013    Procedure: LEFT HEART CATHETERIZATION WITH CORONARY ANGIOGRAM;  Surgeon: Peter M Martinique, MD;  Location: [facility] CATH LAB;  Service: Cardiovascular;  Laterality: N/A;   LUMBAR LAMINECTOMY/DECOMPRESSION MICRODISCECTOMY N/A 11/28/2020   Procedure: LUMBAR DECOMPRESSION  2 LEVELS, L3 and L5;  Surgeon: Melina Schools, MD;  Location: [facility];  Service: Orthopedics;  Laterality: N/A;  3.5 hrs    Allergies:  Allergies  Allergen Reactions   Percocet [Oxycodone-Acetaminophen] Nausea And Vomiting    Family History  Problem Relation Age of Onset   Diabetes Mother    ALS Mother    Alzheimer's disease Father    Healthy Sister    Diabetes Brother     Social History:  reports that he quit smoking about 10 years ago. His smoking use included cigarettes. He has a 40.00 pack-year smoking history. He quit smokeless tobacco use about 12 years ago. He reports that he does not currently use alcohol. He reports that he does not use drugs.  ROS: A complete review of systems was performed.  All systems are negative except for pertinent findings as noted.  Physical Exam:  Vital signs in last 24 hours: Temp:  [98.4 F (36.9 C)] 98.4 F (36.9 C) (11/04 0724) Pulse Rate:  [77] 77 (11/04 0724) Resp:  [16] 16 (11/04 0724) BP: (159)/(84) 159/84 (11/04 0724) SpO2:  [98 %] 98 % (11/04 0724) Weight:  [100.2 kg] 100.2 kg (11/04 0724) Constitutional:  Alert and oriented, No acute distress Cardiovascular: Regular rate and rhythm Respiratory: Normal respiratory effort, Lungs clear bilaterally GI: Abdomen is soft, nontender, nondistended, no abdominal masses GU: No CVA tenderness Lymphatic: No lymphadenopathy Neurologic: Grossly intact, no focal deficits Psychiatric: Normal mood and affect  Laboratory Data:  Recent Labs    03/28/21 0719  HGB 13.9  HCT 41.0    Recent Labs    03/28/21 0719  NA 142  K 4.4  CL 102  GLUCOSE 157*  BUN 15  CREATININE 0.80     Results for orders placed or performed during the  hospital encounter of 03/28/21 (from the past 24 hour(s))  I-STAT, chem 8     Status: Abnormal   Collection Time: 03/28/21  7:19 AM  Result Value Ref Range   Sodium 142 135 - 145 mmol/L   Potassium 4.4 3.5 - 5.1 mmol/L   Chloride 102 98 - 111 mmol/L   BUN 15 8 - 23 mg/dL   Creatinine, Ser 0.80 0.61 - 1.24 mg/dL   Glucose, Bld 157 (H) 70 - 99 mg/dL   Calcium, Ion 1.24 1.15 - 1.40 mmol/L   TCO2 27 22 - 32 mmol/L   Hemoglobin 13.9 13.0 - 17.0 g/dL   HCT 41.0 39.0 - 52.0 %   Recent Results (from the past 240 hour(s))  SARS Coronavirus 2 (TAT 6-24 hrs)     Status: None   Collection Time: 03/21/21 12:00 AM  Result Value Ref Range Status   SARS Coronavirus 2 RESULT: NEGATIVE  Final    Comment: RESULT: NEGATIVESARS-CoV-2 INTERPRETATION:A NEGATIVE  test result means that SARS-CoV-2 RNA was not present in the specimen above the limit of detection of this test. This does not preclude a possible SARS-CoV-2 infection and should not be used as the  sole basis for patient management decisions. Negative results must be combined with clinical observations, patient history, and epidemiological information. Optimum specimen types and timing for peak viral levels during infections caused by SARS-CoV-2  have not been determined. Collection of multiple specimens or types of specimens may be necessary to detect virus. Improper specimen collection and handling, sequence variability under primers/probes, or organism present below the limit of detection may  lead to false negative results. Positive and negative predictive values of testing are highly dependent on prevalence. False negative test results are more likely when prevalence of disease is high.The expected result is NEGATIVE.Fact S heet for  Healthcare Providers: LocalChronicle.no Sheet for Patients: SalonLookup.es Reference Range - Negative     Renal Function: Recent Labs    03/28/21 0719   CREATININE 0.80   Estimated Creatinine Clearance: 107.8 mL/min (by C-G formula based on SCr of 0.8 mg/dL).  Radiologic Imaging: No results found.  I independently reviewed the above imaging studies.  Assessment and Plan TOREN TUCHOLSKI is a 65 y.o. male with BPH with urinary retention here for bipolar TURP.   Risks and benefits of Transurethral Resection of the Prostate were reviewed in detail including infection, bleeding, blood transfusion, injury to bladder/urethra/surrounding structures, erectile dysfunction, urinary incontinence, bladder neck contracture, persistent obstructive and irritative voiding symptoms, and global anesthesia risks including but not limited to CVA, MI, DVT, PE, pneumonia, and death.  He expressed understanding and desire to proceed.    Matt R. Medha Pippen MD 03/28/2021, 8:46 AM  [facility] Pager: [phone]

## 2021-03-28 NOTE — Op Note (Signed)
Operative Note  Preoperative diagnosis:  1.  BPH with bladder outlet obstruction 2. Hypotonic bladder  Postoperative diagnosis: 1.  BPH with bladder outlet obstruction 2. Hypotonic bladder  Procedure(s): 1.  Bipolar transurethral resection of prostate  Surgeon: Jettie Pagan, MD  Assistants:  None  Anesthesia:  General  Complications:  None  EBL:  58ml  Specimens: 1. Prostate chips ID Type Source Tests Collected by Time Destination  1 : prostate chips Tissue PATH GU biopsy SURGICAL PATHOLOGY Tyler Hick, MD 03/28/2021 0160    Drains/Catheters: 1.  22Fr 3 way catheter with 23ml water into balloon  Intraoperative findings:   Bilobar obstructing prostate. Excellent hemostasis.   Indication:  Tyler Garner is a 65 y.o. male with BPH with bladder outlet obstruction presenting for transurethral resection of the prostate. He underwent lumbar decompression on 11/28/2020 and postoperatively developed urinary retention along with postoperative ileus. He failed multiple void trials. Prior to this surgery, he reported that he had a strong flow of stream and without a sensation incomplete bladder emptying. He had a TRUS volume of 59cm^3. Urodynamics demonstrated a weak voluntary contraction of only 14.4cm H20. Preop cystoscopy demonstrated bilobar obstructing prostate. He was counseled that TURP had the greatest chance of leaving him catheter free, however, given his weak bladder contractility, he may ultimately be catheter dependent. After thorough discussion including all relevant risk benefits and alternatives, he presents today for a bipolar TURP.  Description of procedure: The indication, alternatives, benefits and risks were discussed with the patient and informed consent was obtained.  Patient was brought to the operating room table, positioned supine, secured with a safety strap.  Pneumatic compression devices were placed on the lower extremities.  After the administration of  intravenous antibiotics and general anesthesia, the patient was repositioned into the dorsal lithotomy position.  All pressure points were carefully padded.  A rectal examination was performed confirming a smooth symmetric enlarged gland.  The genitalia were prepped and draped in standard sterile manner.  A timeout was completed, verifying the correct patient, surgical procedure and positioning prior to beginning the procedure.  Isotonic sodium chloride was used for irrigation.  A 26 French continuous-flow resectoscope sheath with the visual obturator and a 30 degree lens was advanced under direct vision into the bladder.  The anterior urethra appeared normal in its entirety.The prostatic urethra was elongated with bilobar hyperplasia.  On cystoscopic evaluation, his bladder capacity appeared normal, the bladder wall was noted to expand symmetrically in all dimensions.  There were no tumors, stones or foreign bodies present. The bladder was trabeculated with normal-appearing mucosa.  Both ureteral orifices were in their normal anatomic positions with clear urinary reflux noted bilaterally.  The obturator was removed and replaced by the working element with a resection loop.  The location of the ureteral orifices and the prostatic configuration were again confirmed.  Starting at the bladder neck and proceeding distally to the verumontanum a transurethral section of the prostate was performed using bipolar using energy of 4 and 5 for cutting and coagulation, respectively.  The procedure began at the bladder neck at the 5 o'clock and 7 o'clock positions and carefully carried distally to the verumontanum, resecting the intervening prostatic adenoma.  Next the left lateral lobe was resected to the level of the transverse capsular fibers.  The identical procedure was performed on the right lobe.  Attention was then directed anteriorly and the resection was completed from the 10 o'clock to 2 o'clock positions.  All  bleeding  vessels were fulgurated achieving meticulous hemostasis.  The bladder was irrigated with a Toomey syringe, ensuring removal of all prostate chips which were sent to pathology for evaluation.  Having completed the resection and the chips removed, we again confirmed hemostasis with the loop with coagulating current.  Upon completion of the entire procedure, the bladder and posterior urethra were reexamined, confirming open prostatic urethra and bladder neck without evidence of bleeding or perforation.  Both ureteral orifices and the external sphincter were noted to be intact.  The resectoscope was withdrawn under direct vision and a 22 French three-way Foley catheter with a 30 cc balloon was inserted into the bladder.  The balloon was inflated with 30 cc of sterile water and the catheter was placed on gentle traction.  After multiple manual irrigations ensuring clear return of the irrigant, the procedure was terminated.  The catheter was attached to a drainage bag and continuous bladder irrigation was started with normal saline.  The patient was positioned supine.  At the end of the procedure, all counts were correct.  Patient tolerated the procedure well and was taken to the recovery room satisfactory condition.  Plan: Continuous bladder irrigation overnight with gentle Foley traction.  Plan to discharge home tomorrow with Foley catheter in place and void trial in the office in 3 days.  Tyler R. Shivonne Schwartzman MD [facility]  Pager: [phone]

## 2021-03-28 NOTE — Anesthesia Procedure Notes (Signed)
Procedure Name: LMA Insertion Date/Time: 03/28/2021 8:58 AM Performed by: Elyn Peers, CRNA Pre-anesthesia Checklist: Patient identified, Emergency Drugs available, Suction available, Patient being monitored and Timeout performed Patient Re-evaluated:Patient Re-evaluated prior to induction Oxygen Delivery Method: Circle system utilized Preoxygenation: Pre-oxygenation with 100% oxygen Induction Type: IV induction Ventilation: Mask ventilation without difficulty LMA: LMA inserted LMA Size: 5.0 Number of attempts: 1 Placement Confirmation: positive ETCO2 and breath sounds checked- equal and bilateral Tube secured with: Tape Dental Injury: Teeth and Oropharynx as per pre-operative assessment

## 2021-03-28 NOTE — Discharge Instructions (Addendum)
Activity:  You are encouraged to ambulate frequently (about every hour during waking hours) to help prevent blood clots from forming in your legs or lungs.    Diet: You should advance your diet as instructed by your physician.  It will be normal to have some bloating, nausea, and abdominal discomfort intermittently.  Prescriptions:  You will be provided a prescription for pain medication to take as needed.  If your pain is not severe enough to require the prescription pain medication, you may take extra strength Tylenol instead which will have less side effects.  You should also take a prescribed stool softener to avoid straining with bowel movements as the prescription pain medication may constipate you.  What to call us about: You should call the office [phone]) if you develop fever > 101 or develop persistent vomiting. Activity:  You are encouraged to ambulate frequently (about every hour during waking hours) to help prevent blood clots from forming in your legs or lungs.    You have a foley catheter in place. Return to clinic in 3 days as scheduled for foley removal.

## 2021-03-29 DIAGNOSIS — E119 Type 2 diabetes mellitus without complications: Secondary | ICD-10-CM | POA: Diagnosis not present

## 2021-03-29 DIAGNOSIS — I2510 Atherosclerotic heart disease of native coronary artery without angina pectoris: Secondary | ICD-10-CM | POA: Diagnosis not present

## 2021-03-29 DIAGNOSIS — M160 Bilateral primary osteoarthritis of hip: Secondary | ICD-10-CM | POA: Diagnosis not present

## 2021-03-29 DIAGNOSIS — I110 Hypertensive heart disease with heart failure: Secondary | ICD-10-CM | POA: Diagnosis not present

## 2021-03-29 DIAGNOSIS — M19041 Primary osteoarthritis, right hand: Secondary | ICD-10-CM | POA: Diagnosis not present

## 2021-03-29 DIAGNOSIS — I5022 Chronic systolic (congestive) heart failure: Secondary | ICD-10-CM | POA: Diagnosis not present

## 2021-03-29 DIAGNOSIS — E785 Hyperlipidemia, unspecified: Secondary | ICD-10-CM | POA: Diagnosis not present

## 2021-03-29 DIAGNOSIS — N138 Other obstructive and reflux uropathy: Secondary | ICD-10-CM | POA: Diagnosis not present

## 2021-03-29 DIAGNOSIS — R338 Other retention of urine: Secondary | ICD-10-CM | POA: Diagnosis not present

## 2021-03-29 DIAGNOSIS — Z87891 Personal history of nicotine dependence: Secondary | ICD-10-CM | POA: Diagnosis not present

## 2021-03-29 DIAGNOSIS — N401 Enlarged prostate with lower urinary tract symptoms: Secondary | ICD-10-CM | POA: Diagnosis not present

## 2021-03-29 DIAGNOSIS — Z955 Presence of coronary angioplasty implant and graft: Secondary | ICD-10-CM | POA: Diagnosis not present

## 2021-03-29 DIAGNOSIS — I252 Old myocardial infarction: Secondary | ICD-10-CM | POA: Diagnosis not present

## 2021-03-29 DIAGNOSIS — M19042 Primary osteoarthritis, left hand: Secondary | ICD-10-CM | POA: Diagnosis not present

## 2021-03-29 LAB — CBC
HCT: 36.2 % — ABNORMAL LOW (ref 39.0–52.0)
Hemoglobin: 11.9 g/dL — ABNORMAL LOW (ref 13.0–17.0)
MCH: 26.9 pg (ref 26.0–34.0)
MCHC: 32.9 g/dL (ref 30.0–36.0)
MCV: 81.7 fL (ref 80.0–100.0)
Platelets: 242 10*3/uL (ref 150–400)
RBC: 4.43 MIL/uL (ref 4.22–5.81)
RDW: 14.8 % (ref 11.5–15.5)
WBC: 11.4 10*3/uL — ABNORMAL HIGH (ref 4.0–10.5)
nRBC: 0 % (ref 0.0–0.2)

## 2021-03-29 LAB — BASIC METABOLIC PANEL
Anion gap: 8 (ref 5–15)
BUN: 19 mg/dL (ref 8–23)
CO2: 24 mmol/L (ref 22–32)
Calcium: 8.7 mg/dL — ABNORMAL LOW (ref 8.9–10.3)
Chloride: 102 mmol/L (ref 98–111)
Creatinine, Ser: 0.84 mg/dL (ref 0.61–1.24)
GFR, Estimated: >60 mL/min (ref >60)
Glucose, Bld: 258 mg/dL — ABNORMAL HIGH (ref 70–99)
Potassium: 4.2 mmol/L (ref 3.5–5.1)
Sodium: 134 mmol/L — ABNORMAL LOW (ref 135–145)

## 2021-03-29 LAB — GLUCOSE, CAPILLARY: Glucose-Capillary: 173 mg/dL — ABNORMAL HIGH (ref 70–99)

## 2021-03-29 MED ORDER — OXYCODONE-ACETAMINOPHEN 5-325 MG PO TABS
ORAL_TABLET | ORAL | Status: AC
  Filled 2021-03-29: qty 2

## 2021-03-29 MED ORDER — INSULIN ASPART 100 UNIT/ML IJ SOLN
INTRAMUSCULAR | Status: AC
  Filled 2021-03-29: qty 1

## 2021-03-29 NOTE — Discharge Summary (Signed)
Date of admission: 03/28/2021  Date of discharge: 03/29/2021  Admission diagnosis: Bladder outlet obstruction   Discharge diagnosis: Bladder outlet obstruction   Secondary diagnoses:  Patient Active Problem List   Diagnosis Date Noted   BPH (benign prostatic hyperplasia) 03/28/2021   Ileus, postoperative ([place]) 12/01/2020   Acute urinary retention 12/01/2020   Hypokalemia 12/01/2020   DDD (degenerative disc disease), lumbar 76/80/8811   Chronic systolic CHF (congestive heart failure) ([place]) 12/01/2020   Spinal stenosis 11/28/2020   Morbid obesity ([place]) 05/24/2015   Arthritic-like pain 10/25/2014   Primary hypertension 07/18/2014   Uncontrolled diabetes mellitus 07/18/2014   Coronary atherosclerosis of native coronary artery 06/24/2013   Hx of myocardial infarction 06/21/2013   Hyperlipidemia 08/27/2010    Procedures performed: Procedure(s): TRANSURETHRAL RESECTION OF THE PROSTATE (TURP)/ BIPOLAR  History and Physical: For full details, please see admission history and physical. Briefly, Tyler Garner is a 65 y.o. year old patient with BPH with bladder outlet obstruction presenting for transurethral resection of the prostate. He underwent lumbar decompression on 11/28/2020 and postoperatively developed urinary retention along with postoperative ileus. He failed multiple void trials. Prior to this surgery, he reported that he had a strong flow of stream and without a sensation incomplete bladder emptying. He had a TRUS volume of 59cm^3. Urodynamics demonstrated a weak voluntary contraction of only 14.4cm H20. Preop cystoscopy demonstrated bilobar obstructing prostate. He was counseled that TURP had the greatest chance of leaving him catheter free, however, given his weak bladder contractility, he may ultimately be catheter dependent. After thorough discussion including all relevant risk benefits and alternatives, he presented 11/4 for a bipolar TURP.  Hospital Course: Patient tolerated  the procedure well.  He was then transferred to the floor after an uneventful PACU stay.  He had a 3way foley placed at the end of the procedure and he was on CBI overnight. By the morning of POD1 his urine had cleared. CBI was clamped and his urine remained clear. Of note, patient with history of insulin dependent diabetes. He had high blood sugars (in 300s) overnight and received SSI. He refused his normal home insulin and stated he wanted to take it at home in the morning. He was discharged with his catheter in place. His hospital course was uncomplicated.  On POD#1 he had met discharge criteria: was eating a regular diet, was up and ambulating independently,  pain was well controlled, was voiding via catheter, and was ready to for discharge.He has follow up with Dr. Abner Greenspan for voiding trial next week.    Laboratory values:  Recent Labs    03/28/21 0719 03/28/21 1100 03/29/21 0140  WBC  --   --  11.4*  HGB 13.9 12.5* 11.9*  HCT 41.0 37.6* 36.2*   Recent Labs    03/28/21 0719 03/28/21 1100 03/29/21 0140  NA 142 137 134*  K 4.4 4.7 4.2  CL 102 104 102  CO2  --  25 24  GLUCOSE 157* 134* 258*  BUN _0 CREATININE 0.80 0.79 0.84  CALCIUM  --  8.9 8.7*   No results for input(s): LABPT, INR in the last 72 hours. No results for input(s): LABURIN in the last 72 hours. Results for orders placed or performed in visit on 03/21/21  SARS Coronavirus 2 (TAT 6-24 hrs)     Status: None   Collection Time: 03/21/21 12:00 AM  Result Value Ref Range Status   SARS Coronavirus 2 RESULT: NEGATIVE  Final    Comment: RESULT:  NEGATIVESARS-CoV-2 INTERPRETATION:A NEGATIVE  test result means that SARS-CoV-2 RNA was not present in the specimen above the limit of detection of this test. This does not preclude a possible SARS-CoV-2 infection and should not be used as the  sole basis for patient management decisions. Negative results must be combined with clinical observations, patient history, and  epidemiological information. Optimum specimen types and timing for peak viral levels during infections caused by SARS-CoV-2  have not been determined. Collection of multiple specimens or types of specimens may be necessary to detect virus. Improper specimen collection and handling, sequence variability under primers/probes, or organism present below the limit of detection may  lead to false negative results. Positive and negative predictive values of testing are highly dependent on prevalence. False negative test results are more likely when prevalence of disease is high.The expected result is NEGATIVE.Fact S heet for  Healthcare Providers: [email or web address].no Sheet for Patients: [email or web address].es Reference Range - Negative     Disposition: Home  Discharge instruction: The patient was instructed to be ambulatory but told to refrain from heavy lifting, strenuous activity, or driving.   Discharge medications:  Allergies as of 03/29/2021       Reactions   Percocet [oxycodone-acetaminophen] Nausea And Vomiting        Medication List     TAKE these medications    Accu-Chek Guide Me w/Device Kit CHECK BLOOD SUGAR DAILY AND AS NEEDED DX E11.9   Accu-Chek Guide test strip Generic drug: glucose blood Check blood sugar daily and prn  Dx E11.9   Accu-Chek Softclix Lancets lancets Check blood sugar daily and prn  Dx E11.9   amLODipine 5 MG tablet Commonly known as: NORVASC Take 1 tablet (5 mg total) by mouth daily.   docusate sodium 100 MG capsule Commonly known as: Colace Take 1 capsule (100 mg total) by mouth daily as needed for up to 30 doses.   glimepiride 4 MG tablet Commonly known as: AMARYL Take 1 tablet (4 mg total) by mouth 2 (two) times daily.   Janumet 50-1000 MG tablet Generic drug: sitaGLIPtin-metformin Take 1 tablet by mouth 2 (two) times daily.   Lantus SoloStar 100 UNIT/ML Solostar Pen Generic drug: insulin  glargine Inject 60 Units into the skin at bedtime. What changed:  how much to take when to take this   lisinopril 20 MG tablet Commonly known as: ZESTRIL Take 1 tablet (20 mg total) by mouth daily.   metoprolol tartrate 25 MG tablet Commonly known as: LOPRESSOR Take 1 tablet (25 mg total) by mouth 2 (two) times daily.   omega-3 acid ethyl esters 1 g capsule Commonly known as: LOVAZA Take 1 capsule (1 g total) by mouth 2 (two) times daily.   ondansetron 4 MG tablet Commonly known as: Zofran Take 1 tablet (4 mg total) by mouth daily as needed for up to 30 doses for nausea or vomiting.   oxyCODONE-acetaminophen 5-325 MG tablet Commonly known as: Percocet Take 1 tablet by mouth every 4 (four) hours as needed for up to 20 doses for severe pain. Notes to patient: Can take next dose at 11am   Pen Needles 31G X 8 MM Misc Use with lantus solosar pen daily   rosuvastatin 40 MG tablet Commonly known as: Crestor Take 0.5 tablets (20 mg total) by mouth daily.   tamsulosin 0.4 MG Caps capsule Commonly known as: FLOMAX Take 1 capsule (0.4 mg total) by mouth daily.        Followup:   Follow-up Information  [facility] Follow up on 04/01/2021.   Why: 8:45AM Contact information: [place] [phone]

## 2021-03-29 NOTE — Progress Notes (Signed)
Patient's night time blood sugar was 315. Patient did not want to take his lantus. He said he only takes it in the morning. On call was made aware and new orders were given for a one time dose of novolog 4 units.

## 2021-03-31 ENCOUNTER — Encounter (HOSPITAL_BASED_OUTPATIENT_CLINIC_OR_DEPARTMENT_OTHER): Payer: Self-pay | Admitting: Urology

## 2021-03-31 LAB — SURGICAL PATHOLOGY

## 2021-04-04 ENCOUNTER — Ambulatory Visit: Payer: Self-pay | Admitting: Nurse Practitioner

## 2021-04-15 ENCOUNTER — Ambulatory Visit: Payer: Medicare Other

## 2021-04-16 ENCOUNTER — Ambulatory Visit (INDEPENDENT_AMBULATORY_CARE_PROVIDER_SITE_OTHER): Payer: Medicare Other

## 2021-04-16 VITALS — Ht 70.0 in | Wt 220.0 lb

## 2021-04-16 DIAGNOSIS — Z0000 Encounter for general adult medical examination without abnormal findings: Secondary | ICD-10-CM | POA: Diagnosis not present

## 2021-04-16 NOTE — Patient Instructions (Signed)
Tyler Garner , Thank you for taking time to come for your Medicare Wellness Visit. I appreciate your ongoing commitment to your health goals. Please review the following plan we discussed and let me know if I can assist you in the future.   Screening recommendations/referrals: Colonoscopy: Return Cologuard soon Recommended yearly ophthalmology/optometry visit for glaucoma screening and checkup Recommended yearly dental visit for hygiene and checkup  Vaccinations: Influenza vaccine: Done 02/18/2021 - Repeat annually  Pneumococcal vaccine: Done 08/04/2016 - due for Pneumovax Tdap vaccine: Done 08/11/2005 - Repeat in 10 years  Shingles vaccine: Due   Covid-19: Done 11/07/2019, 12/05/2019, & 12/04/2020  Advanced directives: Advance directive discussed with you today. Even though you declined this today, please call our office should you change your mind, and we can give you the proper paperwork for you to fill out.   Conditions/risks identified: Aim for 30 minutes of exercise or brisk walking each day, drink 6-8 glasses of water and eat lots of fruits and vegetables.   Next appointment: Follow up in one year for your annual wellness visit.   Preventive Care 66 Years and Older, Male  Preventive care refers to lifestyle choices and visits with your health care provider that can promote health and wellness. What does preventive care include? A yearly physical exam. This is also called an annual well check. Dental exams once or twice a year. Routine eye exams. Ask your health care provider how often you should have your eyes checked. Personal lifestyle choices, including: Daily care of your teeth and gums. Regular physical activity. Eating a healthy diet. Avoiding tobacco and drug use. Limiting alcohol use. Practicing safe sex. Taking low doses of aspirin every day. Taking vitamin and mineral supplements as recommended by your health care provider. What happens during an annual well check? The  services and screenings done by your health care provider during your annual well check will depend on your age, overall health, lifestyle risk factors, and family history of disease. Counseling  Your health care provider may ask you questions about your: Alcohol use. Tobacco use. Drug use. Emotional well-being. Home and relationship well-being. Sexual activity. Eating habits. History of falls. Memory and ability to understand (cognition). Work and work Astronomer. Screening  You may have the following tests or measurements: Height, weight, and BMI. Blood pressure. Lipid and cholesterol levels. These may be checked every 5 years, or more frequently if you are over 80 years old. Skin check. Lung cancer screening. You may have this screening every year starting at age 12 if you have a 30-pack-year history of smoking and currently smoke or have quit within the past 15 years. Fecal occult blood test (FOBT) of the stool. You may have this test every year starting at age 33. Flexible sigmoidoscopy or colonoscopy. You may have a sigmoidoscopy every 5 years or a colonoscopy every 10 years starting at age 43. Prostate cancer screening. Recommendations will vary depending on your family history and other risks. Hepatitis C blood test. Hepatitis B blood test. Sexually transmitted disease (STD) testing. Diabetes screening. This is done by checking your blood sugar (glucose) after you have not eaten for a while (fasting). You may have this done every 1-3 years. Abdominal aortic aneurysm (AAA) screening. You may need this if you are a current or former smoker. Osteoporosis. You may be screened starting at age 31 if you are at high risk. Talk with your health care provider about your test results, treatment options, and if necessary, the need for more  tests. Vaccines  Your health care provider may recommend certain vaccines, such as: Influenza vaccine. This is recommended every year. Tetanus,  diphtheria, and acellular pertussis (Tdap, Td) vaccine. You may need a Td booster every 10 years. Zoster vaccine. You may need this after age 66. Pneumococcal 13-valent conjugate (PCV13) vaccine. One dose is recommended after age 17. Pneumococcal polysaccharide (PPSV23) vaccine. One dose is recommended after age 66. Talk to your health care provider about which screenings and vaccines you need and how often you need them. This information is not intended to replace advice given to you by your health care provider. Make sure you discuss any questions you have with your health care provider. Document Released: 06/07/2015 Document Revised: 01/29/2016 Document Reviewed: 03/12/2015 Elsevier Interactive Patient Education  2017 [place] Prevention in the Home Falls can cause injuries. They can happen to people of all ages. There are many things you can do to make your home safe and to help prevent falls. What can I do on the outside of my home? Regularly fix the edges of walkways and driveways and fix any cracks. Remove anything that might make you trip as you walk through a door, such as a raised step or threshold. Trim any bushes or trees on the path to your home. Use bright outdoor lighting. Clear any walking paths of anything that might make someone trip, such as rocks or tools. Regularly check to see if handrails are loose or broken. Make sure that both sides of any steps have handrails. Any raised decks and porches should have guardrails on the edges. Have any leaves, snow, or ice cleared regularly. Use sand or salt on walking paths during winter. Clean up any spills in your garage right away. This includes oil or grease spills. What can I do in the bathroom? Use night lights. Install grab bars by the toilet and in the tub and shower. Do not use towel bars as grab bars. Use non-skid mats or decals in the tub or shower. If you need to sit down in the shower, use a plastic,  non-slip stool. Keep the floor dry. Clean up any water that spills on the floor as soon as it happens. Remove soap buildup in the tub or shower regularly. Attach bath mats securely with double-sided non-slip rug tape. Do not have throw rugs and other things on the floor that can make you trip. What can I do in the bedroom? Use night lights. Make sure that you have a light by your bed that is easy to reach. Do not use any sheets or blankets that are too big for your bed. They should not hang down onto the floor. Have a firm chair that has side arms. You can use this for support while you get dressed. Do not have throw rugs and other things on the floor that can make you trip. What can I do in the kitchen? Clean up any spills right away. Avoid walking on wet floors. Keep items that you use a lot in easy-to-reach places. If you need to reach something above you, use a strong step stool that has a grab bar. Keep electrical cords out of the way. Do not use floor polish or wax that makes floors slippery. If you must use wax, use non-skid floor wax. Do not have throw rugs and other things on the floor that can make you trip. What can I do with my stairs? Do not leave any items on the stairs. Make sure  that there are handrails on both sides of the stairs and use them. Fix handrails that are broken or loose. Make sure that handrails are as long as the stairways. Check any carpeting to make sure that it is firmly attached to the stairs. Fix any carpet that is loose or worn. Avoid having throw rugs at the top or bottom of the stairs. If you do have throw rugs, attach them to the floor with carpet tape. Make sure that you have a light switch at the top of the stairs and the bottom of the stairs. If you do not have them, ask someone to add them for you. What else can I do to help prevent falls? Wear shoes that: Do not have high heels. Have rubber bottoms. Are comfortable and fit you well. Are closed  at the toe. Do not wear sandals. If you use a stepladder: Make sure that it is fully opened. Do not climb a closed stepladder. Make sure that both sides of the stepladder are locked into place. Ask someone to hold it for you, if possible. Clearly mark and make sure that you can see: Any grab bars or handrails. First and last steps. Where the edge of each step is. Use tools that help you move around (mobility aids) if they are needed. These include: Canes. Walkers. Scooters. Crutches. Turn on the lights when you go into a dark area. Replace any light bulbs as soon as they burn out. Set up your furniture so you have a clear path. Avoid moving your furniture around. If any of your floors are uneven, fix them. If there are any pets around you, be aware of where they are. Review your medicines with your doctor. Some medicines can make you feel dizzy. This can increase your chance of falling. Ask your doctor what other things that you can do to help prevent falls. This information is not intended to replace advice given to you by your health care provider. Make sure you discuss any questions you have with your health care provider. Document Released: 03/07/2009 Document Revised: 10/17/2015 Document Reviewed: 06/15/2014 Elsevier Interactive Patient Education  2017 [employer].

## 2021-04-16 NOTE — Progress Notes (Signed)
Subjective:   Tyler Garner is a 65 y.o. male who presents for an Initial Medicare Annual Wellness Visit.  Virtual Visit via Telephone Note  I connected with  Tyler Garner on 04/16/21 at 11:15 AM EST by telephone and verified that I am speaking with the correct person using two identifiers.  Location: Patient: Home Provider: [facility] Persons participating in the virtual visit: patient/Nurse Health Advisor   I discussed the limitations, risks, security and privacy concerns of performing an evaluation and management service by telephone and the availability of in person appointments. The patient expressed understanding and agreed to proceed.  Interactive audio and video telecommunications were attempted between this nurse and patient, however failed, due to patient having technical difficulties OR patient did not have access to video capability.  We continued and completed visit with audio only.  Some vital signs may be absent or patient reported.   Amy E Hopkins, LPN   Review of Systems     Cardiac Risk Factors include: advanced age (>88mn, >>73women);diabetes mellitus;obesity (BMI >30kg/m2);sedentary lifestyle;smoking/ tobacco exposure;male gender;dyslipidemia;hypertension     Objective:    Today's Vitals   04/16/21 1119 04/16/21 1121  Weight: 220 lb (99.8 kg)   Height: 5' 10" (1.778 m)   PainSc:  0-No pain   Body mass index is 31.57 kg/m.  Advanced Directives 04/16/2021 03/28/2021 01/09/2021 11/30/2020 11/28/2020 11/28/2020 11/26/2020  Does Patient Have a Medical Advance Directive? Yes Yes Yes No No No No  Type of [place] Living will - - - - -  Copy of [place]? No - copy requested - - - - - -  Would patient like information on creating a medical advance directive? - - - No - Patient declined No - Patient declined No - Patient declined No - Patient declined    Current Medications  (verified) Outpatient Encounter Medications as of 04/16/2021  Medication Sig   Accu-Chek Softclix Lancets lancets Check blood sugar daily and prn  Dx E11.9   amLODipine (NORVASC) 5 MG tablet Take 1 tablet (5 mg total) by mouth daily.   Blood Glucose Monitoring Suppl (ACCU-CHEK GUIDE ME) w/Device KIT CHECK BLOOD SUGAR DAILY AND AS NEEDED DX E11.9   docusate sodium (COLACE) 100 MG capsule Take 1 capsule (100 mg total) by mouth daily as needed for up to 30 doses.   glimepiride (AMARYL) 4 MG tablet Take 1 tablet (4 mg total) by mouth 2 (two) times daily.   glucose blood (ACCU-CHEK GUIDE) test strip Check blood sugar daily and prn  Dx E11.9   insulin glargine (LANTUS SOLOSTAR) 100 UNIT/ML Solostar Pen Inject 60 Units into the skin at bedtime. (Patient taking differently: Inject 55 Units into the skin daily.)   Insulin Pen Needle (PEN NEEDLES) 31G X 8 MM MISC Use with lantus solosar pen daily   JANUMET 50-1000 MG tablet Take 1 tablet by mouth 2 (two) times daily.   lisinopril (ZESTRIL) 20 MG tablet Take 1 tablet (20 mg total) by mouth daily.   metoprolol tartrate (LOPRESSOR) 25 MG tablet Take 1 tablet (25 mg total) by mouth 2 (two) times daily.   omega-3 acid ethyl esters (LOVAZA) 1 g capsule Take 1 capsule (1 g total) by mouth 2 (two) times daily.   ondansetron (ZOFRAN) 4 MG tablet Take 1 tablet (4 mg total) by mouth daily as needed for up to 30 doses for nausea or vomiting.   oxyCODONE-acetaminophen (PERCOCET) 5-325 MG tablet  Take 1 tablet by mouth every 4 (four) hours as needed for up to 20 doses for severe pain.   rosuvastatin (CRESTOR) 40 MG tablet Take 0.5 tablets (20 mg total) by mouth daily.   tamsulosin (FLOMAX) 0.4 MG CAPS capsule Take 1 capsule (0.4 mg total) by mouth daily.   No facility-administered encounter medications on file as of 04/16/2021.    Allergies (verified) Percocet [oxycodone-acetaminophen]   History: Past Medical History:  Diagnosis Date   Arthritis    hands and  hips oa   CAD (coronary artery disease)    a. inferior STEMI (05/2013) with RV involvement- LHC:  dist CFX occluded (Promus premier (2.5x24 mm) DES); EF 40-45% with inf HK;  b. Echo (report pending) with EF 45-50%, lat WMA, RVE, mod RV hypokinesis    Complication of anesthesia 11/28/2020   confusion x 1 hour after 11-28-2020 back surgery   Foley catheter in place    since 11-28-2020 foley last changed 1 week ago as of 03-25-2021   History of kidney stones    Hyperlipidemia    Hypertension    Ischemic cardiomyopathy    Myocardial infarction ([place]) 2015   Nicotine addiction    Renal colic    Right shoulder injury 10/11/2009   Type 2 DM    Urinary retention    since july 2022   Past Surgical History:  Procedure Laterality Date   heart stent  06/21/2013   x 1 to distal LCX   LEFT HEART CATHETERIZATION WITH CORONARY ANGIOGRAM N/A 06/21/2013   Procedure: LEFT HEART CATHETERIZATION WITH CORONARY ANGIOGRAM;  Surgeon: Peter M Martinique, MD;  Location: [facility] CATH LAB;  Service: Cardiovascular;  Laterality: N/A;   LUMBAR LAMINECTOMY/DECOMPRESSION MICRODISCECTOMY N/A 11/28/2020   Procedure: LUMBAR DECOMPRESSION  2 LEVELS, L3 and L5;  Surgeon: Melina Schools, MD;  Location: [facility];  Service: Orthopedics;  Laterality: N/A;  3.5 hrs   TRANSURETHRAL RESECTION OF PROSTATE N/A 03/28/2021   Procedure: TRANSURETHRAL RESECTION OF THE PROSTATE (TURP)/ BIPOLAR;  Surgeon: Janith Lima, MD;  Location: [facility];  Service: Urology;  Laterality: N/A;   Family History  Problem Relation Age of Onset   Diabetes Mother    ALS Mother    Alzheimer's disease Father    Healthy Sister    Diabetes Brother    Social History   Socioeconomic History   Marital status: Widowed    Spouse name: Not on file   Number of children: 2   Years of education: Not on file   Highest education level: Not on file  Occupational History   Occupation: Retired  Tobacco Use   Smoking status: Former    Packs/day: 1.00     Years: 40.00    Pack years: 40.00    Types: Cigarettes    Quit date: 05/25/2010    Years since quitting: 10.9   Smokeless tobacco: Former    Quit date: 05/25/2008  Vaping Use   Vaping Use: Never used  Substance and Sexual Activity   Alcohol use: Not Currently   Drug use: No   Sexual activity: Not on file  Other Topics Concern   Not on file  Social History Narrative   Lives alone. 2 children: One in [place], another in [place] Strain: Low Risk    Difficulty of Paying Living Expenses: Not hard at all  Food Insecurity: No Food Insecurity   Worried About Charity fundraiser in the Last Year:  Never true   Ran Out of Food in the Last Year: Never true  Transportation Needs: No Transportation Needs   Lack of Transportation (Medical): No   Lack of Transportation (Non-Medical): No  Physical Activity: Insufficiently Active   Days of Exercise per Week: 7 days   Minutes of Exercise per Session: 20 min  Stress: No Stress Concern Present   Feeling of Stress : Not at all  Social Connections: Moderately Isolated   Frequency of Communication with Friends and Family: More than three times a week   Frequency of Social Gatherings with Friends and Family: More than three times a week   Attends Religious Services: Never   Marine scientist or Organizations: Yes   Attends Archivist Meetings: 1 to 4 times per year   Marital Status: Widowed    Tobacco Counseling Counseling given: Not Answered   Clinical Intake:  Pre-visit preparation completed: Yes  Pain : No/denies pain Pain Score: 0-No pain     BMI - recorded: 31.57 Nutritional Status: BMI > 30  Obese Nutritional Risks: None Diabetes: Yes CBG done?: No Did pt. bring in CBG monitor from home?: No  How often do you need to have someone help you when you read instructions, pamphlets, or other written materials from your doctor or pharmacy?: 1 - Never  Diabetic?  Yes Nutrition Risk Assessment:  Has the patient had any N/V/D within the last 2 months?  No  Does the patient have any non-healing wounds?  No  Has the patient had any unintentional weight loss or weight gain?  No   Diabetes:  Is the patient diabetic?  Yes  If diabetic, was a CBG obtained today?  No  Did the patient bring in their glucometer from home?  No  How often do you monitor your CBG's? Once daily fasting.   Financial Strains and Diabetes Management:  Are you having any financial strains with the device, your supplies or your medication? No .  Does the patient want to be seen by Chronic Care Management for management of their diabetes?  No  Would the patient like to be referred to a Nutritionist or for Diabetic Management?  No   Diabetic Exams:  Diabetic Eye Exam: Overdue for diabetic eye exam. Pt has been advised about the importance in completing this exam. Declined referral  Diabetic Foot Exam: Completed 02/18/2021. Pt has been advised about the importance in completing this exam. Pt is scheduled for diabetic foot exam on next year.    Interpreter Needed?: No  Information entered by :: Mortimer Bair, LPN   Activities of Daily Living In your present state of health, do you have any difficulty performing the following activities: 04/16/2021 03/28/2021  Hearing? N N  Vision? N N  Difficulty concentrating or making decisions? N N  Walking or climbing stairs? N N  Dressing or bathing? N N  Doing errands, shopping? N -  Preparing Food and eating ? N -  Using the Toilet? N -  In the past six months, have you accidently leaked urine? N -  Comment had prostate cancer; surgery -  Do you have problems with loss of bowel control? N -  Managing your Medications? N -  Managing your Finances? N -  Housekeeping or managing your Housekeeping? N -  Some recent data might be hidden    Patient Care Team: Chevis Pretty, [name] as PCP - General (Nurse Practitioner)  Indicate  any recent Medical Services you may have received  from other than [facility] providers in the past year (date may be approximate).     Assessment:   This is a routine wellness examination for Tyler Garner.  Hearing/Vision screen Hearing Screening - Comments:: Denies hearing difficulties  Vision Screening - Comments:: Denies vision difficulties - no eye doctor  Dietary issues and exercise activities discussed: Current Exercise Habits: The patient does not participate in regular exercise at present, Exercise limited by: orthopedic condition(s)   Goals Addressed             This Visit's Progress    LIFESTYLE - DECREASE FALLS RISK         Depression Screen PHQ 2/9 Scores 04/16/2021 02/18/2021 11/18/2020 08/13/2020 05/28/2020 02/22/2020 11/16/2019  PHQ - 2 Score 0 0 1 0 0 0 0  PHQ- 9 Score _0 - - - -    Fall Risk Fall Risk  04/16/2021 02/18/2021 11/18/2020 08/13/2020 05/28/2020  Falls in the past year? 1 0 0 1 0  Number falls in past yr: 1 - - 0 -  Injury with Fall? 0 - - 0 -  Risk for fall due to : History of fall(s);Orthopedic patient - - History of fall(s) -  Follow up Education provided;Falls prevention discussed - - Education provided -    FALL RISK PREVENTION PERTAINING TO THE HOME:  Any stairs in or around the home? Yes  If so, are there any without handrails? No  Home free of loose throw rugs in walkways, pet beds, electrical cords, etc? Yes  Adequate lighting in your home to reduce risk of falls? Yes   ASSISTIVE DEVICES UTILIZED TO PREVENT FALLS:  Life alert? No  Use of a cane, walker or w/c? No  Grab bars in the bathroom? Yes  Shower chair or bench in shower? Yes  Elevated toilet seat or a handicapped toilet? Yes   TIMED UP AND GO:  Was the test performed? No . Telephonic visit  Cognitive Function:     6CIT Screen 04/16/2021  What Year? 0 points  What month? 0 points  What time? 0 points  Count back from 20 0 points  Months in reverse 0 points  Repeat phrase 0 points   Total Score 0    Immunizations Immunization History  Administered Date(s) Administered   Fluad Quad(high Dose 65+) 02/22/2020, 02/18/2021   Influenza Inj Mdck Quad Pf 03/04/2017   Influenza,inj,Quad PF,6+ Mos 02/21/2013, 04/05/2014, 05/24/2015, 04/07/2016, 03/03/2018, 01/31/2019   Influenza-Unspecified 02/22/2017   Moderna SARS-COV2 Booster Vaccination 12/04/2020   Moderna Sars-Covid-2 Vaccination 11/07/2019, 12/05/2019   Pneumococcal Conjugate-13 08/04/2016   Td 08/11/2005    TDAP status: Due, Education has been provided regarding the importance of this vaccine. Advised may receive this vaccine at local pharmacy or Health Dept. Aware to provide a copy of the vaccination record if obtained from local pharmacy or Health Dept. Verbalized acceptance and understanding.  Flu Vaccine status: Up to date  Pneumococcal vaccine status: Due, Education has been provided regarding the importance of this vaccine. Advised may receive this vaccine at local pharmacy or Health Dept. Aware to provide a copy of the vaccination record if obtained from local pharmacy or Health Dept. Verbalized acceptance and understanding.  Covid-19 vaccine status: Completed vaccines  Qualifies for Shingles Vaccine? Yes   Zostavax completed No   Shingrix Completed?: No.    Education has been provided regarding the importance of this vaccine. Patient has been advised to call insurance company to determine out of pocket expense if they have not  yet received this vaccine. Advised may also receive vaccine at local pharmacy or Health Dept. Verbalized acceptance and understanding.  Screening Tests Health Maintenance  Topic Date Due   OPHTHALMOLOGY EXAM  Never done   Zoster Vaccines- Shingrix (1 of 2) Never done   Fecal DNA (Cologuard)  Never done   Pneumonia Vaccine 15+ Years old (2 - PPSV23 if available, else PCV20) 08/04/2017   COVID-19 Vaccine (3 - Moderna risk series) 01/01/2021   TETANUS/TDAP  05/28/2021 (Originally  08/12/2015)   HEMOGLOBIN A1C  08/18/2021   FOOT EXAM  02/18/2022   INFLUENZA VACCINE  Completed   Hepatitis C Screening  Completed   HPV VACCINES  Aged Out    Health Maintenance  Health Maintenance Due  Topic Date Due   OPHTHALMOLOGY EXAM  Never done   Zoster Vaccines- Shingrix (1 of 2) Never done   Fecal DNA (Cologuard)  Never done   Pneumonia Vaccine 70+ Years old (2 - PPSV23 if available, else PCV20) 08/04/2017   COVID-19 Vaccine (3 - Moderna risk series) 01/01/2021    Colorectal cancer screening: Cologuard ordered 02/18/2021  Lung Cancer Screening: (Low Dose CT Chest recommended if Age 45-80 years, 30 pack-year currently smoking OR have quit w/in 15years.) does qualify.   Lung Cancer Screening Referral: declined  Additional Screening:  Hepatitis C Screening: does qualify; Completed 11/25/2015  Vision Screening: Recommended annual ophthalmology exams for early detection of glaucoma and other disorders of the eye. Is the patient up to date with their annual eye exam?  No  Who is the provider or what is the name of the office in which the patient attends annual eye exams? none If pt is not established with a provider, would they like to be referred to a provider to establish care? No .   Dental Screening: Recommended annual dental exams for proper oral hygiene  Community Resource Referral / Chronic Care Management: CRR required this visit?  No   CCM required this visit?  No      Plan:     I have personally reviewed and noted the following in the patient's chart:   Medical and social history Use of alcohol, tobacco or illicit drugs  Current medications and supplements including opioid prescriptions. Patient is not currently taking opioid prescriptions. Functional ability and status Nutritional status Physical activity Advanced directives List of other physicians Hospitalizations, surgeries, and ER visits in previous 12 months Vitals Screenings to include  cognitive, depression, and falls Referrals and appointments  In addition, I have reviewed and discussed with patient certain preventive protocols, quality metrics, and best practice recommendations. A written personalized care plan for preventive services as well as general preventive health recommendations were provided to patient.     Sandrea Hammond, LPN   [id number]   Nurse Notes: None

## 2021-05-20 ENCOUNTER — Ambulatory Visit (INDEPENDENT_AMBULATORY_CARE_PROVIDER_SITE_OTHER): Payer: Medicare Other | Admitting: Nurse Practitioner

## 2021-05-20 ENCOUNTER — Encounter: Payer: Self-pay | Admitting: Nurse Practitioner

## 2021-05-20 VITALS — BP 138/82 | HR 68 | Temp 98.9°F | Resp 20 | Ht 70.0 in | Wt 225.0 lb

## 2021-05-20 DIAGNOSIS — Z794 Long term (current) use of insulin: Secondary | ICD-10-CM | POA: Diagnosis not present

## 2021-05-20 DIAGNOSIS — G8929 Other chronic pain: Secondary | ICD-10-CM

## 2021-05-20 DIAGNOSIS — E7800 Pure hypercholesterolemia, unspecified: Secondary | ICD-10-CM | POA: Diagnosis not present

## 2021-05-20 DIAGNOSIS — I2510 Atherosclerotic heart disease of native coronary artery without angina pectoris: Secondary | ICD-10-CM | POA: Diagnosis not present

## 2021-05-20 DIAGNOSIS — E1142 Type 2 diabetes mellitus with diabetic polyneuropathy: Secondary | ICD-10-CM

## 2021-05-20 DIAGNOSIS — E119 Type 2 diabetes mellitus without complications: Secondary | ICD-10-CM

## 2021-05-20 DIAGNOSIS — I5022 Chronic systolic (congestive) heart failure: Secondary | ICD-10-CM

## 2021-05-20 DIAGNOSIS — E876 Hypokalemia: Secondary | ICD-10-CM

## 2021-05-20 DIAGNOSIS — I10 Essential (primary) hypertension: Secondary | ICD-10-CM | POA: Diagnosis not present

## 2021-05-20 DIAGNOSIS — M545 Low back pain, unspecified: Secondary | ICD-10-CM

## 2021-05-20 DIAGNOSIS — I259 Chronic ischemic heart disease, unspecified: Secondary | ICD-10-CM

## 2021-05-20 DIAGNOSIS — I25111 Atherosclerotic heart disease of native coronary artery with angina pectoris with documented spasm: Secondary | ICD-10-CM | POA: Diagnosis not present

## 2021-05-20 DIAGNOSIS — I240 Acute coronary thrombosis not resulting in myocardial infarction: Secondary | ICD-10-CM | POA: Diagnosis not present

## 2021-05-20 DIAGNOSIS — I252 Old myocardial infarction: Secondary | ICD-10-CM

## 2021-05-20 DIAGNOSIS — IMO0002 Reserved for concepts with insufficient information to code with codable children: Secondary | ICD-10-CM

## 2021-05-20 DIAGNOSIS — N400 Enlarged prostate without lower urinary tract symptoms: Secondary | ICD-10-CM

## 2021-05-20 DIAGNOSIS — Z125 Encounter for screening for malignant neoplasm of prostate: Secondary | ICD-10-CM

## 2021-05-20 LAB — CBC WITH DIFFERENTIAL/PLATELET
Basophils Absolute: 0.1 10*3/uL (ref 0.0–0.2)
Basos: 1 %
EOS (ABSOLUTE): 0.2 10*3/uL (ref 0.0–0.4)
Eos: 2 %
Hematocrit: 40 % (ref 37.5–51.0)
Hemoglobin: 13.1 g/dL (ref 13.0–17.7)
Immature Grans (Abs): 0 10*3/uL (ref 0.0–0.1)
Immature Granulocytes: 1 %
Lymphocytes Absolute: 3.5 10*3/uL — ABNORMAL HIGH (ref 0.7–3.1)
Lymphs: 41 %
MCH: 26.6 pg (ref 26.6–33.0)
MCHC: 32.8 g/dL (ref 31.5–35.7)
MCV: 81 fL (ref 79–97)
Monocytes Absolute: 0.7 10*3/uL (ref 0.1–0.9)
Monocytes: 8 %
Neutrophils Absolute: 4 10*3/uL (ref 1.4–7.0)
Neutrophils: 47 %
Platelets: 288 10*3/uL (ref 150–450)
RBC: 4.92 x10E6/uL (ref 4.14–5.80)
RDW: 16.2 % — ABNORMAL HIGH (ref 11.6–15.4)
WBC: 8.4 10*3/uL (ref 3.4–10.8)

## 2021-05-20 LAB — CMP14+EGFR
ALT: 11 IU/L (ref 0–44)
AST: 16 IU/L (ref 0–40)
Albumin/Globulin Ratio: 2.2 (ref 1.2–2.2)
Albumin: 4.7 g/dL (ref 3.8–4.8)
Alkaline Phosphatase: 57 IU/L (ref 44–121)
BUN/Creatinine Ratio: 13 (ref 10–24)
BUN: 12 mg/dL (ref 8–27)
Bilirubin Total: 0.2 mg/dL (ref 0.0–1.2)
CO2: 24 mmol/L (ref 20–29)
Calcium: 10 mg/dL (ref 8.6–10.2)
Chloride: 107 mmol/L — ABNORMAL HIGH (ref 96–106)
Creatinine, Ser: 0.89 mg/dL (ref 0.76–1.27)
Globulin, Total: 2.1 g/dL (ref 1.5–4.5)
Glucose: 98 mg/dL (ref 70–99)
Potassium: 5.6 mmol/L — ABNORMAL HIGH (ref 3.5–5.2)
Sodium: 145 mmol/L — ABNORMAL HIGH (ref 134–144)
Total Protein: 6.8 g/dL (ref 6.0–8.5)
eGFR: 95 mL/min/{1.73_m2} (ref >59)

## 2021-05-20 LAB — LIPID PANEL
Chol/HDL Ratio: 6.5 ratio — ABNORMAL HIGH (ref 0.0–5.0)
Cholesterol, Total: 149 mg/dL (ref 100–199)
HDL: 23 mg/dL — ABNORMAL LOW (ref >39)
LDL Chol Calc (NIH): 71 mg/dL (ref 0–99)
Triglycerides: 344 mg/dL — ABNORMAL HIGH (ref 0–149)
VLDL Cholesterol Cal: 55 mg/dL — ABNORMAL HIGH (ref 5–40)

## 2021-05-20 LAB — BAYER DCA HB A1C WAIVED: HB A1C (BAYER DCA - WAIVED): 7.4 % — ABNORMAL HIGH (ref 4.8–5.6)

## 2021-05-20 MED ORDER — ROSUVASTATIN CALCIUM 40 MG PO TABS: 20.0000 mg | ORAL_TABLET | Freq: Every day | ORAL | 1 refills | Status: DC

## 2021-05-20 MED ORDER — GLIMEPIRIDE 4 MG PO TABS: 4.0000 mg | ORAL_TABLET | Freq: Two times a day (BID) | ORAL | 1 refills | Status: DC

## 2021-05-20 MED ORDER — CELECOXIB 200 MG PO CAPS: 200.0000 mg | ORAL_CAPSULE | Freq: Two times a day (BID) | ORAL | 5 refills | Status: DC

## 2021-05-20 MED ORDER — JANUMET 50-1000 MG PO TABS: 1.0000 | ORAL_TABLET | Freq: Two times a day (BID) | ORAL | 1 refills | Status: DC

## 2021-05-20 MED ORDER — AMLODIPINE BESYLATE 5 MG PO TABS: 5.0000 mg | ORAL_TABLET | Freq: Every day | ORAL | 1 refills | Status: DC

## 2021-05-20 MED ORDER — TAMSULOSIN HCL 0.4 MG PO CAPS: 0.4000 mg | ORAL_CAPSULE | Freq: Every day | ORAL | 1 refills | Status: DC

## 2021-05-20 MED ORDER — METOPROLOL TARTRATE 25 MG PO TABS: 25.0000 mg | ORAL_TABLET | Freq: Two times a day (BID) | ORAL | 1 refills | Status: DC

## 2021-05-20 MED ORDER — OMEGA-3-ACID ETHYL ESTERS 1 G PO CAPS: 1.0000 g | ORAL_CAPSULE | Freq: Two times a day (BID) | ORAL | 3 refills | Status: DC

## 2021-05-20 MED ORDER — LANTUS SOLOSTAR 100 UNIT/ML ~~LOC~~ SOPN: 60.0000 [IU] | PEN_INJECTOR | Freq: Every day | SUBCUTANEOUS | 3 refills | Status: DC

## 2021-05-20 MED ORDER — LISINOPRIL 20 MG PO TABS: 20.0000 mg | ORAL_TABLET | Freq: Every day | ORAL | 1 refills | Status: DC

## 2021-05-20 NOTE — Progress Notes (Signed)
Subjective:    Patient ID: Tyler Garner, male    DOB: [birth year], 65 y.o.   MRN: [id number]   Chief Complaint: Medical Management of Chronic Issues    HPI:  Tyler Garner is a 65 y.o. who identifies as a male who was assigned male at birth.   Social history: Lives with: his son Work history: disablity   Comes in today for follow up of the following chronic medical issues:  1. Primary hypertension No c/o chest pain, sob or headache. Does not check blood pressure at home. BP Readings from Last 3 Encounters:  05/20/21 (!) 167/78  03/29/21 134/73  03/17/21 140/76     2. Pure hypercholesterolemia Does try to watch diet but does no dedicated exercise. Lab Results  Component Value Date   CHOL 135 02/18/2021   HDL 23 (L) 02/18/2021   LDLCALC 65 02/18/2021   TRIG 295 (H) 02/18/2021   CHOLHDL 5.9 (H) 02/18/2021     3. Type 2 diabetes mellitus with diabetic polyneuropathy, with long-term current use of insulin (HCC) Fasting blood sugar are around 120-140. No low blood sugars. Lab Results  Component Value Date   HGBA1C 6.2 (H) 02/18/2021     4. Ischemic heart disease due to coronary artery obstruction ([place]) 5. Chronic systolic CHF (congestive heart failure) ([place]) Last saw cardiology on 03/17/21. No change to plan of care. Had myocardial perfusion test on 03/19/21 which was good.  6.  Hypokalemia No lower ext cramping Lab Results  Component Value Date   K 4.2 03/29/2021     7. Hx of myocardial infarction No recent chest pain  9. Chronic midline low back pain without sciatica Has chronic back pain. Just takes OTC meds  10. Benign prostatic hyperplasia without lower urinary tract symptoms No problems voiding   New complaints: Arthritis bil hands. Painful to grip  Allergies  Allergen Reactions   Percocet [Oxycodone-Acetaminophen] Nausea And Vomiting   Outpatient Encounter Medications as of 05/20/2021  Medication Sig   Accu-Chek Softclix Lancets  lancets Check blood sugar daily and prn  Dx E11.9   amLODipine (NORVASC) 5 MG tablet Take 1 tablet (5 mg total) by mouth daily.   Blood Glucose Monitoring Suppl (ACCU-CHEK GUIDE ME) w/Device KIT CHECK BLOOD SUGAR DAILY AND AS NEEDED DX E11.9   docusate sodium (COLACE) 100 MG capsule Take 1 capsule (100 mg total) by mouth daily as needed for up to 30 doses.   glimepiride (AMARYL) 4 MG tablet Take 1 tablet (4 mg total) by mouth 2 (two) times daily.   glucose blood (ACCU-CHEK GUIDE) test strip Check blood sugar daily and prn  Dx E11.9   insulin glargine (LANTUS SOLOSTAR) 100 UNIT/ML Solostar Pen Inject 60 Units into the skin at bedtime. (Patient taking differently: Inject 55 Units into the skin daily.)   Insulin Pen Needle (PEN NEEDLES) 31G X 8 MM MISC Use with lantus solosar pen daily   JANUMET 50-1000 MG tablet Take 1 tablet by mouth 2 (two) times daily.   lisinopril (ZESTRIL) 20 MG tablet Take 1 tablet (20 mg total) by mouth daily.   metoprolol tartrate (LOPRESSOR) 25 MG tablet Take 1 tablet (25 mg total) by mouth 2 (two) times daily.   omega-3 acid ethyl esters (LOVAZA) 1 g capsule Take 1 capsule (1 g total) by mouth 2 (two) times daily.   ondansetron (ZOFRAN) 4 MG tablet Take 1 tablet (4 mg total) by mouth daily as needed for up to 30 doses for nausea or  vomiting.   rosuvastatin (CRESTOR) 40 MG tablet Take 0.5 tablets (20 mg total) by mouth daily.   tamsulosin (FLOMAX) 0.4 MG CAPS capsule Take 1 capsule (0.4 mg total) by mouth daily.   [DISCONTINUED] oxyCODONE-acetaminophen (PERCOCET) 5-325 MG tablet Take 1 tablet by mouth every 4 (four) hours as needed for up to 20 doses for severe pain.   No facility-administered encounter medications on file as of 05/20/2021.    Past Surgical History:  Procedure Laterality Date   heart stent  06/21/2013   x 1 to distal LCX   LEFT HEART CATHETERIZATION WITH CORONARY ANGIOGRAM N/A 06/21/2013   Procedure: LEFT HEART CATHETERIZATION WITH CORONARY ANGIOGRAM;   Surgeon: Peter M Martinique, MD;  Location: [facility] CATH LAB;  Service: Cardiovascular;  Laterality: N/A;   LUMBAR LAMINECTOMY/DECOMPRESSION MICRODISCECTOMY N/A 11/28/2020   Procedure: LUMBAR DECOMPRESSION  2 LEVELS, L3 and L5;  Surgeon: Melina Schools, MD;  Location: [place];  Service: Orthopedics;  Laterality: N/A;  3.5 hrs   TRANSURETHRAL RESECTION OF PROSTATE N/A 03/28/2021   Procedure: TRANSURETHRAL RESECTION OF THE PROSTATE (TURP)/ BIPOLAR;  Surgeon: Janith Lima, MD;  Location: [facility];  Service: Urology;  Laterality: N/A;    Family History  Problem Relation Age of Onset   Diabetes Mother    ALS Mother    Alzheimer's disease Father    Healthy Sister    Diabetes Brother       Controlled substance contract: n/a      Review of Systems  Constitutional:  Negative for diaphoresis.  Eyes:  Negative for pain.  Respiratory:  Negative for shortness of breath.   Cardiovascular:  Negative for chest pain, palpitations and leg swelling.  Gastrointestinal:  Negative for abdominal pain.  Endocrine: Negative for polydipsia.  Musculoskeletal:  Positive for arthralgias and back pain.  Skin:  Negative for rash.  Neurological:  Negative for dizziness, weakness and headaches.  Hematological:  Does not bruise/bleed easily.  All other systems reviewed and are negative.     Objective:   Physical Exam Vitals and nursing note reviewed.  Constitutional:      Appearance: Normal appearance. He is well-developed.  HENT:     Head: Normocephalic.     Nose: Nose normal.     Mouth/Throat:     Mouth: Mucous membranes are moist.     Pharynx: Oropharynx is clear.  Eyes:     Pupils: Pupils are equal, round, and reactive to light.  Neck:     Thyroid: No thyroid mass or thyromegaly.     Vascular: No carotid bruit or JVD.     Trachea: Phonation normal.  Cardiovascular:     Rate and Rhythm: Normal rate and regular rhythm.  Pulmonary:     Effort: Pulmonary effort is normal. No  respiratory distress.     Breath sounds: Normal breath sounds.  Abdominal:     General: Bowel sounds are normal.     Palpations: Abdomen is soft.     Tenderness: There is no abdominal tenderness.  Musculoskeletal:        General: Normal range of motion.     Cervical back: Normal range of motion and neck supple.  Lymphadenopathy:     Cervical: No cervical adenopathy.  Skin:    General: Skin is warm and dry.  Neurological:     Mental Status: He is alert and oriented to person, place, and time.  Psychiatric:        Behavior: Behavior normal.  Thought Content: Thought content normal.        Judgment: Judgment normal.    BP 138/82 (BP Location: Left Arm, Cuff Size: Normal)    Pulse 68    Temp 98.9 F (37.2 C) (Temporal)    Resp 20    Ht 5' 10" (1.778 m)    Wt 225 lb (102.1 kg)    SpO2 98%    BMI 32.28 kg/m   HGBA1c 7.4%       Assessment & Plan:  Tyler Garner comes in today with chief complaint of Medical Management of Chronic Issues   Diagnosis and orders addressed:  1. Primary hypertension Low sodium diet - CBC with Differential/Platelet - CMP14+EGFR - amLODipine (NORVASC) 5 MG tablet; Take 1 tablet (5 mg total) by mouth daily.  Dispense: 90 tablet; Refill: 1 - lisinopril (ZESTRIL) 20 MG tablet; Take 1 tablet (20 mg total) by mouth daily.  Dispense: 90 tablet; Refill: 1  2. Pure hypercholesterolemia Low fat diet - Lipid panel - rosuvastatin (CRESTOR) 40 MG tablet; Take 0.5 tablets (20 mg total) by mouth daily.  Dispense: 135 tablet; Refill: 1  3. Type 2 diabetes mellitus with diabetic polyneuropathy, with long-term current use of insulin (HCC) Continue to watch carbs in diet - Bayer DCA Hb A1c Waived - insulin glargine (LANTUS SOLOSTAR) 100 UNIT/ML Solostar Pen; Inject 60 Units into the skin at bedtime.  Dispense: 30 mL; Refill: 3 - glimepiride (AMARYL) 4 MG tablet; Take 1 tablet (4 mg total) by mouth 2 (two) times daily.  Dispense: 180 tablet; Refill: 1 -  JANUMET 50-1000 MG tablet; Take 1 tablet by mouth 2 (two) times daily.  Dispense: 180 tablet; Refill: 1  4. Ischemic heart disease due to coronary artery obstruction [facility]) Keep follow up with cardiology  5. Chronic systolic CHF (congestive heart failure) (HCC) Watch fluid intake  6. Hypokalemia Labs pending  7. Hx of myocardial infarction  9. Chronic midline low back pain without sciatica Moist heat rest - celecoxib (CELEBREX) 200 MG capsule; Take 1 capsule (200 mg total) by mouth 2 (two) times daily.  Dispense: 60 capsule; Refill: 5  10. Benign prostatic hyperplasia without lower urinary tract symptoms - tamsulosin (FLOMAX) 0.4 MG CAPS capsule; Take 1 capsule (0.4 mg total) by mouth daily.  Dispense: 90 capsule; Refill: 1  11. Atherosclerosis of native coronary artery of native heart with angina pectoris with documented spasm ([place]) Needs to follow up with cardiology - metoprolol tartrate (LOPRESSOR) 25 MG tablet; Take 1 tablet (25 mg total) by mouth 2 (two) times daily.  Dispense: 180 tablet; Refill: 1  12. Atherosclerosis of native coronary artery of native heart without angina pectoris - omega-3 acid ethyl esters (LOVAZA) 1 g capsule; Take 1 capsule (1 g total) by mouth 2 (two) times daily.  Dispense: 180 capsule; Refill: 3    Labs pending Health Maintenance reviewed Diet and exercise encouraged  Follow up plan: 3 months   [name] Hassell Done, [name]

## 2021-05-20 NOTE — Patient Instructions (Signed)

## 2021-05-23 ENCOUNTER — Other Ambulatory Visit: Payer: Self-pay | Admitting: Nurse Practitioner

## 2021-05-23 DIAGNOSIS — I10 Essential (primary) hypertension: Secondary | ICD-10-CM

## 2021-06-24 ENCOUNTER — Ambulatory Visit: Payer: Medicare Other | Admitting: Cardiovascular Disease

## 2021-08-17 IMAGING — DX DG LUMBAR SPINE 2-3V
4 series · 4 of 4 positions shown · non-contrast
Comparison: 06/10/2015

CLINICAL DATA: Right lower extremity pain, back pain

EXAM:
LUMBAR SPINE - 2-3 VIEW

[l-spine ap (1 of 2)]
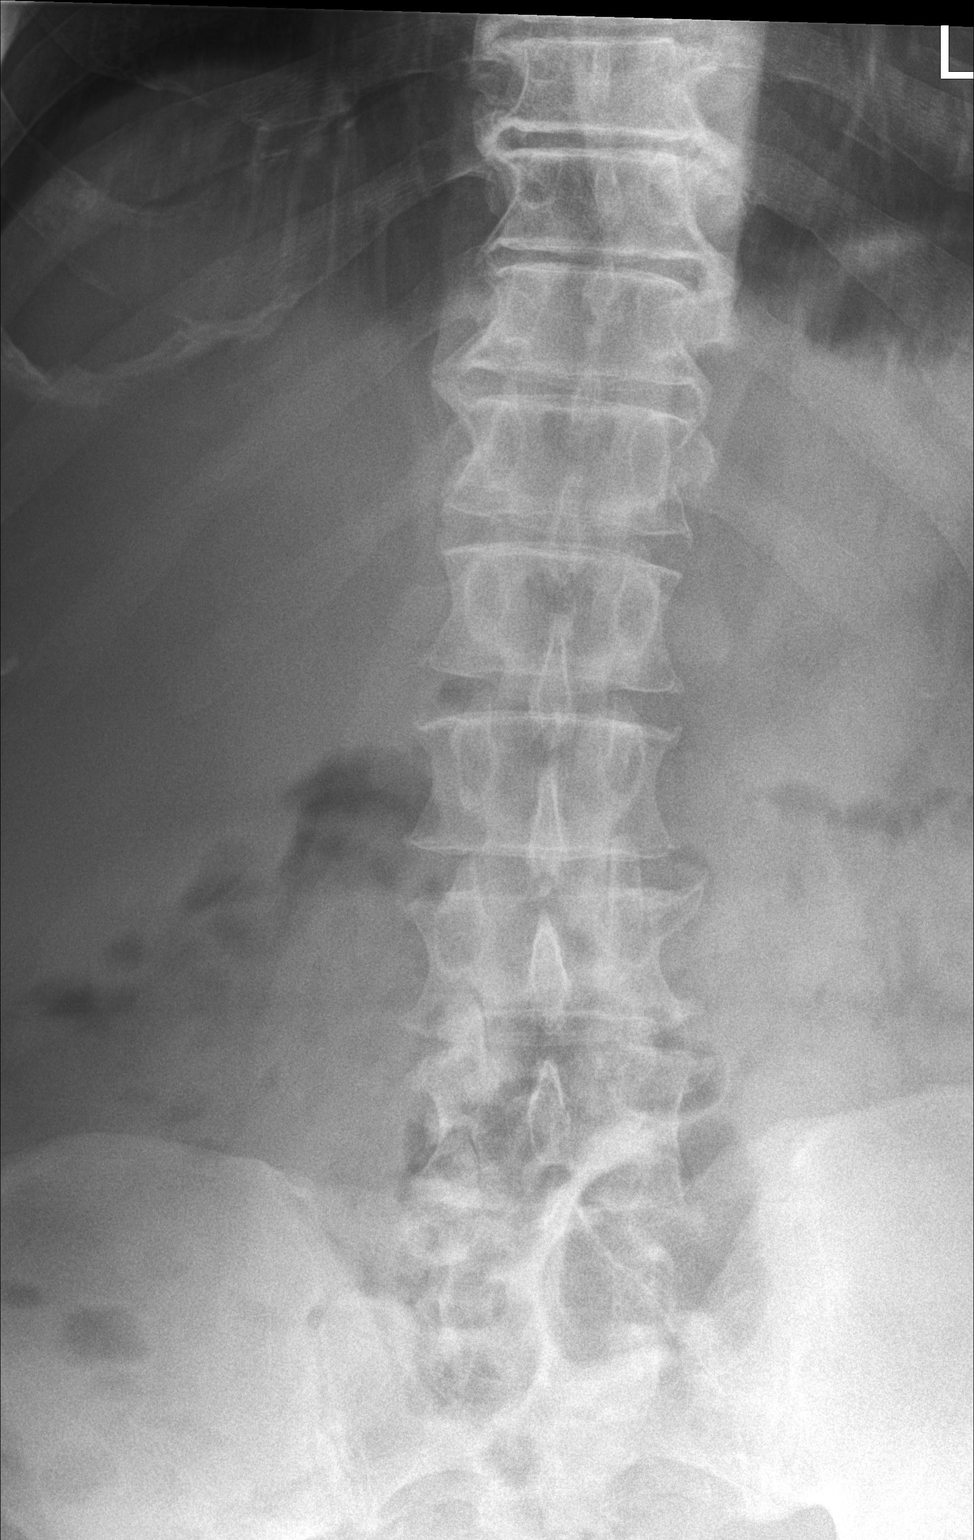

[l-spine lat (1 of 2)]
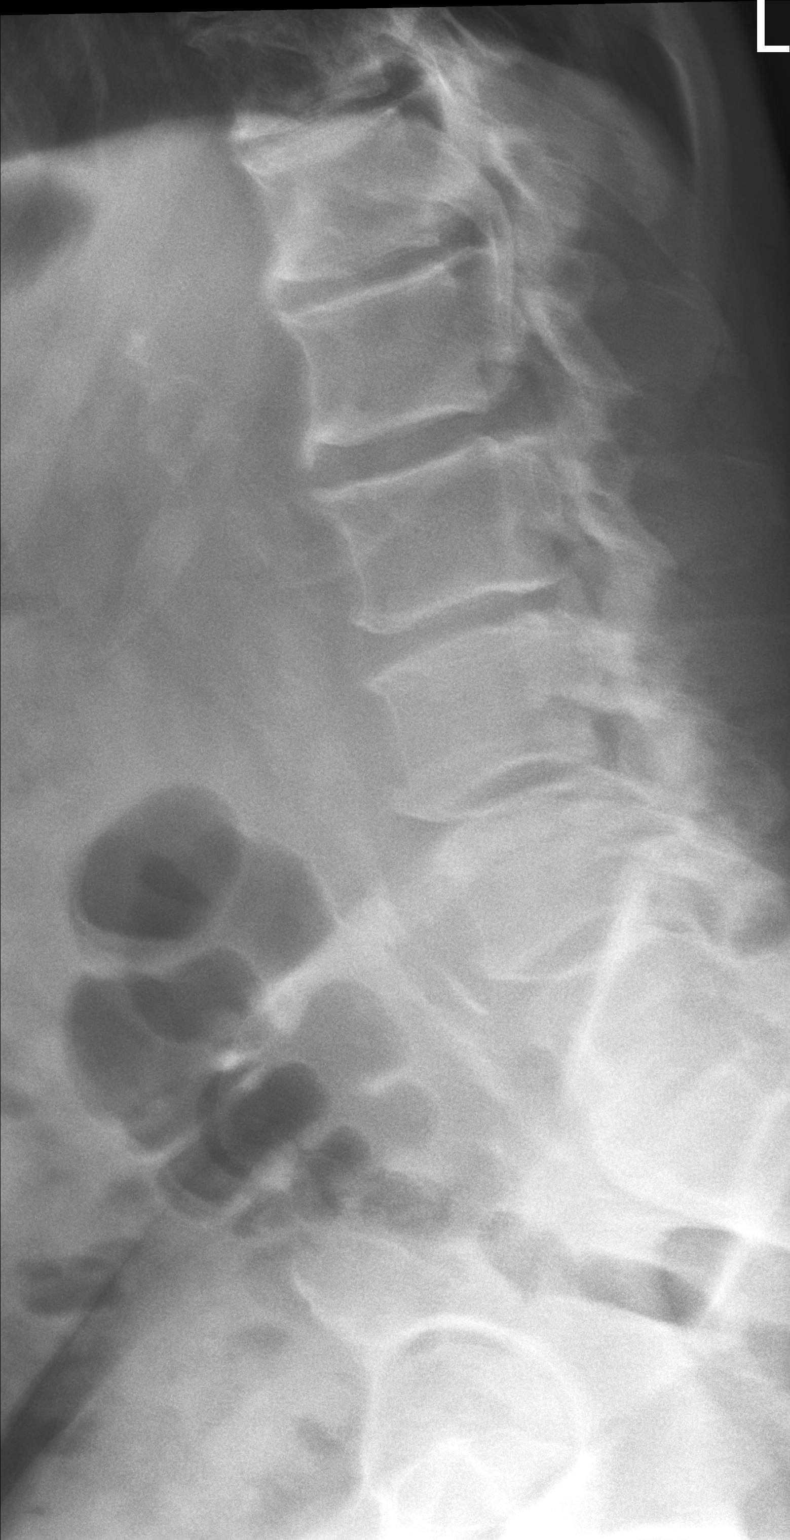

[l-spine ap (2 of 2)]
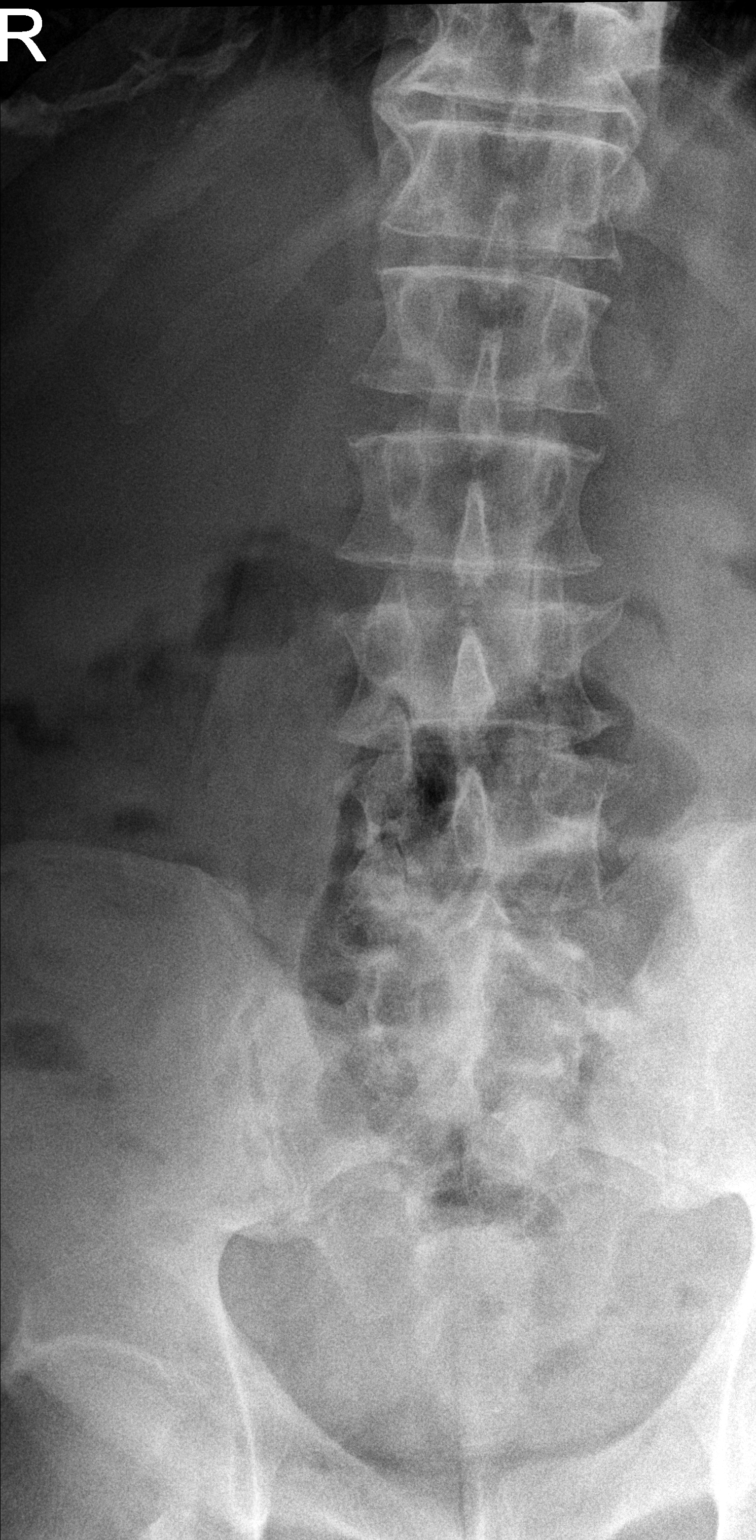

[l-spine lat (2 of 2)]
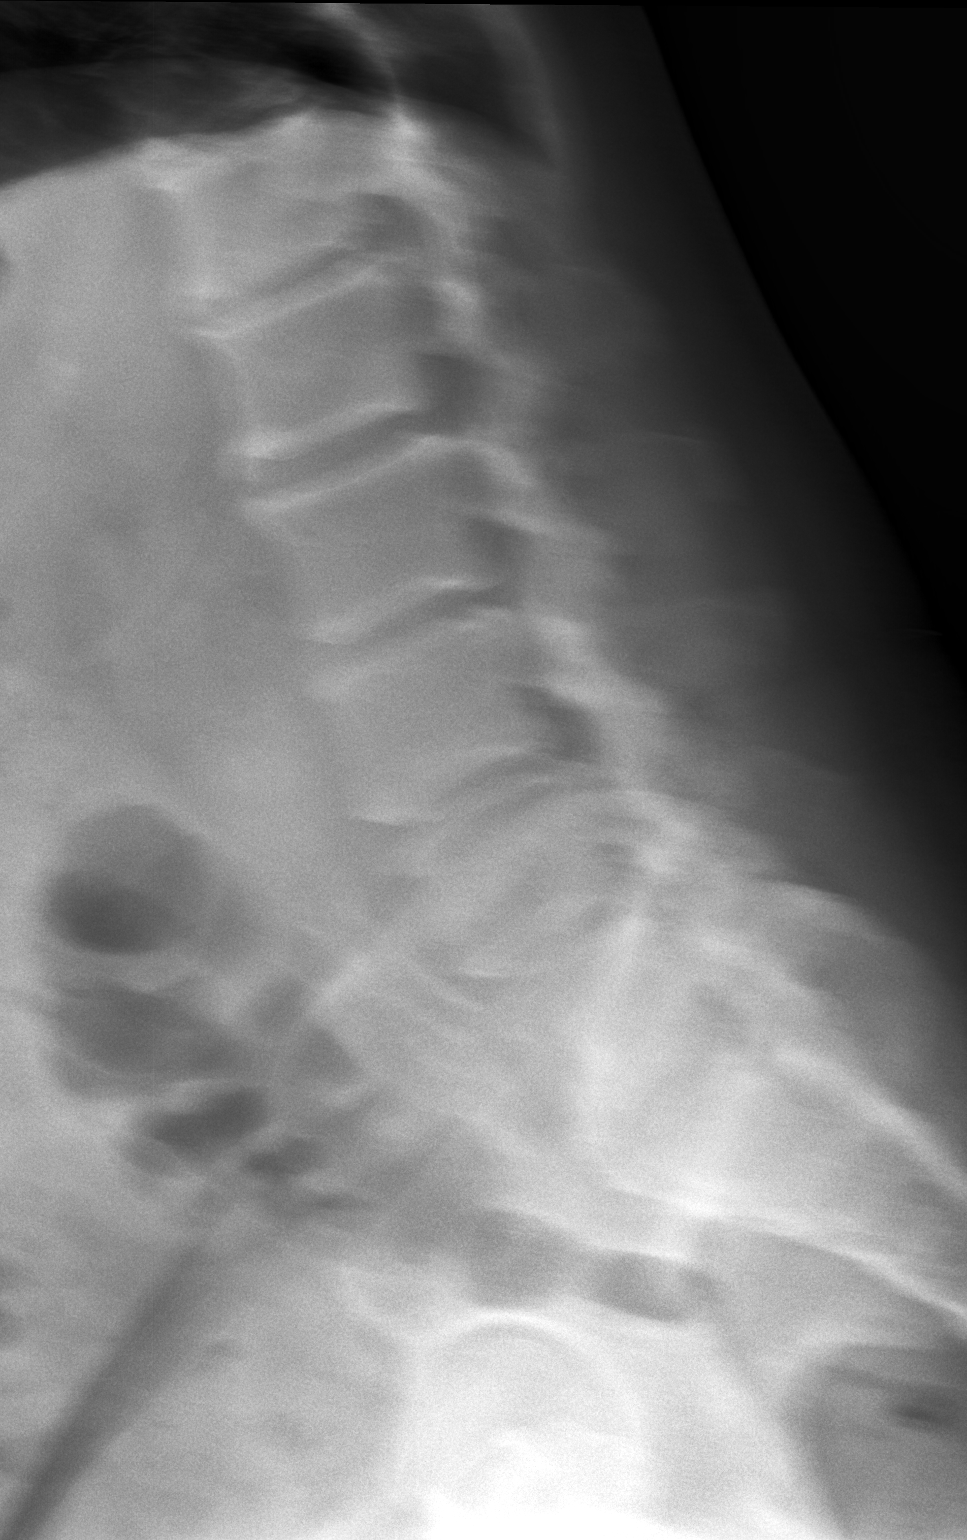

[4 of 4 positions shown; findings below may reference images not displayed]

FINDINGS: Lateral radiographs are motion degraded. There is no evidence of
lumbar spine fracture. Evidence of static listhesis. Degenerative
disc disease of L5-S1, overall similar in appearance to prior.
Abdominal aortic atherosclerotic calcification.
IMPRESSION: Degenerative disc disease L5-S1, similar in appearance to prior
study.

## 2021-08-19 ENCOUNTER — Encounter: Payer: Self-pay | Admitting: Nurse Practitioner

## 2021-08-19 ENCOUNTER — Ambulatory Visit (INDEPENDENT_AMBULATORY_CARE_PROVIDER_SITE_OTHER): Payer: Medicare Other | Admitting: Nurse Practitioner

## 2021-08-19 VITALS — BP 132/84 | HR 69 | Temp 97.5°F | Resp 20 | Ht 70.0 in | Wt 225.0 lb

## 2021-08-19 DIAGNOSIS — E876 Hypokalemia: Secondary | ICD-10-CM | POA: Diagnosis not present

## 2021-08-19 DIAGNOSIS — I259 Chronic ischemic heart disease, unspecified: Secondary | ICD-10-CM

## 2021-08-19 DIAGNOSIS — I25111 Atherosclerotic heart disease of native coronary artery with angina pectoris with documented spasm: Secondary | ICD-10-CM

## 2021-08-19 DIAGNOSIS — E1142 Type 2 diabetes mellitus with diabetic polyneuropathy: Secondary | ICD-10-CM | POA: Diagnosis not present

## 2021-08-19 DIAGNOSIS — I240 Acute coronary thrombosis not resulting in myocardial infarction: Secondary | ICD-10-CM | POA: Diagnosis not present

## 2021-08-19 DIAGNOSIS — N400 Enlarged prostate without lower urinary tract symptoms: Secondary | ICD-10-CM

## 2021-08-19 DIAGNOSIS — I2510 Atherosclerotic heart disease of native coronary artery without angina pectoris: Secondary | ICD-10-CM

## 2021-08-19 DIAGNOSIS — IMO0002 Reserved for concepts with insufficient information to code with codable children: Secondary | ICD-10-CM

## 2021-08-19 DIAGNOSIS — I10 Essential (primary) hypertension: Secondary | ICD-10-CM

## 2021-08-19 DIAGNOSIS — E7800 Pure hypercholesterolemia, unspecified: Secondary | ICD-10-CM

## 2021-08-19 DIAGNOSIS — Z125 Encounter for screening for malignant neoplasm of prostate: Secondary | ICD-10-CM

## 2021-08-19 DIAGNOSIS — I5022 Chronic systolic (congestive) heart failure: Secondary | ICD-10-CM | POA: Diagnosis not present

## 2021-08-19 DIAGNOSIS — Z794 Long term (current) use of insulin: Secondary | ICD-10-CM

## 2021-08-19 DIAGNOSIS — G8929 Other chronic pain: Secondary | ICD-10-CM

## 2021-08-19 DIAGNOSIS — M545 Low back pain, unspecified: Secondary | ICD-10-CM

## 2021-08-19 LAB — BAYER DCA HB A1C WAIVED: HB A1C (BAYER DCA - WAIVED): 5.7 % — ABNORMAL HIGH (ref 4.8–5.6)

## 2021-08-19 MED ORDER — LANTUS SOLOSTAR 100 UNIT/ML ~~LOC~~ SOPN: 60.0000 [IU] | PEN_INJECTOR | Freq: Every day | SUBCUTANEOUS | 3 refills | Status: DC

## 2021-08-19 MED ORDER — METOPROLOL TARTRATE 25 MG PO TABS: 25.0000 mg | ORAL_TABLET | Freq: Two times a day (BID) | ORAL | 1 refills | Status: DC

## 2021-08-19 MED ORDER — LISINOPRIL 20 MG PO TABS: 20.0000 mg | ORAL_TABLET | Freq: Every day | ORAL | 1 refills | Status: DC

## 2021-08-19 MED ORDER — CELECOXIB 200 MG PO CAPS: 200.0000 mg | ORAL_CAPSULE | Freq: Two times a day (BID) | ORAL | 5 refills | Status: DC

## 2021-08-19 MED ORDER — GLIMEPIRIDE 4 MG PO TABS: 4.0000 mg | ORAL_TABLET | Freq: Two times a day (BID) | ORAL | 1 refills | Status: DC

## 2021-08-19 MED ORDER — ROSUVASTATIN CALCIUM 40 MG PO TABS: 20.0000 mg | ORAL_TABLET | Freq: Every day | ORAL | 1 refills | Status: DC

## 2021-08-19 MED ORDER — TAMSULOSIN HCL 0.4 MG PO CAPS: 0.4000 mg | ORAL_CAPSULE | Freq: Every day | ORAL | 1 refills | Status: DC

## 2021-08-19 MED ORDER — JANUMET 50-1000 MG PO TABS: 1.0000 | ORAL_TABLET | Freq: Two times a day (BID) | ORAL | 1 refills | Status: DC

## 2021-08-19 MED ORDER — AMLODIPINE BESYLATE 5 MG PO TABS: 5.0000 mg | ORAL_TABLET | Freq: Every day | ORAL | 1 refills | Status: DC

## 2021-08-19 MED ORDER — OMEGA-3-ACID ETHYL ESTERS 1 G PO CAPS: 1.0000 g | ORAL_CAPSULE | Freq: Two times a day (BID) | ORAL | 3 refills | Status: DC

## 2021-08-19 NOTE — Progress Notes (Signed)
? ?Subjective:  ? ? Patient ID: Tyler Garner, male    DOB: 01-15-1956, 66 y.o.   MRN: [id number] ? ? ?Chief Complaint: medical management of chronic issues  ?  ? ?HPI: ? ?Tyler Garner is a 66 y.o. who identifies as a male who was assigned male at birth.  ? ?Social history: ?Lives with: his son lives with him ?Work history: disability ? ? ?Comes in today for follow up of the following chronic medical issues: ? ?1. Primary hypertension ?No c/o chest pain, sob or headache. Doe snot check blood pressure at home. ?BP Readings from Last 3 Encounters:  ?08/19/21 132/84  ?05/20/21 138/82  ?03/29/21 134/73  ? ? ? ?2. Pure hypercholesterolemia ?Does try  to watch diet but does no dedicated exercise. ?Lab Results  ?Component Value Date  ? CHOL 149 05/20/2021  ? HDL 23 (L) 05/20/2021  ? [place] 71 05/20/2021  ? TRIG 344 (H) 05/20/2021  ? CHOLHDL 6.5 (H) 05/20/2021  ?The ASCVD Risk score (Arnett DK, et al., 2019) failed to calculate for the following reasons: ?  The patient has a prior MI or stroke diagnosis ? ? ? ?3. Type 2 diabetes mellitus with diabetic polyneuropathy, with long-term current use of insulin ([name]) ?Fasting blood sugars are running from 100-170. He denies any low blood sugars.  ? ?4. Ischemic heart disease due to coronary artery obstruction ([place]) ?Last saw cardiology 03/17/21. According to office note his blood pressure was elevated at appointment and they added HCTZ 12.5 to meds. He had a myoperfusion study in October which was good and showed no evidence of ischemia. ? ?5. Chronic systolic CHF (congestive heart failure) ([place]) ?See above. Denies any SOB ? ?6. Hypokalemia ?No c/o muscle cramps ? ?7. Benign prostatic hyperplasia without lower urinary tract symptoms ?Had TURP done 03/28/21 and has done well since. No c/o voiding problems ? ?8. Chronic midline low back pain without sciatica ?Has chronic back pain that he just uses otc meds for. He says it is tolerable. ? ?9. Morbid obesity ([place]) ?No recent  weight changes ?Wt Readings from Last 3 Encounters:  ?08/19/21 225 lb (102.1 kg)  ?05/20/21 225 lb (102.1 kg)  ?04/16/21 220 lb (99.8 kg)  ? ?BMI Readings from Last 3 Encounters:  ?08/19/21 32.28 kg/m?  ?05/20/21 32.28 kg/m?  ?04/16/21 31.57 kg/m?  ? ? ? ? ?New complaints: ?None today ? ? ?Allergies  ?Allergen Reactions  ? Percocet [Oxycodone-Acetaminophen] Nausea And Vomiting  ? ?Outpatient Encounter Medications as of 08/19/2021  ?Medication Sig  ? Accu-Chek Softclix Lancets lancets Check blood sugar daily and prn  Dx E11.9  ? amLODipine (NORVASC) 5 MG tablet Take 1 tablet (5 mg total) by mouth daily.  ? Blood Glucose Monitoring Suppl (ACCU-CHEK GUIDE ME) w/Device KIT CHECK BLOOD SUGAR DAILY AND AS NEEDED DX E11.9  ? celecoxib (CELEBREX) 200 MG capsule Take 1 capsule (200 mg total) by mouth 2 (two) times daily.  ? docusate sodium (COLACE) 100 MG capsule Take 1 capsule (100 mg total) by mouth daily as needed for up to 30 doses.  ? glimepiride (AMARYL) 4 MG tablet Take 1 tablet (4 mg total) by mouth 2 (two) times daily.  ? glucose blood (ACCU-CHEK GUIDE) test strip Check blood sugar daily and prn  Dx E11.9  ? insulin glargine (LANTUS SOLOSTAR) 100 UNIT/ML Solostar Pen Inject 60 Units into the skin at bedtime.  ? Insulin Pen Needle (PEN NEEDLES) 31G X 8 MM MISC Use with lantus solosar pen daily  ?  JANUMET 50-1000 MG tablet Take 1 tablet by mouth 2 (two) times daily.  ? lisinopril (ZESTRIL) 20 MG tablet Take 1 tablet (20 mg total) by mouth daily.  ? metoprolol tartrate (LOPRESSOR) 25 MG tablet Take 1 tablet (25 mg total) by mouth 2 (two) times daily.  ? omega-3 acid ethyl esters (LOVAZA) 1 g capsule Take 1 capsule (1 g total) by mouth 2 (two) times daily.  ? ondansetron (ZOFRAN) 4 MG tablet Take 1 tablet (4 mg total) by mouth daily as needed for up to 30 doses for nausea or vomiting.  ? rosuvastatin (CRESTOR) 40 MG tablet Take 0.5 tablets (20 mg total) by mouth daily.  ? tamsulosin (FLOMAX) 0.4 MG CAPS capsule Take 1  capsule (0.4 mg total) by mouth daily.  ? ?No facility-administered encounter medications on file as of 08/19/2021.  ? ? ?Past Surgical History:  ?Procedure Laterality Date  ? heart stent  06/21/2013  ? x 1 to distal LCX  ? LEFT HEART CATHETERIZATION WITH CORONARY ANGIOGRAM N/A 06/21/2013  ? Procedure: LEFT HEART CATHETERIZATION WITH CORONARY ANGIOGRAM;  Surgeon: Peter M Martinique, MD;  Location[facility] CATH LAB;  Service: Cardiovascular;  Laterality: N/A;  ? LUMBAR LAMINECTOMY/DECOMPRESSION MICRODISCECTOMY N/A 11/28/2020  ? Procedure: LUMBAR DECOMPRESSION  2 LEVELS, L3 and L5;  Surgeon: Melina Schools, MD;  Location: [place];  Service: Orthopedics;  Laterality: N/A;  3.5 hrs  ? TRANSURETHRAL RESECTION OF PROSTATE N/A 03/28/2021  ? Procedure: TRANSURETHRAL RESECTION OF THE PROSTATE (TURP)/ BIPOLAR;  Surgeon: Janith Lima, MD;  Location: [facility];  Service: Urology;  Laterality: N/A;  ? ? ?Family History  ?Problem Relation Age of Onset  ? Diabetes Mother   ? ALS Mother   ? Alzheimer's disease Father   ? Healthy Sister   ? Diabetes Brother   ? ? ? ? ?Controlled substance contract: n/a ? ? ? ? ?Review of Systems ? ?   ?Objective:  ? Physical Exam ?Vitals and nursing note reviewed.  ?Constitutional:   ?   Appearance: Normal appearance. He is well-developed.  ?HENT:  ?   Head: Normocephalic.  ?   Nose: Nose normal.  ?   Mouth/Throat:  ?   Mouth: Mucous membranes are moist.  ?   Pharynx: Oropharynx is clear.  ?Eyes:  ?   Pupils: Pupils are equal, round, and reactive to light.  ?Neck:  ?   Thyroid: No thyroid mass or thyromegaly.  ?   Vascular: No carotid bruit or JVD.  ?   Trachea: Phonation normal.  ?Cardiovascular:  ?   Rate and Rhythm: Normal rate and regular rhythm.  ?Pulmonary:  ?   Effort: Pulmonary effort is normal. No respiratory distress.  ?   Breath sounds: Normal breath sounds.  ?Abdominal:  ?   General: Bowel sounds are normal.  ?   Palpations: Abdomen is soft.  ?   Tenderness: There is no abdominal  tenderness.  ?Musculoskeletal:     ?   General: Normal range of motion.  ?   Cervical back: Normal range of motion and neck supple.  ?   Right lower leg: Edema (1+) present.  ?   Left lower leg: Edema (1+) present.  ?Lymphadenopathy:  ?   Cervical: No cervical adenopathy.  ?Skin: ?   General: Skin is warm and dry.  ?Neurological:  ?   Mental Status: He is alert and oriented to person, place, and time.  ?Psychiatric:     ?   Behavior: Behavior normal.     ?  Thought Content: Thought content normal.     ?   Judgment: Judgment normal.  ? ? ?BP 132/84   Pulse 69   Temp (!) 97.5 ?F (36.4 ?C) (Temporal)   Resp 20   Ht _0  (1.778 m)   Wt 225 lb (102.1 kg)   SpO2 98%   BMI 32.28 kg/m?  ? ?Hgba1c 5.7% ? ?   ?Assessment & Plan:  ?[facility] AMISON comes in today with chief complaint of No chief complaint on file. ? ? ?Diagnosis and orders addressed: ? ?1. Primary hypertension ?Low sodium diet ?- CBC with Differential/Platelet ?- CMP14+EGFR ?- amLODipine (NORVASC) 5 MG tablet; Take 1 tablet (5 mg total) by mouth daily.  Dispense: 90 tablet; Refill: 1 ?- lisinopril (ZESTRIL) 20 MG tablet; Take 1 tablet (20 mg total) by mouth daily.  Dispense: 90 tablet; Refill: 1 ? ?2. Pure hypercholesterolemia ?Low fat diet ?- Lipid panel ?- rosuvastatin (CRESTOR) 40 MG tablet; Take 0.5 tablets (20 mg total) by mouth daily.  Dispense: 135 tablet; Refill: 1 ? ?3. Type 2 diabetes mellitus with diabetic polyneuropathy, with long-term current use of insulin ([place]) ?Continue to watch carbs in diet ?- Bayer DCA Hb A1c Waived ?- insulin glargine (LANTUS SOLOSTAR) 100 UNIT/ML Solostar Pen; Inject 60 Units into the skin at bedtime.  Dispense: 30 mL; Refill: 3 ?- glimepiride (AMARYL) 4 MG tablet; Take 1 tablet (4 mg total) by mouth 2 (two) times daily.  Dispense: 180 tablet; Refill: 1 ?- JANUMET 50-1000 MG tablet; Take 1 tablet by mouth 2 (two) times daily.  Dispense: 180 tablet; Refill: 1 ? ?4. Ischemic heart disease due to coronary artery  obstruction ([place]) ?Keep follow up with cardiology ? ?5. Chronic systolic CHF (congestive heart failure) ([place]) ?Report any sob ? ?6. Hypokalemia ?Labs pending ? ?7. Benign prostatic hyperplasia without lower urinary t

## 2021-08-19 NOTE — Patient Instructions (Signed)
Exercise Information for Aging Adults ?Staying physically active is important as you age. Physical activity and exercise can help in maintaining quality of life, health, physical function, and reducing falls. The four types of exercises that are best for older adults are endurance, strength, balance, and flexibility. Contact your health care provider before you start any exercise routine. Ask your health care provider what activities are safe for you. ?What are the risks? ?Risks associated with exercising include: ?Overdoing it. This may lead to sore muscles or fatigue. ?Falls. ?Injuries. ?Dehydration. ?How to do these exercises ?Endurance exercises ?Endurance (aerobic) exercises raise your breathing rate and heart rate. Increasing your endurance helps you do everyday tasks and stay healthy. By improving the health of your body system that includes your heart, lungs, and blood vessels (circulatory system), you may also delay or prevent diseases such as heart disease, diabetes, and weak bones (osteoporosis). Types of endurance exercises include: ?Sports. ?Indoor activities, such as using gym equipment, doing water aerobics, or dancing. ?Outdoor activities, such as biking or jogging. ?Tasks around the house, such as gardening, yard work, and heavy household chores like cleaning. ?Walking, such as hiking or walking around your neighborhood. ?When doing endurance exercises, make sure you: ?Are aware of your surroundings. ?Use safety equipment as directed. ?Dress in layers when exercising outdoors. ?Drink plenty of water to stay well hydrated. ?Build up endurance slowly. Start with 10 minutes at a time, and gradually build up to doing 30 minutes at a time. Unless your health care provider gave you different instructions, aim to exercise for a total of 150 minutes a week. Spread out that time so you are working on endurance 3 or more days a week. ?Strength exercises ?Lifting, pulling, or pushing weights helps to  strengthen muscles. Having stronger muscles makes it easier to do everyday activities, such as getting up from a chair, climbing stairs, carrying groceries, and playing with grandchildren. Strength exercises include arm and leg exercises that may be done: ?With weights. ?Without weights (using your own body weight). ?With a resistance band. ?When doing strength exercises: ?Move smoothly and steadily. Do not suddenly thrust or jerk the weights, the resistance band, or your body. ?Start with no weights or with light weights, and gradually add more weight over time. Eventually, aim to use weights that are hard or very hard for you to lift. This means that you are able to do 8 repetitions with the weight, and the last few repetitions are very challenging. ?Lift or push weights into position for 3 seconds, hold the position for 1 second, and then take 3 seconds to return to your starting position. ?Breathe out (exhale) during difficult movements, like lifting or pushing weights. Breathe in (inhale) to relax your muscles before the next repetition. ?Consider alternating arms or legs, especially when you first start strength exercises. ?Expect some slight muscle soreness after each session. ?Do strength exercises on 2 or more days a week, for 30 minutes at a time. Avoid exercising the same muscle groups two days in a row. For example, if you work on your leg muscles one day, work on your arm muscles the next day. When you can do two sets of 10-15 repetitions with a certain weight, increase the amount of weight. ?Balance exercises ?Balance exercises can help to prevent falls. Balance exercises include: ?Standing on one foot. ?Heel-to-toe walk. ?Balance walk. ?Tai chi. ?Make sure you have something sturdy to hold onto while doing balance exercises, such as a sturdy chair. As your balance   improves, challenge yourself by holding on to the chair with one hand instead of two, and then with no hands. Trying exercises with your  eyes closed also challenges your balance, but be sure to have a sturdy surface (like a countertop) close by in case you need it. ?Do balance exercises as often as you want, or as often as directed by your health care provider. ?Flexibility exercises ?Flexibility exercises improve how far you can bend, straighten, move, or rotate parts of your body (range of motion). These exercises also help you do everyday activities such as getting dressed or reaching for objects. Flexibility exercises include stretching different parts of the body, and they may be done in a standing or seated position or on the floor. ?When stretching, make sure you: ?Keep a slight bend in your arms and legs. Avoid completely straightening ("locking") your joints. ?Do not stretch so far that you feel pain. You should feel a mild stretching feeling. You may try stretching farther as you become more flexible over time. ?Relax and breathe between stretches. ?Hold on to something sturdy for balance as needed. ?Hold each stretch for 10-30 seconds. Repeat each stretch 3-5 times. ?General safety tips ?Exercise in well-lit areas. ?Do not hold your breath during exercises or stretches. ?Warm up before exercising, and cool down after exercising. This can help prevent injury. ?Drink plenty of water during exercise or any activity that makes you sweat. ?If you are not sure if an exercise is safe for you, or you are not sure how to do an exercise, talk with your health care provider. This is especially important if you have had surgery on muscles, bones, or joints (orthopedic surgery). ?Where to find more information ?You can find more information about exercise for older adults from: ?Your local health department, fitness center, or community center. These facilities may have programs for aging adults. ?[employer] on Aging: http://kim-miller.com/ ?[employer] on Aging: www.ncoa.org ?Summary ?Staying physically active is important as you age. ?Doing  endurance, strength, balance, and flexibility exercises can help in maintaining quality of life, health, physical function, and reducing falls. ?Make sure to contact your health care provider before you start any exercise routine. Ask your health care provider what activities are safe for you. ?This information is not intended to replace advice given to you by your health care provider. Make sure you discuss any questions you have with your health care provider. ?Document Revised: 09/23/2020 Document Reviewed: 09/23/2020 ?Elsevier Patient Education ? [place]. ? ?

## 2021-08-20 LAB — CBC WITH DIFFERENTIAL/PLATELET
Basophils Absolute: 0.1 10*3/uL (ref 0.0–0.2)
Basos: 1 %
EOS (ABSOLUTE): 0.3 10*3/uL (ref 0.0–0.4)
Eos: 3 %
Hematocrit: 39.1 % (ref 37.5–51.0)
Hemoglobin: 12.9 g/dL — ABNORMAL LOW (ref 13.0–17.7)
Immature Grans (Abs): 0 10*3/uL (ref 0.0–0.1)
Immature Granulocytes: 0 %
Lymphocytes Absolute: 3.7 10*3/uL — ABNORMAL HIGH (ref 0.7–3.1)
Lymphs: 40 %
MCH: 28 pg (ref 26.6–33.0)
MCHC: 33 g/dL (ref 31.5–35.7)
MCV: 85 fL (ref 79–97)
Monocytes Absolute: 0.7 10*3/uL (ref 0.1–0.9)
Monocytes: 8 %
Neutrophils Absolute: 4.4 10*3/uL (ref 1.4–7.0)
Neutrophils: 48 %
Platelets: 307 10*3/uL (ref 150–450)
RBC: 4.6 x10E6/uL (ref 4.14–5.80)
RDW: 14.2 % (ref 11.6–15.4)
WBC: 9.2 10*3/uL (ref 3.4–10.8)

## 2021-08-20 LAB — CMP14+EGFR
ALT: 10 IU/L (ref 0–44)
AST: 18 IU/L (ref 0–40)
Albumin/Globulin Ratio: 2 (ref 1.2–2.2)
Albumin: 4.7 g/dL (ref 3.8–4.8)
Alkaline Phosphatase: 53 IU/L (ref 44–121)
BUN/Creatinine Ratio: 21 (ref 10–24)
BUN: 16 mg/dL (ref 8–27)
Bilirubin Total: <0.2 mg/dL (ref 0.0–1.2)
CO2: 21 mmol/L (ref 20–29)
Calcium: 9.9 mg/dL (ref 8.6–10.2)
Chloride: 105 mmol/L (ref 96–106)
Creatinine, Ser: 0.78 mg/dL (ref 0.76–1.27)
Globulin, Total: 2.4 g/dL (ref 1.5–4.5)
Glucose: 178 mg/dL — ABNORMAL HIGH (ref 70–99)
Potassium: 5.5 mmol/L — ABNORMAL HIGH (ref 3.5–5.2)
Sodium: 141 mmol/L (ref 134–144)
Total Protein: 7.1 g/dL (ref 6.0–8.5)
eGFR: 98 mL/min/{1.73_m2} (ref >59)

## 2021-08-20 LAB — LIPID PANEL
Chol/HDL Ratio: 5.6 ratio — ABNORMAL HIGH (ref 0.0–5.0)
Cholesterol, Total: 140 mg/dL (ref 100–199)
HDL: 25 mg/dL — ABNORMAL LOW (ref >39)
LDL Chol Calc (NIH): 67 mg/dL (ref 0–99)
Triglycerides: 297 mg/dL — ABNORMAL HIGH (ref 0–149)
VLDL Cholesterol Cal: 48 mg/dL — ABNORMAL HIGH (ref 5–40)

## 2021-09-03 ENCOUNTER — Other Ambulatory Visit: Payer: Self-pay | Admitting: Nurse Practitioner

## 2021-11-21 ENCOUNTER — Encounter: Payer: Self-pay | Admitting: Nurse Practitioner

## 2021-11-21 ENCOUNTER — Ambulatory Visit (INDEPENDENT_AMBULATORY_CARE_PROVIDER_SITE_OTHER): Payer: Medicare Other | Admitting: Nurse Practitioner

## 2021-11-21 VITALS — BP 110/51 | HR 72 | Temp 97.6°F | Resp 20 | Ht 70.0 in | Wt 222.0 lb

## 2021-11-21 DIAGNOSIS — I10 Essential (primary) hypertension: Secondary | ICD-10-CM | POA: Diagnosis not present

## 2021-11-21 DIAGNOSIS — M51369 Other intervertebral disc degeneration, lumbar region without mention of lumbar back pain or lower extremity pain: Secondary | ICD-10-CM

## 2021-11-21 DIAGNOSIS — E7800 Pure hypercholesterolemia, unspecified: Secondary | ICD-10-CM

## 2021-11-21 DIAGNOSIS — Z794 Long term (current) use of insulin: Secondary | ICD-10-CM | POA: Diagnosis not present

## 2021-11-21 DIAGNOSIS — M5136 Other intervertebral disc degeneration, lumbar region: Secondary | ICD-10-CM | POA: Diagnosis not present

## 2021-11-21 DIAGNOSIS — I5022 Chronic systolic (congestive) heart failure: Secondary | ICD-10-CM | POA: Diagnosis not present

## 2021-11-21 DIAGNOSIS — IMO0002 Reserved for concepts with insufficient information to code with codable children: Secondary | ICD-10-CM

## 2021-11-21 DIAGNOSIS — E876 Hypokalemia: Secondary | ICD-10-CM | POA: Diagnosis not present

## 2021-11-21 DIAGNOSIS — E1142 Type 2 diabetes mellitus with diabetic polyneuropathy: Secondary | ICD-10-CM | POA: Diagnosis not present

## 2021-11-21 DIAGNOSIS — I25111 Atherosclerotic heart disease of native coronary artery with angina pectoris with documented spasm: Secondary | ICD-10-CM | POA: Diagnosis not present

## 2021-11-21 DIAGNOSIS — I240 Acute coronary thrombosis not resulting in myocardial infarction: Secondary | ICD-10-CM

## 2021-11-21 DIAGNOSIS — N400 Enlarged prostate without lower urinary tract symptoms: Secondary | ICD-10-CM

## 2021-11-21 DIAGNOSIS — I259 Chronic ischemic heart disease, unspecified: Secondary | ICD-10-CM

## 2021-11-21 LAB — BAYER DCA HB A1C WAIVED: HB A1C (BAYER DCA - WAIVED): 7 % — ABNORMAL HIGH (ref 4.8–5.6)

## 2021-11-21 NOTE — Progress Notes (Signed)
Subjective:    Patient ID: Tyler Garner, male    DOB: 08/01/1955, 66 y.o.   MRN: [id number]   Chief Complaint: medical management of chronic issues     HPI:  Tyler Garner is a 66 y.o. who identifies as a male who was assigned male at birth.   Social history: Lives with: his son lives with him Work history: retired   Scientist, forensic in today for follow up of the following chronic medical issues:  1. Primary hypertension No c/o chest pain, sob or headache. Does not check blood pressure at home. BP Readings from Last 3 Encounters:  08/19/21 132/84  05/20/21 138/82  03/29/21 134/73     2. Pure hypercholesterolemia Does try to watch diet, but does no dedicated exercise. Lab Results  Component Value Date   CHOL 140 08/19/2021   HDL 25 (L) 08/19/2021   LDLCALC 67 08/19/2021   TRIG 297 (H) 08/19/2021   CHOLHDL 5.6 (H) 08/19/2021     3. Type 2 diabetes mellitus with diabetic polyneuropathy, with long-term current use of insulin (HCC) Fasting blood sugars are running 9-130. No low blood sugars. Lab Results  Component Value Date   HGBA1C 5.7 (H) 08/19/2021     4. Atherosclerosis of native coronary artery of native heart with angina pectoris with documented spasm ([place]) 5. Ischemic heart disease due to coronary artery obstruction ([place]) 6. Chronic systolic CHF (congestive heart failure) ([place]) Last saw cardiology 03/17/21. According to office note, no changes made to plan of care. He had cardio perfusion study showed no ischemia with EF of 41%.  7. Hypokalemia No c/o lower ext cramping Lab Results  Component Value Date   K 5.5 (H) 08/19/2021     8. DDD (degenerative disc disease), lumbar Has chronic back pain which is stable. He takes motrin or tylenol and it is tolerable  9. Benign prostatic hyperplasia without lower urinary tract symptoms No voiding issues  10. Morbid obesity ([place]) No recent weight changes Wt Readings from Last 3 Encounters:  11/21/21 222 lb  (100.7 kg)  08/19/21 225 lb (102.1 kg)  05/20/21 225 lb (102.1 kg)   BMI Readings from Last 3 Encounters:  11/21/21 31.85 kg/m  08/19/21 32.28 kg/m  05/20/21 32.28 kg/m     New complaints: None today  Allergies  Allergen Reactions   Percocet [Oxycodone-Acetaminophen] Nausea And Vomiting   Outpatient Encounter Medications as of 11/21/2021  Medication Sig   Accu-Chek Softclix Lancets lancets Check blood sugar daily and prn  Dx E11.9   amLODipine (NORVASC) 5 MG tablet Take 1 tablet (5 mg total) by mouth daily.   Blood Glucose Monitoring Suppl (ACCU-CHEK GUIDE ME) w/Device KIT CHECK BLOOD SUGAR DAILY AND AS NEEDED DX E11.9   celecoxib (CELEBREX) 200 MG capsule Take 1 capsule (200 mg total) by mouth 2 (two) times daily.   docusate sodium (COLACE) 100 MG capsule Take 1 capsule (100 mg total) by mouth daily as needed for up to 30 doses.   glimepiride (AMARYL) 4 MG tablet Take 1 tablet (4 mg total) by mouth 2 (two) times daily.   glucose blood (ACCU-CHEK GUIDE) test strip CHECK BLOOD SUGAR DAILY AND AS NEEDED DX E11.9   insulin glargine (LANTUS SOLOSTAR) 100 UNIT/ML Solostar Pen Inject 60 Units into the skin at bedtime.   Insulin Pen Needle (PEN NEEDLES) 31G X 8 MM MISC Use with lantus solosar pen daily   JANUMET 50-1000 MG tablet Take 1 tablet by mouth 2 (two) times daily.  lisinopril (ZESTRIL) 20 MG tablet Take 1 tablet (20 mg total) by mouth daily.   metoprolol tartrate (LOPRESSOR) 25 MG tablet Take 1 tablet (25 mg total) by mouth 2 (two) times daily.   omega-3 acid ethyl esters (LOVAZA) 1 g capsule Take 1 capsule (1 g total) by mouth 2 (two) times daily.   ondansetron (ZOFRAN) 4 MG tablet Take 1 tablet (4 mg total) by mouth daily as needed for up to 30 doses for nausea or vomiting.   rosuvastatin (CRESTOR) 40 MG tablet Take 0.5 tablets (20 mg total) by mouth daily.   tamsulosin (FLOMAX) 0.4 MG CAPS capsule Take 1 capsule (0.4 mg total) by mouth daily.   No facility-administered  encounter medications on file as of 11/21/2021.    Past Surgical History:  Procedure Laterality Date   heart stent  06/21/2013   x 1 to distal LCX   LEFT HEART CATHETERIZATION WITH CORONARY ANGIOGRAM N/A 06/21/2013   Procedure: LEFT HEART CATHETERIZATION WITH CORONARY ANGIOGRAM;  Surgeon: Peter M Martinique, MD;  Location[facility] CATH LAB;  Service: Cardiovascular;  Laterality: N/A;   LUMBAR LAMINECTOMY/DECOMPRESSION MICRODISCECTOMY N/A 11/28/2020   Procedure: LUMBAR DECOMPRESSION  2 LEVELS, L3 and L5;  Surgeon: Melina Schools, MD;  Location: [facility];  Service: Orthopedics;  Laterality: N/A;  3.5 hrs   TRANSURETHRAL RESECTION OF PROSTATE N/A 03/28/2021   Procedure: TRANSURETHRAL RESECTION OF THE PROSTATE (TURP)/ BIPOLAR;  Surgeon: Janith Lima, MD;  Location: [facility];  Service: Urology;  Laterality: N/A;    Family History  Problem Relation Age of Onset   Diabetes Mother    ALS Mother    Alzheimer's disease Father    Healthy Sister    Diabetes Brother       Controlled substance contract: n/a     Review of Systems  Constitutional:  Negative for diaphoresis.  Eyes:  Negative for pain.  Respiratory:  Negative for shortness of breath.   Cardiovascular:  Negative for chest pain, palpitations and leg swelling.  Gastrointestinal:  Negative for abdominal pain.  Endocrine: Negative for polydipsia.  Skin:  Negative for rash.  Neurological:  Negative for dizziness, weakness and headaches.  Hematological:  Does not bruise/bleed easily.  All other systems reviewed and are negative.      Objective:   Physical Exam Vitals and nursing note reviewed.  Constitutional:      Appearance: Normal appearance. He is well-developed.  HENT:     Head: Normocephalic.     Nose: Nose normal.     Mouth/Throat:     Mouth: Mucous membranes are moist.     Pharynx: Oropharynx is clear.  Eyes:     Pupils: Pupils are equal, round, and reactive to light.  Neck:     Thyroid: No thyroid  mass or thyromegaly.     Vascular: No carotid bruit or JVD.     Trachea: Phonation normal.  Cardiovascular:     Rate and Rhythm: Normal rate and regular rhythm.  Pulmonary:     Effort: Pulmonary effort is normal. No respiratory distress.     Breath sounds: Normal breath sounds.  Abdominal:     General: Bowel sounds are normal.     Palpations: Abdomen is soft.     Tenderness: There is no abdominal tenderness.  Musculoskeletal:        General: Normal range of motion.     Cervical back: Normal range of motion and neck supple.     Comments: Gait slow and steady and is  walking with a cane  Lymphadenopathy:     Cervical: No cervical adenopathy.  Skin:    General: Skin is warm and dry.  Neurological:     Mental Status: He is alert and oriented to person, place, and time.  Psychiatric:        Behavior: Behavior normal.        Thought Content: Thought content normal.        Judgment: Judgment normal.    BP (!) 110/51   Pulse 72   Temp 97.6 F (36.4 C) (Temporal)   Resp 20   Ht _0  (1.778 m)   Wt 222 lb (100.7 kg)   SpO2 98%   BMI 31.85 kg/m   HGBA1c 7.0%      Assessment & Plan:   Tyler Garner comes in today with chief complaint of Medical Management of Chronic Issues   Diagnosis and orders addressed:  1. Primary hypertension Low sodium diet - CBC with Differential/Platelet - CMP14+EGFR  2. Pure hypercholesterolemia Low fat diet - Lipid panel  3. Type 2 diabetes mellitus with diabetic polyneuropathy, with long-term current use of insulin (HCC) Continue to watch carbs in diet - Bayer DCA Hb A1c Waived - Microalbumin / creatinine urine ratio  4. Atherosclerosis of native coronary artery of native heart with angina pectoris with documented spasm ([place]) 5. Ischemic heart disease due to coronary artery obstruction ([place]) 6. Chronic systolic CHF (congestive heart failure) ([place]) Keep follow up with cardiology  7. Hypokalemia Labs pending  8. DDD  (degenerative disc disease), lumbar Moist heat Rest Back stretches  9. Benign prostatic hyperplasia without lower urinary tract symptoms Report any voiding issues  10. Morbid obesity ([place]) Discussed diet and exercise for person with BMI >25 Will recheck weight in 3-6 months    Labs pending Health Maintenance reviewed Diet and exercise encouraged  Follow up plan: 3 months   [name] Hassell Done, [name]

## 2021-11-21 NOTE — Patient Instructions (Signed)
Acute Back Pain, Adult Acute back pain is sudden and usually short-lived. It is often caused by an injury to the muscles and tissues in the back. The injury may result from: A muscle, tendon, or ligament getting overstretched or torn. Ligaments are tissues that connect bones to each other. Lifting something improperly can cause a back strain. Wear and tear (degeneration) of the spinal disks. Spinal disks are circular tissue that provide cushioning between the bones of the spine (vertebrae). Twisting motions, such as while playing sports or doing yard work. A hit to the back. Arthritis. You may have a physical exam, lab tests, and imaging tests to find the cause of your pain. Acute back pain usually goes away with rest and home care. Follow these instructions at home: Managing pain, stiffness, and swelling Take over-the-counter and prescription medicines only as told by your health care provider. Treatment may include medicines for pain and inflammation that are taken by mouth or applied to the skin, or muscle relaxants. Your health care provider may recommend applying ice during the first 24-48 hours after your pain starts. To do this: Put ice in a plastic bag. Place a towel between your skin and the bag. Leave the ice on for 20 minutes, 2-3 times a day. Remove the ice if your skin turns bright red. This is very important. If you cannot feel pain, heat, or cold, you have a greater risk of damage to the area. If directed, apply heat to the affected area as often as told by your health care provider. Use the heat source that your health care provider recommends, such as a moist heat pack or a heating pad. Place a towel between your skin and the heat source. Leave the heat on for 20-30 minutes. Remove the heat if your skin turns bright red. This is especially important if you are unable to feel pain, heat, or cold. You have a greater risk of getting burned. Activity  Do not stay in bed. Staying in  bed for more than 1-2 days can delay your recovery. Sit up and stand up straight. Avoid leaning forward when you sit or hunching over when you stand. If you work at a desk, sit close to it so you do not need to lean over. Keep your chin tucked in. Keep your neck drawn back, and keep your elbows bent at a 90-degree angle (right angle). Sit high and close to the steering wheel when you drive. Add lower back (lumbar) support to your car seat, if needed. Take short walks on even surfaces as soon as you are able. Try to increase the length of time you walk each day. Do not sit, drive, or stand in one place for more than 30 minutes at a time. Sitting or standing for long periods of time can put stress on your back. Do not drive or use heavy machinery while taking prescription pain medicine. Use proper lifting techniques. When you bend and lift, use positions that put less stress on your back: Bend your knees. Keep the load close to your body. Avoid twisting. Exercise regularly as told by your health care provider. Exercising helps your back heal faster and helps prevent back injuries by keeping muscles strong and flexible. Work with a physical therapist to make a safe exercise program, as recommended by your health care provider. Do any exercises as told by your physical therapist. Lifestyle Maintain a healthy weight. Extra weight puts stress on your back and makes it difficult to have good   posture. Avoid activities or situations that make you feel anxious or stressed. Stress and anxiety increase muscle tension and can make back pain worse. Learn ways to manage anxiety and stress, such as through exercise. General instructions Sleep on a firm mattress in a comfortable position. Try lying on your side with your knees slightly bent. If you lie on your back, put a pillow under your knees. Keep your head and neck in a straight line with your spine (neutral position) when using electronic equipment like  smartphones or pads. To do this: Raise your smartphone or pad to look at it instead of bending your head or neck to look down. Put the smartphone or pad at the level of your face while looking at the screen. Follow your treatment plan as told by your health care provider. This may include: Cognitive or behavioral therapy. Acupuncture or massage therapy. Meditation or yoga. Contact a health care provider if: You have pain that is not relieved with rest or medicine. You have increasing pain going down into your legs or buttocks. Your pain does not improve after 2 weeks. You have pain at night. You lose weight without trying. You have a fever or chills. You develop nausea or vomiting. You develop abdominal pain. Get help right away if: You develop new bowel or bladder control problems. You have unusual weakness or numbness in your arms or legs. You feel faint. These symptoms may represent a serious problem that is an emergency. Do not wait to see if the symptoms will go away. Get medical help right away. Call your local emergency services (911 in the [place] Do not drive yourself to the hospital. Summary Acute back pain is sudden and usually short-lived. Use proper lifting techniques. When you bend and lift, use positions that put less stress on your back. Take over-the-counter and prescription medicines only as told by your health care provider, and apply heat or ice as told. This information is not intended to replace advice given to you by your health care provider. Make sure you discuss any questions you have with your health care provider. Document Revised: 08/02/2020 Document Reviewed: 08/02/2020 Elsevier Patient Education  2023 Elsevier Inc.  

## 2021-11-22 LAB — CBC WITH DIFFERENTIAL/PLATELET
Basophils Absolute: 0.1 10*3/uL (ref 0.0–0.2)
Basos: 1 %
EOS (ABSOLUTE): 0.2 10*3/uL (ref 0.0–0.4)
Eos: 2 %
Hematocrit: 37.4 % — ABNORMAL LOW (ref 37.5–51.0)
Hemoglobin: 11.9 g/dL — ABNORMAL LOW (ref 13.0–17.7)
Immature Grans (Abs): 0 10*3/uL (ref 0.0–0.1)
Immature Granulocytes: 0 %
Lymphocytes Absolute: 3.4 10*3/uL — ABNORMAL HIGH (ref 0.7–3.1)
Lymphs: 29 %
MCH: 26 pg — ABNORMAL LOW (ref 26.6–33.0)
MCHC: 31.8 g/dL (ref 31.5–35.7)
MCV: 82 fL (ref 79–97)
Monocytes Absolute: 0.9 10*3/uL (ref 0.1–0.9)
Monocytes: 8 %
Neutrophils Absolute: 6.8 10*3/uL (ref 1.4–7.0)
Neutrophils: 60 %
Platelets: 271 10*3/uL (ref 150–450)
RBC: 4.58 x10E6/uL (ref 4.14–5.80)
RDW: 13.7 % (ref 11.6–15.4)
WBC: 11.5 10*3/uL — ABNORMAL HIGH (ref 3.4–10.8)

## 2021-11-22 LAB — CMP14+EGFR
ALT: 8 IU/L (ref 0–44)
AST: 13 IU/L (ref 0–40)
Albumin/Globulin Ratio: 2 (ref 1.2–2.2)
Albumin: 4.8 g/dL (ref 3.8–4.8)
Alkaline Phosphatase: 56 IU/L (ref 44–121)
BUN/Creatinine Ratio: 17 (ref 10–24)
BUN: 17 mg/dL (ref 8–27)
Bilirubin Total: 0.2 mg/dL (ref 0.0–1.2)
CO2: 21 mmol/L (ref 20–29)
Calcium: 9.6 mg/dL (ref 8.6–10.2)
Chloride: 104 mmol/L (ref 96–106)
Creatinine, Ser: 1 mg/dL (ref 0.76–1.27)
Globulin, Total: 2.4 g/dL (ref 1.5–4.5)
Glucose: 154 mg/dL — ABNORMAL HIGH (ref 70–99)
Potassium: 4.5 mmol/L (ref 3.5–5.2)
Sodium: 141 mmol/L (ref 134–144)
Total Protein: 7.2 g/dL (ref 6.0–8.5)
eGFR: 82 mL/min/{1.73_m2} (ref >59)

## 2021-11-22 LAB — LIPID PANEL
Chol/HDL Ratio: 5.3 ratio — ABNORMAL HIGH (ref 0.0–5.0)
Cholesterol, Total: 122 mg/dL (ref 100–199)
HDL: 23 mg/dL — ABNORMAL LOW (ref >39)
LDL Chol Calc (NIH): 61 mg/dL (ref 0–99)
Triglycerides: 233 mg/dL — ABNORMAL HIGH (ref 0–149)
VLDL Cholesterol Cal: 38 mg/dL (ref 5–40)

## 2021-11-22 LAB — MICROALBUMIN / CREATININE URINE RATIO
Creatinine, Urine: 210.8 mg/dL
Microalb/Creat Ratio: 42 mg/g creat — ABNORMAL HIGH (ref 0–29)
Microalbumin, Urine: 88.6 ug/mL

## 2021-11-25 ENCOUNTER — Other Ambulatory Visit: Payer: Self-pay | Admitting: Nurse Practitioner

## 2021-11-25 DIAGNOSIS — M545 Low back pain, unspecified: Secondary | ICD-10-CM

## 2021-12-31 ENCOUNTER — Other Ambulatory Visit: Payer: Self-pay | Admitting: *Deleted

## 2021-12-31 NOTE — Patient Outreach (Signed)
  Care Coordination   12/31/2021 Name: Tyler Garner MRN: [id number] DOB: 1956-03-11   Care Coordination Outreach Attempts:  An unsuccessful telephone outreach was attempted today to offer the patient information about available care coordination services as a benefit of their health plan.   Follow Up Plan:  Additional outreach attempts will be made to offer the patient care coordination information and services.   Encounter Outcome:  No Answer  Care Coordination Interventions Activated:  Yes   Care Coordination Interventions:  No, not indicated    SIG Gean Maidens BSN RN [facility] [phone]

## 2022-02-12 ENCOUNTER — Other Ambulatory Visit: Payer: Self-pay | Admitting: *Deleted

## 2022-02-12 NOTE — Patient Outreach (Signed)
  Care Coordination   02/12/2022  Name: ZAFAR DEBROSSE MRN: [id number] DOB: 05/05/1956   Care Coordination Outreach Attempts:  An unsuccessful telephone outreach was attempted today to offer the patient information about available care coordination services as a benefit of their health plan. HIPAA compliant messages left on voicemail, providing contact information for CSW, encouraging patient to return CSW's call at his earliest convenience.  Follow Up Plan:  Additional outreach attempts will be made to offer the patient care coordination information and services.   Encounter Outcome:  No Answer.   Care Coordination Interventions Activated:  No.    Care Coordination Interventions:  No, not indicated.    Nat Christen, BSW, MSW, LCSW  Licensed Education officer, [facility]  Mailing [employer] [address], [place], [place] [address] Physical Address-[address], [place], [place] [address] Toll Free Main # [phone] Fax # [phone] Cell # [phone] [email or web address]

## 2022-02-17 ENCOUNTER — Ambulatory Visit (INDEPENDENT_AMBULATORY_CARE_PROVIDER_SITE_OTHER): Payer: Medicare Other | Admitting: Nurse Practitioner

## 2022-02-17 ENCOUNTER — Encounter: Payer: Self-pay | Admitting: Nurse Practitioner

## 2022-02-17 VITALS — BP 142/66 | HR 75 | Temp 97.6°F | Ht 70.0 in | Wt 224.5 lb

## 2022-02-17 DIAGNOSIS — M5136 Other intervertebral disc degeneration, lumbar region: Secondary | ICD-10-CM | POA: Diagnosis not present

## 2022-02-17 DIAGNOSIS — E1142 Type 2 diabetes mellitus with diabetic polyneuropathy: Secondary | ICD-10-CM | POA: Diagnosis not present

## 2022-02-17 DIAGNOSIS — I259 Chronic ischemic heart disease, unspecified: Secondary | ICD-10-CM

## 2022-02-17 DIAGNOSIS — Z794 Long term (current) use of insulin: Secondary | ICD-10-CM

## 2022-02-17 DIAGNOSIS — I240 Acute coronary thrombosis not resulting in myocardial infarction: Secondary | ICD-10-CM | POA: Diagnosis not present

## 2022-02-17 DIAGNOSIS — I5022 Chronic systolic (congestive) heart failure: Secondary | ICD-10-CM

## 2022-02-17 DIAGNOSIS — M545 Low back pain, unspecified: Secondary | ICD-10-CM

## 2022-02-17 DIAGNOSIS — I25111 Atherosclerotic heart disease of native coronary artery with angina pectoris with documented spasm: Secondary | ICD-10-CM | POA: Diagnosis not present

## 2022-02-17 DIAGNOSIS — E1165 Type 2 diabetes mellitus with hyperglycemia: Secondary | ICD-10-CM

## 2022-02-17 DIAGNOSIS — E876 Hypokalemia: Secondary | ICD-10-CM

## 2022-02-17 DIAGNOSIS — Z23 Encounter for immunization: Secondary | ICD-10-CM

## 2022-02-17 DIAGNOSIS — N400 Enlarged prostate without lower urinary tract symptoms: Secondary | ICD-10-CM

## 2022-02-17 DIAGNOSIS — I10 Essential (primary) hypertension: Secondary | ICD-10-CM

## 2022-02-17 DIAGNOSIS — I2510 Atherosclerotic heart disease of native coronary artery without angina pectoris: Secondary | ICD-10-CM

## 2022-02-17 DIAGNOSIS — E7800 Pure hypercholesterolemia, unspecified: Secondary | ICD-10-CM

## 2022-02-17 DIAGNOSIS — G8929 Other chronic pain: Secondary | ICD-10-CM

## 2022-02-17 LAB — BAYER DCA HB A1C WAIVED: HB A1C (BAYER DCA - WAIVED): 6.9 % — ABNORMAL HIGH (ref 4.8–5.6)

## 2022-02-17 MED ORDER — LANTUS SOLOSTAR 100 UNIT/ML ~~LOC~~ SOPN: 60.0000 [IU] | PEN_INJECTOR | Freq: Every day | SUBCUTANEOUS | 3 refills | Status: DC

## 2022-02-17 MED ORDER — GLIMEPIRIDE 4 MG PO TABS: 4.0000 mg | ORAL_TABLET | Freq: Two times a day (BID) | ORAL | 1 refills | Status: DC

## 2022-02-17 MED ORDER — PEN NEEDLES 31G X 8 MM MISC: 2 refills | Status: DC

## 2022-02-17 MED ORDER — AMLODIPINE BESYLATE 5 MG PO TABS: 5.0000 mg | ORAL_TABLET | Freq: Every day | ORAL | 1 refills | Status: DC

## 2022-02-17 MED ORDER — JANUMET 50-1000 MG PO TABS: 1.0000 | ORAL_TABLET | Freq: Two times a day (BID) | ORAL | 1 refills | Status: DC

## 2022-02-17 MED ORDER — METOPROLOL TARTRATE 25 MG PO TABS: 25.0000 mg | ORAL_TABLET | Freq: Two times a day (BID) | ORAL | 1 refills | Status: DC

## 2022-02-17 MED ORDER — LISINOPRIL 20 MG PO TABS: 20.0000 mg | ORAL_TABLET | Freq: Every day | ORAL | 1 refills | Status: DC

## 2022-02-17 MED ORDER — ROSUVASTATIN CALCIUM 40 MG PO TABS: 20.0000 mg | ORAL_TABLET | Freq: Every day | ORAL | 1 refills | Status: DC

## 2022-02-17 MED ORDER — CELECOXIB 200 MG PO CAPS: 200.0000 mg | ORAL_CAPSULE | Freq: Two times a day (BID) | ORAL | 2 refills | Status: DC

## 2022-02-17 MED ORDER — OMEGA-3-ACID ETHYL ESTERS 1 G PO CAPS: 1.0000 g | ORAL_CAPSULE | Freq: Two times a day (BID) | ORAL | 3 refills | Status: DC

## 2022-02-17 NOTE — Progress Notes (Signed)
Subjective:    Patient ID: Tyler Garner, male    DOB: 1955/10/27, 66 y.o.   MRN: [id number]   Chief Complaint: medical management of chronic issues   HPI:  Tyler Garner is a 66 y.o. who identifies as a male who was assigned male at birth.   Social history: Lives with: son lives with him Work history: retired   Scientist, forensic in today for follow up of the following chronic medical issues:  1. Primary hypertension No c/o chest pain, sob or headache. Does not check BP at home. BP Readings from Last 3 Encounters:  02/17/22 (!) 142/66  11/21/21 (!) 110/51  08/19/21 132/84      2. Pure hypercholesterolemia Does try to watch his diet, but no dedicated exercise. Lab Results  Component Value Date   CHOL 122 11/21/2021   HDL 23 (L) 11/21/2021   LDLCALC 61 11/21/2021   TRIG 233 (H) 11/21/2021   CHOLHDL 5.3 (H) 11/21/2021     3. Type 2 diabetes mellitus with diabetic polyneuropathy, with long-term current use of insulin ([place]) Does try to watch diet. Fasting blood sugars running between 95-140. No low blood sugars. Lab Results  Component Value Date   HGBA1C 7.0 (H) 11/21/2021      4. Atherosclerosis of native coronary artery of native heart with angina pectoris with documented spasm ([place]) 5. Ischemic heart disease due to coronary artery obstruction ([place]) 6. Chronic systolic CHF (congestive heart failure) ([place]) Last visit with cardiology on 03/17/21; no changes made to plan per note. Cardio perfusion study showed no ischemia; EF 41%.  7. Hypokalemia No c/o leg cramps  Lab Results  Component Value Date   K 4.5 11/21/2021     8. DDD (degenerative disc disease), lumbar Chronic back pain stable; tolerable with motrin or tylenol.  9. Benign prostatic hyperplasia without lower urinary tract symptoms Has to void every 2-3 hours.  10. Morbid obesity ([place]) Weight is up 2 lbs since last check. Wt Readings from Last 3 Encounters:  02/17/22 224 lb 8 oz (101.8 kg)   11/21/21 222 lb (100.7 kg)  08/19/21 225 lb (102.1 kg)     BMI Readings from Last 3 Encounters:  02/17/22 32.21 kg/m  11/21/21 31.85 kg/m  08/19/21 32.28 kg/m      New complaints: None today.  Allergies  Allergen Reactions   Percocet [Oxycodone-Acetaminophen] Nausea And Vomiting   Outpatient Encounter Medications as of 02/17/2022  Medication Sig   Accu-Chek Softclix Lancets lancets Check blood sugar daily and prn  Dx E11.9   amLODipine (NORVASC) 5 MG tablet Take 1 tablet (5 mg total) by mouth daily.   Blood Glucose Monitoring Suppl (ACCU-CHEK GUIDE ME) w/Device KIT CHECK BLOOD SUGAR DAILY AND AS NEEDED DX E11.9   celecoxib (CELEBREX) 200 MG capsule TAKE 1 CAPSULE BY MOUTH TWICE A DAY   glimepiride (AMARYL) 4 MG tablet Take 1 tablet (4 mg total) by mouth 2 (two) times daily.   glucose blood (ACCU-CHEK GUIDE) test strip CHECK BLOOD SUGAR DAILY AND AS NEEDED DX E11.9   insulin glargine (LANTUS SOLOSTAR) 100 UNIT/ML Solostar Pen Inject 60 Units into the skin at bedtime.   Insulin Pen Needle (PEN NEEDLES) 31G X 8 MM MISC Use with lantus solosar pen daily   JANUMET 50-1000 MG tablet Take 1 tablet by mouth 2 (two) times daily.   lisinopril (ZESTRIL) 20 MG tablet Take 1 tablet (20 mg total) by mouth daily.   metoprolol tartrate (LOPRESSOR) 25 MG tablet Take 1 tablet (  25 mg total) by mouth 2 (two) times daily.   omega-3 acid ethyl esters (LOVAZA) 1 g capsule Take 1 capsule (1 g total) by mouth 2 (two) times daily.   ondansetron (ZOFRAN) 4 MG tablet Take 1 tablet (4 mg total) by mouth daily as needed for up to 30 doses for nausea or vomiting.   rosuvastatin (CRESTOR) 40 MG tablet Take 0.5 tablets (20 mg total) by mouth daily.   No facility-administered encounter medications on file as of 02/17/2022.    Past Surgical History:  Procedure Laterality Date   heart stent  06/21/2013   x 1 to distal LCX   LEFT HEART CATHETERIZATION WITH CORONARY ANGIOGRAM N/A 06/21/2013   Procedure:  LEFT HEART CATHETERIZATION WITH CORONARY ANGIOGRAM;  Surgeon: Peter M Martinique, MD;  Location: [facility] CATH LAB;  Service: Cardiovascular;  Laterality: N/A;   LUMBAR LAMINECTOMY/DECOMPRESSION MICRODISCECTOMY N/A 11/28/2020   Procedure: LUMBAR DECOMPRESSION  2 LEVELS, L3 and L5;  Surgeon: Melina Schools, MD;  Location: [facility];  Service: Orthopedics;  Laterality: N/A;  3.5 hrs   TRANSURETHRAL RESECTION OF PROSTATE N/A 03/28/2021   Procedure: TRANSURETHRAL RESECTION OF THE PROSTATE (TURP)/ BIPOLAR;  Surgeon: Janith Lima, MD;  Location: [facility];  Service: Urology;  Laterality: N/A;    Family History  Problem Relation Age of Onset   Diabetes Mother    ALS Mother    Alzheimer's disease Father    Healthy Sister    Diabetes Brother       Controlled substance contract: n/a     Review of Systems  Constitutional:  Negative for chills, diaphoresis and fever.  Eyes:  Negative for pain.  Respiratory:  Negative for chest tightness and shortness of breath.   Cardiovascular:  Negative for chest pain, palpitations and leg swelling.  Gastrointestinal:  Negative for abdominal pain.  Endocrine: Negative for polydipsia.  Genitourinary:  Positive for frequency. Negative for difficulty urinating.  Musculoskeletal:  Positive for back pain (chronic).  Skin:  Negative for rash.  Neurological:  Negative for dizziness, weakness and light-headedness.  Hematological:  Does not bruise/bleed easily.  All other systems reviewed and are negative.      Objective:   Physical Exam Vitals and nursing note reviewed.  Constitutional:      Appearance: Normal appearance. He is well-developed and well-groomed.  HENT:     Head: Normocephalic and atraumatic.     Right Ear: Tympanic membrane normal.     Left Ear: Tympanic membrane normal.     Nose: Nose normal.     Mouth/Throat:     Lips: Pink.     Mouth: Mucous membranes are moist.     Pharynx: Oropharynx is clear.  Eyes:     Pupils: Pupils are  equal, round, and reactive to light.  Neck:     Thyroid: No thyroid mass, thyromegaly or thyroid tenderness.     Vascular: No carotid bruit or JVD.  Cardiovascular:     Rate and Rhythm: Normal rate and regular rhythm.     Pulses: Normal pulses.     Heart sounds: S1 normal and S2 normal.  Pulmonary:     Effort: Pulmonary effort is normal.     Breath sounds: Normal breath sounds. No wheezing, rhonchi or rales.  Chest:     Chest wall: No tenderness.  Abdominal:     General: Bowel sounds are normal. There is no distension or abdominal bruit.     Palpations: Abdomen is soft. There is no hepatomegaly, splenomegaly or  mass.     Tenderness: There is no abdominal tenderness.  Musculoskeletal:     Cervical back: Normal range of motion and neck supple.     Right lower leg: No edema.     Left lower leg: No edema.  Lymphadenopathy:     Cervical: No cervical adenopathy.  Skin:    General: Skin is warm and dry.     Capillary Refill: Capillary refill takes less than 2 seconds.     Findings: No rash.  Neurological:     Mental Status: He is alert and oriented to person, place, and time.  Psychiatric:        Attention and Perception: Attention normal.        Mood and Affect: Mood and affect normal.        Speech: Speech normal.        Behavior: Behavior normal.        Thought Content: Thought content normal.        Judgment: Judgment normal.   BP (!) 142/66   Pulse 75   Temp 97.6 F (36.4 C) (Temporal)   Ht _0  (1.778 m)   Wt 224 lb 8 oz (101.8 kg)   SpO2 98%   BMI 32.21 kg/m          Assessment & Plan:  DAMAIN BROADUS comes in today with chief complaint of Medical Management of Chronic Issues, Hypertension, Diabetes, and Hyperlipidemia   Diagnosis and orders addressed:  1. Primary hypertension Low sodium diet. - CBC with Differential/Platelet - CMP14+EGFR - amLODipine (NORVASC) 5 MG tablet; Take 1 tablet (5 mg total) by mouth daily.  Dispense: 90 tablet; Refill: 1 -  lisinopril (ZESTRIL) 20 MG tablet; Take 1 tablet (20 mg total) by mouth daily.  Dispense: 90 tablet; Refill: 1 - CBC with Differential/Platelet - CMP14+EGFR  2. Pure hypercholesterolemia Low fat diet. - Lipid panel - rosuvastatin (CRESTOR) 40 MG tablet; Take 0.5 tablets (20 mg total) by mouth daily.  Dispense: 135 tablet; Refill: 1 - Lipid panel  3. Type 2 diabetes mellitus with diabetic polyneuropathy, with long-term current use of insulin ([facility]) Continue to watch carb intake. - Bayer DCA Hb A1c Waived - glimepiride (AMARYL) 4 MG tablet; Take 1 tablet (4 mg total) by mouth 2 (two) times daily.  Dispense: 180 tablet; Refill: 1 - JANUMET 50-1000 MG tablet; Take 1 tablet by mouth 2 (two) times daily.  Dispense: 180 tablet; Refill: 1 - Microalbumin / creatinine urine ratio - insulin glargine (LANTUS SOLOSTAR) 100 UNIT/ML Solostar Pen; Inject 60 Units into the skin at bedtime.  Dispense: 30 mL; Refill: 3  4. Atherosclerosis of native coronary artery of native heart with angina pectoris with documented spasm ([facility]) - metoprolol tartrate (LOPRESSOR) 25 MG tablet; Take 1 tablet (25 mg total) by mouth 2 (two) times daily.  Dispense: 180 tablet; Refill: 1 - Insulin Pen Needle (PEN NEEDLES) 31G X 8 MM MISC; Use with lantus solosar pen daily  Dispense: 100 each; Refill: 2 5. Ischemic heart disease due to coronary artery obstruction ([place]) 6. Chronic systolic CHF (congestive heart failure) ([place]) Follow up with cardiology as needed.  7. Hypokalemia Labs pending.  8. DDD (degenerative disc disease), lumbar Mack stretches.   9. Benign prostatic hyperplasia without lower urinary tract symptoms Report voiding issues.  10. Morbid obesity ([place]) Discussed diet and exercise for person with BMI >25. Will recheck weight in 3-6 months.  11. Atherosclerosis of native coronary artery of native heart without angina pectoris Follow  up with cardiology as needed - omega-3 acid ethyl esters (LOVAZA) 1 g  capsule; Take 1 capsule (1 g total) by mouth 2 (two) times daily.  Dispense: 180 capsule; Refill: 3  12. Chronic midline low back pain without sciatica  - celecoxib (CELEBREX) 200 MG capsule; Take 1 capsule (200 mg total) by mouth 2 (two) times daily.  Dispense: 60 capsule; Refill: 2  13. Type 2 diabetes mellitus with hyperglycemia, without long-term current use of insulin (HCC) Continue to watch carb intake - Insulin Pen Needle (PEN NEEDLES) 31G X 8 MM MISC; Use with lantus solosar pen daily  Dispense: 100 each; Refill: 2   Labs pending Health Maintenance reviewed Diet and exercise encouraged  Follow up plan: 3 months   [name] Hassell Done, [name]

## 2022-02-18 ENCOUNTER — Other Ambulatory Visit: Payer: Self-pay | Admitting: *Deleted

## 2022-02-18 NOTE — Patient Outreach (Signed)
  Care Coordination   02/18/2022  Name: HILL MACKIE MRN: [id number] DOB: 25-May-1956   Care Coordination Outreach Attempts:  A second unsuccessful outreach was attempted today to offer the patient with information about available care coordination services as a benefit of their health plan.   HIPAA compliant message left on voicemail for patient, providing contact information for CSW, encouraging patient to return CSW's call at his earliest convenience.  Follow Up Plan:  Additional outreach attempts will be made to offer the patient care coordination information and services.   Encounter Outcome:  No Answer.   Care Coordination Interventions Activated:  No.    Care Coordination Interventions:  No, not indicated.    Nat Christen, BSW, MSW, LCSW  Licensed Education officer, [facility]  Mailing [address] [place], [place] [address] Physical Address-[address], [place], [place] [address] Toll Free Main # [phone] Fax # [phone] Cell # [phone] [email or web address]

## 2022-02-25 ENCOUNTER — Other Ambulatory Visit: Payer: Self-pay | Admitting: *Deleted

## 2022-02-25 NOTE — Patient Outreach (Signed)
  Care Coordination   02/25/2022  Name: Tyler Garner MRN: [id number] DOB: 28-Aug-1955   Care Coordination Outreach Attempts:  A third unsuccessful outreach was attempted today to offer the patient with information about available care coordination services as a benefit of their health plan. HIPAA compliant message left on voicemail, providing contact information for CSW, encouraging patient to return CSW's call at his earliest convenience.  Follow Up Plan:  No further outreach attempts will be made at this time. We have been unable to contact the patient to offer or enroll patient in care coordination services.  Encounter Outcome:  No Answer.   Care Coordination Interventions Activated:  No.    Care Coordination Interventions:  No, not indicated.    Nat Christen, BSW, MSW, LCSW  Licensed Education officer, [facility]  Mailing [address], [place], [place] [address] Physical Address-[address] [place], [place] [address] Toll Free Main # [phone] Fax # [phone] Cell # [phone] [email or web address]

## 2022-05-06 ENCOUNTER — Ambulatory Visit (INDEPENDENT_AMBULATORY_CARE_PROVIDER_SITE_OTHER): Payer: Medicare Other

## 2022-05-06 VITALS — Ht 70.0 in | Wt 220.0 lb

## 2022-05-06 DIAGNOSIS — Z0000 Encounter for general adult medical examination without abnormal findings: Secondary | ICD-10-CM | POA: Diagnosis not present

## 2022-05-06 NOTE — Progress Notes (Signed)
 Subjective:   Tyler Garner is a 66 y.o. male who presents for Medicare Annual/Subsequent preventive examination. I connected with  Tyler Garner on 05/06/22 by a audio enabled telemedicine application and verified that I am speaking with the correct person using two identifiers.  Patient Location: Home  Provider Location: Home Office  I discussed the limitations of evaluation and management by telemedicine. The patient expressed understanding and agreed to proceed.  Review of Systems     Cardiac Risk Factors include: advanced age (>55men, >65 women);hypertension;male gender;diabetes mellitus     Objective:    Today's Vitals   05/06/22 1045  Weight: 220 lb (99.8 kg)  Height: 5' 10" (1.778 m)   Body mass index is 31.57 kg/m.     05/06/2022   10:48 AM 04/16/2021   11:23 AM 03/28/2021    7:22 AM 01/09/2021   11:29 AM 11/30/2020    5:36 PM 11/28/2020    3:10 PM 11/28/2020    6:17 AM  Advanced Directives  Does Patient Have a Medical Advance Directive? Yes Yes Yes Yes No No No  Type of Advance Directive Healthcare Power of Attorney;Living will Healthcare Power of Attorney;Living will Living will      Copy of Healthcare Power of Attorney in Chart? No - copy requested No - copy requested       Would patient like information on creating a medical advance directive?     No - Patient declined No - Patient declined No - Patient declined    Current Medications (verified) Outpatient Encounter Medications as of 05/06/2022  Medication Sig   Accu-Chek Softclix Lancets lancets Check blood sugar Garner and prn  Dx E11.9   amLODipine (NORVASC) 5 MG tablet Take 1 tablet (5 mg total) by mouth Garner.   Blood Glucose Monitoring Suppl (ACCU-CHEK GUIDE ME) w/Device KIT CHECK BLOOD SUGAR Garner AND AS NEEDED DX E11.9   celecoxib (CELEBREX) 200 MG capsule Take 1 capsule (200 mg total) by mouth 2 (two) times Garner.   glimepiride (AMARYL) 4 MG tablet Take 1 tablet (4 mg total) by mouth 2 (two) times  Garner.   glucose blood (ACCU-CHEK GUIDE) test strip CHECK BLOOD SUGAR Garner AND AS NEEDED DX E11.9   insulin glargine (LANTUS SOLOSTAR) 100 UNIT/ML Solostar Pen Inject 60 Units into the skin at bedtime.   Insulin Pen Needle (PEN NEEDLES) 31G X 8 MM MISC Use with lantus solosar pen Garner   JANUMET 50-1000 MG tablet Take 1 tablet by mouth 2 (two) times Garner.   lisinopril (ZESTRIL) 20 MG tablet Take 1 tablet (20 mg total) by mouth Garner.   metoprolol tartrate (LOPRESSOR) 25 MG tablet Take 1 tablet (25 mg total) by mouth 2 (two) times Garner.   omega-3 acid ethyl esters (LOVAZA) 1 g capsule Take 1 capsule (1 g total) by mouth 2 (two) times Garner.   ondansetron (ZOFRAN) 4 MG tablet Take 1 tablet (4 mg total) by mouth Garner as needed for up to 30 doses for nausea or vomiting.   rosuvastatin (CRESTOR) 40 MG tablet Take 0.5 tablets (20 mg total) by mouth Garner.   No facility-administered encounter medications on file as of 05/06/2022.    Allergies (verified) Percocet [oxycodone-acetaminophen]   History: Past Medical History:  Diagnosis Date   Arthritis    hands and hips oa   CAD (coronary artery disease)    a. inferior STEMI (05/2013) with RV involvement- LHC:  dist CFX occluded (Promus premier (2.5x24 mm) DES); EF 40-45% with inf   HK;  b. Echo (report pending) with EF 45-50%, lat WMA, RVE, mod RV hypokinesis    Complication of anesthesia 11/28/2020   confusion x 1 hour after 11-28-2020 back surgery   Foley catheter in place    since 11-28-2020 foley last changed 1 week ago as of 03-25-2021   History of kidney stones    Hyperlipidemia    Hypertension    Ischemic cardiomyopathy    Myocardial infarction [facility] 2015   Nicotine addiction    Renal colic    Right shoulder injury 10/11/2009   Type 2 DM    Urinary retention    since july 2022   Past Surgical History:  Procedure Laterality Date   heart stent  06/21/2013   x 1 to distal LCX   LEFT HEART CATHETERIZATION WITH CORONARY ANGIOGRAM N/A  06/21/2013   Procedure: LEFT HEART CATHETERIZATION WITH CORONARY ANGIOGRAM;  Surgeon: Peter M Martinique, MD;  Location: [facility] CATH LAB;  Service: Cardiovascular;  Laterality: N/A;   LUMBAR LAMINECTOMY/DECOMPRESSION MICRODISCECTOMY N/A 11/28/2020   Procedure: LUMBAR DECOMPRESSION  2 LEVELS, L3 and L5;  Surgeon: Melina Schools, MD;  Location: [place];  Service: Orthopedics;  Laterality: N/A;  3.5 hrs   TRANSURETHRAL RESECTION OF PROSTATE N/A 03/28/2021   Procedure: TRANSURETHRAL RESECTION OF THE PROSTATE (TURP)/ BIPOLAR;  Surgeon: Janith Lima, MD;  Location: [facility];  Service: Urology;  Laterality: N/A;   Family History  Problem Relation Age of Onset   Diabetes Mother    ALS Mother    Alzheimer's disease Father    Healthy Sister    Diabetes Brother    Social History   Socioeconomic History   Marital status: Widowed    Spouse name: Not on file   Number of children: 2   Years of education: Not on file   Highest education level: Not on file  Occupational History   Occupation: Retired  Tobacco Use   Smoking status: Former    Packs/day: 1.00    Years: 40.00    Total pack years: 40.00    Types: Cigarettes    Quit date: 05/25/2010    Years since quitting: 11.9   Smokeless tobacco: Former    Quit date: 05/25/2008  Vaping Use   Vaping Use: Never used  Substance and Sexual Activity   Alcohol use: Not Currently   Drug use: No   Sexual activity: Not on file  Other Topics Concern   Not on file  Social History Narrative   Lives alone. 2 children: One in [place], another in [place] Strain: Low Risk  (05/06/2022)   Overall Financial Resource Strain (CARDIA)    Difficulty of Paying Living Expenses: Not hard at all  Food Insecurity: No Food Insecurity (05/06/2022)   Hunger Vital Sign    Worried About Running Out of Food in the Last Year: Never true    Ran Out of Food in the Last Year: Never true  Transportation Needs:  No Transportation Needs (05/06/2022)   PRAPARE - Hydrologist (Medical): No    Lack of Transportation (Non-Medical): No  Physical Activity: Insufficiently Active (05/06/2022)   Exercise Vital Sign    Days of Exercise per Week: 3 days    Minutes of Exercise per Session: 30 min  Stress: No Stress Concern Present (05/06/2022)   [place]    Feeling of Stress : Not at all  Social Connections: Socially Isolated (05/06/2022)   Social Connection and Isolation Panel [NHANES]    Frequency of Communication with Friends and Family: More than three times a week    Frequency of Social Gatherings with Friends and Family: More than three times a week    Attends Religious Services: Never    Marine scientist or Organizations: No    Attends Archivist Meetings: Never    Marital Status: Widowed    Tobacco Counseling Counseling given: Not Answered   Clinical Intake:  Pre-visit preparation completed: Yes  Pain : No/denies pain     Nutritional Risks: None Diabetes: No  How often do you need to have someone help you when you read instructions, pamphlets, or other written materials from your doctor or pharmacy?: 1 - Never  Diabetic?yes Nutrition Risk Assessment:  Has the patient had any N/V/D within the last 2 months?  No  Does the patient have any non-healing wounds?  No  Has the patient had any unintentional weight loss or weight gain?  No   Diabetes:  Is the patient diabetic?  Yes  If diabetic, was a CBG obtained today?  No  Did the patient bring in their glucometer from home?  No  How often do you monitor your CBG's? Garner .   Financial Strains and Diabetes Management:  Are you having any financial strains with the device, your supplies or your medication? No .  Does the patient want to be seen by Chronic Care Management for management of their diabetes?  No  Would the  patient like to be referred to a Nutritionist or for Diabetic Management?  No   Diabetic Exams:  Diabetic Eye Exam: Overdue for diabetic eye exam. Pt has been advised about the importance in completing this exam. Patient advised to call and schedule an eye exam. Diabetic Foot Exam: Overdue, Pt has been advised about the importance in completing this exam. Pt is scheduled for diabetic foot exam on next office visit .   Interpreter Needed?: No  Information entered by :: Jadene Pierini, LPN   Activities of Garner Living    05/06/2022   10:48 AM  In your present state of health, do you have any difficulty performing the following activities:  Hearing? 0  Vision? 0  Difficulty concentrating or making decisions? 0  Walking or climbing stairs? 0  Dressing or bathing? 0  Doing errands, shopping? 0  Preparing Food and eating ? N  Using the Toilet? N  In the past six months, have you accidently leaked urine? N  Do you have problems with loss of bowel control? N  Managing your Medications? N  Managing your Finances? N  Housekeeping or managing your Housekeeping? N    Patient Care Team: Chevis Pretty, [name] as PCP - General (Nurse Practitioner)  Indicate any recent Medical Services you may have received from other than [facility] providers in the past year (date may be approximate).     Assessment:   This is a routine wellness examination for Kelly.  Hearing/Vision screen Vision Screening - Comments:: Due eye exam patient to call schedule   Dietary issues and exercise activities discussed: Current Exercise Habits: Home exercise routine, Type of exercise: walking, Time (Minutes): 30, Frequency (Times/Week): 3, Weekly Exercise (Minutes/Week): 90, Intensity: Mild, Exercise limited by: orthopedic condition(s)   Goals Addressed             This Visit's Progress    LIFESTYLE - DECREASE FALLS RISK   On  track      Depression Screen    05/06/2022   10:47 AM 02/17/2022    8:14 AM  11/21/2021    9:05 AM 08/19/2021   10:50 AM 05/20/2021   10:48 AM 04/16/2021   11:25 AM 02/18/2021    9:35 AM  PHQ 2/9 Scores  PHQ - 2 Score 0 0 0 0 0 0 0  PHQ- 9 Score  _0 Fall Risk    05/06/2022   10:46 AM 02/17/2022    8:14 AM 11/21/2021    9:04 AM 08/19/2021   10:50 AM 05/20/2021   10:48 AM  Fall Risk   Falls in the past year? 0 0 0 1 1  Number falls in past yr: 0   0 0  Injury with Fall? 0   0 0  Risk for fall due to : No Fall Risks   History of fall(s) History of fall(s)  Follow up Falls prevention discussed   Education provided Education provided    FALL RISK PREVENTION PERTAINING TO THE HOME:  Any stairs in or around the home? Yes  If so, are there any without handrails? No  Home free of loose throw rugs in walkways, pet beds, electrical cords, etc? Yes  Adequate lighting in your home to reduce risk of falls? Yes   ASSISTIVE DEVICES UTILIZED TO PREVENT FALLS:  Life alert? No  Use of a cane, walker or w/c? No  Grab bars in the bathroom? Yes  Shower chair or bench in shower? Yes  Elevated toilet seat or a handicapped toilet? Yes        05/06/2022   10:48 AM 04/16/2021   11:26 AM  6CIT Screen  What Year? 0 points 0 points  What month? 0 points 0 points  What time? 0 points 0 points  Count back from 20 0 points 0 points  Months in reverse 0 points 0 points  Repeat phrase 0 points 0 points  Total Score 0 points 0 points    Immunizations Immunization History  Administered Date(s) Administered   Fluad Quad(high Dose 65+) 02/22/2020, 02/18/2021, 02/17/2022   Influenza Inj Mdck Quad Pf 03/04/2017   Influenza,inj,Quad PF,6+ Mos 02/21/2013, 04/05/2014, 05/24/2015, 04/07/2016, 03/03/2018, 01/31/2019   Influenza-Unspecified 02/22/2017   Moderna SARS-COV2 Booster Vaccination 12/04/2020   Moderna Sars-Covid-2 Vaccination 11/07/2019, 12/05/2019   Pneumococcal Conjugate-13 08/04/2016   Td 08/11/2005   Tdap 08/27/2021    TDAP status: Up to date  Flu  Vaccine status: Up to date  Pneumococcal vaccine status: Up to date  Covid-19 vaccine status: Completed vaccines  Qualifies for Shingles Vaccine? Yes   Zostavax completed No   Shingrix Completed?: No.    Education has been provided regarding the importance of this vaccine. Patient has been advised to call insurance company to determine out of pocket expense if they have not yet received this vaccine. Advised may also receive vaccine at local pharmacy or Health Dept. Verbalized acceptance and understanding.  Screening Tests Health Maintenance  Topic Date Due   OPHTHALMOLOGY EXAM  Never done   Fecal DNA (Cologuard)  Never done   Lung Cancer Screening  Never done   Zoster Vaccines- Shingrix (1 of 2) Never done   COVID-19 Vaccine (4 - 2023-24 season) 01/23/2022   FOOT EXAM  02/18/2022   Pneumonia Vaccine 41+ Years old (2 - PPSV23 or PCV20) 05/20/2022 (Originally 09/25/2019)   HEMOGLOBIN A1C  08/18/2022   Diabetic kidney evaluation - eGFR measurement  11/22/2022   Diabetic kidney evaluation - Urine ACR  11/22/2022   Medicare Annual Wellness (AWV)  05/07/2023   DTaP/Tdap/Td (3 - Td or Tdap) 08/28/2031   INFLUENZA VACCINE  Completed   Hepatitis C Screening  Completed   HPV VACCINES  Aged Out    Health Maintenance  Health Maintenance Due  Topic Date Due   OPHTHALMOLOGY EXAM  Never done   Fecal DNA (Cologuard)  Never done   Lung Cancer Screening  Never done   Zoster Vaccines- Shingrix (1 of 2) Never done   COVID-19 Vaccine (4 - 2023-24 season) 01/23/2022   FOOT EXAM  02/18/2022    Colorectal cancer screening: Referral to GI placed to discuss with PCP . Pt aware the office will call re: appt.  Lung Cancer Screening: (Low Dose CT Chest recommended if Age 55-80 years, 30 pack-year currently smoking OR have quit w/in 15years.) does not qualify.   Lung Cancer Screening Referral: n/a  Additional Screening:  Hepatitis C Screening: does not qualify; Completed 11/25/2015  Vision  Screening: Recommended annual ophthalmology exams for early detection of glaucoma and other disorders of the eye. Is the patient up to date with their annual eye exam?  No  Who is the provider or what is the name of the office in which the patient attends annual eye exams? Patient to call schedule  If pt is not established with a provider, would they like to be referred to a provider to establish care? No .   Dental Screening: Recommended annual dental exams for proper oral hygiene  Community Resource Referral / Chronic Care Management: CRR required this visit?  No   CCM required this visit?  No      Plan:     I have personally reviewed and noted the following in the patient's chart:   Medical and social history Use of alcohol, tobacco or illicit drugs  Current medications and supplements including opioid prescriptions. Patient is not currently taking opioid prescriptions. Functional ability and status Nutritional status Physical activity Advanced directives List of other physicians Hospitalizations, surgeries, and ER visits in previous 12 months Vitals Screenings to include cognitive, depression, and falls Referrals and appointments  In addition, I have reviewed and discussed with patient certain preventive protocols, quality metrics, and best practice recommendations. A written personalized care plan for preventive services as well as general preventive health recommendations were provided to patient.     Laura L Wilson, LPN   05/06/2022   Nurse Notes: Patient to discuss Colonoscopy with PCP     

## 2022-05-06 NOTE — Patient Instructions (Signed)
Tyler Garner , Thank you for taking time to come for your Medicare Wellness Visit. I appreciate your ongoing commitment to your health goals. Please review the following plan we discussed and let me know if I can assist you in the future.   These are the goals we discussed:  Goals      LIFESTYLE - DECREASE FALLS RISK        This is a list of the screening recommended for you and due dates:  Health Maintenance  Topic Date Due   Eye exam for diabetics  Never done   Cologuard (Stool DNA test)  Never done   Screening for Lung Cancer  Never done   Zoster (Shingles) Vaccine (1 of 2) Never done   COVID-19 Vaccine (4 - 2023-24 season) 01/23/2022   Complete foot exam   02/18/2022   Pneumonia Vaccine (2 - PPSV23 or PCV20) 05/20/2022*   Hemoglobin A1C  08/18/2022   Yearly kidney function blood test for diabetes  11/22/2022   Yearly kidney health urinalysis for diabetes  11/22/2022   Medicare Annual Wellness Visit  05/07/2023   DTaP/Tdap/Td vaccine (3 - Td or Tdap) 08/28/2031   Flu Shot  Completed   Hepatitis C Screening: USPSTF Recommendation to screen - Ages 18-79 yo.  Completed   HPV Vaccine  Aged Out  *Topic was postponed. The date shown is not the original due date.    Advanced directives: Advance directive discussed with you today. I have provided a copy for you to complete at home and have notarized. Once this is complete please bring a copy in to our office so we can scan it into your chart.   Conditions/risks identified: Aim for 30 minutes of exercise or brisk walking, 6-8 glasses of water, and 5 servings of fruits and vegetables each day.   Next appointment: Follow up in one year for your annual wellness visit.   Preventive Care 68 Years and Older, Male  Preventive care refers to lifestyle choices and visits with your health care provider that can promote health and wellness. What does preventive care include? A yearly physical exam. This is also called an annual well  check. Dental exams once or twice a year. Routine eye exams. Ask your health care provider how often you should have your eyes checked. Personal lifestyle choices, including: Daily care of your teeth and gums. Regular physical activity. Eating a healthy diet. Avoiding tobacco and drug use. Limiting alcohol use. Practicing safe sex. Taking low doses of aspirin every day. Taking vitamin and mineral supplements as recommended by your health care provider. What happens during an annual well check? The services and screenings done by your health care provider during your annual well check will depend on your age, overall health, lifestyle risk factors, and family history of disease. Counseling  Your health care provider may ask you questions about your: Alcohol use. Tobacco use. Drug use. Emotional well-being. Home and relationship well-being. Sexual activity. Eating habits. History of falls. Memory and ability to understand (cognition). Work and work Astronomer. Screening  You may have the following tests or measurements: Height, weight, and BMI. Blood pressure. Lipid and cholesterol levels. These may be checked every 5 years, or more frequently if you are over 21 years old. Skin check. Lung cancer screening. You may have this screening every year starting at age 38 if you have a 30-pack-year history of smoking and currently smoke or have quit within the past 15 years. Fecal occult blood test (FOBT) of the  stool. You may have this test every year starting at age 50. Flexible sigmoidoscopy or colonoscopy. You may have a sigmoidoscopy every 5 years or a colonoscopy every 10 years starting at age 50. Prostate cancer screening. Recommendations will vary depending on your family history and other risks. Hepatitis C blood test. Hepatitis B blood test. Sexually transmitted disease (STD) testing. Diabetes screening. This is done by checking your blood sugar (glucose) after you have not  eaten for a while (fasting). You may have this done every 1-3 years. Abdominal aortic aneurysm (AAA) screening. You may need this if you are a current or former smoker. Osteoporosis. You may be screened starting at age 70 if you are at high risk. Talk with your health care provider about your test results, treatment options, and if necessary, the need for more tests. Vaccines  Your health care provider may recommend certain vaccines, such as: Influenza vaccine. This is recommended every year. Tetanus, diphtheria, and acellular pertussis (Tdap, Td) vaccine. You may need a Td booster every 10 years. Zoster vaccine. You may need this after age 60. Pneumococcal 13-valent conjugate (PCV13) vaccine. One dose is recommended after age 65. Pneumococcal polysaccharide (PPSV23) vaccine. One dose is recommended after age 65. Talk to your health care provider about which screenings and vaccines you need and how often you need them. This information is not intended to replace advice given to you by your health care provider. Make sure you discuss any questions you have with your health care provider. Document Released: 06/07/2015 Document Revised: 01/29/2016 Document Reviewed: 03/12/2015 Elsevier Interactive Patient Education  2017 [employer]  Fall Prevention in the Home Falls can cause injuries. They can happen to people of all ages. There are many things you can do to make your home safe and to help prevent falls. What can I do on the outside of my home? Regularly fix the edges of walkways and driveways and fix any cracks. Remove anything that might make you trip as you walk through a door, such as a raised step or threshold. Trim any bushes or trees on the path to your home. Use bright outdoor lighting. Clear any walking paths of anything that might make someone trip, such as rocks or tools. Regularly check to see if handrails are loose or broken. Make sure that both sides of any steps have  handrails. Any raised decks and porches should have guardrails on the edges. Have any leaves, snow, or ice cleared regularly. Use sand or salt on walking paths during winter. Clean up any spills in your garage right away. This includes oil or grease spills. What can I do in the bathroom? Use night lights. Install grab bars by the toilet and in the tub and shower. Do not use towel bars as grab bars. Use non-skid mats or decals in the tub or shower. If you need to sit down in the shower, use a plastic, non-slip stool. Keep the floor dry. Clean up any water that spills on the floor as soon as it happens. Remove soap buildup in the tub or shower regularly. Attach bath mats securely with double-sided non-slip rug tape. Do not have throw rugs and other things on the floor that can make you trip. What can I do in the bedroom? Use night lights. Make sure that you have a light by your bed that is easy to reach. Do not use any sheets or blankets that are too big for your bed. They should not hang down onto the floor.   Have a firm chair that has side arms. You can use this for support while you get dressed. Do not have throw rugs and other things on the floor that can make you trip. What can I do in the kitchen? Clean up any spills right away. Avoid walking on wet floors. Keep items that you use a lot in easy-to-reach places. If you need to reach something above you, use a strong step stool that has a grab bar. Keep electrical cords out of the way. Do not use floor polish or wax that makes floors slippery. If you must use wax, use non-skid floor wax. Do not have throw rugs and other things on the floor that can make you trip. What can I do with my stairs? Do not leave any items on the stairs. Make sure that there are handrails on both sides of the stairs and use them. Fix handrails that are broken or loose. Make sure that handrails are as long as the stairways. Check any carpeting to make sure  that it is firmly attached to the stairs. Fix any carpet that is loose or worn. Avoid having throw rugs at the top or bottom of the stairs. If you do have throw rugs, attach them to the floor with carpet tape. Make sure that you have a light switch at the top of the stairs and the bottom of the stairs. If you do not have them, ask someone to add them for you. What else can I do to help prevent falls? Wear shoes that: Do not have high heels. Have rubber bottoms. Are comfortable and fit you well. Are closed at the toe. Do not wear sandals. If you use a stepladder: Make sure that it is fully opened. Do not climb a closed stepladder. Make sure that both sides of the stepladder are locked into place. Ask someone to hold it for you, if possible. Clearly mark and make sure that you can see: Any grab bars or handrails. First and last steps. Where the edge of each step is. Use tools that help you move around (mobility aids) if they are needed. These include: Canes. Walkers. Scooters. Crutches. Turn on the lights when you go into a dark area. Replace any light bulbs as soon as they burn out. Set up your furniture so you have a clear path. Avoid moving your furniture around. If any of your floors are uneven, fix them. If there are any pets around you, be aware of where they are. Review your medicines with your doctor. Some medicines can make you feel dizzy. This can increase your chance of falling. Ask your doctor what other things that you can do to help prevent falls. This information is not intended to replace advice given to you by your health care provider. Make sure you discuss any questions you have with your health care provider. Document Released: 03/07/2009 Document Revised: 10/17/2015 Document Reviewed: 06/15/2014 Elsevier Interactive Patient Education  2017 [employer].

## 2022-05-19 ENCOUNTER — Encounter: Payer: Self-pay | Admitting: Nurse Practitioner

## 2022-05-19 ENCOUNTER — Ambulatory Visit (INDEPENDENT_AMBULATORY_CARE_PROVIDER_SITE_OTHER): Payer: Medicare Other | Admitting: Nurse Practitioner

## 2022-05-19 VITALS — BP 139/76 | HR 61 | Temp 97.7°F | Resp 20 | Ht 70.0 in | Wt 217.0 lb

## 2022-05-19 DIAGNOSIS — E1142 Type 2 diabetes mellitus with diabetic polyneuropathy: Secondary | ICD-10-CM

## 2022-05-19 DIAGNOSIS — I5022 Chronic systolic (congestive) heart failure: Secondary | ICD-10-CM

## 2022-05-19 DIAGNOSIS — E7800 Pure hypercholesterolemia, unspecified: Secondary | ICD-10-CM

## 2022-05-19 DIAGNOSIS — E876 Hypokalemia: Secondary | ICD-10-CM

## 2022-05-19 DIAGNOSIS — Z794 Long term (current) use of insulin: Secondary | ICD-10-CM | POA: Diagnosis not present

## 2022-05-19 DIAGNOSIS — I10 Essential (primary) hypertension: Secondary | ICD-10-CM | POA: Diagnosis not present

## 2022-05-19 DIAGNOSIS — N400 Enlarged prostate without lower urinary tract symptoms: Secondary | ICD-10-CM

## 2022-05-19 DIAGNOSIS — Z125 Encounter for screening for malignant neoplasm of prostate: Secondary | ICD-10-CM

## 2022-05-19 LAB — BAYER DCA HB A1C WAIVED: HB A1C (BAYER DCA - WAIVED): 8 % — ABNORMAL HIGH (ref 4.8–5.6)

## 2022-05-19 NOTE — Patient Instructions (Signed)

## 2022-05-19 NOTE — Progress Notes (Signed)
Subjective:    Patient ID: Tyler Garner, male    DOB: 1955-05-26, 66 y.o.   MRN: [id number]   Chief Complaint: Medical Management of Chronic Issues    HPI:  Tyler Garner is a 66 y.o. who identifies as a male who was assigned male at birth.   Social history: Lives with: his son lived with him Work history: retired   Scientist, forensic in today for follow up of the following chronic medical issues:  1. Primary hypertension No c/o chest pain, sob or headache. Does not check blood pressure at home. BP Readings from Last 3 Encounters:  05/19/22 139/76  02/17/22 (!) 142/66  11/21/21 (!) 110/51     2. Chronic systolic CHF (congestive heart failure) ([place]) Has not seen cardiology in several years- says he is feeling fine .  3. Pure hypercholesterolemia Doe snot really watch diet and does no dedicated exercise. Lab Results  Component Value Date   CHOL 122 11/21/2021   HDL 23 (L) 11/21/2021   LDLCALC 61 11/21/2021   TRIG 233 (H) 11/21/2021   CHOLHDL 5.3 (H) 11/21/2021     4. Hypokalemia No c/o muscle cramps  5. Type 2 diabetes mellitus with diabetic polyneuropathy, with long-term current use of insulin (HCC) His fasting blood sugars run from 80-140. No low blood sugars  6. Prostate cancer screening Lab Results  Component Value Date   PSA1 0.9 02/18/2021   PSA1 0.7 11/16/2019   PSA 0.68 12/05/2012      7. Benign prostatic hyperplasia without lower urinary tract symptoms No voiding issues  8. Morbid obesity ([place]) No recent weight changes Wt Readings from Last 3 Encounters:  05/19/22 217 lb (98.4 kg)  05/06/22 220 lb (99.8 kg)  02/17/22 224 lb 8 oz (101.8 kg)   BMI Readings from Last 3 Encounters:  05/19/22 31.14 kg/m  05/06/22 31.57 kg/m  02/17/22 32.21 kg/m      New complaints: None today  Allergies  Allergen Reactions   Percocet [Oxycodone-Acetaminophen] Nausea And Vomiting   Outpatient Encounter Medications as of 05/19/2022  Medication Sig    Accu-Chek Softclix Lancets lancets Check blood sugar daily and prn  Dx E11.9   amLODipine (NORVASC) 5 MG tablet Take 1 tablet (5 mg total) by mouth daily.   Blood Glucose Monitoring Suppl (ACCU-CHEK GUIDE ME) w/Device KIT CHECK BLOOD SUGAR DAILY AND AS NEEDED DX E11.9   celecoxib (CELEBREX) 200 MG capsule Take 1 capsule (200 mg total) by mouth 2 (two) times daily.   glimepiride (AMARYL) 4 MG tablet Take 1 tablet (4 mg total) by mouth 2 (two) times daily.   glucose blood (ACCU-CHEK GUIDE) test strip CHECK BLOOD SUGAR DAILY AND AS NEEDED DX E11.9   insulin glargine (LANTUS SOLOSTAR) 100 UNIT/ML Solostar Pen Inject 60 Units into the skin at bedtime.   Insulin Pen Needle (PEN NEEDLES) 31G X 8 MM MISC Use with lantus solosar pen daily   JANUMET 50-1000 MG tablet Take 1 tablet by mouth 2 (two) times daily.   lisinopril (ZESTRIL) 20 MG tablet Take 1 tablet (20 mg total) by mouth daily.   metoprolol tartrate (LOPRESSOR) 25 MG tablet Take 1 tablet (25 mg total) by mouth 2 (two) times daily.   omega-3 acid ethyl esters (LOVAZA) 1 g capsule Take 1 capsule (1 g total) by mouth 2 (two) times daily.   ondansetron (ZOFRAN) 4 MG tablet Take 1 tablet (4 mg total) by mouth daily as needed for up to 30 doses for nausea or vomiting.  rosuvastatin (CRESTOR) 40 MG tablet Take 0.5 tablets (20 mg total) by mouth daily.   No facility-administered encounter medications on file as of 05/19/2022.    Past Surgical History:  Procedure Laterality Date   heart stent  06/21/2013   x 1 to distal LCX   LEFT HEART CATHETERIZATION WITH CORONARY ANGIOGRAM N/A 06/21/2013   Procedure: LEFT HEART CATHETERIZATION WITH CORONARY ANGIOGRAM;  Surgeon: Peter M Martinique, MD;  Location: [facility] CATH LAB;  Service: Cardiovascular;  Laterality: N/A;   LUMBAR LAMINECTOMY/DECOMPRESSION MICRODISCECTOMY N/A 11/28/2020   Procedure: LUMBAR DECOMPRESSION  2 LEVELS, L3 and L5;  Surgeon: Melina Schools, MD;  Location: [facility];  Service: Orthopedics;   Laterality: N/A;  3.5 hrs   TRANSURETHRAL RESECTION OF PROSTATE N/A 03/28/2021   Procedure: TRANSURETHRAL RESECTION OF THE PROSTATE (TURP)/ BIPOLAR;  Surgeon: Janith Lima, MD;  Location: [facility];  Service: Urology;  Laterality: N/A;    Family History  Problem Relation Age of Onset   Diabetes Mother    ALS Mother    Alzheimer's disease Father    Healthy Sister    Diabetes Brother       Controlled substance contract: n/a     Review of Systems  Constitutional:  Negative for diaphoresis.  Eyes:  Negative for pain.  Respiratory:  Negative for shortness of breath.   Cardiovascular:  Negative for chest pain, palpitations and leg swelling.  Gastrointestinal:  Negative for abdominal pain.  Endocrine: Negative for polydipsia.  Skin:  Negative for rash.  Neurological:  Negative for dizziness, weakness and headaches.  Hematological:  Does not bruise/bleed easily.  All other systems reviewed and are negative.      Objective:   Physical Exam Vitals and nursing note reviewed.  Constitutional:      Appearance: Normal appearance. He is well-developed.  HENT:     Head: Normocephalic.     Nose: Nose normal.     Mouth/Throat:     Mouth: Mucous membranes are moist.     Pharynx: Oropharynx is clear.  Eyes:     Pupils: Pupils are equal, round, and reactive to light.  Neck:     Thyroid: No thyroid mass or thyromegaly.     Vascular: No carotid bruit or JVD.     Trachea: Phonation normal.  Cardiovascular:     Rate and Rhythm: Normal rate and regular rhythm.  Pulmonary:     Effort: Pulmonary effort is normal. No respiratory distress.     Breath sounds: Normal breath sounds.  Abdominal:     General: Bowel sounds are normal.     Palpations: Abdomen is soft.     Tenderness: There is no abdominal tenderness.  Musculoskeletal:        General: Normal range of motion.     Cervical back: Normal range of motion and neck supple.  Lymphadenopathy:     Cervical: No  cervical adenopathy.  Skin:    General: Skin is warm and dry.  Neurological:     Mental Status: He is alert and oriented to person, place, and time.  Psychiatric:        Behavior: Behavior normal.        Thought Content: Thought content normal.        Judgment: Judgment normal.    BP 139/76   Pulse 61   Temp 97.7 F (36.5 C) (Temporal)   Resp 20   Ht _0  (1.778 m)   Wt 217 lb (98.4 kg)   SpO2 97%  BMI 31.14 kg/m         Assessment & Plan:   Tyler Garner comes in today with chief complaint of Medical Management of Chronic Issues   Diagnosis and orders addressed:  1. Primary hypertension Low sodium diet - CBC with Differential/Platelet - CMP14+EGFR  2. Chronic systolic CHF (congestive heart failure) ([place]) Report any sob  3. Pure hypercholesterolemia Low fat diet - Lipid panel  4. Hypokalemia Report any muscle cramps  5. Type 2 diabetes mellitus with diabetic polyneuropathy, with long-term current use of insulin (HCC) Continue watch carbs in diet - Bayer DCA Hb A1c Waived - Microalbumin / creatinine urine ratio  6. Prostate cancer screening - PSA, total and free  7. Benign prostatic hyperplasia without lower urinary tract symptoms  8. Morbid obesity ([place]) Discussed diet and exercise for person with BMI >25 Will recheck weight in 3-6 months    Labs pending Health Maintenance reviewed Diet and exercise encouraged  Follow up plan: 3 months   [name] Hassell Done, [name]

## 2022-05-20 LAB — CBC WITH DIFFERENTIAL/PLATELET
Basophils Absolute: 0.1 10*3/uL (ref 0.0–0.2)
Basos: 1 %
EOS (ABSOLUTE): 0.1 10*3/uL (ref 0.0–0.4)
Eos: 2 %
Hematocrit: 38.9 % (ref 37.5–51.0)
Hemoglobin: 12 g/dL — ABNORMAL LOW (ref 13.0–17.7)
Immature Grans (Abs): 0 10*3/uL (ref 0.0–0.1)
Immature Granulocytes: 0 %
Lymphocytes Absolute: 3 10*3/uL (ref 0.7–3.1)
Lymphs: 35 %
MCH: 23.3 pg — ABNORMAL LOW (ref 26.6–33.0)
MCHC: 30.8 g/dL — ABNORMAL LOW (ref 31.5–35.7)
MCV: 76 fL — ABNORMAL LOW (ref 79–97)
Monocytes Absolute: 0.8 10*3/uL (ref 0.1–0.9)
Monocytes: 9 %
Neutrophils Absolute: 4.5 10*3/uL (ref 1.4–7.0)
Neutrophils: 53 %
Platelets: 286 10*3/uL (ref 150–450)
RBC: 5.14 x10E6/uL (ref 4.14–5.80)
RDW: 14.8 % (ref 11.6–15.4)
WBC: 8.5 10*3/uL (ref 3.4–10.8)

## 2022-05-20 LAB — CMP14+EGFR
ALT: 12 IU/L (ref 0–44)
AST: 16 IU/L (ref 0–40)
Albumin/Globulin Ratio: 1.8 (ref 1.2–2.2)
Albumin: 4.7 g/dL (ref 3.9–4.9)
Alkaline Phosphatase: 59 IU/L (ref 44–121)
BUN/Creatinine Ratio: 16 (ref 10–24)
BUN: 15 mg/dL (ref 8–27)
Bilirubin Total: 0.2 mg/dL (ref 0.0–1.2)
CO2: 22 mmol/L (ref 20–29)
Calcium: 10 mg/dL (ref 8.6–10.2)
Chloride: 105 mmol/L (ref 96–106)
Creatinine, Ser: 0.91 mg/dL (ref 0.76–1.27)
Globulin, Total: 2.6 g/dL (ref 1.5–4.5)
Glucose: 103 mg/dL — ABNORMAL HIGH (ref 70–99)
Potassium: 5.2 mmol/L (ref 3.5–5.2)
Sodium: 144 mmol/L (ref 134–144)
Total Protein: 7.3 g/dL (ref 6.0–8.5)
eGFR: 92 mL/min/{1.73_m2} (ref >59)

## 2022-05-20 LAB — PSA, TOTAL AND FREE
PSA, Free Pct: 32 %
PSA, Free: 0.16 ng/mL
Prostate Specific Ag, Serum: 0.5 ng/mL (ref 0.0–4.0)

## 2022-05-20 LAB — LIPID PANEL
Chol/HDL Ratio: 5.5 ratio — ABNORMAL HIGH (ref 0.0–5.0)
Cholesterol, Total: 127 mg/dL (ref 100–199)
HDL: 23 mg/dL — ABNORMAL LOW (ref >39)
LDL Chol Calc (NIH): 71 mg/dL (ref 0–99)
Triglycerides: 195 mg/dL — ABNORMAL HIGH (ref 0–149)
VLDL Cholesterol Cal: 33 mg/dL (ref 5–40)

## 2022-08-18 ENCOUNTER — Ambulatory Visit (INDEPENDENT_AMBULATORY_CARE_PROVIDER_SITE_OTHER): Payer: Medicare Other | Admitting: Nurse Practitioner

## 2022-08-18 ENCOUNTER — Encounter: Payer: Self-pay | Admitting: Nurse Practitioner

## 2022-08-18 VITALS — BP 136/78 | HR 57 | Temp 97.9°F | Resp 20 | Ht 70.0 in | Wt 215.0 lb

## 2022-08-18 DIAGNOSIS — I25111 Atherosclerotic heart disease of native coronary artery with angina pectoris with documented spasm: Secondary | ICD-10-CM | POA: Diagnosis not present

## 2022-08-18 DIAGNOSIS — Z794 Long term (current) use of insulin: Secondary | ICD-10-CM | POA: Diagnosis not present

## 2022-08-18 DIAGNOSIS — E1142 Type 2 diabetes mellitus with diabetic polyneuropathy: Secondary | ICD-10-CM

## 2022-08-18 DIAGNOSIS — M51369 Other intervertebral disc degeneration, lumbar region without mention of lumbar back pain or lower extremity pain: Secondary | ICD-10-CM

## 2022-08-18 DIAGNOSIS — E876 Hypokalemia: Secondary | ICD-10-CM | POA: Diagnosis not present

## 2022-08-18 DIAGNOSIS — E1169 Type 2 diabetes mellitus with other specified complication: Secondary | ICD-10-CM

## 2022-08-18 DIAGNOSIS — M545 Low back pain, unspecified: Secondary | ICD-10-CM

## 2022-08-18 DIAGNOSIS — N400 Enlarged prostate without lower urinary tract symptoms: Secondary | ICD-10-CM

## 2022-08-18 DIAGNOSIS — G8929 Other chronic pain: Secondary | ICD-10-CM

## 2022-08-18 DIAGNOSIS — M5136 Other intervertebral disc degeneration, lumbar region: Secondary | ICD-10-CM

## 2022-08-18 DIAGNOSIS — I2510 Atherosclerotic heart disease of native coronary artery without angina pectoris: Secondary | ICD-10-CM | POA: Diagnosis not present

## 2022-08-18 DIAGNOSIS — I10 Essential (primary) hypertension: Secondary | ICD-10-CM | POA: Diagnosis not present

## 2022-08-18 DIAGNOSIS — E785 Hyperlipidemia, unspecified: Secondary | ICD-10-CM

## 2022-08-18 DIAGNOSIS — I5022 Chronic systolic (congestive) heart failure: Secondary | ICD-10-CM | POA: Diagnosis not present

## 2022-08-18 DIAGNOSIS — Z6830 Body mass index (BMI) 30.0-30.9, adult: Secondary | ICD-10-CM

## 2022-08-18 DIAGNOSIS — I240 Acute coronary thrombosis not resulting in myocardial infarction: Secondary | ICD-10-CM

## 2022-08-18 DIAGNOSIS — E7800 Pure hypercholesterolemia, unspecified: Secondary | ICD-10-CM

## 2022-08-18 DIAGNOSIS — Z0000 Encounter for general adult medical examination without abnormal findings: Secondary | ICD-10-CM

## 2022-08-18 DIAGNOSIS — I259 Chronic ischemic heart disease, unspecified: Secondary | ICD-10-CM

## 2022-08-18 LAB — BAYER DCA HB A1C WAIVED: HB A1C (BAYER DCA - WAIVED): 7.2 % — ABNORMAL HIGH (ref 4.8–5.6)

## 2022-08-18 MED ORDER — CELECOXIB 200 MG PO CAPS: 200.0000 mg | ORAL_CAPSULE | Freq: Two times a day (BID) | ORAL | 2 refills | Status: DC

## 2022-08-18 MED ORDER — METOPROLOL TARTRATE 25 MG PO TABS: 25.0000 mg | ORAL_TABLET | Freq: Two times a day (BID) | ORAL | 1 refills | Status: DC

## 2022-08-18 MED ORDER — AMLODIPINE BESYLATE 5 MG PO TABS: 5.0000 mg | ORAL_TABLET | Freq: Every day | ORAL | 1 refills | Status: DC

## 2022-08-18 MED ORDER — ROSUVASTATIN CALCIUM 40 MG PO TABS: 20.0000 mg | ORAL_TABLET | Freq: Every day | ORAL | 1 refills | Status: DC

## 2022-08-18 MED ORDER — LANTUS SOLOSTAR 100 UNIT/ML ~~LOC~~ SOPN: 55.0000 [IU] | PEN_INJECTOR | Freq: Every day | SUBCUTANEOUS | 3 refills | Status: DC

## 2022-08-18 MED ORDER — GLIMEPIRIDE 4 MG PO TABS: 4.0000 mg | ORAL_TABLET | Freq: Two times a day (BID) | ORAL | 1 refills | Status: DC

## 2022-08-18 MED ORDER — OMEGA-3-ACID ETHYL ESTERS 1 G PO CAPS: 1.0000 g | ORAL_CAPSULE | Freq: Two times a day (BID) | ORAL | 3 refills | Status: DC

## 2022-08-18 MED ORDER — LISINOPRIL 20 MG PO TABS: 20.0000 mg | ORAL_TABLET | Freq: Every day | ORAL | 1 refills | Status: DC

## 2022-08-18 MED ORDER — JANUMET 50-1000 MG PO TABS: 1.0000 | ORAL_TABLET | Freq: Two times a day (BID) | ORAL | 1 refills | Status: DC

## 2022-08-18 NOTE — Progress Notes (Signed)
Subjective:    Patient ID: Tyler Garner, male    DOB: 02/19/1956, 67 y.o.   MRN: [id number]   Chief Complaint: annual physical    HPI:  Tyler Garner is a 67 y.o. who identifies as a male who was assigned male at birth.   Social history: Lives with: his son lives with him Work history: retired   Scientist, forensic in today for follow up of the following chronic medical issues:  1. Annual physical exam  2. Primary hypertension No c/o chest pain, sob or headache. Does not check blood pressure at home. BP Readings from Last 3 Encounters:  05/19/22 139/76  02/17/22 (!) 142/66  11/21/21 (!) 110/51     3. Chronic systolic CHF (congestive heart failure) ([place]) 4. Atherosclerosis of native coronary artery of native heart with angina pectoris with documented spasm ([place]) 5. Ischemic heart disease due to coronary artery obstruction ([place]) Last aw cardiology in 02/2021. He had myocardial perfusion study  in 02/2021 which report showed heart was good. He has not been back since then to be seen.  6. Hyperlipidemia associated with type 2 diabetes mellitus ([place]) He does try  to watch diet but does not do much exercise. Lab Results  Component Value Date   CHOL 127 05/19/2022   HDL 23 (L) 05/19/2022   LDLCALC 71 05/19/2022   TRIG 195 (H) 05/19/2022   CHOLHDL 5.5 (H) 05/19/2022     7. Type 2 diabetes mellitus with diabetic polyneuropathy, with long-term current use of insulin (HCC) Fasting blood sugars are running around 70-130. No low blood sugars. No changes were made to meds at last appt. Lab Results  Component Value Date   HGBA1C 8.0 (H) 05/19/2022     8. Hypokalemia No c/o muscle cramps Lab Results  Component Value Date   K 5.2 05/19/2022     9. Benign prostatic hyperplasia without lower urinary tract symptoms No voinding issues Lab Results  Component Value Date   PSA1 0.5 05/19/2022   PSA1 0.9 02/18/2021   PSA1 0.7 11/16/2019   PSA 0.68 12/05/2012      10. DDD  (degenerative disc disease), lumbar He is doing okay. Has been using a cane to walk just to steady hisself. Pain is only when he is standing or walking  11. Morbid obesity ([place]) No recent weight changes Wt Readings from Last 3 Encounters:  08/18/22 215 lb (97.5 kg)  05/19/22 217 lb (98.4 kg)  05/06/22 220 lb (99.8 kg)   BMI Readings from Last 3 Encounters:  08/18/22 30.85 kg/m  05/19/22 31.14 kg/m  05/06/22 31.57 kg/m     New complaints: None today  Allergies  Allergen Reactions   Percocet [Oxycodone-Acetaminophen] Nausea And Vomiting   Outpatient Encounter Medications as of 08/18/2022  Medication Sig   Accu-Chek Softclix Lancets lancets Check blood sugar daily and prn  Dx E11.9   amLODipine (NORVASC) 5 MG tablet Take 1 tablet (5 mg total) by mouth daily.   Blood Glucose Monitoring Suppl (ACCU-CHEK GUIDE ME) w/Device KIT CHECK BLOOD SUGAR DAILY AND AS NEEDED DX E11.9   celecoxib (CELEBREX) 200 MG capsule Take 1 capsule (200 mg total) by mouth 2 (two) times daily.   glimepiride (AMARYL) 4 MG tablet Take 1 tablet (4 mg total) by mouth 2 (two) times daily.   glucose blood (ACCU-CHEK GUIDE) test strip CHECK BLOOD SUGAR DAILY AND AS NEEDED DX E11.9   insulin glargine (LANTUS SOLOSTAR) 100 UNIT/ML Solostar Pen Inject 60 Units into the skin at  bedtime.   Insulin Pen Needle (PEN NEEDLES) 31G X 8 MM MISC Use with lantus solosar pen daily   JANUMET 50-1000 MG tablet Take 1 tablet by mouth 2 (two) times daily.   lisinopril (ZESTRIL) 20 MG tablet Take 1 tablet (20 mg total) by mouth daily.   metoprolol tartrate (LOPRESSOR) 25 MG tablet Take 1 tablet (25 mg total) by mouth 2 (two) times daily.   omega-3 acid ethyl esters (LOVAZA) 1 g capsule Take 1 capsule (1 g total) by mouth 2 (two) times daily.   ondansetron (ZOFRAN) 4 MG tablet Take 1 tablet (4 mg total) by mouth daily as needed for up to 30 doses for nausea or vomiting.   rosuvastatin (CRESTOR) 40 MG tablet Take 0.5 tablets (20 mg  total) by mouth daily.   No facility-administered encounter medications on file as of 08/18/2022.    Past Surgical History:  Procedure Laterality Date   heart stent  06/21/2013   x 1 to distal LCX   LEFT HEART CATHETERIZATION WITH CORONARY ANGIOGRAM N/A 06/21/2013   Procedure: LEFT HEART CATHETERIZATION WITH CORONARY ANGIOGRAM;  Surgeon: Peter M Martinique, MD;  Location: [facility] CATH LAB;  Service: Cardiovascular;  Laterality: N/A;   LUMBAR LAMINECTOMY/DECOMPRESSION MICRODISCECTOMY N/A 11/28/2020   Procedure: LUMBAR DECOMPRESSION  2 LEVELS, L3 and L5;  Surgeon: Melina Schools, MD;  Location: [place];  Service: Orthopedics;  Laterality: N/A;  3.5 hrs   TRANSURETHRAL RESECTION OF PROSTATE N/A 03/28/2021   Procedure: TRANSURETHRAL RESECTION OF THE PROSTATE (TURP)/ BIPOLAR;  Surgeon: Janith Lima, MD;  Location: [facility];  Service: Urology;  Laterality: N/A;    Family History  Problem Relation Age of Onset   Diabetes Mother    ALS Mother    Alzheimer's disease Father    Healthy Sister    Diabetes Brother       Controlled substance contract: n/a     Review of Systems  Constitutional:  Negative for diaphoresis.  Eyes:  Negative for pain.  Respiratory:  Negative for shortness of breath.   Cardiovascular:  Negative for chest pain, palpitations and leg swelling.  Gastrointestinal:  Negative for abdominal pain.  Endocrine: Negative for polydipsia.  Skin:  Negative for rash.  Neurological:  Negative for dizziness, weakness and headaches.  Hematological:  Does not bruise/bleed easily.  All other systems reviewed and are negative.      Objective:   Physical Exam Vitals and nursing note reviewed.  Constitutional:      Appearance: Normal appearance. He is well-developed.  HENT:     Head: Normocephalic.     Nose: Nose normal.     Mouth/Throat:     Mouth: Mucous membranes are moist.     Pharynx: Oropharynx is clear.  Eyes:     Pupils: Pupils are equal, round, and  reactive to light.  Neck:     Thyroid: No thyroid mass or thyromegaly.     Vascular: No carotid bruit or JVD.     Trachea: Phonation normal.  Cardiovascular:     Rate and Rhythm: Normal rate and regular rhythm.  Pulmonary:     Effort: Pulmonary effort is normal. No respiratory distress.     Breath sounds: Normal breath sounds.  Abdominal:     General: Bowel sounds are normal.     Palpations: Abdomen is soft.     Tenderness: There is no abdominal tenderness.  Musculoskeletal:        General: Normal range of motion.     Cervical  back: Normal range of motion and neck supple.     Comments: Walking with cane  Lymphadenopathy:     Cervical: No cervical adenopathy.  Skin:    General: Skin is warm and dry.  Neurological:     Mental Status: He is alert and oriented to person, place, and time.  Psychiatric:        Behavior: Behavior normal.        Thought Content: Thought content normal.        Judgment: Judgment normal.     BP 136/78   Pulse (!) 57   Temp 97.9 F (36.6 C) (Temporal)   Resp 20   Ht 5\' 10"  (1.778 m)   Wt 215 lb (97.5 kg)   SpO2 99%   BMI 30.85 kg/m   Hgab1c 7.2%      Assessment & Plan:  Tyler Garner comes in today with chief complaint of Medical Management of Chronic Issues   Diagnosis and orders addressed:  1. Annual physical exam   2. Primary hypertension Low sodium diet - CBC with Differential/Platelet - CMP14+EGFR - amLODipine (NORVASC) 5 MG tablet; Take 1 tablet (5 mg total) by mouth daily.  Dispense: 90 tablet; Refill: 1 - lisinopril (ZESTRIL) 20 MG tablet; Take 1 tablet (20 mg total) by mouth daily.  Dispense: 90 tablet; Refill: 1  3. Chronic systolic CHF (congestive heart failure) ([place]) 4. Atherosclerosis of native coronary artery of native heart with angina pectoris with documented spasm (HCC) - metoprolol tartrate (LOPRESSOR) 25 MG tablet; Take 1 tablet (25 mg total) by mouth 2 (two) times daily.  Dispense: 180 tablet; Refill: 1 5.  Ischemic heart disease due to coronary artery obstruction ([place])  6. Hyperlipidemia associated with type 2 diabetes mellitus (HCC) Low fat diet - Lipid panel  7. Type 2 diabetes mellitus with diabetic polyneuropathy, with long-term current use of insulin (HCC) Decrease lantus to 55u daily- take daily even if blood sugars are normal - Bayer DCA Hb A1c Waived - Microalbumin / creatinine urine ratio - glimepiride (AMARYL) 4 MG tablet; Take 1 tablet (4 mg total) by mouth 2 (two) times daily.  Dispense: 180 tablet; Refill: 1 - insulin glargine (LANTUS SOLOSTAR) 100 UNIT/ML Solostar Pen; Inject 55 Units into the skin at bedtime.  Dispense: 30 mL; Refill: 3 - JANUMET 50-1000 MG tablet; Take 1 tablet by mouth 2 (two) times daily.  Dispense: 180 tablet; Refill: 1  8. Hypokalemia Labs pending  9. Benign prostatic hyperplasia without lower urinary tract symptoms Labs pending' report any issues voiding  10. DDD (degenerative disc disease), lumbar Continue celebrex  11. Morbid obesity ([place]) Discussed diet and exercise for person with BMI >25 Will recheck weight in 3-6 months   12. Atherosclerosis of native coronary artery of native heart without angina pectoris - omega-3 acid ethyl esters (LOVAZA) 1 g capsule; Take 1 capsule (1 g total) by mouth 2 (two) times daily.  Dispense: 180 capsule; Refill: 3  13. Chronic midline low back pain without sciatica  - celecoxib (CELEBREX) 200 MG capsule; Take 1 capsule (200 mg total) by mouth 2 (two) times daily.  Dispense: 60 capsule; Refill: 2  14. Pure hypercholesterolemia - rosuvastatin (CRESTOR) 40 MG tablet; Take 0.5 tablets (20 mg total) by mouth daily.  Dispense: 135 tablet; Refill: 1   Labs pending Health Maintenance reviewed Diet and exercise encouraged  Follow up plan: 3 months   [name] Hassell Done, [name]

## 2022-08-18 NOTE — Addendum Note (Signed)
Addended by: Chevis Pretty on: 08/18/2022 10:59 AM   Modules accepted: Level of Service

## 2022-08-19 LAB — CBC WITH DIFFERENTIAL/PLATELET
Basophils Absolute: 0.1 10*3/uL (ref 0.0–0.2)
Basos: 1 %
EOS (ABSOLUTE): 0.2 10*3/uL (ref 0.0–0.4)
Eos: 2 %
Hematocrit: 36.9 % — ABNORMAL LOW (ref 37.5–51.0)
Hemoglobin: 11.1 g/dL — ABNORMAL LOW (ref 13.0–17.7)
Immature Grans (Abs): 0 10*3/uL (ref 0.0–0.1)
Immature Granulocytes: 0 %
Lymphocytes Absolute: 3.6 10*3/uL — ABNORMAL HIGH (ref 0.7–3.1)
Lymphs: 34 %
MCH: 22.8 pg — ABNORMAL LOW (ref 26.6–33.0)
MCHC: 30.1 g/dL — ABNORMAL LOW (ref 31.5–35.7)
MCV: 76 fL — ABNORMAL LOW (ref 79–97)
Monocytes Absolute: 0.9 10*3/uL (ref 0.1–0.9)
Monocytes: 9 %
Neutrophils Absolute: 5.7 10*3/uL (ref 1.4–7.0)
Neutrophils: 54 %
Platelets: 328 10*3/uL (ref 150–450)
RBC: 4.87 x10E6/uL (ref 4.14–5.80)
RDW: 15.5 % — ABNORMAL HIGH (ref 11.6–15.4)
WBC: 10.5 10*3/uL (ref 3.4–10.8)

## 2022-08-19 LAB — CMP14+EGFR
ALT: 11 IU/L (ref 0–44)
AST: 23 IU/L (ref 0–40)
Albumin/Globulin Ratio: 2 (ref 1.2–2.2)
Albumin: 4.9 g/dL (ref 3.9–4.9)
Alkaline Phosphatase: 78 IU/L (ref 44–121)
BUN/Creatinine Ratio: 15 (ref 10–24)
BUN: 13 mg/dL (ref 8–27)
Bilirubin Total: 0.2 mg/dL (ref 0.0–1.2)
CO2: 21 mmol/L (ref 20–29)
Calcium: 9.8 mg/dL (ref 8.6–10.2)
Chloride: 103 mmol/L (ref 96–106)
Creatinine, Ser: 0.85 mg/dL (ref 0.76–1.27)
Globulin, Total: 2.4 g/dL (ref 1.5–4.5)
Glucose: 107 mg/dL — ABNORMAL HIGH (ref 70–99)
Potassium: 5.3 mmol/L — ABNORMAL HIGH (ref 3.5–5.2)
Sodium: 144 mmol/L (ref 134–144)
Total Protein: 7.3 g/dL (ref 6.0–8.5)
eGFR: 95 mL/min/{1.73_m2} (ref >59)

## 2022-08-19 LAB — LIPID PANEL
Chol/HDL Ratio: 4.8 ratio (ref 0.0–5.0)
Cholesterol, Total: 126 mg/dL (ref 100–199)
HDL: 26 mg/dL — ABNORMAL LOW (ref >39)
LDL Chol Calc (NIH): 62 mg/dL (ref 0–99)
Triglycerides: 232 mg/dL — ABNORMAL HIGH (ref 0–149)
VLDL Cholesterol Cal: 38 mg/dL (ref 5–40)

## 2022-08-19 LAB — MICROALBUMIN / CREATININE URINE RATIO
Creatinine, Urine: 67.6 mg/dL
Microalb/Creat Ratio: 50 mg/g creat — ABNORMAL HIGH (ref 0–29)
Microalbumin, Urine: 34 ug/mL

## 2022-09-09 IMAGING — DX DG CHEST 1V
1 series · 1 of 1 positions shown · non-contrast
Comparison: 06/21/2013

CLINICAL DATA: Pre-op clearance exam

EXAM:
CHEST  1 VIEW

[chest pa]
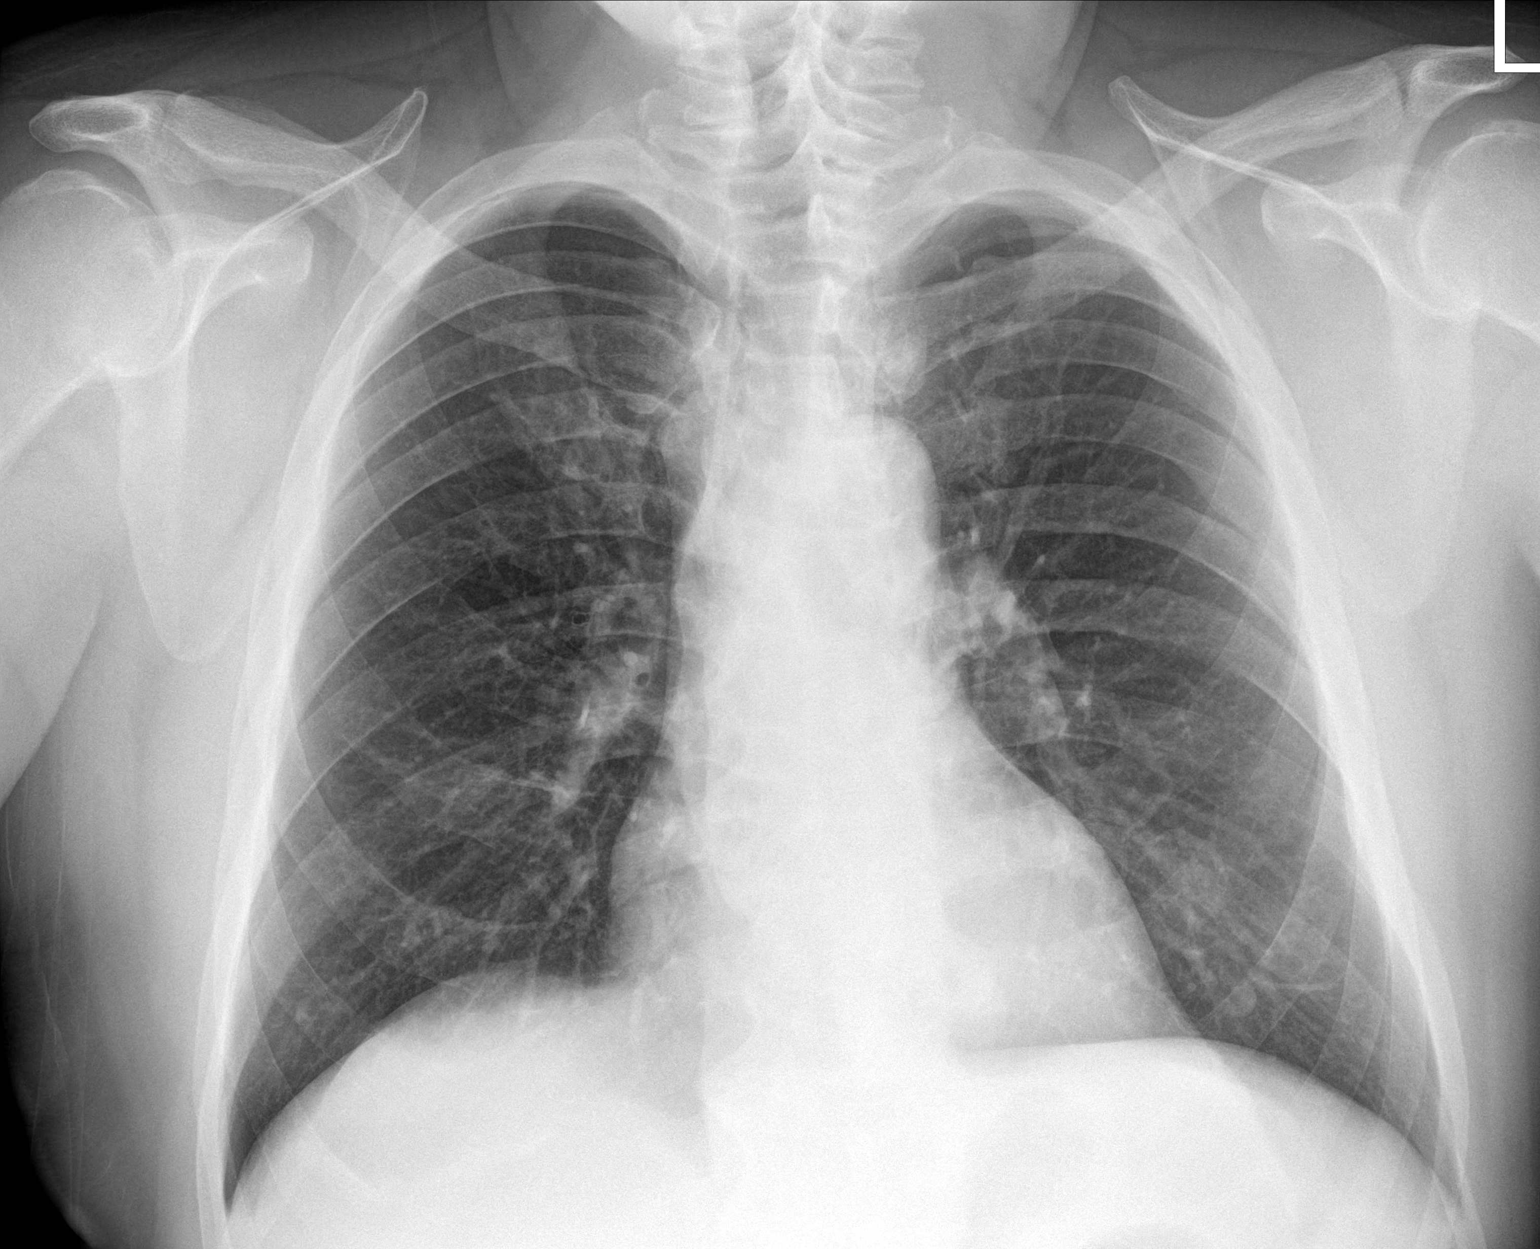

[1 of 1 positions shown; findings below may reference images not displayed]

FINDINGS: The heart size and mediastinal contours are within normal limits.
Both lungs are clear. The visualized skeletal structures are
unremarkable.
IMPRESSION: No active disease.

## 2022-09-19 IMAGING — CR DG LUMBAR SPINE 2-3V
2 series · 2 of 2 positions shown · non-contrast
Comparison: 10/27/2019

CLINICAL DATA: Lumbar decompression.

EXAM:
LUMBAR SPINE - 2-3 VIEW

[lateral (1 of 2)]
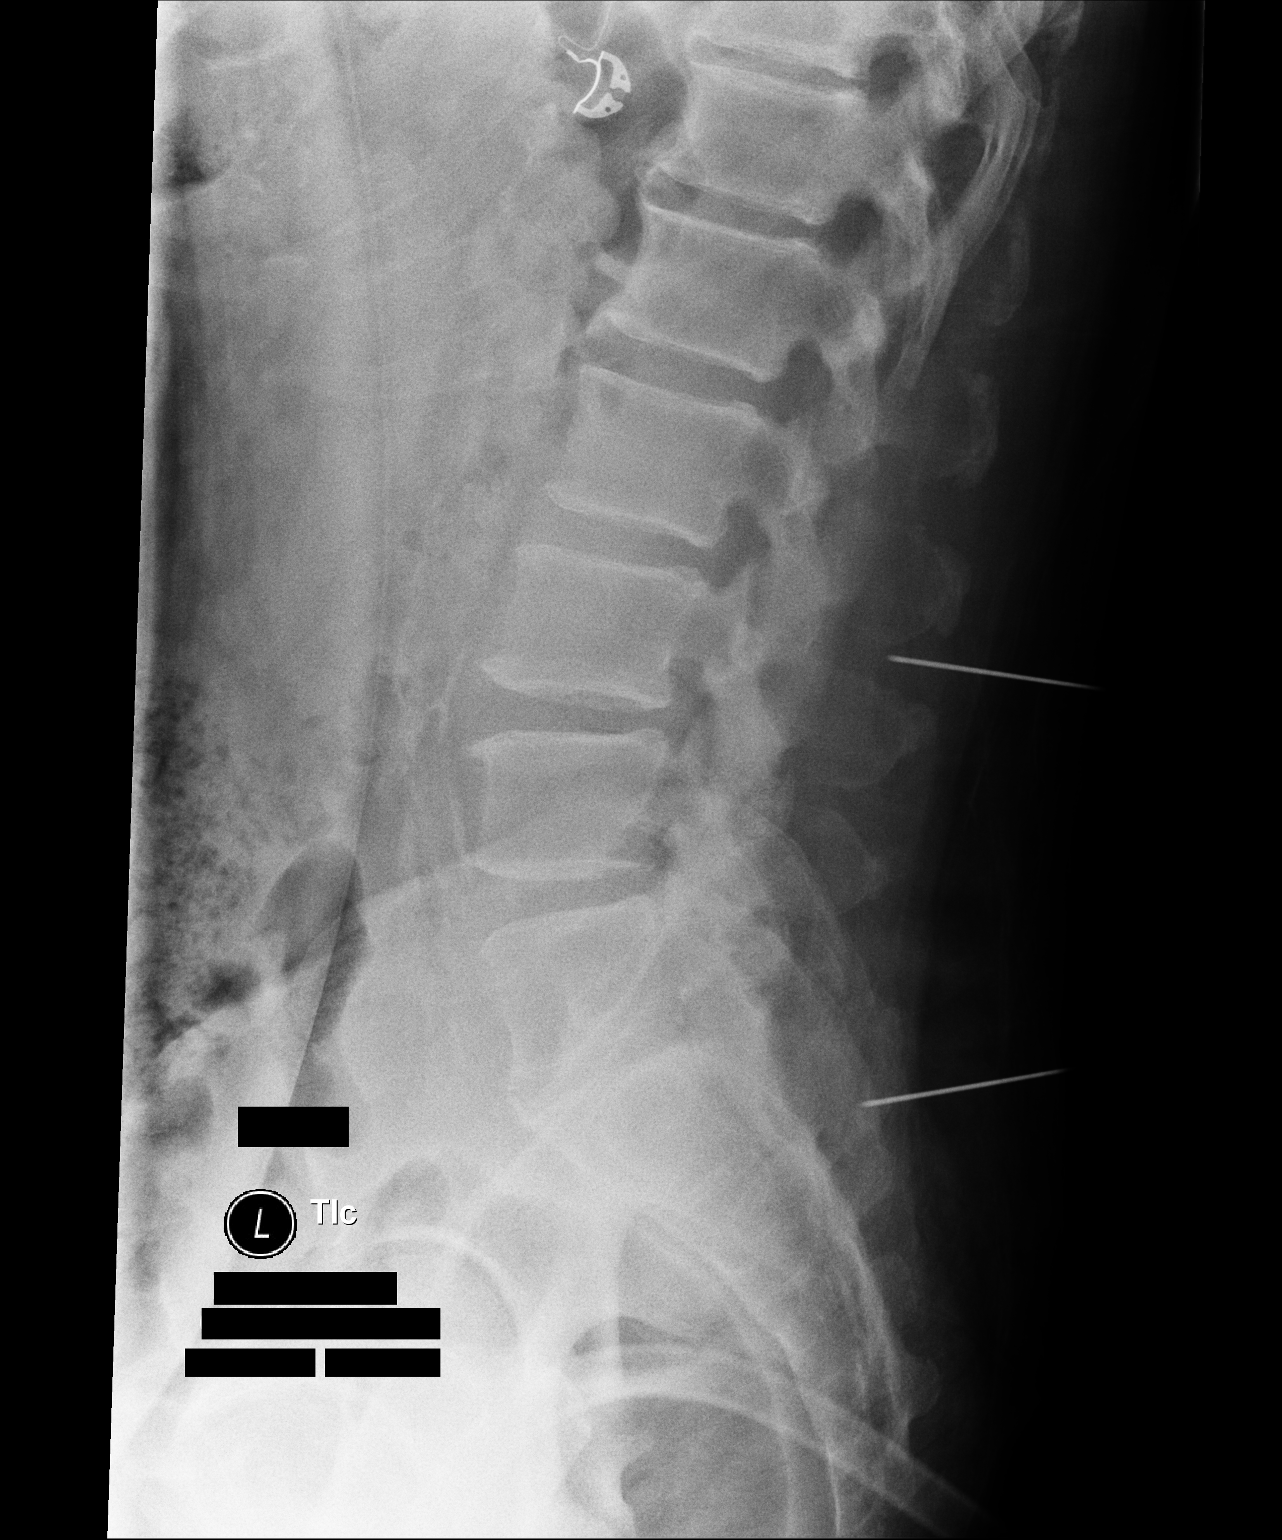

[lateral (2 of 2)]
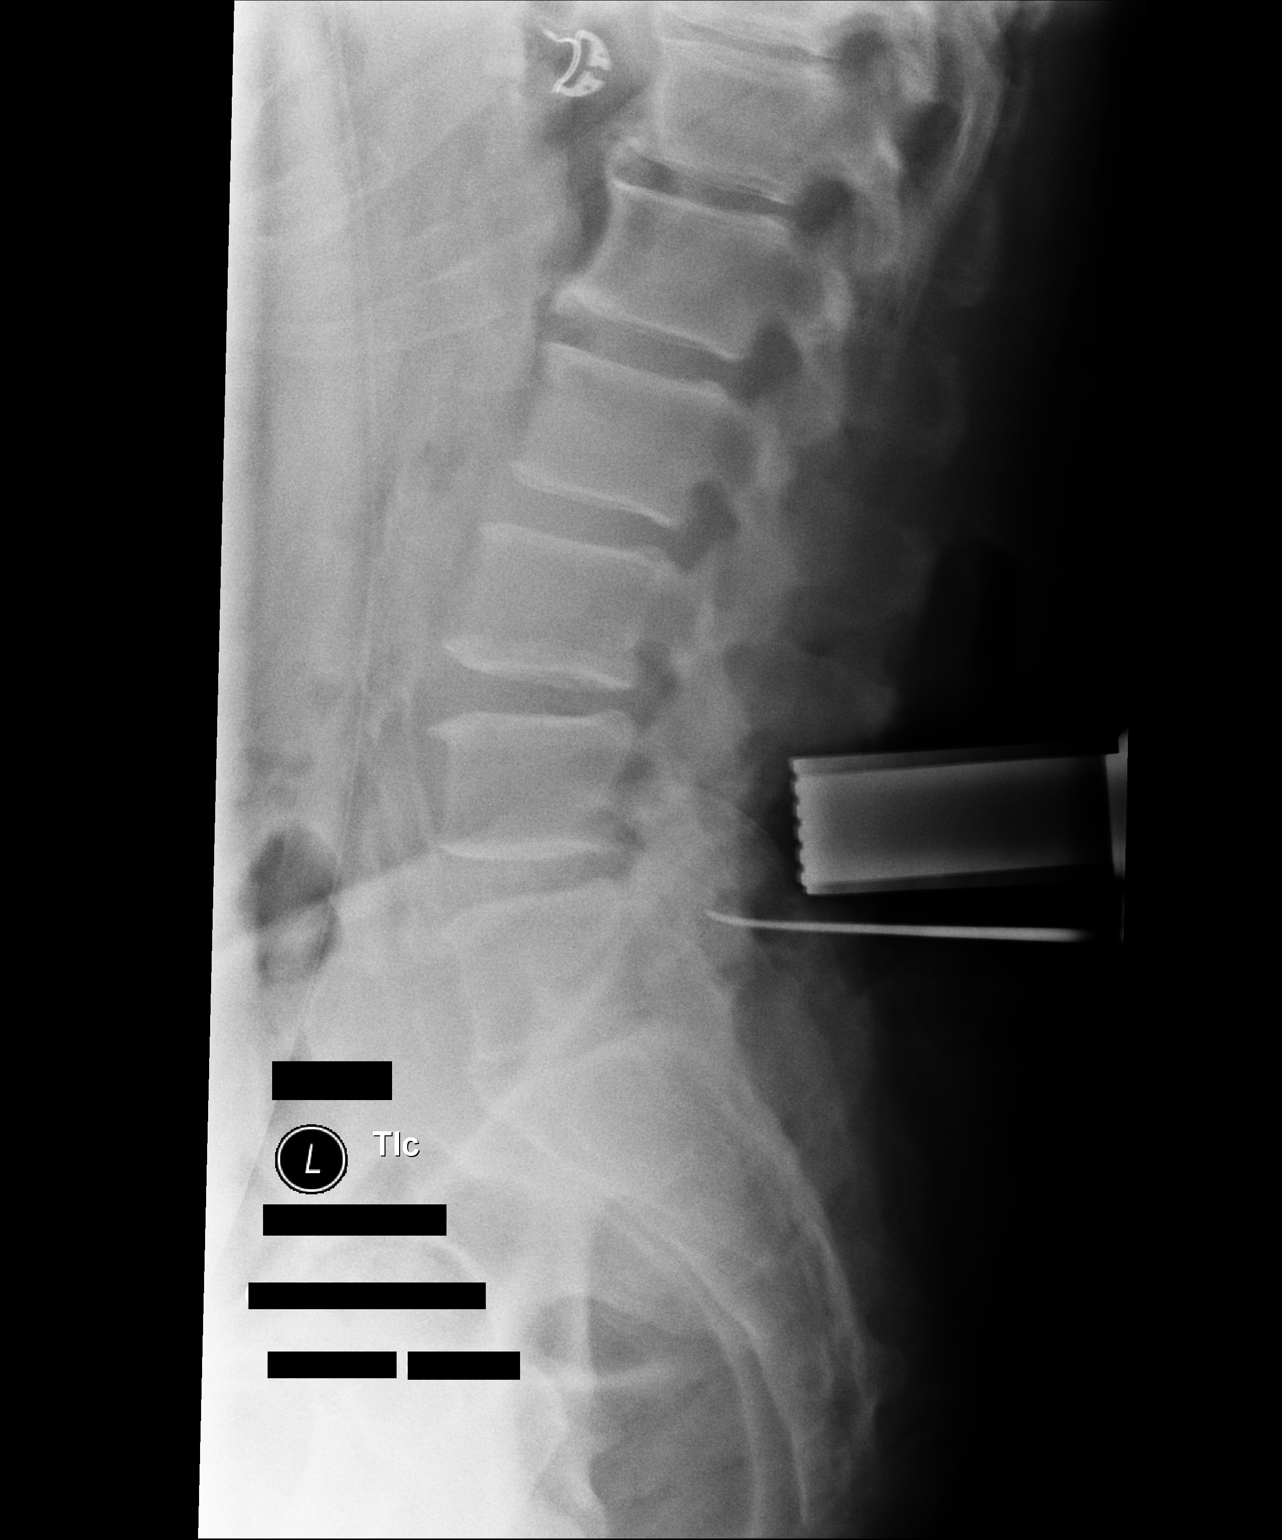

[2 of 2 positions shown; findings below may reference images not displayed]

FINDINGS: Two lateral intraoperative cross-table radiographs of the lumbar
spine are provided. The comparison lumbar spine radiographs
demonstrate transitional lumbosacral anatomy. The transitional
segment is considered a partially sacralized L5 with hypoplastic
L5-S1 disc (with this numbering convention confirmed with Dr.
Sorin).

On the first image, 2 needles are in place with 1 projecting between
the L2 and L3 spinous processes and the other directed towards the
S1 level. On the second image, the tip of a metallic instrument
projects over the posterior elements of the lower lumbar spine
directed towards the L5 superior endplate/inferior aspect of the
L4-5 disc space.
IMPRESSION: Intraoperative images as above.

These results were called by telephone at the time of interpretation
on 11/28/2020 at [DATE] to Dr. Marvely Ajoy, who verbally
acknowledged these results.

## 2022-09-24 IMAGING — US US RENAL
1 series · 14 of 25 positions shown · non-contrast
Comparison: None.

CLINICAL DATA: Acute urinary retention.

EXAM:
RENAL / URINARY TRACT ULTRASOUND COMPLETE

[Series 1: us renal · 14 of 25 slices shown]
[im 1/25]
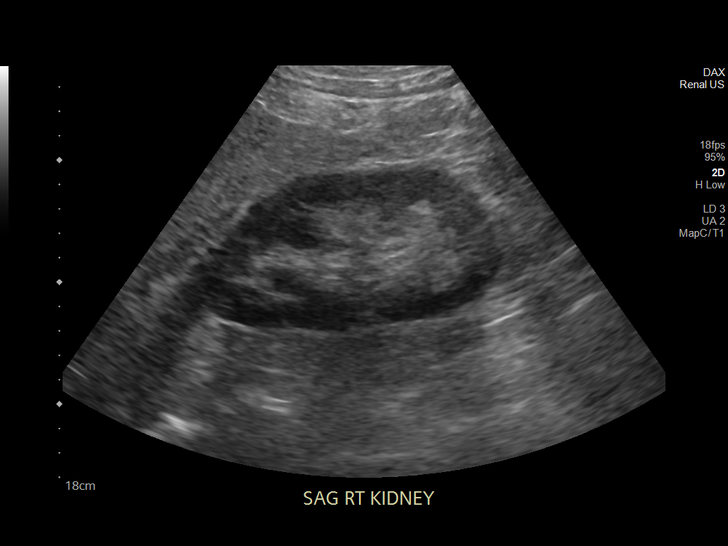
[im 3/25]
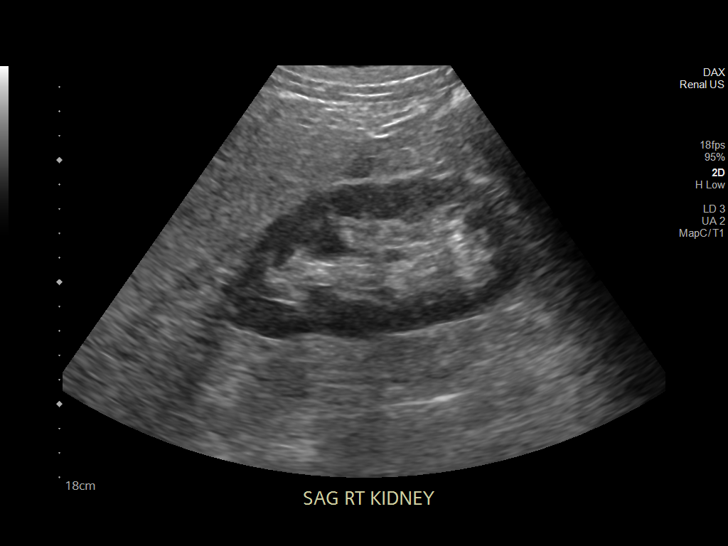
[im 5/25]
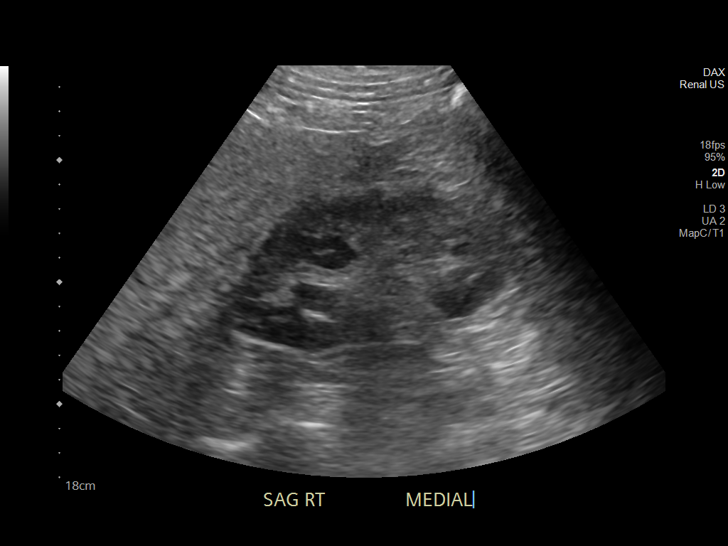
[im 7/25]
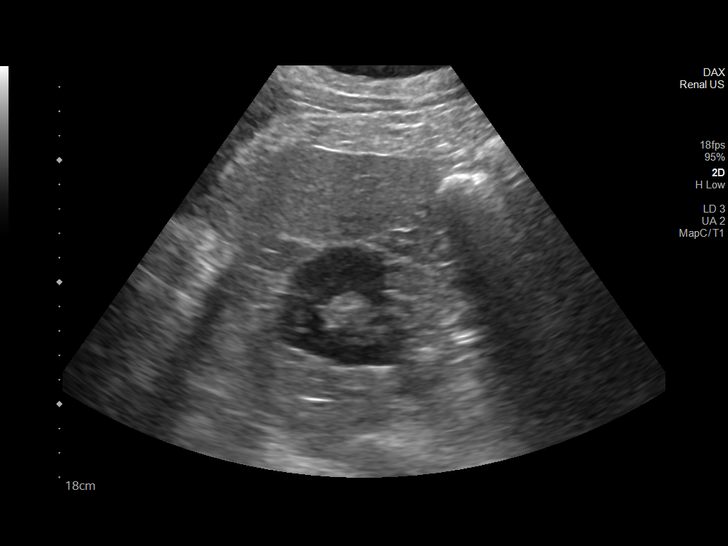
[im 9/25]
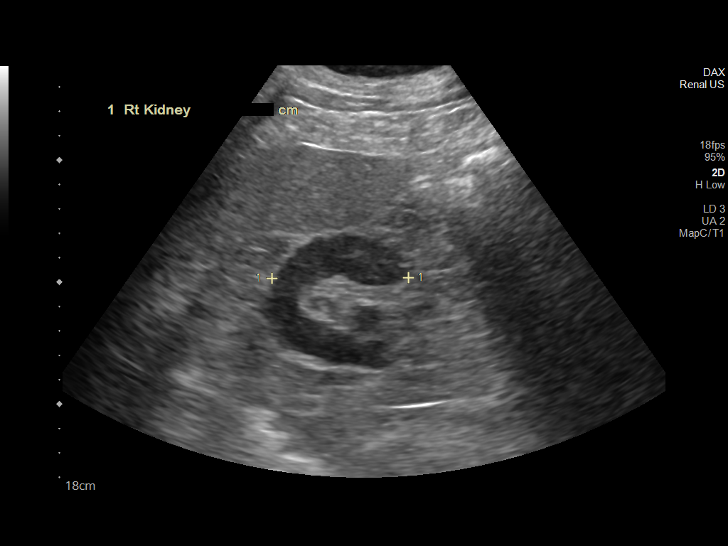
[im 10/25]
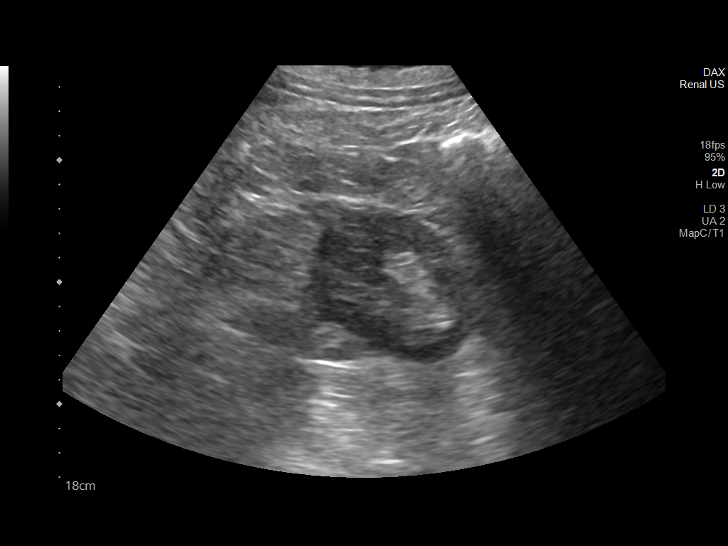
[im 12/25]
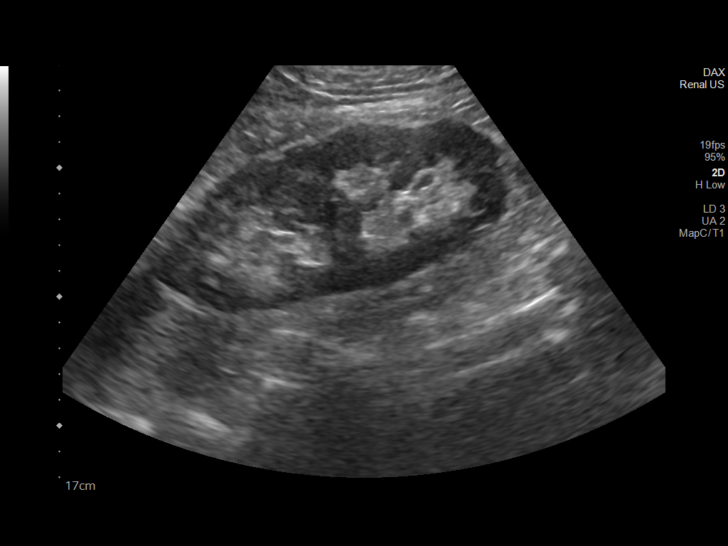
[im 14/25]
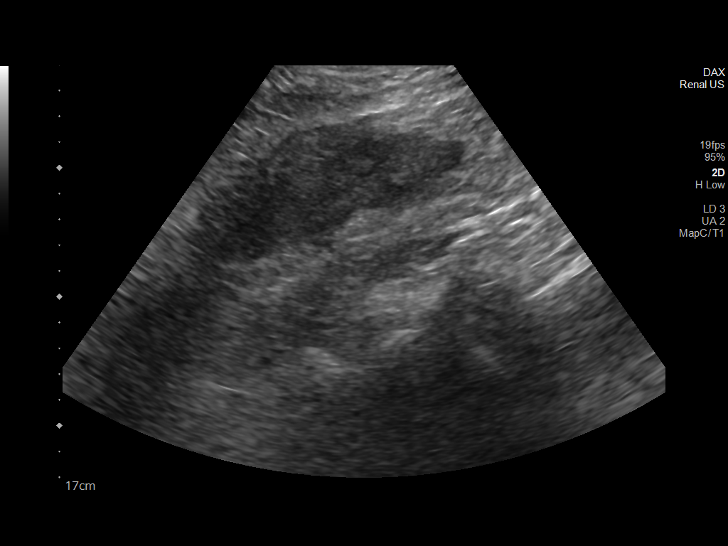
[im 16/25]
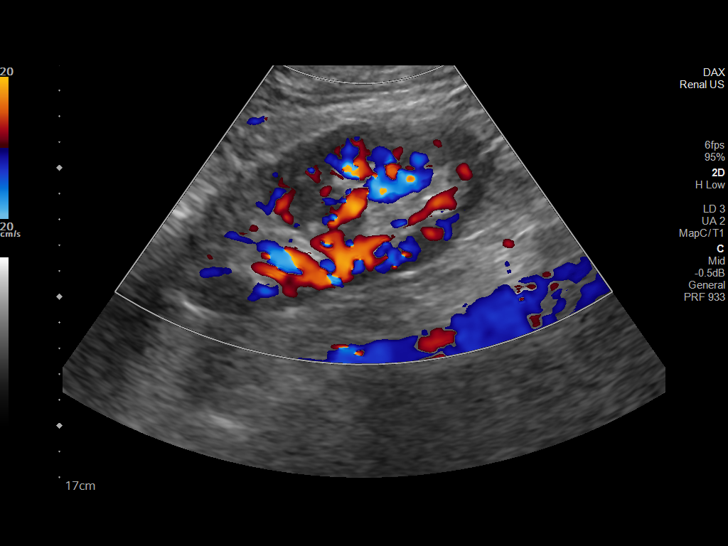
[im 17/25]
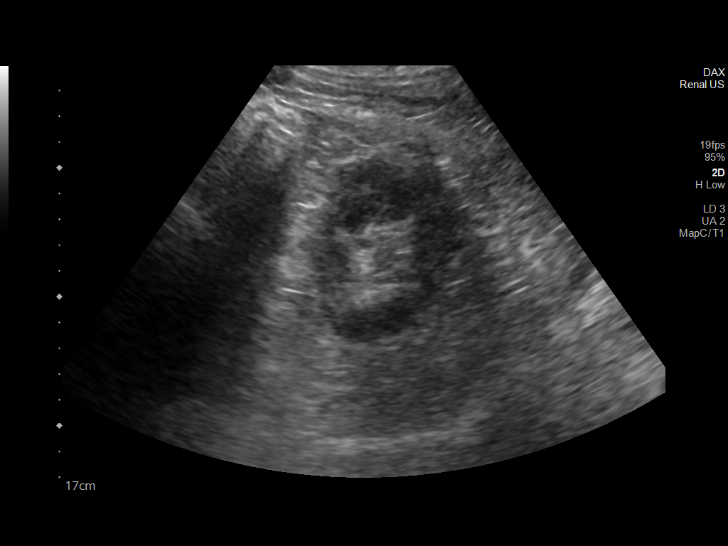
[im 19/25]
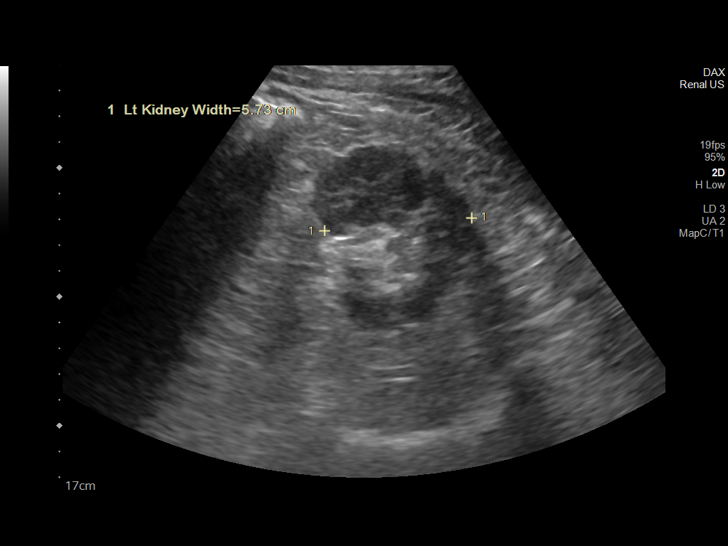
[im 21/25]
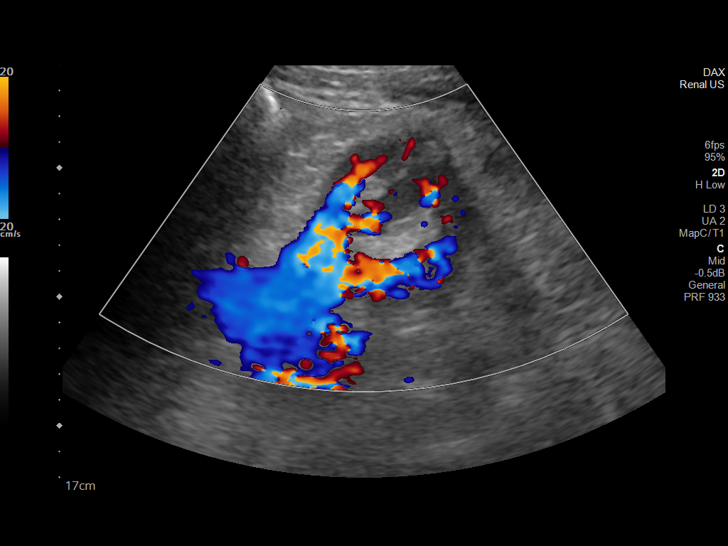
[im 23/25]
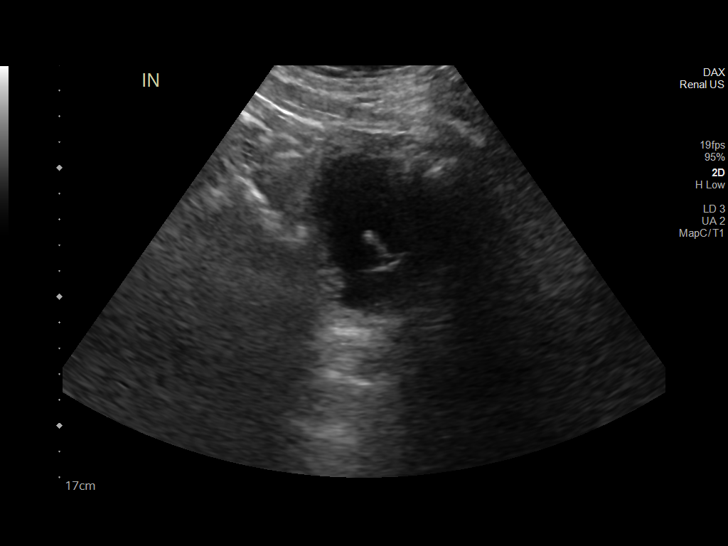
[im 25/25]
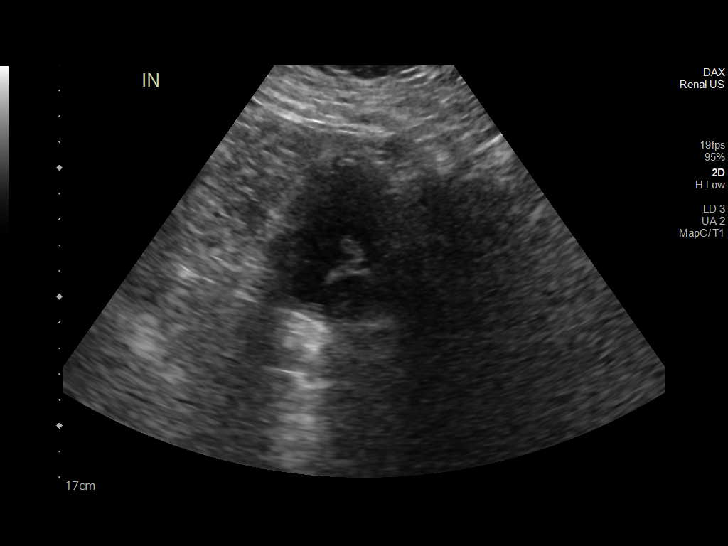

[14 of 25 positions shown; findings below may reference images not displayed]

FINDINGS: Right Kidney:

Renal measurements: 13.4 x 6.6 x 5.6 cm = volume: 259.0 mL.
Echogenicity within normal limits. No mass or hydronephrosis
visualized.

Left Kidney:

Renal measurements: 14.1 x 6.2 x 5.7 cm = volume: 260.6 mL.
Echogenicity within normal limits. No mass or hydronephrosis
visualized.

Bladder:

Almost completely decompressed with a Foley catheter in place.

Other:

None.
IMPRESSION: Normal appearing kidneys.

The urinary bladder is almost completely decompressed with a Foley
catheter in place.

## 2022-11-17 ENCOUNTER — Ambulatory Visit (INDEPENDENT_AMBULATORY_CARE_PROVIDER_SITE_OTHER): Payer: Medicare Other | Admitting: Nurse Practitioner

## 2022-11-17 ENCOUNTER — Encounter: Payer: Self-pay | Admitting: Nurse Practitioner

## 2022-11-17 VITALS — BP 133/70 | HR 75 | Temp 97.5°F | Resp 20 | Ht 70.0 in | Wt 212.0 lb

## 2022-11-17 DIAGNOSIS — Z7984 Long term (current) use of oral hypoglycemic drugs: Secondary | ICD-10-CM | POA: Diagnosis not present

## 2022-11-17 DIAGNOSIS — I25111 Atherosclerotic heart disease of native coronary artery with angina pectoris with documented spasm: Secondary | ICD-10-CM

## 2022-11-17 DIAGNOSIS — I10 Essential (primary) hypertension: Secondary | ICD-10-CM | POA: Diagnosis not present

## 2022-11-17 DIAGNOSIS — Z794 Long term (current) use of insulin: Secondary | ICD-10-CM

## 2022-11-17 DIAGNOSIS — N400 Enlarged prostate without lower urinary tract symptoms: Secondary | ICD-10-CM

## 2022-11-17 DIAGNOSIS — E1169 Type 2 diabetes mellitus with other specified complication: Secondary | ICD-10-CM | POA: Diagnosis not present

## 2022-11-17 DIAGNOSIS — E876 Hypokalemia: Secondary | ICD-10-CM | POA: Diagnosis not present

## 2022-11-17 DIAGNOSIS — E119 Type 2 diabetes mellitus without complications: Secondary | ICD-10-CM

## 2022-11-17 DIAGNOSIS — E1142 Type 2 diabetes mellitus with diabetic polyneuropathy: Secondary | ICD-10-CM

## 2022-11-17 DIAGNOSIS — E785 Hyperlipidemia, unspecified: Secondary | ICD-10-CM

## 2022-11-17 DIAGNOSIS — Z6830 Body mass index (BMI) 30.0-30.9, adult: Secondary | ICD-10-CM

## 2022-11-17 DIAGNOSIS — I2510 Atherosclerotic heart disease of native coronary artery without angina pectoris: Secondary | ICD-10-CM

## 2022-11-17 LAB — BAYER DCA HB A1C WAIVED: HB A1C (BAYER DCA - WAIVED): 6.9 % — ABNORMAL HIGH (ref 4.8–5.6)

## 2022-11-17 MED ORDER — JANUMET 50-1000 MG PO TABS
1.0000 | ORAL_TABLET | Freq: Two times a day (BID) | ORAL | 1 refills | Status: DC
Start: 2022-11-17 — End: 2023-06-28

## 2022-11-17 MED ORDER — ROSUVASTATIN CALCIUM 40 MG PO TABS
20.0000 mg | ORAL_TABLET | Freq: Every day | ORAL | 1 refills | Status: DC
Start: 2022-11-17 — End: 2023-09-13

## 2022-11-17 MED ORDER — AMLODIPINE BESYLATE 5 MG PO TABS: 5.0000 mg | ORAL_TABLET | Freq: Every day | ORAL | 1 refills | Status: DC

## 2022-11-17 MED ORDER — LANTUS SOLOSTAR 100 UNIT/ML ~~LOC~~ SOPN: 55.0000 [IU] | PEN_INJECTOR | Freq: Every day | SUBCUTANEOUS | 3 refills | Status: DC

## 2022-11-17 MED ORDER — LISINOPRIL 20 MG PO TABS: 20.0000 mg | ORAL_TABLET | Freq: Every day | ORAL | 1 refills | Status: DC

## 2022-11-17 MED ORDER — METOPROLOL TARTRATE 25 MG PO TABS: 25.0000 mg | ORAL_TABLET | Freq: Two times a day (BID) | ORAL | 1 refills | Status: DC

## 2022-11-17 MED ORDER — GLIMEPIRIDE 4 MG PO TABS: 4.0000 mg | ORAL_TABLET | Freq: Two times a day (BID) | ORAL | 1 refills | Status: DC

## 2022-11-17 MED ORDER — OMEGA-3-ACID ETHYL ESTERS 1 G PO CAPS: 1.0000 g | ORAL_CAPSULE | Freq: Two times a day (BID) | ORAL | 3 refills | Status: DC

## 2022-11-17 NOTE — Progress Notes (Signed)
Subjective:    Patient ID: Tyler Garner, male    DOB: 05-07-1956, 67 y.o.   MRN: [id number]   Chief Complaint: Medical Management of Chronic Issues    HPI:  Tyler Garner is a 67 y.o. who identifies as a male who was assigned male at birth.   Social history: Lives with: his son lives with him Work history: retired   Water engineer in today for follow up of the following chronic medical issues:  1. Primary hypertension No c/o chest pain, sob or headache. Does not check blood pressure at home. BP Readings from Last 3 Encounters:  11/17/22 133/70  08/18/22 136/78  05/19/22 139/76    2. Hyperlipidemia associated with type 2 diabetes mellitus (HCC) Does try to watch diet but does no dedicated exercise. Lab Results  Component Value Date   CHOL 126 08/18/2022   HDL 26 (L) 08/18/2022   LDLCALC 62 08/18/2022   TRIG 232 (H) 08/18/2022   CHOLHDL 4.8 08/18/2022     3. Type 2 diabetes mellitus with diabetic polyneuropathy, with long-term current use of insulin (HCC) 4. Diabetes mellitus treated with insulin and oral medication (HCC) Fasting blood sugars are running around 110-150. No low blood sugars. Has numbness in all toes. Lab Results  Component Value Date   HGBA1C 7.2 (H) 08/18/2022     5. Hypokalemia No muscle cramps Lab Results  Component Value Date   K 5.3 (H) 08/18/2022     6. Benign prostatic hyperplasia without lower urinary tract symptoms No voiding issues Lab Results  Component Value Date   PSA1 0.5 05/19/2022   PSA1 0.9 02/18/2021   PSA1 0.7 11/16/2019   PSA 0.68 12/05/2012      7. Morbid obesity (HCC) Weight down 3 lbs Wt Readings from Last 3 Encounters:  11/17/22 212 lb (96.2 kg)  08/18/22 215 lb (97.5 kg)  05/19/22 217 lb (98.4 kg)   BMI Readings from Last 3 Encounters:  11/17/22 30.42 kg/m  08/18/22 30.85 kg/m  05/19/22 31.14 kg/m      New complaints: None today  Allergies  Allergen Reactions   Percocet  [Oxycodone-Acetaminophen] Nausea And Vomiting   Outpatient Encounter Medications as of 11/17/2022  Medication Sig   Accu-Chek Softclix Lancets lancets Check blood sugar daily and prn  Dx E11.9   amLODipine (NORVASC) 5 MG tablet Take 1 tablet (5 mg total) by mouth daily.   Blood Glucose Monitoring Suppl (ACCU-CHEK GUIDE ME) w/Device KIT CHECK BLOOD SUGAR DAILY AND AS NEEDED DX E11.9   celecoxib (CELEBREX) 200 MG capsule Take 1 capsule (200 mg total) by mouth 2 (two) times daily.   glimepiride (AMARYL) 4 MG tablet Take 1 tablet (4 mg total) by mouth 2 (two) times daily.   glucose blood (ACCU-CHEK GUIDE) test strip CHECK BLOOD SUGAR DAILY AND AS NEEDED DX E11.9   insulin glargine (LANTUS SOLOSTAR) 100 UNIT/ML Solostar Pen Inject 55 Units into the skin at bedtime.   Insulin Pen Needle (PEN NEEDLES) 31G X 8 MM MISC Use with lantus solosar pen daily   JANUMET 50-1000 MG tablet Take 1 tablet by mouth 2 (two) times daily.   lisinopril (ZESTRIL) 20 MG tablet Take 1 tablet (20 mg total) by mouth daily.   metoprolol tartrate (LOPRESSOR) 25 MG tablet Take 1 tablet (25 mg total) by mouth 2 (two) times daily.   omega-3 acid ethyl esters (LOVAZA) 1 g capsule Take 1 capsule (1 g total) by mouth 2 (two) times daily.   ondansetron (ZOFRAN) 4  MG tablet Take 1 tablet (4 mg total) by mouth daily as needed for up to 30 doses for nausea or vomiting.   rosuvastatin (CRESTOR) 40 MG tablet Take 0.5 tablets (20 mg total) by mouth daily.   No facility-administered encounter medications on file as of 11/17/2022.    Past Surgical History:  Procedure Laterality Date   heart stent  06/21/2013   x 1 to distal LCX   LEFT HEART CATHETERIZATION WITH CORONARY ANGIOGRAM N/A 06/21/2013   Procedure: LEFT HEART CATHETERIZATION WITH CORONARY ANGIOGRAM;  Surgeon: Peter M Swaziland, MD;  Location: [facility] CATH LAB;  Service: Cardiovascular;  Laterality: N/A;   LUMBAR LAMINECTOMY/DECOMPRESSION MICRODISCECTOMY N/A 11/28/2020   Procedure:  LUMBAR DECOMPRESSION  2 LEVELS, L3 and L5;  Surgeon: Venita Lick, MD;  Location: [facility] OR;  Service: Orthopedics;  Laterality: N/A;  3.5 hrs   TRANSURETHRAL RESECTION OF PROSTATE N/A 03/28/2021   Procedure: TRANSURETHRAL RESECTION OF THE PROSTATE (TURP)/ BIPOLAR;  Surgeon: Jannifer Hick, MD;  Location: [facility];  Service: Urology;  Laterality: N/A;    Family History  Problem Relation Age of Onset   Diabetes Mother    ALS Mother    Alzheimer's disease Father    Healthy Sister    Diabetes Brother       Controlled substance contract: n/a     Review of Systems  Constitutional:  Negative for diaphoresis.  Eyes:  Negative for pain.  Respiratory:  Negative for shortness of breath.   Cardiovascular:  Negative for chest pain, palpitations and leg swelling.  Gastrointestinal:  Negative for abdominal pain.  Endocrine: Negative for polydipsia.  Skin:  Negative for rash.  Neurological:  Negative for dizziness, weakness and headaches.  Hematological:  Does not bruise/bleed easily.  All other systems reviewed and are negative.      Objective:   Physical Exam Vitals and nursing note reviewed.  Constitutional:      Appearance: Normal appearance. He is well-developed.  HENT:     Head: Normocephalic.     Nose: Nose normal.     Mouth/Throat:     Mouth: Mucous membranes are moist.     Pharynx: Oropharynx is clear.  Eyes:     Pupils: Pupils are equal, round, and reactive to light.  Neck:     Thyroid: No thyroid mass or thyromegaly.     Vascular: No carotid bruit or JVD.     Trachea: Phonation normal.  Cardiovascular:     Rate and Rhythm: Normal rate and regular rhythm.  Pulmonary:     Effort: Pulmonary effort is normal. No respiratory distress.     Breath sounds: Normal breath sounds.  Abdominal:     General: Bowel sounds are normal.     Palpations: Abdomen is soft.     Tenderness: There is no abdominal tenderness.  Musculoskeletal:        General: Normal  range of motion.     Cervical back: Normal range of motion and neck supple.     Comments: [employer] with a cane  Lymphadenopathy:     Cervical: No cervical adenopathy.  Skin:    General: Skin is warm and dry.  Neurological:     Mental Status: He is alert and oriented to person, place, and time.  Psychiatric:        Behavior: Behavior normal.        Thought Content: Thought content normal.        Judgment: Judgment normal.    BP 133/70  Pulse 75   Temp (!) 97.5 F (36.4 C) (Temporal)   Resp 20   Ht 5\' 10"  (1.778 m)   Wt 212 lb (96.2 kg)   SpO2 98%   BMI 30.42 kg/m   HBA1c 6.9%       Assessment & Plan:   RAWLEIGH RODE comes in today with chief complaint of Medical Management of Chronic Issues   Diagnosis and orders addressed:  1. Primary hypertension Low sodium diet - CBC with Differential/Platelet - CMP14+EGFR - amLODipine (NORVASC) 5 MG tablet; Take 1 tablet (5 mg total) by mouth daily.  Dispense: 90 tablet; Refill: 1 - lisinopril (ZESTRIL) 20 MG tablet; Take 1 tablet (20 mg total) by mouth daily.  Dispense: 90 tablet; Refill: 1  2. Hyperlipidemia associated with type 2 diabetes mellitus (HCC) Low fat diet - Lipid panel - rosuvastatin (CRESTOR) 40 MG tablet; Take 0.5 tablets (20 mg total) by mouth daily.  Dispense: 135 tablet; Refill: 1  3. Type 2 diabetes mellitus with diabetic polyneuropathy, with long-term current use of insulin (HCC) Continue to watch carbs in diet - Bayer DCA Hb A1c Waived - glimepiride (AMARYL) 4 MG tablet; Take 1 tablet (4 mg total) by mouth 2 (two) times daily.  Dispense: 180 tablet; Refill: 1 - insulin glargine (LANTUS SOLOSTAR) 100 UNIT/ML Solostar Pen; Inject 55 Units into the skin at bedtime.  Dispense: 30 mL; Refill: 3 - JANUMET 50-1000 MG tablet; Take 1 tablet by mouth 2 (two) times daily.  Dispense: 180 tablet; Refill: 1  4. Diabetes mellitus treated with insulin and oral medication (HCC)  5. Hypokalemia Labs pending  6.  Benign prostatic hyperplasia without lower urinary tract symptoms Report any voiding issues  7. BMI 30.0-30.9,adult Discussed diet and exercise for person with BMI >25 Will recheck weight in 3-6 months   8. Atherosclerosis of native coronary artery of native heart with angina pectoris with documented spasm (HCC) - metoprolol tartrate (LOPRESSOR) 25 MG tablet; Take 1 tablet (25 mg total) by mouth 2 (two) times daily.  Dispense: 180 tablet; Refill: 1  9. Atherosclerosis of native coronary artery of native heart without angina pectoris  - omega-3 acid ethyl esters (LOVAZA) 1 g capsule; Take 1 capsule (1 g total) by mouth 2 (two) times daily.  Dispense: 180 capsule; Refill: 3   Labs pending Health Maintenance reviewed Diet and exercise encouraged  Follow up plan: 6 months   [name] Daphine Deutscher, [name]

## 2022-11-17 NOTE — Patient Instructions (Signed)

## 2022-11-18 ENCOUNTER — Other Ambulatory Visit: Payer: Self-pay

## 2022-11-18 DIAGNOSIS — E875 Hyperkalemia: Secondary | ICD-10-CM

## 2022-11-18 LAB — CMP14+EGFR
ALT: 9 IU/L (ref 0–44)
AST: 14 IU/L (ref 0–40)
Albumin: 4.5 g/dL (ref 3.9–4.9)
Alkaline Phosphatase: 63 IU/L (ref 44–121)
BUN/Creatinine Ratio: 23 (ref 10–24)
BUN: 20 mg/dL (ref 8–27)
Bilirubin Total: 0.2 mg/dL (ref 0.0–1.2)
CO2: 23 mmol/L (ref 20–29)
Calcium: 7.3 mg/dL — ABNORMAL LOW (ref 8.6–10.2)
Chloride: 106 mmol/L (ref 96–106)
Creatinine, Ser: 0.88 mg/dL (ref 0.76–1.27)
Globulin, Total: 2.5 g/dL (ref 1.5–4.5)
Glucose: 117 mg/dL — ABNORMAL HIGH (ref 70–99)
Potassium: 5.9 mmol/L (ref 3.5–5.2)
Sodium: 144 mmol/L (ref 134–144)
Total Protein: 7 g/dL (ref 6.0–8.5)
eGFR: 94 mL/min/{1.73_m2} (ref >59)

## 2022-11-18 LAB — CBC WITH DIFFERENTIAL/PLATELET
Basophils Absolute: 0.1 10*3/uL (ref 0.0–0.2)
Basos: 2 %
EOS (ABSOLUTE): 0.2 10*3/uL (ref 0.0–0.4)
Eos: 2 %
Hematocrit: 34.8 % — ABNORMAL LOW (ref 37.5–51.0)
Hemoglobin: 10.4 g/dL — ABNORMAL LOW (ref 13.0–17.7)
Immature Grans (Abs): 0 10*3/uL (ref 0.0–0.1)
Immature Granulocytes: 0 %
Lymphocytes Absolute: 2.7 10*3/uL (ref 0.7–3.1)
Lymphs: 35 %
MCH: 21.1 pg — ABNORMAL LOW (ref 26.6–33.0)
MCHC: 29.9 g/dL — ABNORMAL LOW (ref 31.5–35.7)
MCV: 71 fL — ABNORMAL LOW (ref 79–97)
Monocytes Absolute: 0.8 10*3/uL (ref 0.1–0.9)
Monocytes: 10 %
Neutrophils Absolute: 4 10*3/uL (ref 1.4–7.0)
Neutrophils: 51 %
Platelets: 406 10*3/uL (ref 150–450)
RBC: 4.93 x10E6/uL (ref 4.14–5.80)
RDW: 14.8 % (ref 11.6–15.4)
WBC: 7.7 10*3/uL (ref 3.4–10.8)

## 2022-11-18 LAB — LIPID PANEL
Chol/HDL Ratio: 4.7 ratio (ref 0.0–5.0)
Cholesterol, Total: 123 mg/dL (ref 100–199)
HDL: 26 mg/dL — ABNORMAL LOW (ref >39)
LDL Chol Calc (NIH): 69 mg/dL (ref 0–99)
Triglycerides: 164 mg/dL — ABNORMAL HIGH (ref 0–149)
VLDL Cholesterol Cal: 28 mg/dL (ref 5–40)

## 2022-11-19 ENCOUNTER — Other Ambulatory Visit: Payer: Medicare Other

## 2022-11-19 DIAGNOSIS — E875 Hyperkalemia: Secondary | ICD-10-CM | POA: Diagnosis not present

## 2022-11-19 LAB — CMP14+EGFR
ALT: 8 IU/L (ref 0–44)
AST: 12 IU/L (ref 0–40)
Albumin: 4.5 g/dL (ref 3.9–4.9)
Alkaline Phosphatase: 62 IU/L (ref 44–121)
BUN/Creatinine Ratio: 21 (ref 10–24)
BUN: 17 mg/dL (ref 8–27)
Bilirubin Total: 0.3 mg/dL (ref 0.0–1.2)
CO2: 22 mmol/L (ref 20–29)
Calcium: 9.6 mg/dL (ref 8.6–10.2)
Chloride: 104 mmol/L (ref 96–106)
Creatinine, Ser: 0.81 mg/dL (ref 0.76–1.27)
Globulin, Total: 2.5 g/dL (ref 1.5–4.5)
Glucose: 161 mg/dL — ABNORMAL HIGH (ref 70–99)
Potassium: 5.4 mmol/L — ABNORMAL HIGH (ref 3.5–5.2)
Sodium: 144 mmol/L (ref 134–144)
Total Protein: 7 g/dL (ref 6.0–8.5)
eGFR: 96 mL/min/{1.73_m2} (ref >59)

## 2022-11-22 ENCOUNTER — Other Ambulatory Visit: Payer: Self-pay | Admitting: Nurse Practitioner

## 2022-12-15 ENCOUNTER — Other Ambulatory Visit: Payer: Self-pay | Admitting: Nurse Practitioner

## 2022-12-15 DIAGNOSIS — M545 Low back pain, unspecified: Secondary | ICD-10-CM

## 2023-02-15 ENCOUNTER — Other Ambulatory Visit: Payer: Self-pay | Admitting: Nurse Practitioner

## 2023-02-15 DIAGNOSIS — M545 Other chronic pain: Secondary | ICD-10-CM

## 2023-04-13 ENCOUNTER — Telehealth: Payer: Self-pay

## 2023-04-13 NOTE — Patient Outreach (Signed)
Attempted to contact patient regarding DM eye, Colorectal, Lung cancer screening, AWV. Left voicemail for patient to return my call at [phone].  Nicholes Rough, CMA Care Guide [employer]

## 2023-05-14 ENCOUNTER — Other Ambulatory Visit: Payer: Self-pay | Admitting: Nurse Practitioner

## 2023-05-14 DIAGNOSIS — I10 Essential (primary) hypertension: Secondary | ICD-10-CM

## 2023-05-17 ENCOUNTER — Ambulatory Visit: Payer: Medicare Other | Admitting: Nurse Practitioner

## 2023-05-17 ENCOUNTER — Encounter: Payer: Self-pay | Admitting: Nurse Practitioner

## 2023-05-17 VITALS — BP 143/66 | HR 74 | Temp 97.2°F | Ht 70.0 in | Wt 216.0 lb

## 2023-05-17 DIAGNOSIS — I240 Acute coronary thrombosis not resulting in myocardial infarction: Secondary | ICD-10-CM

## 2023-05-17 DIAGNOSIS — Z794 Long term (current) use of insulin: Secondary | ICD-10-CM

## 2023-05-17 DIAGNOSIS — Z7984 Long term (current) use of oral hypoglycemic drugs: Secondary | ICD-10-CM | POA: Diagnosis not present

## 2023-05-17 DIAGNOSIS — E785 Hyperlipidemia, unspecified: Secondary | ICD-10-CM | POA: Diagnosis not present

## 2023-05-17 DIAGNOSIS — I10 Essential (primary) hypertension: Secondary | ICD-10-CM

## 2023-05-17 DIAGNOSIS — E1169 Type 2 diabetes mellitus with other specified complication: Secondary | ICD-10-CM

## 2023-05-17 DIAGNOSIS — E876 Hypokalemia: Secondary | ICD-10-CM

## 2023-05-17 DIAGNOSIS — I259 Chronic ischemic heart disease, unspecified: Secondary | ICD-10-CM

## 2023-05-17 DIAGNOSIS — E1142 Type 2 diabetes mellitus with diabetic polyneuropathy: Secondary | ICD-10-CM | POA: Diagnosis not present

## 2023-05-17 DIAGNOSIS — R0981 Nasal congestion: Secondary | ICD-10-CM | POA: Diagnosis not present

## 2023-05-17 DIAGNOSIS — N400 Enlarged prostate without lower urinary tract symptoms: Secondary | ICD-10-CM

## 2023-05-17 LAB — BAYER DCA HB A1C WAIVED: HB A1C (BAYER DCA - WAIVED): 7.4 % — ABNORMAL HIGH (ref 4.8–5.6)

## 2023-05-17 MED ORDER — FLUTICASONE PROPIONATE 50 MCG/ACT NA SUSP: 2.0000 | Freq: Every day | NASAL | 6 refills | Status: DC

## 2023-05-17 NOTE — Progress Notes (Signed)
Subjective:    Patient ID: Tyler Garner, male    DOB: 1955-09-28, 67 y.o.   MRN: [id number]   Chief Complaint: medical management of chronic issues     HPI:  Tyler Garner is a 67 y.o. who identifies as a male who was assigned male at birth.   Social history: Lives with: his son lives with him Work history: retired   Water engineer in today for follow up of the following chronic medical issues:  1. Primary hypertension No c/o chest pain, sob or headache. Does not check blood pressure at home. BP Readings from Last 3 Encounters:  11/17/22 133/70  08/18/22 136/78  05/19/22 139/76    2. Hyperlipidemia associated with type 2 diabetes mellitus (HCC) Does try to watch diet but does no dedicated exercise. Lab Results  Component Value Date   CHOL 123 11/17/2022   HDL 26 (L) 11/17/2022   LDLCALC 69 11/17/2022   TRIG 164 (H) 11/17/2022   CHOLHDL 4.7 11/17/2022   The ASCVD Risk score (Arnett DK, et al., 2019) failed to calculate for the following reasons:   Risk score cannot be calculated because patient has a medical history suggesting prior/existing ASCVD   3. Type 2 diabetes mellitus with diabetic polyneuropathy, with long-term current use of insulin (HCC) 4. Diabetes mellitus treated with insulin and oral medication (HCC) Fasting blood sugars are running around 110-150. No low blood sugars. Has numbness in all toes. Lab Results  Component Value Date   HGBA1C 6.9 (H) 11/17/2022     5. Hypokalemia No muscle cramps Lab Results  Component Value Date   K 5.4 (H) 11/19/2022     6. Benign prostatic hyperplasia without lower urinary tract symptoms No voiding issues Lab Results  Component Value Date   PSA1 0.5 05/19/2022   PSA1 0.9 02/18/2021   PSA1 0.7 11/16/2019   PSA 0.68 12/05/2012      7. Morbid obesity (HCC) Weight down 3 lbs Wt Readings from Last 3 Encounters:  11/17/22 212 lb (96.2 kg)  08/18/22 215 lb (97.5 kg)  05/19/22 217 lb (98.4 kg)   BMI  Readings from Last 3 Encounters:  11/17/22 30.42 kg/m  08/18/22 30.85 kg/m  05/19/22 31.14 kg/m      New complaints: Arthritis is getting bad but he does not want to see speciaist as of yet.  Allergies  Allergen Reactions   Percocet [Oxycodone-Acetaminophen] Nausea And Vomiting   Outpatient Encounter Medications as of 05/17/2023  Medication Sig   ACCU-CHEK GUIDE test strip CHECK BLOOD SUGAR DAILY AND AS NEEDED DX E11.9   Accu-Chek Softclix Lancets lancets Check blood sugar daily and prn  Dx E11.9   amLODipine (NORVASC) 5 MG tablet TAKE 1 TABLET (5 MG TOTAL) BY MOUTH DAILY.   Blood Glucose Monitoring Suppl (ACCU-CHEK GUIDE ME) w/Device KIT CHECK BLOOD SUGAR DAILY AND AS NEEDED DX E11.9   celecoxib (CELEBREX) 200 MG capsule TAKE 1 CAPSULE BY MOUTH TWICE A DAY   glimepiride (AMARYL) 4 MG tablet Take 1 tablet (4 mg total) by mouth 2 (two) times daily.   insulin glargine (LANTUS SOLOSTAR) 100 UNIT/ML Solostar Pen Inject 55 Units into the skin at bedtime.   Insulin Pen Needle (PEN NEEDLES) 31G X 8 MM MISC Use with lantus solosar pen daily   JANUMET 50-1000 MG tablet Take 1 tablet by mouth 2 (two) times daily.   lisinopril (ZESTRIL) 20 MG tablet Take 1 tablet (20 mg total) by mouth daily.   metoprolol tartrate (LOPRESSOR) 25 MG  tablet Take 1 tablet (25 mg total) by mouth 2 (two) times daily.   omega-3 acid ethyl esters (LOVAZA) 1 g capsule Take 1 capsule (1 g total) by mouth 2 (two) times daily.   ondansetron (ZOFRAN) 4 MG tablet Take 1 tablet (4 mg total) by mouth daily as needed for up to 30 doses for nausea or vomiting.   rosuvastatin (CRESTOR) 40 MG tablet Take 0.5 tablets (20 mg total) by mouth daily.   No facility-administered encounter medications on file as of 05/17/2023.    Past Surgical History:  Procedure Laterality Date   heart stent  06/21/2013   x 1 to distal LCX   LEFT HEART CATHETERIZATION WITH CORONARY ANGIOGRAM N/A 06/21/2013   Procedure: LEFT HEART  CATHETERIZATION WITH CORONARY ANGIOGRAM;  Surgeon: Peter M Swaziland, MD;  Location: [facility] CATH LAB;  Service: Cardiovascular;  Laterality: N/A;   LUMBAR LAMINECTOMY/DECOMPRESSION MICRODISCECTOMY N/A 11/28/2020   Procedure: LUMBAR DECOMPRESSION  2 LEVELS, L3 and L5;  Surgeon: Venita Lick, MD;  Location: [facility] OR;  Service: Orthopedics;  Laterality: N/A;  3.5 hrs   TRANSURETHRAL RESECTION OF PROSTATE N/A 03/28/2021   Procedure: TRANSURETHRAL RESECTION OF THE PROSTATE (TURP)/ BIPOLAR;  Surgeon: Jannifer Hick, MD;  Location: [facility];  Service: Urology;  Laterality: N/A;    Family History  Problem Relation Age of Onset   Diabetes Mother    ALS Mother    Alzheimer's disease Father    Healthy Sister    Diabetes Brother       Controlled substance contract: n/a     Review of Systems  Constitutional:  Negative for diaphoresis.  Eyes:  Negative for pain.  Respiratory:  Negative for shortness of breath.   Cardiovascular:  Negative for chest pain, palpitations and leg swelling.  Gastrointestinal:  Negative for abdominal pain.  Endocrine: Negative for polydipsia.  Skin:  Negative for rash.  Neurological:  Negative for dizziness, weakness and headaches.  Hematological:  Does not bruise/bleed easily.  All other systems reviewed and are negative.      Objective:   Physical Exam Vitals and nursing note reviewed.  Constitutional:      Appearance: Normal appearance. He is well-developed.  HENT:     Head: Normocephalic.     Nose: Nose normal.     Mouth/Throat:     Mouth: Mucous membranes are moist.     Pharynx: Oropharynx is clear.  Eyes:     Pupils: Pupils are equal, round, and reactive to light.  Neck:     Thyroid: No thyroid mass or thyromegaly.     Vascular: No carotid bruit or JVD.     Trachea: Phonation normal.  Cardiovascular:     Rate and Rhythm: Normal rate and regular rhythm.  Pulmonary:     Effort: Pulmonary effort is normal. No respiratory distress.      Breath sounds: Normal breath sounds.  Abdominal:     General: Bowel sounds are normal.     Palpations: Abdomen is soft.     Tenderness: There is no abdominal tenderness.  Musculoskeletal:        General: Normal range of motion.     Cervical back: Normal range of motion and neck supple.     Comments: [employer] with a cane  Lymphadenopathy:     Cervical: No cervical adenopathy.  Skin:    General: Skin is warm and dry.  Neurological:     Mental Status: He is alert and oriented to person, place, and time.  Psychiatric:        Behavior: Behavior normal.        Thought Content: Thought content normal.        Judgment: Judgment normal.    There were no vitals taken for this visit.  HBA1c 7.4%       Assessment & Plan:   Tyler Garner comes in today with chief complaint of No chief complaint on file.   Diagnosis and orders addressed:  1. Primary hypertension Low sodium diet - CBC with Differential/Platelet - CMP14+EGFR - amLODipine (NORVASC) 5 MG tablet; Take 1 tablet (5 mg total) by mouth daily.  Dispense: 90 tablet; Refill: 1 - lisinopril (ZESTRIL) 20 MG tablet; Take 1 tablet (20 mg total) by mouth daily.  Dispense: 90 tablet; Refill: 1  2. Hyperlipidemia associated with type 2 diabetes mellitus (HCC) Low fat diet - Lipid panel - rosuvastatin (CRESTOR) 40 MG tablet; Take 0.5 tablets (20 mg total) by mouth daily.  Dispense: 135 tablet; Refill: 1  3. Type 2 diabetes mellitus with diabetic polyneuropathy, with long-term current use of insulin (HCC) Continue to watch carbs in diet - Bayer DCA Hb A1c Waived - glimepiride (AMARYL) 4 MG tablet; Take 1 tablet (4 mg total) by mouth 2 (two) times daily.  Dispense: 180 tablet; Refill: 1 - insulin glargine (LANTUS SOLOSTAR) 100 UNIT/ML Solostar Pen; Inject 55 Units into the skin at bedtime.  Dispense: 30 mL; Refill: 3 - JANUMET 50-1000 MG tablet; Take 1 tablet by mouth 2 (two) times daily.  Dispense: 180 tablet; Refill: 1  4.  Diabetes mellitus treated with insulin and oral medication (HCC)  5. Hypokalemia Labs pending  6. Benign prostatic hyperplasia without lower urinary tract symptoms Report any voiding issues  7. BMI 30.0-30.9,adult Discussed diet and exercise for person with BMI >25 Will recheck weight in 3-6 months   8. Atherosclerosis of native coronary artery of native heart with angina pectoris with documented spasm (HCC) - metoprolol tartrate (LOPRESSOR) 25 MG tablet; Take 1 tablet (25 mg total) by mouth 2 (two) times daily.  Dispense: 180 tablet; Refill: 1  9. Atherosclerosis of native coronary artery of native heart without angina pectoris  - omega-3 acid ethyl esters (LOVAZA) 1 g capsule; Take 1 capsule (1 g total) by mouth 2 (two) times daily.  Dispense: 180 capsule; Refill: 3   Patient refused lab stick- only would agree to finger stick for HGBA1c Health Maintenance reviewed Diet and exercise encouraged  Follow up plan: 6 months   [name] Daphine Deutscher, [name]

## 2023-05-17 NOTE — Patient Instructions (Signed)
Oxymetazoline Nasal Spray What is this medication? OXYMETAZOLINE (OX ee me TAZ oh leen) treats a runny or stuffy nose. It may also be used to treat sinus congestion and pressure. It is often used for a short period of time. It works by decreasing swelling in the nose, making it easier to breathe. It belongs to a group of medications called decongestants. This medicine may be used for other purposes; ask your health care provider or pharmacist if you have questions. COMMON BRAND NAME(S): 12 Hour Nasal, Afrin, Afrin Extra Moisturizing, Afrin Nasal Sinus, Afrin No Drip Severe Congestion, Children's Afrin, Dristan, Duration, Genasal, Mucinex Children's Stuffy Nose, Mucinex Full Force, Mucinex Moisture Smart, Mucinex Sinus-Max, Mucinex Sinus-Max Sinus & Allergy, NASAL Decongestant, Nasal Relief, Neo-Synephrine 12-Hour, Neo-Synephrine Severe Sinus Congestion, Nostrilla Fast Relief, Reliable-1 12 hour Decongestant, Sinex 12-Hour, Sudafed OM Sinus Cold Moisturizing, Sudafed OM Sinus Congestion Moisturizing, Vicks Qlearquil Decongestant, Vicks Sinex, Vicks Sinex Severe, Vicks Sinus Daytime, Zicam Extreme Congestion Relief, Zicam Intense Sinus What should I tell my care team before I take this medication? They need to know if you have any of these conditions: Diabetes Glaucoma Heart disease High or low blood pressure History of stroke Raynaud's phenomenon Scleroderma Sjogren's syndrome Thromboangiitis obliterans Thyroid disease Trouble urinating due to an enlarged prostate gland An unusual or allergic reaction to oxymetazoline, other medications, foods, dyes, or preservatives Pregnant or trying to get pregnant Breast-feeding How should I use this medication? This medication is for use in the nose. Take it as directed on the label. Shake well before using. Do not use it more often than directed. Do not use for more than 3 days in a row without talking to your care team first. Make sure that you are using  your nasal spray correctly. Ask your care team if you have any questions. Talk to your care team about the use of this medication in children. While it may be given to children as young as 2 years for selected conditions, precautions do apply. Overdosage: If you think you have taken too much of this medicine contact a poison control center or emergency room at once. NOTE: This medicine is only for you. Do not share this medicine with others. What if I miss a dose? If you miss a dose, use it as soon as you can. If it is almost time for your next dose, use only that dose. Do not use double or extra doses. What may interact with this medication? This medication may interact with the following: Certain medications for an enlarged prostate, such as alfuzosin, doxazosin, prazosin, and terazosin Certain medications for blood pressure and heart disease, such as ace-inhibitors, beta-blockers, calcium-channel blockers, digoxin, and diuretics MAOIs, such as isocarboxazid, phenelzine, rasagiline, selegiline, and tranylcypromine Nafarelin This list may not describe all possible interactions. Give your health care provider a list of all the medicines, herbs, non-prescription drugs, or dietary supplements you use. Also tell them if you smoke, drink alcohol, or use illegal drugs. Some items may interact with your medicine. What should I watch for while using this medication? Visit your care team for regular checks on your progress. Tell your care team if your symptoms do not start to get better or if they get worse. To prevent the spread of infection, do not share this medication with anyone else. What side effects may I notice from receiving this medication? Side effects that you should report to your care team as soon as possible: Allergic reactions--skin rash, itching, hives, swelling of the face,  lips, tongue, or throat Side effects that usually do not require medical attention (report these to your care team  if they continue or are bothersome): Dryness or irritation inside the nose Sneezing Worsening runny or stuffy nose This list may not describe all possible side effects. Call your doctor for medical advice about side effects. You may report side effects to FDA at 1-800-FDA-1088. Where should I keep my medication? Keep out of the reach of children and pets. Store at room temperature between 20 and 25 degrees C (68 and 77 degrees F). Get rid of any unused medication after the expiration date. To get rid of medications that are no longer needed or have expired: Take the medication to a medication take-back program. Check with your pharmacy or law enforcement to find a location. If you cannot return the medication, ask your pharmacist or care team how to get rid of this medication safely. NOTE: This sheet is a summary. It may not cover all possible information. If you have questions about this medicine, talk to your doctor, pharmacist, or health care provider.  2024 Elsevier/Gold Standard (2022-09-21 00:00:00)

## 2023-06-28 ENCOUNTER — Other Ambulatory Visit: Payer: Self-pay | Admitting: Nurse Practitioner

## 2023-06-28 DIAGNOSIS — M545 Low back pain, unspecified: Secondary | ICD-10-CM

## 2023-06-28 DIAGNOSIS — I25111 Atherosclerotic heart disease of native coronary artery with angina pectoris with documented spasm: Secondary | ICD-10-CM

## 2023-06-28 DIAGNOSIS — Z794 Long term (current) use of insulin: Secondary | ICD-10-CM

## 2023-06-28 DIAGNOSIS — E1165 Type 2 diabetes mellitus with hyperglycemia: Secondary | ICD-10-CM

## 2023-06-28 DIAGNOSIS — I10 Essential (primary) hypertension: Secondary | ICD-10-CM

## 2023-07-20 ENCOUNTER — Encounter: Payer: Self-pay | Admitting: *Deleted

## 2023-08-05 ENCOUNTER — Other Ambulatory Visit: Payer: Self-pay | Admitting: Nurse Practitioner

## 2023-08-05 DIAGNOSIS — G8929 Other chronic pain: Secondary | ICD-10-CM

## 2023-08-14 ENCOUNTER — Other Ambulatory Visit: Payer: Self-pay | Admitting: Nurse Practitioner

## 2023-08-14 DIAGNOSIS — I10 Essential (primary) hypertension: Secondary | ICD-10-CM

## 2023-09-06 ENCOUNTER — Ambulatory Visit: Payer: Medicare Other | Admitting: Nurse Practitioner

## 2023-09-13 ENCOUNTER — Encounter: Payer: Self-pay | Admitting: Nurse Practitioner

## 2023-09-13 ENCOUNTER — Ambulatory Visit: Payer: Medicare Other | Admitting: Nurse Practitioner

## 2023-09-13 ENCOUNTER — Ambulatory Visit (INDEPENDENT_AMBULATORY_CARE_PROVIDER_SITE_OTHER)

## 2023-09-13 VITALS — BP 128/71 | HR 75 | Temp 98.3°F | Ht 70.0 in | Wt 218.0 lb

## 2023-09-13 DIAGNOSIS — M5136 Other intervertebral disc degeneration, lumbar region with discogenic back pain only: Secondary | ICD-10-CM | POA: Diagnosis not present

## 2023-09-13 DIAGNOSIS — E785 Hyperlipidemia, unspecified: Secondary | ICD-10-CM | POA: Diagnosis not present

## 2023-09-13 DIAGNOSIS — E1169 Type 2 diabetes mellitus with other specified complication: Secondary | ICD-10-CM | POA: Diagnosis not present

## 2023-09-13 DIAGNOSIS — D649 Anemia, unspecified: Secondary | ICD-10-CM

## 2023-09-13 DIAGNOSIS — I25111 Atherosclerotic heart disease of native coronary artery with angina pectoris with documented spasm: Secondary | ICD-10-CM

## 2023-09-13 DIAGNOSIS — Z794 Long term (current) use of insulin: Secondary | ICD-10-CM | POA: Diagnosis not present

## 2023-09-13 DIAGNOSIS — Z0001 Encounter for general adult medical examination with abnormal findings: Secondary | ICD-10-CM | POA: Diagnosis not present

## 2023-09-13 DIAGNOSIS — E119 Type 2 diabetes mellitus without complications: Secondary | ICD-10-CM

## 2023-09-13 DIAGNOSIS — Z7984 Long term (current) use of oral hypoglycemic drugs: Secondary | ICD-10-CM | POA: Diagnosis not present

## 2023-09-13 DIAGNOSIS — Z0000 Encounter for general adult medical examination without abnormal findings: Secondary | ICD-10-CM

## 2023-09-13 DIAGNOSIS — I10 Essential (primary) hypertension: Secondary | ICD-10-CM

## 2023-09-13 DIAGNOSIS — I259 Chronic ischemic heart disease, unspecified: Secondary | ICD-10-CM

## 2023-09-13 DIAGNOSIS — E876 Hypokalemia: Secondary | ICD-10-CM

## 2023-09-13 DIAGNOSIS — I509 Heart failure, unspecified: Secondary | ICD-10-CM | POA: Diagnosis not present

## 2023-09-13 DIAGNOSIS — I5022 Chronic systolic (congestive) heart failure: Secondary | ICD-10-CM

## 2023-09-13 DIAGNOSIS — Z125 Encounter for screening for malignant neoplasm of prostate: Secondary | ICD-10-CM

## 2023-09-13 DIAGNOSIS — IMO0002 Reserved for concepts with insufficient information to code with codable children: Secondary | ICD-10-CM

## 2023-09-13 DIAGNOSIS — N400 Enlarged prostate without lower urinary tract symptoms: Secondary | ICD-10-CM

## 2023-09-13 DIAGNOSIS — E1142 Type 2 diabetes mellitus with diabetic polyneuropathy: Secondary | ICD-10-CM | POA: Diagnosis not present

## 2023-09-13 DIAGNOSIS — I110 Hypertensive heart disease with heart failure: Secondary | ICD-10-CM

## 2023-09-13 LAB — BAYER DCA HB A1C WAIVED: HB A1C (BAYER DCA - WAIVED): 7.5 % — ABNORMAL HIGH (ref 4.8–5.6)

## 2023-09-13 LAB — LIPID PANEL

## 2023-09-13 MED ORDER — LANTUS SOLOSTAR 100 UNIT/ML ~~LOC~~ SOPN: 50.0000 [IU] | PEN_INJECTOR | Freq: Every day | SUBCUTANEOUS | 3 refills | Status: DC

## 2023-09-13 MED ORDER — GLIMEPIRIDE 4 MG PO TABS: 4.0000 mg | ORAL_TABLET | Freq: Two times a day (BID) | ORAL | 1 refills | Status: DC

## 2023-09-13 MED ORDER — ROSUVASTATIN CALCIUM 40 MG PO TABS: 20.0000 mg | ORAL_TABLET | Freq: Every day | ORAL | 1 refills | Status: DC

## 2023-09-13 MED ORDER — LISINOPRIL 20 MG PO TABS: 20.0000 mg | ORAL_TABLET | Freq: Every day | ORAL | 1 refills | Status: DC

## 2023-09-13 MED ORDER — AMLODIPINE BESYLATE 5 MG PO TABS
5.0000 mg | ORAL_TABLET | Freq: Every day | ORAL | 1 refills | Status: DC
Start: 2023-09-13 — End: 2023-12-10

## 2023-09-13 MED ORDER — METOPROLOL TARTRATE 25 MG PO TABS: 25.0000 mg | ORAL_TABLET | Freq: Two times a day (BID) | ORAL | 1 refills | Status: DC

## 2023-09-13 NOTE — Progress Notes (Signed)
 Subjective:    Patient ID: Tyler Garner, male    DOB: 1955-07-23, 68 y.o.   MRN: [id number]   Chief Complaint: annual physical    HPI:  Tyler Garner is a 68 y.o. who identifies as a male who was assigned male at birth.   Social history: Lives with: his son lives with him Work history: retired   Water engineer in today for follow up of the following chronic medical issues:  1. Annual physical exam  2. Primary hypertension No c/o chest pain, sob or headache. Does not check blood pressure at home. BP Readings from Last 3 Encounters:  05/17/23 (!) 143/66  11/17/22 133/70  08/18/22 136/78     3. Chronic systolic CHF (congestive heart failure) (HCC) 4. Atherosclerosis of native coronary artery of native heart with angina pectoris with documented spasm (HCC) 5. Ischemic heart disease due to coronary artery obstruction (HCC) Last aw cardiology in 02/2021. He had myocardial perfusion study  in 02/2021 which report showed heart was good. He has not been back since then to be seen.  6. Hyperlipidemia associated with type 2 diabetes mellitus (HCC) He does try  to watch diet but does not do much exercise. Lab Results  Component Value Date   CHOL 123 11/17/2022   HDL 26 (L) 11/17/2022   LDLCALC 69 11/17/2022   TRIG 164 (H) 11/17/2022   CHOLHDL 4.7 11/17/2022     7. Type 2 diabetes mellitus with diabetic polyneuropathy, with long-term current use of insulin  (HCC) Fasting blood sugars are running around 70-130. He has only been taking 45u of lantus  nightly. Janumet  is going to cost hnim over 400$ this year. Lab Results  Component Value Date   HGBA1C 7.4 (H) 05/17/2023     8. Hypokalemia No c/o muscle cramps Lab Results  Component Value Date   K 5.4 (H) 11/19/2022     9. Benign prostatic hyperplasia without lower urinary tract symptoms No voinding issues Lab Results  Component Value Date   PSA1 0.5 05/19/2022   PSA1 0.9 02/18/2021   PSA1 0.7 11/16/2019   PSA 0.68  12/05/2012      10. DDD (degenerative disc disease), lumbar He is doing okay. Has been using a cane to walk just to steady hisself. Pain is only when he is standing or walking  11. Morbid obesity (HCC) No recent weight changes  Wt Readings from Last 3 Encounters:  09/13/23 218 lb (98.9 kg)  05/17/23 216 lb (98 kg)  11/17/22 212 lb (96.2 kg)   BMI Readings from Last 3 Encounters:  09/13/23 31.28 kg/m  05/17/23 30.99 kg/m  11/17/22 30.42 kg/m      New complaints: None today  Allergies  Allergen Reactions   Percocet [Oxycodone -Acetaminophen ] Nausea And Vomiting   Outpatient Encounter Medications as of 09/13/2023  Medication Sig   ACCU-CHEK GUIDE test strip CHECK BLOOD SUGAR DAILY AND AS NEEDED DX E11.9   Accu-Chek Softclix Lancets lancets Check blood sugar daily and prn  Dx E11.9   amLODipine  (NORVASC ) 5 MG tablet TAKE 1 TABLET (5 MG TOTAL) BY MOUTH DAILY.   Blood Glucose Monitoring Suppl (ACCU-CHEK GUIDE ME) w/Device KIT CHECK BLOOD SUGAR DAILY AND AS NEEDED DX E11.9   celecoxib  (CELEBREX ) 200 MG capsule TAKE 1 CAPSULE BY MOUTH TWICE A DAY   fluticasone  (FLONASE ) 50 MCG/ACT nasal spray Place 2 sprays into both nostrils daily.   glimepiride  (AMARYL ) 4 MG tablet TAKE 1 TABLET BY MOUTH 2 TIMES DAILY.   insulin  glargine (LANTUS   SOLOSTAR) 100 UNIT/ML Solostar Pen Inject 55 Units into the skin at bedtime.   Insulin  Pen Needle (B-D ULTRAFINE III SHORT PEN) 31G X 8 MM MISC USE WITH LANTUS  SOLOSAR PEN DAILY Dx E11.9   JANUMET  50-1000 MG tablet TAKE 1 TABLET BY MOUTH TWICE A DAY   lisinopril  (ZESTRIL ) 20 MG tablet TAKE 1 TABLET BY MOUTH EVERY DAY   metoprolol  tartrate (LOPRESSOR ) 25 MG tablet TAKE 1 TABLET BY MOUTH TWICE A DAY   omega-3 acid ethyl esters (LOVAZA ) 1 g capsule Take 1 capsule (1 g total) by mouth 2 (two) times daily.   ondansetron  (ZOFRAN ) 4 MG tablet Take 1 tablet (4 mg total) by mouth daily as needed for up to 30 doses for nausea or vomiting.   rosuvastatin   (CRESTOR ) 40 MG tablet Take 0.5 tablets (20 mg total) by mouth daily.   No facility-administered encounter medications on file as of 09/13/2023.    Past Surgical History:  Procedure Laterality Date   heart stent  06/21/2013   x 1 to distal LCX   LEFT HEART CATHETERIZATION WITH CORONARY ANGIOGRAM N/A 06/21/2013   Procedure: LEFT HEART CATHETERIZATION WITH CORONARY ANGIOGRAM;  Surgeon: Peter M Swaziland, MD;  Location: [facility] CATH LAB;  Service: Cardiovascular;  Laterality: N/A;   LUMBAR LAMINECTOMY/DECOMPRESSION MICRODISCECTOMY N/A 11/28/2020   Procedure: LUMBAR DECOMPRESSION  2 LEVELS, L3 and L5;  Surgeon: Mort Ards, MD;  Location: [facility] OR;  Service: Orthopedics;  Laterality: N/A;  3.5 hrs   TRANSURETHRAL RESECTION OF PROSTATE N/A 03/28/2021   Procedure: TRANSURETHRAL RESECTION OF THE PROSTATE (TURP)/ BIPOLAR;  Surgeon: Lahoma Pigg, MD;  Location: [facility];  Service: Urology;  Laterality: N/A;    Family History  Problem Relation Age of Onset   Diabetes Mother    ALS Mother    Alzheimer's disease Father    Healthy Sister    Diabetes Brother       Controlled substance contract: n/a     Review of Systems  Constitutional:  Negative for diaphoresis.  Eyes:  Negative for pain.  Respiratory:  Negative for shortness of breath.   Cardiovascular:  Negative for chest pain, palpitations and leg swelling.  Gastrointestinal:  Negative for abdominal pain.  Endocrine: Negative for polydipsia.  Skin:  Negative for rash.  Neurological:  Negative for dizziness, weakness and headaches.  Hematological:  Does not bruise/bleed easily.  All other systems reviewed and are negative.      Objective:   Physical Exam Vitals and nursing note reviewed.  Constitutional:      Appearance: Normal appearance. He is well-developed.  HENT:     Head: Normocephalic.     Nose: Nose normal.     Mouth/Throat:     Mouth: Mucous membranes are moist.     Pharynx: Oropharynx is clear.   Eyes:     Pupils: Pupils are equal, round, and reactive to light.  Neck:     Thyroid: No thyroid mass or thyromegaly.     Vascular: No carotid bruit or JVD.     Trachea: Phonation normal.  Cardiovascular:     Rate and Rhythm: Normal rate and regular rhythm.  Pulmonary:     Effort: Pulmonary effort is normal. No respiratory distress.     Breath sounds: Normal breath sounds.  Abdominal:     General: Bowel sounds are normal.     Palpations: Abdomen is soft.     Tenderness: There is no abdominal tenderness.  Musculoskeletal:  General: Normal range of motion.     Cervical back: Normal range of motion and neck supple.     Comments: Walking with cane  Lymphadenopathy:     Cervical: No cervical adenopathy.  Skin:    General: Skin is warm and dry.  Neurological:     Mental Status: He is alert and oriented to person, place, and time.  Psychiatric:        Behavior: Behavior normal.        Thought Content: Thought content normal.        Judgment: Judgment normal.     BP 128/71   Pulse 75   Temp 98.3 F (36.8 C) (Temporal)   Ht 5\' 10"  (1.778 m)   Wt 218 lb (98.9 kg)   SpO2 100%   BMI 31.28 kg/m    Hgab1c 7.5% EKG- NSR- reading by Irvine Mantis, [name]  [facility] Chest xray clear - Preliminary reading by Irvine Mantis, [name]  [facility]      Assessment & Plan:  Tyler Garner comes in today with chief complaint of annual physical  Diagnosis and orders addressed:  1. Annual physical exam   2. Primary hypertension Low sodium diet - CBC with Differential/Platelet - CMP14+EGFR - amLODipine  (NORVASC ) 5 MG tablet; Take 1 tablet (5 mg total) by mouth daily.  Dispense: 90 tablet; Refill: 1 - lisinopril  (ZESTRIL ) 20 MG tablet; Take 1 tablet (20 mg total) by mouth daily.  Dispense: 90 tablet; Refill: 1  3. Chronic systolic CHF (congestive heart failure) (HCC) 4. Atherosclerosis of native coronary artery of native heart with angina pectoris with documented spasm (HCC) - metoprolol   tartrate (LOPRESSOR ) 25 MG tablet; Take 1 tablet (25 mg total) by mouth 2 (two) times daily.  Dispense: 180 tablet; Refill: 1 5. Ischemic heart disease due to coronary artery obstruction (HCC)  6. Hyperlipidemia associated with type 2 diabetes mellitus (HCC) Low fat diet - Lipid panel - rosuvastatin  (CRESTOR ) 40 MG tablet; Take 0.5 tablets (20 mg total) by mouth daily.  Dispense: 135 tablet; Refill: 1  7. Type 2 diabetes mellitus with diabetic polyneuropathy, with long-term current use of insulin  (HCC) Decrease lantus  to 55u daily- take daily even if blood sugars are normal - Bayer DCA Hb A1c Waived - Microalbumin / creatinine urine ratio Hold Janumet  Make sure take lantus  50u nightly - glimepiride  (AMARYL ) 4 MG tablet; Take 1 tablet (4 mg total) by mouth 2 (two) times daily.  Dispense: 180 tablet; Refill: 1 - insulin  glargine (LANTUS  SOLOSTAR) 100 UNIT/ML Solostar Pen; Inject 55 Units into the skin at bedtime.  Dispense: 30 mL; Refill: 3 - JANUMET  50-1000 MG tablet; Take 1 tablet by mouth 2 (two) times daily.  Dispense: 180 tablet; Refill: 1  8. Hypokalemia Labs pending  9. Benign prostatic hyperplasia without lower urinary tract symptoms Labs pending' report any issues voiding  10. DDD (degenerative disc disease), lumbar Continue celebrex   11. Morbid obesity (HCC) Discussed diet and exercise for person with BMI >25 Will recheck weight in 3-6 months   12. Atherosclerosis of native coronary artery of native heart without angina pectoris - omega-3 acid ethyl esters (LOVAZA ) 1 g capsule; Take 1 capsule (1 g total) by mouth 2 (two) times daily.  Dispense: 180 capsule; Refill: 3  13. Chronic midline low back pain without sciatica  - celecoxib  (CELEBREX ) 200 MG capsule; Take 1 capsule (200 mg total) by mouth 2 (two) times daily.  Dispense: 60 capsule; Refill: 2    Labs pending Health Maintenance reviewed  Diet and exercise encouraged  Follow up plan: 3  months   [name] Gaylyn Keas, [name]

## 2023-09-13 NOTE — Patient Instructions (Signed)

## 2023-09-14 ENCOUNTER — Other Ambulatory Visit: Payer: Self-pay

## 2023-09-14 DIAGNOSIS — D649 Anemia, unspecified: Secondary | ICD-10-CM

## 2023-09-14 LAB — MICROALBUMIN / CREATININE URINE RATIO
Creatinine, Urine: 173.1 mg/dL
Microalb/Creat Ratio: 29 mg/g{creat} (ref 0–29)
Microalbumin, Urine: 49.4 ug/mL

## 2023-09-14 LAB — CBC WITH DIFFERENTIAL/PLATELET
Basophils Absolute: 0.1 10*3/uL (ref 0.0–0.2)
Basos: 1 %
EOS (ABSOLUTE): 0.1 10*3/uL (ref 0.0–0.4)
Eos: 1 %
Hematocrit: 33.3 % — ABNORMAL LOW (ref 37.5–51.0)
Hemoglobin: 9.4 g/dL — ABNORMAL LOW (ref 13.0–17.7)
Immature Grans (Abs): 0 10*3/uL (ref 0.0–0.1)
Immature Granulocytes: 0 %
Lymphocytes Absolute: 2.6 10*3/uL (ref 0.7–3.1)
Lymphs: 32 %
MCH: 19.7 pg — ABNORMAL LOW (ref 26.6–33.0)
MCHC: 28.2 g/dL — ABNORMAL LOW (ref 31.5–35.7)
MCV: 70 fL — ABNORMAL LOW (ref 79–97)
Monocytes Absolute: 0.7 10*3/uL (ref 0.1–0.9)
Monocytes: 9 %
Neutrophils Absolute: 4.6 10*3/uL (ref 1.4–7.0)
Neutrophils: 57 %
Platelets: 319 10*3/uL (ref 150–450)
RBC: 4.78 x10E6/uL (ref 4.14–5.80)
RDW: 16 % — ABNORMAL HIGH (ref 11.6–15.4)
WBC: 8.2 10*3/uL (ref 3.4–10.8)

## 2023-09-14 LAB — CMP14+EGFR
ALT: 8 IU/L (ref 0–44)
AST: 14 IU/L (ref 0–40)
Albumin: 4.5 g/dL (ref 3.9–4.9)
Alkaline Phosphatase: 55 IU/L (ref 44–121)
BUN/Creatinine Ratio: 17 (ref 10–24)
BUN: 16 mg/dL (ref 8–27)
Bilirubin Total: 0.2 mg/dL (ref 0.0–1.2)
CO2: 21 mmol/L (ref 20–29)
Calcium: 9.4 mg/dL (ref 8.6–10.2)
Chloride: 103 mmol/L (ref 96–106)
Creatinine, Ser: 0.94 mg/dL (ref 0.76–1.27)
Globulin, Total: 2.6 g/dL (ref 1.5–4.5)
Glucose: 147 mg/dL — ABNORMAL HIGH (ref 70–99)
Potassium: 4.9 mmol/L (ref 3.5–5.2)
Sodium: 142 mmol/L (ref 134–144)
Total Protein: 7.1 g/dL (ref 6.0–8.5)
eGFR: 88 mL/min/{1.73_m2} (ref >59)

## 2023-09-14 LAB — LIPID PANEL
Cholesterol, Total: 134 mg/dL (ref 100–199)
HDL: 22 mg/dL — ABNORMAL LOW (ref >39)
LDL CALC COMMENT:: 6.1 ratio — ABNORMAL HIGH (ref 0.0–5.0)
LDL Chol Calc (NIH): 70 mg/dL (ref 0–99)
Triglycerides: 258 mg/dL — ABNORMAL HIGH (ref 0–149)
VLDL Cholesterol Cal: 42 mg/dL — ABNORMAL HIGH (ref 5–40)

## 2023-09-14 LAB — PSA, TOTAL AND FREE
PSA, Free Pct: 35 %
PSA, Free: 0.14 ng/mL
Prostate Specific Ag, Serum: 0.4 ng/mL (ref 0.0–4.0)

## 2023-09-14 NOTE — Addendum Note (Signed)
 Addended by: Emori Mumme, [name] on: 09/14/2023 12:18 PM   Modules accepted: Orders

## 2023-09-28 ENCOUNTER — Other Ambulatory Visit: Payer: Self-pay | Admitting: Nurse Practitioner

## 2023-09-28 DIAGNOSIS — M545 Low back pain, unspecified: Secondary | ICD-10-CM

## 2023-11-20 ENCOUNTER — Other Ambulatory Visit: Payer: Self-pay | Admitting: Nurse Practitioner

## 2023-11-20 DIAGNOSIS — G8929 Other chronic pain: Secondary | ICD-10-CM

## 2023-12-10 ENCOUNTER — Encounter: Payer: Self-pay | Admitting: Nurse Practitioner

## 2023-12-10 ENCOUNTER — Ambulatory Visit

## 2023-12-10 ENCOUNTER — Ambulatory Visit: Admitting: Nurse Practitioner

## 2023-12-10 VITALS — BP 129/60 | HR 51 | Temp 97.5°F | Ht 70.0 in | Wt 215.0 lb

## 2023-12-10 DIAGNOSIS — E785 Hyperlipidemia, unspecified: Secondary | ICD-10-CM | POA: Diagnosis not present

## 2023-12-10 DIAGNOSIS — I25111 Atherosclerotic heart disease of native coronary artery with angina pectoris with documented spasm: Secondary | ICD-10-CM

## 2023-12-10 DIAGNOSIS — E1169 Type 2 diabetes mellitus with other specified complication: Secondary | ICD-10-CM | POA: Diagnosis not present

## 2023-12-10 DIAGNOSIS — G8929 Other chronic pain: Secondary | ICD-10-CM

## 2023-12-10 DIAGNOSIS — M545 Low back pain, unspecified: Secondary | ICD-10-CM

## 2023-12-10 DIAGNOSIS — Z7984 Long term (current) use of oral hypoglycemic drugs: Secondary | ICD-10-CM

## 2023-12-10 DIAGNOSIS — E876 Hypokalemia: Secondary | ICD-10-CM | POA: Diagnosis not present

## 2023-12-10 DIAGNOSIS — Z794 Long term (current) use of insulin: Secondary | ICD-10-CM | POA: Diagnosis not present

## 2023-12-10 DIAGNOSIS — E119 Type 2 diabetes mellitus without complications: Secondary | ICD-10-CM

## 2023-12-10 DIAGNOSIS — E1142 Type 2 diabetes mellitus with diabetic polyneuropathy: Secondary | ICD-10-CM

## 2023-12-10 DIAGNOSIS — I10 Essential (primary) hypertension: Secondary | ICD-10-CM | POA: Diagnosis not present

## 2023-12-10 DIAGNOSIS — N400 Enlarged prostate without lower urinary tract symptoms: Secondary | ICD-10-CM

## 2023-12-10 LAB — LIPID PANEL

## 2023-12-10 LAB — BAYER DCA HB A1C WAIVED: HB A1C (BAYER DCA - WAIVED): 6.8 % — ABNORMAL HIGH (ref 4.8–5.6)

## 2023-12-10 LAB — HM DIABETES EYE EXAM

## 2023-12-10 MED ORDER — ROSUVASTATIN CALCIUM 40 MG PO TABS: 20.0000 mg | ORAL_TABLET | Freq: Every day | ORAL | 1 refills | Status: DC

## 2023-12-10 MED ORDER — CELECOXIB 200 MG PO CAPS: 200.0000 mg | ORAL_CAPSULE | Freq: Two times a day (BID) | ORAL | 1 refills | Status: DC

## 2023-12-10 MED ORDER — GLIMEPIRIDE 4 MG PO TABS: 4.0000 mg | ORAL_TABLET | Freq: Two times a day (BID) | ORAL | 1 refills | Status: DC

## 2023-12-10 MED ORDER — METOPROLOL TARTRATE 25 MG PO TABS: 25.0000 mg | ORAL_TABLET | Freq: Two times a day (BID) | ORAL | 1 refills | Status: DC

## 2023-12-10 MED ORDER — LANTUS SOLOSTAR 100 UNIT/ML ~~LOC~~ SOPN: 50.0000 [IU] | PEN_INJECTOR | Freq: Every day | SUBCUTANEOUS | 3 refills | Status: DC

## 2023-12-10 MED ORDER — AMLODIPINE BESYLATE 5 MG PO TABS: 5.0000 mg | ORAL_TABLET | Freq: Every day | ORAL | 1 refills | Status: DC

## 2023-12-10 MED ORDER — LISINOPRIL 20 MG PO TABS: 20.0000 mg | ORAL_TABLET | Freq: Every day | ORAL | 1 refills | Status: DC

## 2023-12-10 NOTE — Progress Notes (Signed)
 Subjective:    Patient ID: Tyler Garner, male    DOB: 1956-02-05, 68 y.o.   MRN: [id number]   Chief Complaint: medical management of chronic issues     HPI:  Tyler Garner is a 68 y.o. who identifies as a male who was assigned male at birth.   Social history: Lives with: his son lives with him Work history: retired   Water engineer in today for follow up of the following chronic medical issues:  1. Primary hypertension No c/o chest pain, sob or headache. Does not check blood pressure at home. BP Readings from Last 3 Encounters:  09/13/23 128/71  05/17/23 (!) 143/66  11/17/22 133/70    2. Hyperlipidemia associated with type 2 diabetes mellitus (HCC) Does try to watch diet but does no dedicated exercise. Lab Results  Component Value Date   CHOL 134 09/13/2023   HDL 22 (L) 09/13/2023   LDLCALC 70 09/13/2023   TRIG 258 (H) 09/13/2023   CHOLHDL 6.1 (H) 09/13/2023   The ASCVD Risk score (Arnett DK, et al., 2019) failed to calculate for the following reasons:   Risk score cannot be calculated because patient has a medical history suggesting prior/existing ASCVD   3. Type 2 diabetes mellitus with diabetic polyneuropathy, with long-term current use of insulin  (HCC) 4. Diabetes mellitus treated with insulin  and oral medication (HCC) Fasting blood sugars are running around 110-150. No low blood sugars. Has numbness in all toes. Lab Results  Component Value Date   HGBA1C 7.5 (H) 09/13/2023     5. Hypokalemia No muscle cramps Lab Results  Component Value Date   K 4.9 09/13/2023     6. Benign prostatic hyperplasia without lower urinary tract symptoms No voiding issues Lab Results  Component Value Date   PSA1 0.4 09/13/2023   PSA1 0.5 05/19/2022   PSA1 0.9 02/18/2021   PSA 0.68 12/05/2012      7. Morbid obesity (HCC) Weight down 3 lbs Wt Readings from Last 3 Encounters:  09/13/23 218 lb (98.9 kg)  05/17/23 216 lb (98 kg)  11/17/22 212 lb (96.2 kg)   BMI  Readings from Last 3 Encounters:  09/13/23 31.28 kg/m  05/17/23 30.99 kg/m  11/17/22 30.42 kg/m      New complaints: Arthritis is getting bad but he does not want to see speciaist as of yet.  Allergies  Allergen Reactions   Percocet [Oxycodone -Acetaminophen ] Nausea And Vomiting   Outpatient Encounter Medications as of 12/10/2023  Medication Sig   ACCU-CHEK GUIDE test strip CHECK BLOOD SUGAR DAILY AND AS NEEDED DX E11.9   Accu-Chek Softclix Lancets lancets Check blood sugar daily and prn  Dx E11.9   amLODipine  (NORVASC ) 5 MG tablet Take 1 tablet (5 mg total) by mouth daily.   Blood Glucose Monitoring Suppl (ACCU-CHEK GUIDE ME) w/Device KIT CHECK BLOOD SUGAR DAILY AND AS NEEDED DX E11.9   celecoxib  (CELEBREX ) 200 MG capsule TAKE 1 CAPSULE BY MOUTH TWICE A DAY   glimepiride  (AMARYL ) 4 MG tablet Take 1 tablet (4 mg total) by mouth 2 (two) times daily.   insulin  glargine (LANTUS  SOLOSTAR) 100 UNIT/ML Solostar Pen Inject 50 Units into the skin at bedtime.   Insulin  Pen Needle (B-D ULTRAFINE III SHORT PEN) 31G X 8 MM MISC USE WITH LANTUS  SOLOSAR PEN DAILY Dx E11.9   lisinopril  (ZESTRIL ) 20 MG tablet Take 1 tablet (20 mg total) by mouth daily.   metoprolol  tartrate (LOPRESSOR ) 25 MG tablet Take 1 tablet (25 mg total) by mouth  2 (two) times daily.   ondansetron  (ZOFRAN ) 4 MG tablet Take 1 tablet (4 mg total) by mouth daily as needed for up to 30 doses for nausea or vomiting.   rosuvastatin  (CRESTOR ) 40 MG tablet Take 0.5 tablets (20 mg total) by mouth daily.   No facility-administered encounter medications on file as of 12/10/2023.    Past Surgical History:  Procedure Laterality Date   heart stent  06/21/2013   x 1 to distal LCX   LEFT HEART CATHETERIZATION WITH CORONARY ANGIOGRAM N/A 06/21/2013   Procedure: LEFT HEART CATHETERIZATION WITH CORONARY ANGIOGRAM;  Surgeon: Peter M Swaziland, MD;  Location: [facility] CATH LAB;  Service: Cardiovascular;  Laterality: N/A;   LUMBAR  LAMINECTOMY/DECOMPRESSION MICRODISCECTOMY N/A 11/28/2020   Procedure: LUMBAR DECOMPRESSION  2 LEVELS, L3 and L5;  Surgeon: Burnetta Aures, MD;  Location: [facility] OR;  Service: Orthopedics;  Laterality: N/A;  3.5 hrs   TRANSURETHRAL RESECTION OF PROSTATE N/A 03/28/2021   Procedure: TRANSURETHRAL RESECTION OF THE PROSTATE (TURP)/ BIPOLAR;  Surgeon: Selma Donnice SAUNDERS, MD;  Location: [facility];  Service: Urology;  Laterality: N/A;    Family History  Problem Relation Age of Onset   Diabetes Mother    ALS Mother    Alzheimer's disease Father    Healthy Sister    Diabetes Brother       Controlled substance contract: n/a     Review of Systems  Constitutional:  Negative for diaphoresis.  Eyes:  Negative for pain.  Respiratory:  Negative for shortness of breath.   Cardiovascular:  Negative for chest pain, palpitations and leg swelling.  Gastrointestinal:  Negative for abdominal pain.  Endocrine: Negative for polydipsia.  Skin:  Negative for rash.  Neurological:  Negative for dizziness, weakness and headaches.  Hematological:  Does not bruise/bleed easily.  All other systems reviewed and are negative.      Objective:   Physical Exam Vitals and nursing note reviewed.  Constitutional:      Appearance: Normal appearance. He is well-developed.  HENT:     Head: Normocephalic.     Nose: Nose normal.     Mouth/Throat:     Mouth: Mucous membranes are moist.     Pharynx: Oropharynx is clear.  Eyes:     Pupils: Pupils are equal, round, and reactive to light.  Neck:     Thyroid: No thyroid mass or thyromegaly.     Vascular: No carotid bruit or JVD.     Trachea: Phonation normal.  Cardiovascular:     Rate and Rhythm: Normal rate and regular rhythm.  Pulmonary:     Effort: Pulmonary effort is normal. No respiratory distress.     Breath sounds: Normal breath sounds.  Abdominal:     General: Bowel sounds are normal.     Palpations: Abdomen is soft.     Tenderness: There is  no abdominal tenderness.  Musculoskeletal:        General: Normal range of motion.     Cervical back: Normal range of motion and neck supple.     Comments: [employer] with a cane  Lymphadenopathy:     Cervical: No cervical adenopathy.  Skin:    General: Skin is warm and dry.  Neurological:     Mental Status: He is alert and oriented to person, place, and time.  Psychiatric:        Behavior: Behavior normal.        Thought Content: Thought content normal.        Judgment:  Judgment normal.    BP 129/60   Pulse (!) 51   Temp (!) 97.5 F (36.4 C) (Temporal)   Ht 5' 10 (1.778 m)   Wt 215 lb (97.5 kg)   SpO2 100%   BMI 30.85 kg/m    HBA1c 6.8%       Assessment & Plan:   MICHARL HELMES comes in today with chief complaint of medical management of chronic issues    Diagnosis and orders addressed:  1. Primary hypertension Low sodium diet - CBC with Differential/Platelet - CMP14+EGFR - amLODipine  (NORVASC ) 5 MG tablet; Take 1 tablet (5 mg total) by mouth daily.  Dispense: 90 tablet; Refill: 1 - lisinopril  (ZESTRIL ) 20 MG tablet; Take 1 tablet (20 mg total) by mouth daily.  Dispense: 90 tablet; Refill: 1  2. Hyperlipidemia associated with type 2 diabetes mellitus (HCC) Low fat diet - Lipid panel - rosuvastatin  (CRESTOR ) 40 MG tablet; Take 0.5 tablets (20 mg total) by mouth daily.  Dispense: 135 tablet; Refill: 1  3. Type 2 diabetes mellitus with diabetic polyneuropathy, with long-term current use of insulin  (HCC) Continue to watch carbs in diet - Bayer DCA Hb A1c Waived - glimepiride  (AMARYL ) 4 MG tablet; Take 1 tablet (4 mg total) by mouth 2 (two) times daily.  Dispense: 180 tablet; Refill: 1 - insulin  glargine (LANTUS  SOLOSTAR) 100 UNIT/ML Solostar Pen; Inject 55 Units into the skin at bedtime.  Dispense: 30 mL; Refill: 3 - JANUMET  50-1000 MG tablet; Take 1 tablet by mouth 2 (two) times daily.  Dispense: 180 tablet; Refill: 1  4. Diabetes mellitus treated with insulin   and oral medication (HCC)  5. Hypokalemia Labs pending  6. Benign prostatic hyperplasia without lower urinary tract symptoms Report any voiding issues  7. BMI 30.0-30.9,adult Discussed diet and exercise for person with BMI >25 Will recheck weight in 3-6 months   8. Atherosclerosis of native coronary artery of native heart with angina pectoris with documented spasm (HCC) - metoprolol  tartrate (LOPRESSOR ) 25 MG tablet; Take 1 tablet (25 mg total) by mouth 2 (two) times daily.  Dispense: 180 tablet; Refill: 1  9. Atherosclerosis of native coronary artery of native heart without angina pectoris  - omega-3 acid ethyl esters (LOVAZA ) 1 g capsule; Take 1 capsule (1 g total) by mouth 2 (two) times daily.  Dispense: 180 capsule; Refill: 3   Patient refused lab stick- only would agree to finger stick for HGBA1c Health Maintenance reviewed Diet and exercise encouraged  Follow up plan: 6 months   [name] Gladis, [name]

## 2023-12-10 NOTE — Progress Notes (Signed)
 Tyler Garner arrived 12/10/2023 and has given verbal consent to obtain images and complete their overdue diabetic retinal screening.  The images have been sent to an ophthalmologist or optometrist for review and interpretation.  Results will be sent back to Tyler Mustard, [name] for review.  Patient has been informed they will be contacted when we receive the results via telephone or MyChart

## 2023-12-10 NOTE — Patient Instructions (Signed)
 Fall Prevention in the Home, Adult Falls can cause injuries and can happen to people of all ages. There are many things you can do to make your home safer and to help prevent falls. What actions can I take to prevent falls? General information Use good lighting in all rooms. Make sure to: Replace any light bulbs that burn out. Turn on the lights in dark areas and use night-lights. Keep items that you use often in easy-to-reach places. Lower the shelves around your home if needed. Move furniture so that there are clear paths around it. Do not use throw rugs or other things on the floor that can make you trip. If any of your floors are uneven, fix them. Add color or contrast paint or tape to clearly mark and help you see: Grab bars or handrails. First and last steps of staircases. Where the edge of each step is. If you use a ladder or stepladder: Make sure that it is fully opened. Do not climb a closed ladder. Make sure the sides of the ladder are locked in place. Have someone hold the ladder while you use it. Know where your pets are as you move through your home. What can I do in the bathroom?     Keep the floor dry. Clean up any water on the floor right away. Remove soap buildup in the bathtub or shower. Buildup makes bathtubs and showers slippery. Use non-skid mats or decals on the floor of the bathtub or shower. Attach bath mats securely with double-sided, non-slip rug tape. If you need to sit down in the shower, use a non-slip stool. Install grab bars by the toilet and in the bathtub and shower. Do not use towel bars as grab bars. What can I do in the bedroom? Make sure that you have a light by your bed that is easy to reach. Do not use any sheets or blankets on your bed that hang to the floor. Have a firm chair or bench with side arms that you can use for support when you get dressed. What can I do in the kitchen? Clean up any spills right away. If you need to reach something  above you, use a step stool with a grab bar. Keep electrical cords out of the way. Do not use floor polish or wax that makes floors slippery. What can I do with my stairs? Do not leave anything on the stairs. Make sure that you have a light switch at the top and the bottom of the stairs. Make sure that there are handrails on both sides of the stairs. Fix handrails that are broken or loose. Install non-slip stair treads on all your stairs if they do not have carpet. Avoid having throw rugs at the top or bottom of the stairs. Choose a carpet that does not hide the edge of the steps on the stairs. Make sure that the carpet is firmly attached to the stairs. Fix carpet that is loose or worn. What can I do on the outside of my home? Use bright outdoor lighting. Fix the edges of walkways and driveways and fix any cracks. Clear paths of anything that can make you trip, such as tools or rocks. Add color or contrast paint or tape to clearly mark and help you see anything that might make you trip as you walk through a door, such as a raised step or threshold. Trim any bushes or trees on paths to your home. Check to see if handrails are loose  or broken and that both sides of all steps have handrails. Install guardrails along the edges of any raised decks and porches. Have leaves, snow, or ice cleared regularly. Use sand, salt, or ice melter on paths if you live where there is ice and snow during the winter. Clean up any spills in your garage right away. This includes grease or oil spills. What other actions can I take? Review your medicines with your doctor. Some medicines can cause dizziness or changes in blood pressure, which increase your risk of falling. Wear shoes that: Have a low heel. Do not wear high heels. Have rubber bottoms and are closed at the toe. Feel good on your feet and fit well. Use tools that help you move around if needed. These include: Canes. Walkers. Scooters. Crutches. Ask  your doctor what else you can do to help prevent falls. This may include seeing a physical therapist to learn to do exercises to move better and get stronger. Where to find more information Centers for Disease Control and Prevention, STEADI: TonerPromos.no [employer] on Aging: BaseRingTones.pl National Institute on Aging: BaseRingTones.pl Contact a doctor if: You are afraid of falling at home. You feel weak, drowsy, or dizzy at home. You fall at home. Get help right away if you: Lose consciousness or have trouble moving after a fall. Have a fall that causes a head injury. These symptoms may be an emergency. Get help right away. Call 911. Do not wait to see if the symptoms will go away. Do not drive yourself to the hospital. This information is not intended to replace advice given to you by your health care provider. Make sure you discuss any questions you have with your health care provider. Document Revised: 01/12/2022 Document Reviewed: 01/12/2022 Elsevier Patient Education  2024 [employer].

## 2023-12-11 LAB — CMP14+EGFR
ALT: 8 IU/L (ref 0–44)
AST: 10 IU/L (ref 0–40)
Albumin: 4.6 g/dL (ref 3.9–4.9)
Alkaline Phosphatase: 62 IU/L (ref 44–121)
BUN/Creatinine Ratio: 16 (ref 10–24)
BUN: 17 mg/dL (ref 8–27)
Bilirubin Total: <0.2 mg/dL (ref 0.0–1.2)
CO2: 18 mmol/L — AB (ref 20–29)
Calcium: 9.6 mg/dL (ref 8.6–10.2)
Chloride: 106 mmol/L (ref 96–106)
Creatinine, Ser: 1.09 mg/dL (ref 0.76–1.27)
Globulin, Total: 2.6 g/dL (ref 1.5–4.5)
Sodium: 142 mmol/L (ref 134–144)
Total Protein: 7.2 g/dL (ref 6.0–8.5)
eGFR: 73 mL/min/1.73 (ref >59)

## 2023-12-11 LAB — CBC WITH DIFFERENTIAL/PLATELET
Basophils Absolute: 0.1 x10E3/uL (ref 0.0–0.2)
Basos: 1 %
EOS (ABSOLUTE): 0.2 x10E3/uL (ref 0.0–0.4)
Eos: 2 %
Hematocrit: 34.3 % — ABNORMAL LOW (ref 37.5–51.0)
Hemoglobin: 9.3 g/dL — ABNORMAL LOW (ref 13.0–17.7)
Immature Grans (Abs): 0 x10E3/uL (ref 0.0–0.1)
Immature Granulocytes: 0 %
Lymphocytes Absolute: 2 x10E3/uL (ref 0.7–3.1)
Lymphs: 27 %
MCH: 19.3 pg — ABNORMAL LOW (ref 26.6–33.0)
MCHC: 27.1 g/dL — ABNORMAL LOW (ref 31.5–35.7)
MCV: 71 fL — ABNORMAL LOW (ref 79–97)
Monocytes Absolute: 0.7 x10E3/uL (ref 0.1–0.9)
Monocytes: 9 %
Neutrophils Absolute: 4.5 x10E3/uL (ref 1.4–7.0)
Neutrophils: 61 %
Platelets: 477 x10E3/uL — ABNORMAL HIGH (ref 150–450)
RBC: 4.82 x10E6/uL (ref 4.14–5.80)
RDW: 17.7 % — ABNORMAL HIGH (ref 11.6–15.4)
WBC: 7.4 x10E3/uL (ref 3.4–10.8)

## 2023-12-11 LAB — LIPID PANEL
Cholesterol, Total: 110 mg/dL (ref 100–199)
HDL: 22 mg/dL — AB (ref >39)
LDL CALC COMMENT:: 5 ratio (ref 0.0–5.0)
LDL Chol Calc (NIH): 56 mg/dL (ref 0–99)
Triglycerides: 194 mg/dL — AB (ref 0–149)
VLDL Cholesterol Cal: 32 mg/dL (ref 5–40)

## 2023-12-14 ENCOUNTER — Ambulatory Visit

## 2023-12-20 ENCOUNTER — Ambulatory Visit: Payer: Self-pay | Admitting: Nurse Practitioner

## 2023-12-20 DIAGNOSIS — D649 Anemia, unspecified: Secondary | ICD-10-CM

## 2023-12-21 ENCOUNTER — Other Ambulatory Visit: Payer: Self-pay

## 2023-12-21 DIAGNOSIS — R319 Hematuria, unspecified: Secondary | ICD-10-CM

## 2023-12-21 NOTE — Telephone Encounter (Signed)
 No answer and vmail not setup.

## 2023-12-21 NOTE — Telephone Encounter (Signed)
 Copied from CRM [id number]. Topic: Clinical - Medical Advice >> Dec 21, 2023  3:40 PM Elle L wrote: Reason for CRM: The patient states he received a call from his Oncology referral and would like more information on what low hemoglobin means.

## 2023-12-27 DIAGNOSIS — R310 Gross hematuria: Secondary | ICD-10-CM | POA: Diagnosis not present

## 2023-12-27 DIAGNOSIS — R3912 Poor urinary stream: Secondary | ICD-10-CM | POA: Diagnosis not present

## 2023-12-28 ENCOUNTER — Ambulatory Visit (INDEPENDENT_AMBULATORY_CARE_PROVIDER_SITE_OTHER)

## 2023-12-28 VITALS — BP 129/60 | HR 51 | Ht 70.0 in | Wt 215.0 lb

## 2023-12-28 DIAGNOSIS — Z0000 Encounter for general adult medical examination without abnormal findings: Secondary | ICD-10-CM | POA: Diagnosis not present

## 2023-12-28 NOTE — Patient Instructions (Signed)
 Mr. Tyler Garner , Thank you for taking time out of your busy schedule to complete your Annual Wellness Visit with me. I enjoyed our conversation and look forward to speaking with you again next year. I, as well as your care team,  appreciate your ongoing commitment to your health goals. Please review the following plan we discussed and let me know if I can assist you in the future. Your Game plan/ To Do List    Referrals: If you haven't heard from the office you've been referred to, please reach out to them at the phone provided.   Follow up Visits: We will see or speak with you next year for your Next Medicare AWV with our clinical staff on 12/28/24 at 12:30p.m. Have you seen your provider in the last 6 months (3 months if uncontrolled diabetes)? No  Clinician Recommendations:  Aim for 30 minutes of exercise or brisk walking, 6-8 glasses of water, and 5 servings of fruits and vegetables each day.       This is a list of the screenings recommended for you:  Health Maintenance  Topic Date Due   Cologuard (Stool DNA test)  Never done   Screening for Lung Cancer  Never done   Zoster (Shingles) Vaccine (1 of 2) Never done   COVID-19 Vaccine (4 - 2024-25 season) 01/24/2023   Medicare Annual Wellness Visit  05/07/2023   Flu Shot  12/24/2023   Pneumococcal Vaccine for age over 49 (2 of 2 - PPSV23, PCV20, or PCV21) 09/12/2024*   Hemoglobin A1C  06/11/2024   Yearly kidney health urinalysis for diabetes  09/12/2024   Complete foot exam   09/12/2024   Yearly kidney function blood test for diabetes  12/09/2024   Eye exam for diabetics  12/09/2024   DTaP/Tdap/Td vaccine (3 - Td or Tdap) 08/28/2031   Hepatitis C Screening  Completed   Hepatitis B Vaccine  Aged Out   HPV Vaccine  Aged Out   Meningitis B Vaccine  Aged Out  *Topic was postponed. The date shown is not the original due date.    Advanced directives: (Declined) Advance directive discussed with you today. Even though you declined this today,  please call our office should you change your mind, and we can give you the proper paperwork for you to fill out. Advance Care Planning is important because it:  [x]  Makes sure you receive the medical care that is consistent with your values, goals, and preferences  [x]  It provides guidance to your family and loved ones and reduces their decisional burden about whether or not they are making the right decisions based on your wishes.  Follow the link provided in your after visit summary or read over the paperwork we have mailed to you to help you started getting your Advance Directives in place. If you need assistance in completing these, please reach out to us  so that we can help you!  See attachments for Preventive Care and Fall Prevention Tips.

## 2023-12-28 NOTE — Progress Notes (Signed)
 Subjective:   Tyler Garner is a 68 y.o. who presents for a Medicare Wellness preventive visit.  As a reminder, Annual Wellness Visits don't include a physical exam, and some assessments may be limited, especially if this visit is performed virtually. We may recommend an in-person follow-up visit with your provider if needed.  Visit Complete: Virtual I connected with  Tyler Garner on 12/28/23 by a audio enabled telemedicine application and verified that I am speaking with the correct person using two identifiers.  Patient Location: Home  Provider Location: Office/Clinic  I discussed the limitations of evaluation and management by telemedicine. The patient expressed understanding and agreed to proceed.  Vital Signs: Because this visit was a virtual/telehealth visit, some criteria may be missing or patient reported. Any vitals not documented were not able to be obtained and vitals that have been documented are patient reported.  VideoDeclined- This patient declined Librarian, academic. Therefore the visit was completed with audio only.  Persons Participating in Visit: Patient.  AWV Questionnaire: No: Patient Medicare AWV questionnaire was not completed prior to this visit.  Cardiac Risk Factors include: advanced age (>19men, >44 women);diabetes mellitus;dyslipidemia;hypertension;obesity (BMI >30kg/m2);male gender     Objective:    Today's Vitals   12/28/23 1512 12/28/23 1514  BP: 129/60   Pulse: (!) 51   Weight: 215 lb (97.5 kg)   Height: 5' 10 (1.778 m)   PainSc:  10-Worst pain ever   Body mass index is 30.85 kg/m.     12/28/2023    3:18 PM 05/06/2022   10:48 AM 04/16/2021   11:23 AM 03/28/2021    7:22 AM 01/09/2021   11:29 AM 11/30/2020    5:36 PM 11/28/2020    3:10 PM  Advanced Directives  Does Patient Have a Medical Advance Directive? No Yes Yes Yes Yes No No  Type of Special educational needs teacher of [place];Living will  Healthcare Power of [place];Living will Living will     Copy of Healthcare Power of Attorney in Chart?  No - copy requested No - copy requested      Would patient like information on creating a medical advance directive?      No - Patient declined No - Patient declined    Current Medications (verified) Outpatient Encounter Medications as of 12/28/2023  Medication Sig   ACCU-CHEK GUIDE test strip CHECK BLOOD SUGAR DAILY AND AS NEEDED DX E11.9   Accu-Chek Softclix Lancets lancets Check blood sugar daily and prn  Dx E11.9   amLODipine  (NORVASC ) 5 MG tablet Take 1 tablet (5 mg total) by mouth daily.   Blood Glucose Monitoring Suppl (ACCU-CHEK GUIDE ME) w/Device KIT CHECK BLOOD SUGAR DAILY AND AS NEEDED DX E11.9   celecoxib  (CELEBREX ) 200 MG capsule Take 1 capsule (200 mg total) by mouth 2 (two) times daily.   glimepiride  (AMARYL ) 4 MG tablet Take 1 tablet (4 mg total) by mouth 2 (two) times daily.   insulin  glargine (LANTUS  SOLOSTAR) 100 UNIT/ML Solostar Pen Inject 50 Units into the skin at bedtime.   Insulin  Pen Needle (B-D ULTRAFINE III SHORT PEN) 31G X 8 MM MISC USE WITH LANTUS  SOLOSAR PEN DAILY Dx E11.9   lisinopril  (ZESTRIL ) 20 MG tablet Take 1 tablet (20 mg total) by mouth daily.   metoprolol  tartrate (LOPRESSOR ) 25 MG tablet Take 1 tablet (25 mg total) by mouth 2 (two) times daily.   ondansetron  (ZOFRAN ) 4 MG tablet Take 1 tablet (4 mg total) by mouth daily as  needed for up to 30 doses for nausea or vomiting.   rosuvastatin  (CRESTOR ) 40 MG tablet Take 0.5 tablets (20 mg total) by mouth daily.   No facility-administered encounter medications on file as of 12/28/2023.    Allergies (verified) Percocet [oxycodone -acetaminophen ]   History: Past Medical History:  Diagnosis Date   Arthritis    hands and hips oa   CAD (coronary artery disease)    a. inferior STEMI (05/2013) with RV involvement- LHC:  dist CFX occluded (Promus premier (2.5x24 mm) DES); EF 40-45% with inf HK;  b. Echo (report  pending) with EF 45-50%, lat WMA, RVE, mod RV hypokinesis    Complication of anesthesia 11/28/2020   confusion x 1 hour after 11-28-2020 back surgery   Foley catheter in place    since 11-28-2020 foley last changed 1 week ago as of 03-25-2021   History of kidney stones    Hyperlipidemia    Hypertension    Ischemic cardiomyopathy    Myocardial infarction (HCC) 2015   Nicotine addiction    Renal colic    Right shoulder injury 10/11/2009   Type 2 DM    Urinary retention    since july 2022   Past Surgical History:  Procedure Laterality Date   heart stent  06/21/2013   x 1 to distal LCX   LEFT HEART CATHETERIZATION WITH CORONARY ANGIOGRAM N/A 06/21/2013   Procedure: LEFT HEART CATHETERIZATION WITH CORONARY ANGIOGRAM;  Surgeon: Peter M Swaz[place]and, MD;  Location: [facility] CATH LAB;  Service: Cardiovascular;  Laterality: N/A;   LUMBAR LAMINECTOMY/DECOMPRESSION MICRODISCECTOMY N/A 11/28/2020   Procedure: LUMBAR DECOMPRESSION  2 LEVELS, L3 and L5;  Surgeon: Burnetta Aures, MD;  Location: [facility] OR;  Service: Orthopedics;  Laterality: N/A;  3.5 hrs   TRANSURETHRAL RESECTION OF PROSTATE N/A 03/28/2021   Procedure: TRANSURETHRAL RESECTION OF THE PROSTATE (TURP)/ BIPOLAR;  Surgeon: Selma Donnice SAUNDERS, MD;  Location: [facility];  Service: Urology;  Laterality: N/A;   Family History  Problem Relation Age of Onset   Diabetes Mother    ALS Mother    Alzheimer's disease Father    Healthy Sister    Diabetes Brother    Social History   Socioeconomic History   Marital status: Widowed    Spouse name: Not on file   Number of children: 2   Years of education: Not on file   Highest education level: Not on file  Occupational History   Occupation: Retired  Tobacco Use   Smoking status: Former    Current packs/day: 0.00    Average packs/day: 1 pack/day for 40.0 years (40.0 ttl pk-yrs)    Types: Cigarettes    Start date: 05/25/1970    Quit date: 05/25/2010    Years since quitting: 13.6   Smokeless  tobacco: Former    Quit date: 05/25/2008  Vaping Use   Vaping status: Never Used  Substance and Sexual Activity   Alcohol use: Not Currently   Drug use: No   Sexual activity: Not on file  Other Topics Concern   Not on file  Social History Narrative   Lives alone. 2 children: One in [place], another in [place]   Social Drivers of Health   Financial Resource Strain: Low Risk  (12/28/2023)   Overall Financial Resource Strain (CARDIA)    Difficulty of Paying Living Expenses: Not hard at all  Food Insecurity: No Food Insecurity (12/28/2023)   Hunger Vital Sign    Worried About Running Out of Food in the Last Year: Never  true    Ran Out of Food in the Last Year: Never true  Transportation Needs: No Transportation Needs (12/28/2023)   PRAPARE - Administrator, Civil Service (Medical): No    Lack of Transportation (Non-Medical): No  Physical Activity: Insufficiently Active (12/28/2023)   Exercise Vital Sign    Days of Exercise per Week: 7 days    Minutes of Exercise per Session: 20 min  Stress: No Stress Concern Present (12/28/2023)   [employer] of Occupational Health - Occupational Stress Questionnaire    Feeling of Stress: Not at all  Social Connections: Socially Isolated (12/28/2023)   Social Connection and Isolation Panel    Frequency of Communication with Friends and Family: More than three times a week    Frequency of Social Gatherings with Friends and Family: More than three times a week    Attends Religious Services: Never    Database administrator or Organizations: No    Attends Banker Meetings: Never    Marital Status: Widowed    Tobacco Counseling Counseling given: Yes    Clinical Intake:  Pre-visit preparation completed: Yes  Pain : 0-10 (lower back/both hips) Pain Score: 10-Worst pain ever Pain Type: Chronic pain Pain Location: Back (both hips) Pain Orientation: Right, Left, Lower Pain Descriptors / Indicators: Constant Pain Onset:  More than a month ago Pain Frequency: Constant Pain Relieving Factors: clebrax  Pain Relieving Factors: clebrax  BMI - recorded: 30.85 Nutritional Status: BMI > 30  Obese Nutritional Risks: None Diabetes: Yes CBG done?: No  Lab Results  Component Value Date   HGBA1C 6.8 (H) 12/10/2023   HGBA1C 7.5 (H) 09/13/2023   HGBA1C 7.4 (H) 05/17/2023     How often do you need to have someone help you when you read instructions, pamphlets, or other written materials from your doctor or pharmacy?: 1 - Never  Interpreter Needed?: No  Information entered by :: alia t/cma   Activities of Daily Living     12/28/2023    3:17 PM  In your present state of health, do you have any difficulty performing the following activities:  Hearing? 0  Vision? 0  Difficulty concentrating or making decisions? 0  Walking or climbing stairs? 0  Dressing or bathing? 0  Doing errands, shopping? 0  Preparing Food and eating ? N  Using the Toilet? N  In the past six months, have you accidently leaked urine? N  Do you have problems with loss of bowel control? N  Managing your Medications? N  Managing your Finances? N  Housekeeping or managing your Housekeeping? N    Patient Care Team: Gladis Mustard, [name] as PCP - General (Nurse Practitioner)  I have updated your Care Teams any recent Medical Services you may have received from other providers in the past year.     Assessment:   This is a routine wellness examination for Carney.  Hearing/Vision screen Hearing Screening - Comments:: Pt denies hearing dif Vision Screening - Comments:: Pt denies vision dif/pt has not been have his eyes check in several yrs   Goals Addressed   None    Depression Screen     12/28/2023    3:22 PM 12/10/2023    8:18 AM 09/13/2023    7:59 AM 05/17/2023   12:00 PM 11/17/2022   10:36 AM 08/18/2022   10:23 AM 05/19/2022    8:43 AM  PHQ 2/9 Scores  PHQ - 2 Score 0 0 0 0 0 0 0  PHQ- 9 Score     0 3 3    Fall Risk      12/28/2023    3:14 PM 12/10/2023    8:18 AM 09/13/2023    7:59 AM 05/17/2023   12:00 PM 11/17/2022   10:36 AM  Fall Risk   Falls in the past year? 0 0 0 0 0  Number falls in past yr: 0      Injury with Fall? 0      Risk for fall due to : No Fall Risks      Follow up Falls evaluation completed        MEDICARE RISK AT HOME:  Medicare Risk at Home Any stairs in or around the home?: Yes If so, are there any without handrails?: Yes Home free of loose throw rugs in walkways, pet beds, electrical cords, etc?: Yes Adequate lighting in your home to reduce risk of falls?: Yes Life alert?: No Use of a cane, walker or w/c?: Yes Grab bars in the bathroom?: Yes Shower chair or bench in shower?: Yes Elevated toilet seat or a handicapped toilet?: No  TIMED UP AND GO:  Was the test performed?  No  Cognitive Function: 6CIT completed        12/28/2023    3:20 PM 05/06/2022   10:48 AM 04/16/2021   11:26 AM  6CIT Screen  What Year? 0 points 0 points 0 points  What month? 0 points 0 points 0 points  What time? 0 points 0 points 0 points  Count back from 20 0 points 0 points 0 points  Months in reverse 0 points 0 points 0 points  Repeat phrase 0 points 0 points 0 points  Total Score 0 points 0 points 0 points    Immunizations Immunization History  Administered Date(s) Administered   Fluad Quad(high Dose 65+) 02/22/2020, 02/18/2021, 02/17/2022   Influenza Inj Mdck Quad Pf 03/04/2017   Influenza, High Dose Seasonal PF 03/12/2023   Influenza,inj,Quad PF,6+ Mos 02/21/2013, 04/05/2014, 05/24/2015, 04/07/2016, 03/03/2018, 01/31/2019   Influenza-Unspecified 02/22/2017   Moderna SARS-COV2 Booster Vaccination 12/04/2020   Moderna Sars-Covid-2 Vaccination 11/07/2019, 12/05/2019   Pneumococcal Conjugate-13 08/04/2016   Td 08/11/2005   Tdap 08/27/2021    Screening Tests Health Maintenance  Topic Date Due   Fecal DNA (Cologuard)  Never done   Lung Cancer Screening  Never done   Zoster  Vaccines- Shingrix (1 of 2) Never done   COVID-19 Vaccine (4 - 2024-25 season) 01/24/2023   INFLUENZA VACCINE  12/24/2023   Pneumococcal Vaccine: 50+ Years (2 of 2 - PPSV23, PCV20, or PCV21) 09/12/2024 (Originally 09/29/2016)   HEMOGLOBIN A1C  06/11/2024   Diabetic kidney evaluation - Urine ACR  09/12/2024   FOOT EXAM  09/12/2024   Diabetic kidney evaluation - eGFR measurement  12/09/2024   OPHTHALMOLOGY EXAM  12/09/2024   Medicare Annual Wellness (AWV)  12/27/2024   DTaP/Tdap/Td (3 - Td or Tdap) 08/28/2031   Hepatitis C Screening  Completed   Hepatitis B Vaccines  Aged Out   HPV VACCINES  Aged Out   Meningococcal B Vaccine  Aged Out    Health Maintenance  Health Maintenance Due  Topic Date Due   Fecal DNA (Cologuard)  Never done   Lung Cancer Screening  Never done   Zoster Vaccines- Shingrix (1 of 2) Never done   COVID-19 Vaccine (4 - 2024-25 season) 01/24/2023   INFLUENZA VACCINE  12/24/2023   Health Maintenance Items Addressed: See Nurse Notes at the end of  this note  Additional Screening:  Vision Screening: Recommended annual ophthalmology exams for early detection of glaucoma and other disorders of the eye. Would you like a referral to an eye doctor? No    Dental Screening: Recommended annual dental exams for proper oral hygiene  Community Resource Referral / Chronic Care Management: CRR required this visit?  No   CCM required this visit?  No   Plan:    I have personally reviewed and noted the following in the patient's chart:   Medical and social history Use of alcohol, tobacco or illicit drugs  Current medications and supplements including opioid prescriptions. Patient is not currently taking opioid prescriptions. Functional ability and status Nutritional status Physical activity Advanced directives List of other physicians Hospitalizations, surgeries, and ER visits in previous 12 months Vitals Screenings to include cognitive, depression, and  falls Referrals and appointments  In addition, I have reviewed and discussed with patient certain preventive protocols, quality metrics, and best practice recommendations. A written personalized care plan for preventive services as well as general preventive health recommendations were provided to patient.   Ozie Ned, CMA   12/28/2023   After Visit Summary: (Declined) Due to this being a telephonic visit, with patients personalized plan was offered to patient but patient Declined AVS at this time   Notes: Nothing significant to report at this time.

## 2023-12-30 ENCOUNTER — Other Ambulatory Visit: Payer: Self-pay

## 2023-12-30 ENCOUNTER — Telehealth: Payer: Self-pay

## 2023-12-30 DIAGNOSIS — D649 Anemia, unspecified: Secondary | ICD-10-CM

## 2023-12-30 NOTE — Telephone Encounter (Signed)
 Called and spoke with patient and asked if he would come by the office and have his hemoglobin rechecked because it was low at last visit. Patient agreed and stated he would stop by tomorrow. Future orders placed and appt scheduled for patient tomorrow

## 2023-12-30 NOTE — Telephone Encounter (Signed)
 Copied from CRM [id number]. Topic: General - Call Back - No Documentation >> Dec 30, 2023  4:25 PM Elle L wrote: Reason for CRM: The patient states he missed a call from the office but I did not see this reflected in his chart. I attempted to call the office twice but there was no answer. The patient's call back number is [phone].

## 2023-12-31 ENCOUNTER — Other Ambulatory Visit

## 2023-12-31 ENCOUNTER — Ambulatory Visit: Payer: Self-pay | Admitting: Nurse Practitioner

## 2023-12-31 DIAGNOSIS — D649 Anemia, unspecified: Secondary | ICD-10-CM | POA: Diagnosis not present

## 2023-12-31 LAB — HEMOGLOBIN, FINGERSTICK: Hemoglobin: 8.9 g/dL — ABNORMAL LOW (ref 13.0–17.7)

## 2024-01-03 ENCOUNTER — Ambulatory Visit: Payer: Self-pay | Admitting: Nurse Practitioner

## 2024-01-03 ENCOUNTER — Other Ambulatory Visit: Payer: Self-pay | Admitting: Family Medicine

## 2024-01-03 ENCOUNTER — Other Ambulatory Visit: Payer: Self-pay | Admitting: *Deleted

## 2024-01-03 ENCOUNTER — Other Ambulatory Visit

## 2024-01-03 DIAGNOSIS — D649 Anemia, unspecified: Secondary | ICD-10-CM | POA: Diagnosis not present

## 2024-01-03 LAB — HEMOGLOBIN, FINGERSTICK: Hemoglobin: 8.3 g/dL — ABNORMAL LOW (ref 13.0–17.7)

## 2024-01-05 ENCOUNTER — Other Ambulatory Visit: Payer: Self-pay | Admitting: Nurse Practitioner

## 2024-01-06 ENCOUNTER — Other Ambulatory Visit

## 2024-01-06 DIAGNOSIS — D649 Anemia, unspecified: Secondary | ICD-10-CM | POA: Diagnosis not present

## 2024-01-06 LAB — CBC WITH DIFFERENTIAL/PLATELET
Basophils Absolute: 0.1 x10E3/uL (ref 0.0–0.2)
Basos: 1 %
EOS (ABSOLUTE): 0.1 x10E3/uL (ref 0.0–0.4)
Eos: 1 %
Hematocrit: 33.5 % — ABNORMAL LOW (ref 37.5–51.0)
Hemoglobin: 9.3 g/dL — ABNORMAL LOW (ref 13.0–17.7)
Immature Grans (Abs): 0 x10E3/uL (ref 0.0–0.1)
Immature Granulocytes: 0 %
Lymphocytes Absolute: 2.2 x10E3/uL (ref 0.7–3.1)
Lymphs: 26 %
MCH: 20.1 pg — ABNORMAL LOW (ref 26.6–33.0)
MCHC: 27.8 g/dL — ABNORMAL LOW (ref 31.5–35.7)
MCV: 72 fL — ABNORMAL LOW (ref 79–97)
Monocytes Absolute: 0.8 x10E3/uL (ref 0.1–0.9)
Monocytes: 9 %
Neutrophils Absolute: 5.3 x10E3/uL (ref 1.4–7.0)
Neutrophils: 63 %
Platelets: 385 x10E3/uL (ref 150–450)
RBC: 4.63 x10E6/uL (ref 4.14–5.80)
RDW: 18.8 % — ABNORMAL HIGH (ref 11.6–15.4)
WBC: 8.5 x10E3/uL (ref 3.4–10.8)

## 2024-01-07 ENCOUNTER — Ambulatory Visit: Payer: Self-pay | Admitting: Nurse Practitioner

## 2024-01-07 DIAGNOSIS — R310 Gross hematuria: Secondary | ICD-10-CM | POA: Diagnosis not present

## 2024-01-07 DIAGNOSIS — K429 Umbilical hernia without obstruction or gangrene: Secondary | ICD-10-CM | POA: Diagnosis not present

## 2024-01-07 DIAGNOSIS — K8020 Calculus of gallbladder without cholecystitis without obstruction: Secondary | ICD-10-CM | POA: Diagnosis not present

## 2024-01-21 ENCOUNTER — Other Ambulatory Visit: Payer: Self-pay

## 2024-01-21 ENCOUNTER — Encounter (HOSPITAL_COMMUNITY): Payer: Self-pay

## 2024-01-21 ENCOUNTER — Observation Stay (HOSPITAL_COMMUNITY)
Admission: EM | Admit: 2024-01-21 | Discharge: 2024-01-22 | Disposition: A | Discharge status: Home Health | Attending: Family Medicine | Admitting: Family Medicine

## 2024-01-21 ENCOUNTER — Emergency Department (HOSPITAL_COMMUNITY)

## 2024-01-21 DIAGNOSIS — Z79899 Other long term (current) drug therapy: Secondary | ICD-10-CM | POA: Insufficient documentation

## 2024-01-21 DIAGNOSIS — Z794 Long term (current) use of insulin: Secondary | ICD-10-CM | POA: Diagnosis not present

## 2024-01-21 DIAGNOSIS — N10 Acute tubulo-interstitial nephritis: Secondary | ICD-10-CM | POA: Diagnosis present

## 2024-01-21 DIAGNOSIS — R531 Weakness: Secondary | ICD-10-CM | POA: Diagnosis not present

## 2024-01-21 DIAGNOSIS — Z87891 Personal history of nicotine dependence: Secondary | ICD-10-CM | POA: Insufficient documentation

## 2024-01-21 DIAGNOSIS — E162 Hypoglycemia, unspecified: Principal | ICD-10-CM | POA: Diagnosis present

## 2024-01-21 DIAGNOSIS — I259 Chronic ischemic heart disease, unspecified: Secondary | ICD-10-CM | POA: Insufficient documentation

## 2024-01-21 DIAGNOSIS — D509 Iron deficiency anemia, unspecified: Secondary | ICD-10-CM | POA: Diagnosis present

## 2024-01-21 DIAGNOSIS — M6281 Muscle weakness (generalized): Secondary | ICD-10-CM | POA: Diagnosis not present

## 2024-01-21 DIAGNOSIS — N401 Enlarged prostate with lower urinary tract symptoms: Secondary | ICD-10-CM | POA: Insufficient documentation

## 2024-01-21 DIAGNOSIS — K429 Umbilical hernia without obstruction or gangrene: Secondary | ICD-10-CM | POA: Diagnosis not present

## 2024-01-21 DIAGNOSIS — E1169 Type 2 diabetes mellitus with other specified complication: Secondary | ICD-10-CM | POA: Insufficient documentation

## 2024-01-21 DIAGNOSIS — I10 Essential (primary) hypertension: Secondary | ICD-10-CM | POA: Diagnosis not present

## 2024-01-21 DIAGNOSIS — E11649 Type 2 diabetes mellitus with hypoglycemia without coma: Secondary | ICD-10-CM | POA: Diagnosis not present

## 2024-01-21 DIAGNOSIS — N12 Tubulo-interstitial nephritis, not specified as acute or chronic: Secondary | ICD-10-CM

## 2024-01-21 DIAGNOSIS — E11638 Type 2 diabetes mellitus with other oral complications: Secondary | ICD-10-CM | POA: Insufficient documentation

## 2024-01-21 DIAGNOSIS — I252 Old myocardial infarction: Secondary | ICD-10-CM | POA: Insufficient documentation

## 2024-01-21 DIAGNOSIS — Z7984 Long term (current) use of oral hypoglycemic drugs: Secondary | ICD-10-CM | POA: Insufficient documentation

## 2024-01-21 DIAGNOSIS — Z7982 Long term (current) use of aspirin: Secondary | ICD-10-CM | POA: Diagnosis not present

## 2024-01-21 DIAGNOSIS — E785 Hyperlipidemia, unspecified: Secondary | ICD-10-CM | POA: Insufficient documentation

## 2024-01-21 DIAGNOSIS — R55 Syncope and collapse: Secondary | ICD-10-CM | POA: Diagnosis not present

## 2024-01-21 DIAGNOSIS — N179 Acute kidney failure, unspecified: Secondary | ICD-10-CM | POA: Diagnosis not present

## 2024-01-21 DIAGNOSIS — N400 Enlarged prostate without lower urinary tract symptoms: Secondary | ICD-10-CM | POA: Diagnosis not present

## 2024-01-21 DIAGNOSIS — E611 Iron deficiency: Secondary | ICD-10-CM | POA: Diagnosis present

## 2024-01-21 DIAGNOSIS — I240 Acute coronary thrombosis not resulting in myocardial infarction: Secondary | ICD-10-CM | POA: Diagnosis present

## 2024-01-21 DIAGNOSIS — K922 Gastrointestinal hemorrhage, unspecified: Secondary | ICD-10-CM | POA: Diagnosis present

## 2024-01-21 DIAGNOSIS — K8020 Calculus of gallbladder without cholecystitis without obstruction: Secondary | ICD-10-CM | POA: Diagnosis not present

## 2024-01-21 DIAGNOSIS — E119 Type 2 diabetes mellitus without complications: Secondary | ICD-10-CM | POA: Insufficient documentation

## 2024-01-21 LAB — COMPREHENSIVE METABOLIC PANEL WITH GFR
ALT: 16 U/L (ref 0–44)
AST: 30 U/L (ref 15–41)
Albumin: 3.1 g/dL — ABNORMAL LOW (ref 3.5–5.0)
Alkaline Phosphatase: 75 U/L (ref 38–126)
Anion gap: 17 — ABNORMAL HIGH (ref 5–15)
BUN: 46 mg/dL — ABNORMAL HIGH (ref 8–23)
CO2: 21 mmol/L — ABNORMAL LOW (ref 22–32)
Calcium: 8.5 mg/dL — ABNORMAL LOW (ref 8.9–10.3)
Chloride: 98 mmol/L (ref 98–111)
Creatinine, Ser: 1.73 mg/dL — ABNORMAL HIGH (ref 0.61–1.24)
GFR, Estimated: 42 mL/min — ABNORMAL LOW (ref >60)
Glucose, Bld: 85 mg/dL (ref 70–99)
Potassium: 3.8 mmol/L (ref 3.5–5.1)
Sodium: 136 mmol/L (ref 135–145)
Total Bilirubin: 0.6 mg/dL (ref 0.0–1.2)
Total Protein: 6.9 g/dL (ref 6.5–8.1)

## 2024-01-21 LAB — CBC WITH DIFFERENTIAL/PLATELET
Abs Immature Granulocytes: 0.05 K/uL (ref 0.00–0.07)
Basophils Absolute: 0 K/uL (ref 0.0–0.1)
Basophils Relative: 0 %
Eosinophils Absolute: 0 K/uL (ref 0.0–0.5)
Eosinophils Relative: 0 %
HCT: 32.3 % — ABNORMAL LOW (ref 39.0–52.0)
Hemoglobin: 9.7 g/dL — ABNORMAL LOW (ref 13.0–17.0)
Immature Granulocytes: 0 %
Lymphocytes Relative: 6 %
Lymphs Abs: 0.7 K/uL (ref 0.7–4.0)
MCH: 22.2 pg — ABNORMAL LOW (ref 26.0–34.0)
MCHC: 30 g/dL (ref 30.0–36.0)
MCV: 73.9 fL — ABNORMAL LOW (ref 80.0–100.0)
Monocytes Absolute: 0.7 K/uL (ref 0.1–1.0)
Monocytes Relative: 6 %
Neutro Abs: 10.1 K/uL — ABNORMAL HIGH (ref 1.7–7.7)
Neutrophils Relative %: 88 %
Platelets: 225 K/uL (ref 150–400)
RBC: 4.37 MIL/uL (ref 4.22–5.81)
RDW: 25.1 % — ABNORMAL HIGH (ref 11.5–15.5)
WBC: 11.7 K/uL — ABNORMAL HIGH (ref 4.0–10.5)
nRBC: 0 % (ref 0.0–0.2)

## 2024-01-21 LAB — URINALYSIS, ROUTINE W REFLEX MICROSCOPIC
Bilirubin Urine: NEGATIVE
Glucose, UA: NEGATIVE mg/dL
Ketones, ur: NEGATIVE mg/dL
Nitrite: NEGATIVE
Protein, ur: 30 mg/dL — AB
Specific Gravity, Urine: 1.006 (ref 1.005–1.030)
pH: 5 (ref 5.0–8.0)

## 2024-01-21 LAB — GLUCOSE, CAPILLARY: Glucose-Capillary: 138 mg/dL — ABNORMAL HIGH (ref 70–99)

## 2024-01-21 LAB — CBG MONITORING, ED
Glucose-Capillary: 90 mg/dL (ref 70–99)
Glucose-Capillary: 94 mg/dL (ref 70–99)
Glucose-Capillary: 63 mg/dL — ABNORMAL LOW (ref 70–99)

## 2024-01-21 LAB — LACTIC ACID, PLASMA: Lactic Acid, Venous: 1.5 mmol/L (ref 0.5–1.9)

## 2024-01-21 LAB — TROPONIN I (HIGH SENSITIVITY)
Troponin I (High Sensitivity): 11 ng/L (ref <18)
Troponin I (High Sensitivity): 12 ng/L (ref <18)

## 2024-01-21 LAB — CK: Total CK: 373 U/L (ref 49–397)

## 2024-01-21 LAB — MAGNESIUM: Magnesium: 1.9 mg/dL (ref 1.7–2.4)

## 2024-01-21 MED ORDER — PANTOPRAZOLE SODIUM 40 MG PO TBEC
40.0000 mg | DELAYED_RELEASE_TABLET | Freq: Two times a day (BID) | ORAL | Status: DC
  Administered 2024-01-21 – 2024-01-22 (×2): 40 mg via ORAL
  Filled 2024-01-21 (×2): qty 1

## 2024-01-21 MED ORDER — CHLORHEXIDINE GLUCONATE CLOTH 2 % EX PADS: 6.0000 | MEDICATED_PAD | Freq: Every day | CUTANEOUS | Status: DC

## 2024-01-21 MED ORDER — ONDANSETRON HCL 4 MG/2ML IJ SOLN: 4.0000 mg | Freq: Four times a day (QID) | INTRAMUSCULAR | Status: DC | PRN

## 2024-01-21 MED ORDER — SODIUM CHLORIDE 0.9 % IV SOLN: 2.0000 g | INTRAVENOUS | Status: DC

## 2024-01-21 MED ORDER — SODIUM CHLORIDE 0.9% FLUSH
3.0000 mL | Freq: Two times a day (BID) | INTRAVENOUS | Status: DC
  Administered 2024-01-21 – 2024-01-22 (×2): 3 mL via INTRAVENOUS

## 2024-01-21 MED ORDER — ALBUTEROL SULFATE (2.5 MG/3ML) 0.083% IN NEBU: 2.5000 mg | INHALATION_SOLUTION | RESPIRATORY_TRACT | Status: DC | PRN

## 2024-01-21 MED ORDER — METOPROLOL TARTRATE 25 MG PO TABS
25.0000 mg | ORAL_TABLET | Freq: Two times a day (BID) | ORAL | Status: DC
  Administered 2024-01-21 – 2024-01-22 (×2): 25 mg via ORAL
  Filled 2024-01-21 (×2): qty 1

## 2024-01-21 MED ORDER — MELATONIN 3 MG PO TABS: 6.0000 mg | ORAL_TABLET | Freq: Every evening | ORAL | Status: DC | PRN

## 2024-01-21 MED ORDER — ROSUVASTATIN CALCIUM 20 MG PO TABS
20.0000 mg | ORAL_TABLET | Freq: Every day | ORAL | Status: DC
Start: 2024-01-22 — End: 2024-01-22
  Administered 2024-01-22: 20 mg via ORAL
  Filled 2024-01-21: qty 1

## 2024-01-21 MED ORDER — PANTOPRAZOLE SODIUM 40 MG IV SOLR: 40.0000 mg | Freq: Two times a day (BID) | INTRAVENOUS | Status: DC

## 2024-01-21 MED ORDER — TAMSULOSIN HCL 0.4 MG PO CAPS
0.4000 mg | ORAL_CAPSULE | Freq: Every day | ORAL | Status: DC
  Administered 2024-01-21: 0.4 mg via ORAL
  Filled 2024-01-21: qty 1

## 2024-01-21 MED ORDER — ACETAMINOPHEN 500 MG PO TABS: 1000.0000 mg | ORAL_TABLET | Freq: Four times a day (QID) | ORAL | Status: DC | PRN

## 2024-01-21 MED ORDER — SODIUM CHLORIDE 0.9 % IV SOLN: 2.0000 g | Freq: Once | INTRAVENOUS | Status: DC

## 2024-01-21 MED ORDER — POLYETHYLENE GLYCOL 3350 17 G PO PACK: 17.0000 g | PACK | Freq: Every day | ORAL | Status: DC | PRN

## 2024-01-21 MED ORDER — LACTATED RINGERS IV BOLUS: 1000.0000 mL | Freq: Once | INTRAVENOUS | Status: DC

## 2024-01-21 MED ORDER — DEXTROSE IN LACTATED RINGERS 5 % IV SOLN
INTRAVENOUS | Status: DC
  Administered 2024-01-21: 1000 mL via INTRAVENOUS

## 2024-01-21 MED ORDER — DEXTROSE 50 % IV SOLN
1.0000 | Freq: Once | INTRAVENOUS | Status: AC
  Administered 2024-01-21: 50 mL via INTRAVENOUS
  Filled 2024-01-21: qty 50

## 2024-01-21 MED ORDER — ASPIRIN 81 MG PO TBEC
81.0000 mg | DELAYED_RELEASE_TABLET | Freq: Every day | ORAL | Status: DC
Start: 2024-01-22 — End: 2024-01-22
  Administered 2024-01-22: 81 mg via ORAL
  Filled 2024-01-21: qty 1

## 2024-01-21 MED ORDER — ENOXAPARIN SODIUM 40 MG/0.4ML IJ SOSY: 40.0000 mg | PREFILLED_SYRINGE | INTRAMUSCULAR | Status: DC

## 2024-01-21 MED ORDER — BUTALBITAL-APAP-CAFFEINE 50-325-40 MG PO TABS
1.0000 | ORAL_TABLET | Freq: Once | ORAL | Status: AC
  Administered 2024-01-21: 1 via ORAL
  Filled 2024-01-21: qty 1

## 2024-01-21 NOTE — ED Notes (Signed)
 Patient transported to CT

## 2024-01-21 NOTE — H&P (Signed)
 History and Physical    Tyler Garner FMW:[id number] DOB: 1955/06/04 DOA: 01/21/2024  PCP: Gladis Mustard, [name]   Patient coming from: Home   Chief Complaint:  Chief Complaint  Patient presents with   Hypoglycemia    HPI:  Tyler Garner is a 68 y.o. male with hx of CAD with hx PCI, HTN, HLD, DM type 2, BPH hx TURP, obesity, hx lumbar laminectomy, who was brought in by EMS after found down at home., noted to have hypoglycemia at 57 treated with D10 by EMS. Patient reports in normal state of health till Sunday 8/23 and developed R flank sharp pain, and since then has been sore in the R flank. Thought it may have been a kidney stone although did not see one pass. Then over the past 3-4 days has noted generalized weakness, decreased appetite and PO intake, has not taken any medications (including insulin  and sulfonylurea over this time frame). Today tried to get up but unable and ended up falling onto the ground. No injuries noted. He reports being confused earlier but this has resolved. Otherwise notes that he has chronic dark stools x 6 months and was taking regular goody powders which he stopped because he was having epigastric pain associated. Today noted scant amount of bright red blood in his stool x 1 episode.    Review of Systems:  ROS complete and negative except as marked above   Allergies  Allergen Reactions   Percocet [Oxycodone -Acetaminophen ] Nausea And Vomiting    Prior to Admission medications   Medication Sig Start Date End Date Taking? Authorizing Provider  amLODipine  (NORVASC ) 5 MG tablet Take 1 tablet (5 mg total) by mouth daily. 12/10/23  Yes Gladis, [name], [name]  aspirin  EC 81 MG tablet Take 81 mg by mouth daily. Swallow whole.   Yes [provider]  celecoxib  (CELEBREX ) 200 MG capsule Take 1 capsule (200 mg total) by mouth 2 (two) times daily. 12/10/23  Yes Gladis, [name], [name]  ferrous sulfate  325 (65 FE) MG tablet Take 325 mg by mouth  daily with breakfast.   Yes [provider]  glimepiride  (AMARYL ) 4 MG tablet Take 1 tablet (4 mg total) by mouth 2 (two) times daily. 12/10/23  Yes Gladis, [name], [name]  insulin  glargine (LANTUS  SOLOSTAR) 100 UNIT/ML Solostar Pen Inject 50 Units into the skin at bedtime. Patient taking differently: Inject 50 Units into the skin at bedtime as needed (Only takes for levels over 100). 12/10/23  Yes Martin, [name], [name]  lisinopril  (ZESTRIL ) 20 MG tablet Take 1 tablet (20 mg total) by mouth daily. 12/10/23  Yes Gladis, [name], [name]  metoprolol  tartrate (LOPRESSOR ) 25 MG tablet Take 1 tablet (25 mg total) by mouth 2 (two) times daily. 12/10/23  Yes Gladis, [name], [name]  Multiple Vitamin (MULTIVITAMIN WITH MINERALS) TABS tablet Take 1 tablet by mouth daily.   Yes [provider]  rosuvastatin  (CRESTOR ) 40 MG tablet Take 0.5 tablets (20 mg total) by mouth daily. 12/10/23  Yes Martin, [name], [name]  tamsulosin  (FLOMAX ) 0.4 MG CAPS capsule Take 0.4 mg by mouth at bedtime. 12/27/23  Yes [provider]    Past Medical History:  Diagnosis Date   Arthritis    hands and hips oa   CAD (coronary artery disease)    a. inferior STEMI (05/2013) with RV involvement- LHC:  dist CFX occluded (Promus premier (2.5x24 mm) DES); EF 40-45% with inf HK;  b. Echo (report pending) with EF 45-50%, lat WMA, RVE, mod RV hypokinesis  Complication of anesthesia 11/28/2020   confusion x 1 hour after 11-28-2020 back surgery   Foley catheter in place    since 11-28-2020 foley last changed 1 week ago as of 03-25-2021   History of kidney stones    Hyperlipidemia    Hypertension    Ischemic cardiomyopathy    Myocardial infarction [facility]) 2015   Nicotine addiction    Renal colic    Right shoulder injury 10/11/2009   Type 2 DM    Urinary retention    since july 2022    Past Surgical History:  Procedure Laterality Date   heart stent  06/21/2013   x 1 to distal LCX   LEFT HEART  CATHETERIZATION WITH CORONARY ANGIOGRAM N/A 06/21/2013   Procedure: LEFT HEART CATHETERIZATION WITH CORONARY ANGIOGRAM;  Surgeon: Peter M [place]az[place], MD;  Location: [facility] CATH LAB;  Service: Cardiovascular;  Laterality: N/A;   LUMBAR LAMINECTOMY/DECOMPRESSION MICRODISCECTOMY N/A 11/28/2020   Procedure: LUMBAR DECOMPRESSION  2 LEVELS, L3 and L5;  Surgeon: Burnetta Aures, MD;  Location: [facility] OR;  Service: Orthopedics;  Laterality: N/A;  3.5 hrs   TRANSURETHRAL RESECTION OF PROSTATE N/A 03/28/2021   Procedure: TRANSURETHRAL RESECTION OF THE PROSTATE (TURP)/ BIPOLAR;  Surgeon: Tyler Donnice SAUNDERS, MD;  Location[facility];  Service: Urology;  Laterality: N/A;     reports that he quit smoking about 13 years ago. His smoking use included cigarettes. He started smoking about 53 years ago. He has a 40 pack-year smoking history. He quit smokeless tobacco use about 15 years ago. He reports that he does not currently use alcohol. He reports that he does not use drugs.  Family History  Problem Relation Age of Onset   Diabetes Mother    ALS Mother    Alzheimer's disease Father    Healthy Sister    Diabetes Brother      Physical Exam: Vitals:   01/21/24 1748 01/21/24 1749 01/21/24 1900 01/21/24 2015  BP: (!) 145/59  123/69 118/63  Pulse: (!) 52  94 98  Resp: 19  (!) 28 (!) 24  Temp: 99.3 F (37.4 C)     TempSrc: Oral     SpO2: 98%  98% 97%  Weight:  95.3 kg    Height:  5' 10 (1.778 m)      Gen: Awake, alert, NAD   CV: Regular, normal S1, S2, no murmurs  Resp: Normal WOB, CTAB  Abd: Round, normoactive, nontender MSK: Symmetric, 1+ edema, symmetric  Skin: No rashes or lesions to exposed skin  Neuro: Alert and interactive  Psych: euthymic, appropriate    Data review:   Labs reviewed, notable for:   BG 94 -> 63  Cr 1.73 b/l ~1  Bicarb 21, AG 17  CK 373  Hs trop 11  WBC 11  Hb 9.7 similar to prior, microcytic  UA grossly consistent with infection   Micro:  Results for orders  placed or performed in visit on 03/21/21  SARS Coronavirus 2 (TAT 6-24 hrs)     Status: None   Collection Time: 03/21/21 12:00 AM  Result Value Ref Range Status   SARS Coronavirus 2 RESULT: NEGATIVE  Final    Comment: RESULT: NEGATIVESARS-CoV-2 INTERPRETATION:A NEGATIVE  test result means that SARS-CoV-2 RNA was not present in the specimen above the limit of detection of this test. This does not preclude a possible SARS-CoV-2 infection and should not be used as the  sole basis for patient management decisions. Negative results must be combined with clinical  observations, patient history, and epidemiological information. Optimum specimen types and timing for peak viral levels during infections caused by SARS-CoV-2  have not been determined. Collection of multiple specimens or types of specimens may be necessary to detect virus. Improper specimen collection and handling, sequence variability under primers/probes, or organism present below the limit of detection may  lead to false negative results. Positive and negative predictive values of testing are highly dependent on prevalence. False negative test results are more likely when prevalence of disease is high.The expected result is NEGATIVE.Fact S heet for  Healthcare Providers: [employer] Sheet for Patients: https://poole-freeman.org/ Reference Range - Negative     Imaging reviewed:  CT Head Wo Contrast Result Date: 01/21/2024 CLINICAL DATA:  Syncope/presyncope, cerebrovascular cause suspected EXAM: CT HEAD WITHOUT CONTRAST TECHNIQUE: Contiguous axial images were obtained from the base of the skull through the vertex without intravenous contrast. RADIATION DOSE REDUCTION: This exam was performed according to the departmental dose-optimization program which includes automated exposure control, adjustment of the mA and/or kV according to patient size and/or use of iterative reconstruction  technique. COMPARISON:  None Available. FINDINGS: Brain: No evidence of acute infarction, hemorrhage, hydrocephalus, extra-axial collection or mass lesion/mass effect. Vascular: No hyperdense vessel or unexpected calcification. Skull: Normal. Negative for fracture or focal lesion. Sinuses/Orbits: No acute finding. Other: Mastoid air cells and middle ear cavities are clear. IMPRESSION: 1. No acute intracranial abnormality. Electronically Signed   By: Dorethia Molt M.D.   On: 01/21/2024 20:20   CT Renal Stone Study Result Date: 01/21/2024 CLINICAL DATA:  Found down, anorexia, incontinence EXAM: CT ABDOMEN AND PELVIS WITHOUT CONTRAST TECHNIQUE: Multidetector CT imaging of the abdomen and pelvis was performed following the standard protocol without IV contrast. RADIATION DOSE REDUCTION: This exam was performed according to the departmental dose-optimization program which includes automated exposure control, adjustment of the mA and/or kV according to patient size and/or use of iterative reconstruction technique. COMPARISON:  None Available. FINDINGS: Lower chest: Visualized lung bases are clear. Extensive coronary artery calcification. Calcification of the aortic valve leaflets. Hypoattenuation of the cardiac blood pool in keeping with at least mild anemia. Cardiac size within normal limits Hepatobiliary: Cholelithiasis without superimposed pericholecystic inflammatory change. Liver unremarkable on this noncontrast examination. No intra or extrahepatic biliary ductal dilation. Pancreas: Unremarkable Spleen: Unremarkable Adrenals/Urinary Tract: Adrenal glands are unremarkable. Kidneys are normal in size and position. Progressive right perinephric stranding suggesting unilateral acute on chronic inflammatory process such as pyelonephritis. No hydronephrosis. Stable punctate 1-2 mm nonobstructing calculus within the upper pole the right kidney. No ureteral calculi. Bladder unremarkable. Stomach/Bowel: Stomach is within  normal limits. Appendix appears normal. No evidence of bowel wall thickening, distention, or inflammatory changes. Vascular/Lymphatic: Aortic atherosclerosis. No enlarged abdominal or pelvic lymph nodes. Reproductive: TURP defect within the central prostate gland. The prostate gland seminal vesicles are otherwise unremarkable. Other: Tiny fat containing umbilical hernia. No abdominopelvic ascites. Musculoskeletal: No acute bone abnormality. No lytic or blastic bone lesion. Bilateral laminectomy and posterior decompression L4. Multifactorial severe central canal stenosis at L3 (53/2) not well characterized on this examination. Advanced degenerative changes noted lumbar spine at L3-4. IMPRESSION: 1. Progressive right perinephric stranding suggesting unilateral acute on chronic inflammatory process such as pyelonephritis. Correlation with urinalysis and urine culture may be helpful. No hydronephrosis. 2. Stable punctate 1-2 mm nonobstructing calculus within the upper pole the right kidney. 3. Cholelithiasis. 4. Extensive coronary artery calcification.  Valvular calcification 5. Multifactorial severe central canal stenosis at L3 not well characterized on this examination. This  could be better assessed with MRI examination if indicated. 6. Aortic atherosclerosis. Aortic Atherosclerosis (ICD10-I70.0). Electronically Signed   By: Dorethia Molt M.D.   On: 01/21/2024 20:18    EKG:  Personally reviewed, SR with PVC, small inferior q waves, no acute ischemic changes.    ED Course:  Treated with Ceftriaxone 2 g IV, 1 L LR, amp D50, and started on D5LR at 100 / hr,    Assessment/Plan:  68 y.o. male with hx CAD with hx PCI, HTN, HLD, DM type 2, BPH hx TURP, obesity, hx lumbar laminectomy, who was brought in by EMS after found down at home after apparent GLF, found to be hypoglycemic, and on further evaluation likely pyelonephritis.    Ground level fall, generalized weakness  Reported AMS - resolved, likely 2/2  hypoglycemia  BG 58 at home, improved with D10 but having recurrent / borderline hypoglycemia in the ED and starting on D5 rate. Likely related to underlying infection. CT head negative for any acute process.  -- Continue D5 LR at 100 cc/hr  -- Serial CBG and hypoglycemia protocol  -- PT evaluation   Pyelonephritis  Borderline Sepsis, present on admission, likely secondary to above  On initial evaluation tachypneic in 20s, WBC 11. No other SIRS. Lactate pending. Hypoglycemia possibly manifestation of infection. UA suggestive of infection. CT A/P with progressive R renal stranding suggestive of pyelonephritis, punctate 1-2 mm nonobstructing stones in upper pole R kidney.  -- Continue on Ceftriaxone 2 g IV q 24 hr for now  -- S/p 1 L IVF, check lactate, additional bolus if elevated, continue on mIVF per above  -- Check blood cultures and urine culture   Acute kidney injury stage I  Baseline Cr ~ 1, elevated to 1.7 on admission, likely prerenal in setting of infection / down time. No rhabdo, -- IVF per above  -- Check PVR  -- holding lisinopril    ? Chronic slow UGIB v stool changes form iron  ? Scant lower GI bleed, resolved  Chronic Microcytic anemia, chronic suspect iron deficiency  Hx epigastric pain and NSAID use, has stopped nonspecific NSAID though using Celebrex  for arthritis. Dark stool x 6 months. Today had a single episode of bright red blood in his stool. Hb is 9.7 with MCV 73  -- Start on Pantoprazole  40 mg PO BID for suspected PUD / chronic UGIB possibly. He should see GI as outpatient for endoscopy unless he has evidence of active bleeding in which case we can expedite.  -- Check iron panel, trend CBC  -- Advised to avoid NSIADs, hold celebrex  for now  -- With his poor intake, temporarily pause PO iron, can resume once taking better POs   Incidental findings:  Severe central canal stenosis at L3: Exam with intact strength and sensation in lower ext; reports chronic subjective  weakness, discussed this finding with the patient and recommend for MRI L spine and f/u with NSGY as outpatient.  Aortic atherosclerosis  Cholelithiasis   Chronic medical problems:  CAD with hx PCI: Continue home aspirin  Ischemic cardiomyopathy, mrEF: Noted, without exacerbation. Not on diuretic outpatient.  HTN: Hold his home Amlodipine  and Lisinopril . Continue Metoprolol   HLD: Continue home Rosuvastatin   DM type 2: Hold his home Glimepiride  and Glargine for now; monitor CBG, resume with reduced dosing and may want to consider temporary discontinuation of sulfonylurea with recent hypoglycemia  BPH hx TURP: Continue home Tamsulosin   Obesity: Noted, would benefit from weight loss outpatient  hx lumbar laminectomy: Noted; incidental finding  of L3 stenosis per above   Body mass index is 30.13 kg/m.    DVT prophylaxis:  SCD Code Status:  Full Code Diet:  Diet Orders (From admission, onward)    None      Family Communication:  None   Consults:  None   Admission status:   Observation, Med-Surg  Severity of Illness: The appropriate patient status for this patient is OBSERVATION. Observation status is judged to be reasonable and necessary in order to provide the required intensity of service to ensure the patient's safety. The patient's presenting symptoms, physical exam findings, and initial radiographic and laboratory data in the context of their medical condition is felt to place them at decreased risk for further clinical deterioration. Furthermore, it is anticipated that the patient will be medically stable for discharge from the hospital within 2 midnights of admission.    Dorn Dawson, MD [facility]  How to contact the [facility] Attending or Consulting provider 7A - 7P or covering provider during after hours 7P -7A, for this patient.  Check the care team in [facility] and look for a) attending/consulting [facility] provider listed and b) the [facility] team listed Log into [email or web address] and  use [facility] universal password to access. If you do not have the password, please contact the hospital operator. Locate the [facility] provider you are looking for under [facility] and page to a number that you can be directly reached. If you still have difficulty reaching the provider, please page the [facility] (Director on Call) for the Hospitalists listed on amion for assistance.  01/21/2024, 9:12 PM

## 2024-01-21 NOTE — ED Triage Notes (Signed)
 Pt arrives from home via EMS after family member called finding patient in floor of home. Pt states that he was on the floor for about an hour prior to family getting there and denies hitting head but does have an abrasion to left forehead. Pt reports decreased PO intake over the last 4 days. Upon EMS arrival CBG 58, EMS gave D10 with CBG going up to 168 and also gave 500 NS via 20 g right FA. Pt A&O X4 upon arrival and able to stand to get in wheelchair to go to bathroom. Brief from home was full and patient reports recent incontinence that is new for him.

## 2024-01-21 NOTE — ED Notes (Signed)
 ED Provider at bedside.

## 2024-01-21 NOTE — ED Provider Notes (Signed)
 [place] [facility] DEPARTMENT AT [facility] Provider Note   CSN: [id number] Arrival date & time: 01/21/24  1716     Patient presents with: Hypoglycemia   TIPTON BALLOW is a 68 y.o. male.   Patient is a 68 year old male who presents emergency department with his 2 sons secondary to hypoglycemia.  Patient notes that his family found him lying on the ground and he is unsure of exactly how long he was there.  Patient notes that he has been experiencing ongoing abdominal and flank pain for the past week and believes that he may have a kidney stone.  He notes that he has had no associated nausea, vomiting, diarrhea.  He notes that he has not eaten much over the past few days and notes that he has not taken his diabetic medications today.  He denies any associated chest pain, shortness of breath, palpitations at this point.  He does admit to constipation.  EMS did give D10 and route with improvement in his glucose.   Hypoglycemia      Prior to Admission medications   Medication Sig Start Date End Date Taking? Authorizing Provider  ACCU-CHEK GUIDE TEST test strip CHECK BLOOD SUGAR DAILY AND AS NEEDED DX E11.9 01/05/24   Gladis Mustard, [name]  Accu-Chek Softclix Lancets lancets Check blood sugar daily and prn  Dx E11.9 01/09/21   Gladis, [name], [name]  amLODipine  (NORVASC ) 5 MG tablet Take 1 tablet (5 mg total) by mouth daily. 12/10/23   Gladis Mustard, [name]  Blood Glucose Monitoring Suppl (ACCU-CHEK GUIDE ME) w/Device KIT CHECK BLOOD SUGAR DAILY AND AS NEEDED DX E11.9 02/06/21   Gladis, [name], [name]  celecoxib  (CELEBREX ) 200 MG capsule Take 1 capsule (200 mg total) by mouth 2 (two) times daily. 12/10/23   Gladis [name], [name]  glimepiride  (AMARYL ) 4 MG tablet Take 1 tablet (4 mg total) by mouth 2 (two) times daily. 12/10/23   Gladis Mustard, [name]  insulin  glargine (LANTUS  SOLOSTAR) 100 UNIT/ML Solostar Pen Inject 50 Units into the skin at bedtime.  12/10/23   Gladis [name], [name]  Insulin  Pen Needle (B-D ULTRAFINE III SHORT PEN) 31G X 8 MM MISC USE WITH LANTUS  SOLOSAR PEN DAILY Dx E11.9 06/28/23   Gladis, [name], [name]  lisinopril  (ZESTRIL ) 20 MG tablet Take 1 tablet (20 mg total) by mouth daily. 12/10/23   Gladis Mustard, [name]  metoprolol  tartrate (LOPRESSOR ) 25 MG tablet Take 1 tablet (25 mg total) by mouth 2 (two) times daily. 12/10/23   Gladis [name], [name]  ondansetron  (ZOFRAN ) 4 MG tablet Take 1 tablet (4 mg total) by mouth daily as needed for up to 30 doses for nausea or vomiting. 03/28/21   Selma Donnice JONELLE, MD  rosuvastatin  (CRESTOR ) 40 MG tablet Take 0.5 tablets (20 mg total) by mouth daily. 12/10/23   Gladis Mustard, [name]    Allergies: Percocet [oxycodone -acetaminophen ]    Review of Systems  Constitutional:  Positive for fatigue.  Gastrointestinal:  Positive for abdominal pain.  Genitourinary:  Positive for flank pain.  All other systems reviewed and are negative.   Updated Vital Signs BP (!) 145/59   Pulse (!) 52   Temp 99.3 F (37.4 C) (Oral)   Resp 19   Ht 5' 10 (1.778 m)   Wt 95.3 kg   SpO2 98%   BMI 30.13 kg/m   Physical Exam Vitals and nursing note reviewed.  Constitutional:      General: He is not in acute distress.    Appearance: Normal appearance.  He is not ill-appearing.  HENT:     Head: Normocephalic and atraumatic.     Nose: Nose normal.     Mouth/Throat:     Mouth: Mucous membranes are moist.  Eyes:     Extraocular Movements: Extraocular movements intact.     Conjunctiva/sclera: Conjunctivae normal.     Pupils: Pupils are equal, round, and reactive to light.  Cardiovascular:     Rate and Rhythm: Normal rate and regular rhythm.     Pulses: Normal pulses.     Heart sounds: Normal heart sounds.  Pulmonary:     Effort: Pulmonary effort is normal. No respiratory distress.     Breath sounds: Normal breath sounds. No stridor. No wheezing, rhonchi or rales.  Abdominal:      General: Abdomen is flat. Bowel sounds are normal. There is no distension.     Palpations: Abdomen is soft.     Tenderness: There is no abdominal tenderness. There is no guarding.  Musculoskeletal:        General: Normal range of motion.     Cervical back: Normal range of motion and neck supple. No rigidity or tenderness.  Skin:    General: Skin is warm and dry.  Neurological:     General: No focal deficit present.     Mental Status: He is alert and oriented to person, place, and time. Mental status is at baseline.     Cranial Nerves: No cranial nerve deficit.     Sensory: No sensory deficit.     Motor: No weakness.     Coordination: Coordination normal.     Gait: Gait normal.  Psychiatric:        Mood and Affect: Mood normal.        Behavior: Behavior normal.        Thought Content: Thought content normal.        Judgment: Judgment normal.     (all labs ordered are listed, but only abnormal results are displayed) Labs Reviewed  CBC WITH DIFFERENTIAL/PLATELET - Abnormal; Notable for the following components:      Result Value   WBC 11.7 (*)    Hemoglobin 9.7 (*)    HCT 32.3 (*)    MCV 73.9 (*)    MCH 22.2 (*)    RDW 25.1 (*)    Neutro Abs 10.1 (*)    All other components within normal limits  URINALYSIS, ROUTINE W REFLEX MICROSCOPIC - Abnormal; Notable for the following components:   APPearance CLOUDY (*)    Hgb urine dipstick MODERATE (*)    Protein, ur 30 (*)    Leukocytes,Ua TRACE (*)    Bacteria, UA MANY (*)    All other components within normal limits  CK  COMPREHENSIVE METABOLIC PANEL WITH GFR  MAGNESIUM   CBG MONITORING, ED  CBG MONITORING, ED  TROPONIN I (HIGH SENSITIVITY)    EKG: None  Radiology: No results found.   Procedures   Medications Ordered in the ED - No data to display                                  Medical Decision Making Amount and/or Complexity of Data Reviewed Labs: ordered. Radiology: ordered.  Risk Prescription drug  management. Decision regarding hospitalization.   This patient presents to the ED for concern of hypoglycemia, abdominal pain, flank pain, this involves an extensive number of treatment options, and is a complaint that carries  with it a high risk of complications and morbidity.  The differential diagnosis includes acute appendicitis, cholecystitis, bowel torsion, diverticulitis, testicular torsion, by nephritis, kidney stone, pancreatitis, mesenteric ischemia diabetes   Co morbidities that complicate the patient evaluation  Diabetes   Additional history obtained:  Additional history obtained from family External records from outside source obtained and reviewed including medical records   Lab Tests:  I Ordered, and personally interpreted labs.  The pertinent results include: Leukocytosis, anemia at baseline, elevated creatinine, normal electrolytes, normal liver function, negative troponin, negative CK, urinalysis with many bacteria, moderate hemoglobin and trace leukocytes   Imaging Studies ordered:  I ordered imaging studies including CT scan of head, CT renal study I independently visualized and interpreted imaging which showed no acute intracranial hemorrhage, right perinephric stranding I agree with the radiologist interpretation   Cardiac Monitoring: / EKG:  The patient was maintained on a cardiac monitor.  I personally viewed and interpreted the cardiac monitored which showed an underlying rhythm of: Normal sinus rhythm, no ST/T wave changes, no ischemic changes, no STEMI   Consultations Obtained:  I requested consultation with the hospitalist,  and discussed lab and imaging findings as well as pertinent plan - they recommend: Admission   Problem List / ED Course / Critical interventions / Medication management  Patient does remain stable at this time.  He did have repeat drop in his blood sugar and D50 was ordered as well as maintenance fluids with dextrose .  Did  discuss with patient we will plan for admission to the hospitalist service given his ongoing hyperglycemia issues as well as his apparent pyelonephritis.  Patient does have associated acute kidney injury as well.  No other acute surgical process was noted on CT scan of the abdomen and pelvis.  Head CT was unremarkable.  Electrolytes were within normal limits and he had no changes in liver function.  Urine culture has been ordered.  Have discussed patient case with Dr. Segars with the hospital service who has excepted for admission. I ordered medication including Rocephin , LR, D50 for hypoglycemia, acute kidney injury, pyelonephritis Reevaluation of the patient after these medicines showed that the patient improved I have reviewed the patients home medicines and have made adjustments as needed   Social Determinants of Health:  None   Test / Admission - Considered:  Admission     Final diagnoses:  None    ED Discharge Orders     None          Daralene Lonni JONETTA DEVONNA 01/21/24 2142    Charlyn Sora, MD 01/22/24 [phone]

## 2024-01-21 NOTE — ED Notes (Signed)
 Pt states that he thinks he had a kidney stone last week and has been feeling bad since. Reports that he did not seek medical attention for this but states that he has had them in the past and it felt the same.

## 2024-01-22 ENCOUNTER — Encounter (HOSPITAL_COMMUNITY): Payer: Self-pay | Admitting: Internal Medicine

## 2024-01-22 DIAGNOSIS — D500 Iron deficiency anemia secondary to blood loss (chronic): Secondary | ICD-10-CM | POA: Diagnosis not present

## 2024-01-22 DIAGNOSIS — D509 Iron deficiency anemia, unspecified: Secondary | ICD-10-CM | POA: Diagnosis present

## 2024-01-22 DIAGNOSIS — E611 Iron deficiency: Secondary | ICD-10-CM | POA: Diagnosis present

## 2024-01-22 DIAGNOSIS — E1169 Type 2 diabetes mellitus with other specified complication: Secondary | ICD-10-CM | POA: Diagnosis not present

## 2024-01-22 DIAGNOSIS — N10 Acute tubulo-interstitial nephritis: Secondary | ICD-10-CM | POA: Diagnosis not present

## 2024-01-22 DIAGNOSIS — K922 Gastrointestinal hemorrhage, unspecified: Secondary | ICD-10-CM | POA: Diagnosis not present

## 2024-01-22 DIAGNOSIS — E785 Hyperlipidemia, unspecified: Secondary | ICD-10-CM | POA: Diagnosis not present

## 2024-01-22 DIAGNOSIS — E162 Hypoglycemia, unspecified: Secondary | ICD-10-CM | POA: Diagnosis not present

## 2024-01-22 DIAGNOSIS — N179 Acute kidney failure, unspecified: Secondary | ICD-10-CM | POA: Diagnosis not present

## 2024-01-22 LAB — CBC
HCT: 28.1 % — ABNORMAL LOW (ref 39.0–52.0)
Hemoglobin: 8.5 g/dL — ABNORMAL LOW (ref 13.0–17.0)
MCH: 22.1 pg — ABNORMAL LOW (ref 26.0–34.0)
MCHC: 30.2 g/dL (ref 30.0–36.0)
MCV: 73.2 fL — ABNORMAL LOW (ref 80.0–100.0)
Platelets: 209 K/uL (ref 150–400)
RBC: 3.84 MIL/uL — ABNORMAL LOW (ref 4.22–5.81)
RDW: 24.6 % — ABNORMAL HIGH (ref 11.5–15.5)
WBC: 7.1 K/uL (ref 4.0–10.5)
nRBC: 0 % (ref 0.0–0.2)

## 2024-01-22 LAB — GLUCOSE, CAPILLARY
Glucose-Capillary: 131 mg/dL — ABNORMAL HIGH (ref 70–99)
Glucose-Capillary: 134 mg/dL — ABNORMAL HIGH (ref 70–99)
Glucose-Capillary: 142 mg/dL — ABNORMAL HIGH (ref 70–99)
Glucose-Capillary: 168 mg/dL — ABNORMAL HIGH (ref 70–99)
Glucose-Capillary: 187 mg/dL — ABNORMAL HIGH (ref 70–99)

## 2024-01-22 LAB — BASIC METABOLIC PANEL WITH GFR
Anion gap: 11 (ref 5–15)
BUN: 36 mg/dL — ABNORMAL HIGH (ref 8–23)
CO2: 24 mmol/L (ref 22–32)
Calcium: 8.2 mg/dL — ABNORMAL LOW (ref 8.9–10.3)
Chloride: 104 mmol/L (ref 98–111)
Creatinine, Ser: 1.41 mg/dL — ABNORMAL HIGH (ref 0.61–1.24)
GFR, Estimated: 54 mL/min — ABNORMAL LOW (ref >60)
Glucose, Bld: 177 mg/dL — ABNORMAL HIGH (ref 70–99)
Potassium: 3.4 mmol/L — ABNORMAL LOW (ref 3.5–5.1)
Sodium: 139 mmol/L (ref 135–145)

## 2024-01-22 LAB — IRON AND TIBC
Iron: <10 ug/dL — ABNORMAL LOW (ref 45–182)
TIBC: 291 ug/dL (ref 250–450)

## 2024-01-22 LAB — PHOSPHORUS: Phosphorus: <1 mg/dL — CL (ref 2.5–4.6)

## 2024-01-22 LAB — FERRITIN: Ferritin: 109 ng/mL (ref 24–336)

## 2024-01-22 LAB — MAGNESIUM: Magnesium: 1.9 mg/dL (ref 1.7–2.4)

## 2024-01-22 LAB — HIV ANTIBODY (ROUTINE TESTING W REFLEX): HIV Screen 4th Generation wRfx: NONREACTIVE

## 2024-01-22 LAB — MRSA NEXT GEN BY PCR, NASAL: MRSA by PCR Next Gen: NOT DETECTED

## 2024-01-22 LAB — TSH: TSH: 1.518 u[IU]/mL (ref 0.350–4.500)

## 2024-01-22 MED ORDER — DEXTROSE IN LACTATED RINGERS 5 % IV SOLN: INTRAVENOUS | Status: DC

## 2024-01-22 MED ORDER — FERROUS SULFATE 325 (65 FE) MG PO TABS
325.0000 mg | ORAL_TABLET | Freq: Every day | ORAL | Status: DC
  Administered 2024-01-22: 325 mg via ORAL
  Filled 2024-01-22: qty 1

## 2024-01-22 MED ORDER — ACETAMINOPHEN 500 MG PO TABS: 1000.0000 mg | ORAL_TABLET | Freq: Four times a day (QID) | ORAL | Status: AC | PRN

## 2024-01-22 MED ORDER — PANTOPRAZOLE SODIUM 40 MG PO TBEC: 40.0000 mg | DELAYED_RELEASE_TABLET | Freq: Every day | ORAL | 1 refills | Status: DC

## 2024-01-22 MED ORDER — POTASSIUM PHOSPHATES 15 MMOLE/5ML IV SOLN
30.0000 mmol | Freq: Once | INTRAVENOUS | Status: AC
  Administered 2024-01-22: 30 mmol via INTRAVENOUS
  Filled 2024-01-22: qty 10

## 2024-01-22 MED ORDER — POLYETHYLENE GLYCOL 3350 17 G PO PACK: 17.0000 g | PACK | Freq: Every day | ORAL | 1 refills | Status: DC | PRN

## 2024-01-22 MED ORDER — ASCORBIC ACID 500 MG PO TABS
500.0000 mg | ORAL_TABLET | Freq: Every day | ORAL | 2 refills | Status: DC
Start: 2024-01-22 — End: 2024-03-02

## 2024-01-22 MED ORDER — AMLODIPINE BESYLATE 5 MG PO TABS
5.0000 mg | ORAL_TABLET | Freq: Every day | ORAL | Status: DC
  Administered 2024-01-22: 5 mg via ORAL
  Filled 2024-01-22: qty 1

## 2024-01-22 MED ORDER — LANTUS SOLOSTAR 100 UNIT/ML ~~LOC~~ SOPN: 20.0000 [IU] | PEN_INJECTOR | Freq: Every evening | SUBCUTANEOUS | Status: DC | PRN

## 2024-01-22 MED ORDER — K PHOS MONO-SOD PHOS DI & MONO 155-852-130 MG PO TABS
500.0000 mg | ORAL_TABLET | ORAL | Status: AC
  Administered 2024-01-22 (×2): 500 mg via ORAL
  Filled 2024-01-22 (×2): qty 2

## 2024-01-22 MED ORDER — LEVOFLOXACIN 750 MG PO TABS: 750.0000 mg | ORAL_TABLET | Freq: Every day | ORAL | 0 refills | Status: AC

## 2024-01-22 MED ORDER — SODIUM CHLORIDE 0.9 % IV SOLN
2.0000 g | INTRAVENOUS | Status: DC
  Administered 2024-01-22: 2 g via INTRAVENOUS
  Filled 2024-01-22: qty 20

## 2024-01-22 MED ORDER — POTASSIUM CHLORIDE CRYS ER 20 MEQ PO TBCR
40.0000 meq | EXTENDED_RELEASE_TABLET | Freq: Once | ORAL | Status: AC
  Administered 2024-01-22: 40 meq via ORAL
  Filled 2024-01-22: qty 2

## 2024-01-22 MED ORDER — VITAMIN C 500 MG PO TABS: 500.0000 mg | ORAL_TABLET | Freq: Every day | ORAL | Status: DC

## 2024-01-22 NOTE — Progress Notes (Signed)
 Being discharged to home. Removed IV's and monitoring equipment. Patient dressed himself and asked for brief for transport home. We do not normally have briefs and I could not offer one. I did offer pad for pants. Rolled to car by wheel chair and transported by son home.

## 2024-01-22 NOTE — Plan of Care (Signed)

## 2024-01-22 NOTE — Evaluation (Signed)
 Physical Therapy Evaluation Patient Details Name: Tyler Garner MRN: [id number] DOB: 1955/08/31 Today's Date: 01/22/2024  History of Present Illness  Tyler Garner is a 68 y.o. male with hx of CAD with hx PCI, HTN, HLD, DM type 2, BPH hx TURP, obesity, hx lumbar laminectomy, who was brought in by EMS after found down at home., noted to have hypoglycemia at 57 treated with D10 by EMS. Patient reports in normal state of health till Sunday 8/23 and developed R flank sharp pain, and since then has been sore in the R flank. Thought it may have been a kidney stone although did not see one pass. Then over the past 3-4 days has noted generalized weakness, decreased appetite and PO intake, has not taken any medications (including insulin  and sulfonylurea over this time frame). Today tried to get up but unable and ended up falling onto the ground. No injuries noted. He reports being confused earlier but this has resolved. Otherwise notes that he has chronic dark stools x 6 months and was taking regular goody powders which he stopped because he was having epigastric pain associated. Today noted scant amount of bright red blood in his stool x 1 episode.    Clinical Impression  On therapist arrival; patient lying in bed; his son is at bedside. Patient is agreeable to therapist assessment.  Patient performs supine to sit taking extra time with SBA and demonstrates good sitting balance on the edge of the bed with feet on the floor.  Patient needs CGA for sit to stand using hands to push up to the RW.  He stands leaning heavily on the RW.  Patient ambulates x 60 ft with RW and CGA and needs max cues to stay in the walker frame; he tends to forward flex at the trunk.  Son states this is his normal posturing.  Patient returns to the room and sits in the chair with cues to back up fully to the chair.  patient left in chair with his son present.  Patient will benefit from continued skilled therapy services during the  remainder of his hospital stay and at the next recommended venue of care to address deficits and promote return to optimal function.             If plan is discharge home, recommend the following: A little help with walking and/or transfers;A little help with bathing/dressing/bathroom   Can travel by private vehicle        Equipment Recommendations None recommended by PT  Recommendations for Other Services       Functional Status Assessment Patient has had a recent decline in their functional status and demonstrates the ability to make significant improvements in function in a reasonable and predictable amount of time.     Precautions / Restrictions Precautions Precautions: Fall Recall of Precautions/Restrictions: Intact Restrictions Weight Bearing Restrictions Per Provider Order: No      Mobility  Bed Mobility Overal bed mobility: Modified Independent             General bed mobility comments: takes extra time Patient Response: Cooperative  Transfers Overall transfer level: Needs assistance Equipment used: Rolling walker (2 wheels) Transfers: Sit to/from Stand Sit to Stand: Contact guard assist           General transfer comment: pushes up with hands for sit to stand    Ambulation/Gait Ambulation/Gait assistance: Contact guard assist Gait Distance (Feet): 60 Feet Assistive device: Rolling walker (2 wheels)  General Gait Details: significantly forward flexed trunk; tends to walk behind the walker frame  Stairs            Wheelchair Mobility     Tilt Bed Tilt Bed Patient Response: Cooperative  Modified Rankin (Stroke Patients Only)       Balance Overall balance assessment: Needs assistance Sitting-balance support: Feet supported Sitting balance-Leahy Scale: Good Sitting balance - Comments: good sitting balance on the edge of the bed   Standing balance support: Bilateral upper extremity supported, Reliant on assistive device  for balance, During functional activity Standing balance-Leahy Scale: Fair Standing balance comment: fair standing balance with RW and CGA for safety                             Pertinent Vitals/Pain Pain Assessment Pain Assessment: 0-10 Pain Score: 8  Pain Location: chronic back and hip pain Pain Intervention(s): Monitored during session    Home Living Family/patient expects to be discharged to:: Private residence Living Arrangements: Alone Available Help at Discharge: Family;Available PRN/intermittently Type of Home: House Home Access: Level entry       Home Layout: Laundry or work area in basement;One level Home Equipment: Agricultural consultant (2 wheels);Cane - single point;Shower seat - built in      Prior Function Prior Level of Function : Independent/Modified Independent             Mobility Comments: uses cane and RW for mobility       Extremity/Trunk Assessment   Upper Extremity Assessment Upper Extremity Assessment: Generalized weakness    Lower Extremity Assessment Lower Extremity Assessment: Generalized weakness    Cervical / Trunk Assessment Cervical / Trunk Assessment: Kyphotic  Communication   Communication Communication: No apparent difficulties    Cognition Arousal: Alert Behavior During Therapy: WFL for tasks assessed/performed   PT - Cognitive impairments: No apparent impairments                         Following commands: Intact       Cueing       General Comments      Exercises     Assessment/Plan    PT Assessment Patient needs continued PT services  PT Problem List Decreased strength;Decreased activity tolerance;Decreased balance;Decreased mobility;Pain       PT Treatment Interventions DME instruction;Gait training;Functional mobility training;Therapeutic activities;Therapeutic exercise;Balance training;Patient/family education    PT Goals (Current goals can be found in the Care Plan section)  Acute  Rehab PT Goals Patient Stated Goal: return home PT Goal Formulation: With patient/family Time For Goal Achievement: 02/05/24 Potential to Achieve Goals: Good    Frequency Min 2X/week     Co-evaluation               AM-PAC PT 6 Clicks Mobility  Outcome Measure Help needed turning from your back to your side while in a flat bed without using bedrails?: None Help needed moving from lying on your back to sitting on the side of a flat bed without using bedrails?: None Help needed moving to and from a bed to a chair (including a wheelchair)?: A Little Help needed standing up from a chair using your arms (e.g., wheelchair or bedside chair)?: A Little Help needed to walk in hospital room?: A Little Help needed climbing 3-5 steps with a railing? : A Lot 6 Click Score: 19    End of Session Equipment Utilized During Treatment:  Gait belt Activity Tolerance: Patient tolerated treatment well Patient left: in chair;with family/visitor present;with call bell/phone within reach Nurse Communication: Mobility status PT Visit Diagnosis: Other abnormalities of gait and mobility (R26.89);Repeated falls (R29.6);Muscle weakness (generalized) (M62.81);History of falling (Z91.81);Pain Pain - part of body:  (back and hips)    Time: 8954-8884 PT Time Calculation (min) (ACUTE ONLY): 30 min   Charges:   PT Evaluation $PT Eval Moderate Complexity: 1 Mod   PT General Charges $$ ACUTE PT VISIT: 1 Visit         11:28 AM, 01/22/24 Makennah Omura Small Rashon Westrup MPT [facility] [place] [phone]

## 2024-01-22 NOTE — Discharge Summary (Addendum)
 Physician Discharge Summary  Tyler Garner FMW:[id number] DOB: [birth year] DOA: 01/21/2024  PCP: Gladis Mustard, [name]  Admit date: 01/21/2024 Discharge date: 01/22/2024  Admitted From:  Home  Disposition: Home   Patient insisted on discharge home today  Recommendations for Outpatient Follow-up:  Follow up with PCP in 1 weeks Please obtain BMP/CBC/Phos/Mg  in 1 week Pt urgently needs EGD and colonoscopy, he agreed to referral to [facility] which order has been placed AVOID NSAID MEDICATIONS like celebrex , aleve, ibuprofen, Goody's Powder  Home Health: PT   Discharge Condition: STABLE, but Guarded, needs close outpatient follow up   CODE STATUS: FULL DIET: iron rich foods recommended   Brief Hospitalization Summary: Please see all hospital notes, images, labs for full details of the hospitalization. Admission provider HPI:  68 y.o. male with hx of CAD with hx PCI, HTN, HLD, DM type 2, BPH hx TURP, obesity, hx lumbar laminectomy, who was brought in by EMS after found down at home., noted to have hypoglycemia at 57 treated with D10 by EMS. Patient reports in normal state of health till Sunday 8/23 and developed R flank sharp pain, and since then has been sore in the R flank. Thought it may have been a kidney stone although did not see one pass. Then over the past 3-4 days has noted generalized weakness, decreased appetite and PO intake, has not taken any medications (including insulin  and sulfonylurea over this time frame). Today tried to get up but unable and ended up falling onto the ground. No injuries noted. He reports being confused earlier but this has resolved. Otherwise notes that he has chronic dark stools x 6 months and was taking regular goody powders which he stopped because he was having epigastric pain associated. Today noted scant amount of bright red blood in his stool x 1 episode.   Hospital Course Pt was admitted with findings of AKI, dehydration and poor oral intake.   He had been taking NSAIDs for arthritis symptoms.  He was also noted to be anemic and iron deficient and had been started on iron supplementation approximately 1 month ago by his PCP.  His serum iron was less than 10 which is severely low.  He was also noted to have severe hypophosphatemia with a phosphorus of less than 1.0.  This was repleted with IV potassium phosphorus.  He was given additional potassium as well.  He was hypoglycemic on arrival.  He had been prescribed insulin  and oral sulfonylureas twice daily.  This has been adjusted.  He has been taken off sulfonylureas and his basal insulin  dose has been reduced by 50% to avoid recurrent hypoglycemia.  I had a long discussion with the patient regarding my concern regarding his severe iron deficiency.  Patient says he has declined colonoscopy screening many times since the age of 65.  After I counseled with him he is now agreeable to be referred to [facility] for consultation as he likely will need EGD and colonoscopy as I worry that he may have NSAID induced ulcers and could also have a GI malignancy.  Patient agreed to GI referral and I have placed an ambulatory referral to [facility].  In addition I encouraged him to continue taking daily oral iron supplementation and added a daily vitamin C  tablet to help increase iron absorption.  Patient was strongly advised to stop NSAIDs like Celebrex , ibuprofen, Aleve, Goody powder.  In addition, we have started him on GI protection with pantoprazole  40 mg daily.  Further recommendations  after he follows up with [facility].  I also spoke with his son at the bedside and requested that he make sure that his father follows up with [facility] and expressed to him the seriousness of the situation and need for follow-up.  He verbalized understanding and agreed to make sure his father followed up.  Patient insists on discharging home today.  He is eating and drinking well.  His electrolytes have improved and his  phosphorus has been repleted.  His pyelonephritis was treated with IV high-dose ceftriaxone  and he will be discharged home on oral levofloxacin  750 mg daily to complete course.  I expressed to him that he needed close outpatient follow-up with his PCP and GI and he verbalized understanding and agreed to do so.  I came back to see him twice to be sure he felt well to go home and he insists on going home today.  He is discharging home today in stable condition.   Discharge Diagnoses:  Principal Problem:   Hypoglycemia Active Problems:   Hypophosphatemia   Severe Iron deficiency   IDA (iron deficiency anemia)   Hyperlipidemia associated with type 2 diabetes mellitus (HCC)   Hx of myocardial infarction   BPH (benign prostatic hyperplasia)   Diabetes mellitus treated with insulin  and oral medication (HCC)   Ischemic heart disease due to coronary artery obstruction (HCC)   Acute pyelonephritis   AKI (acute kidney injury) (HCC)   Chronic GI bleeding   Discharge Instructions: Discharge Instructions     Ambulatory referral to Gastroenterology   Complete by: As directed    Severe IDA on NSAIDS, needs EGD/colon, never had a screening colon at 68 y/o   What is the reason for referral?: Colonoscopy      Allergies as of 01/22/2024       Reactions   Percocet [oxycodone -acetaminophen ] Nausea And Vomiting        Medication List     STOP taking these medications    celecoxib  200 MG capsule Commonly known as: CELEBREX    glimepiride  4 MG tablet Commonly known as: AMARYL    lisinopril  20 MG tablet Commonly known as: ZESTRIL        TAKE these medications    acetaminophen  500 MG tablet Commonly known as: TYLENOL  Take 2 tablets (1,000 mg total) by mouth every 6 (six) hours as needed for mild pain (pain score 1-3).   amLODipine  5 MG tablet Commonly known as: NORVASC  Take 1 tablet (5 mg total) by mouth daily.   ascorbic acid  500 MG tablet Commonly known as: VITAMIN C  Take 1  tablet (500 mg total) by mouth daily with breakfast.   aspirin  EC 81 MG tablet Take 81 mg by mouth daily. Swallow whole.   ferrous sulfate  325 (65 FE) MG tablet Take 325 mg by mouth daily with breakfast.   Lantus  SoloStar 100 UNIT/ML Solostar Pen Generic drug: insulin  glargine Inject 20 Units into the skin at bedtime as needed (Only takes for levels over 100). What changed:  how much to take when to take this reasons to take this   levofloxacin  750 MG tablet Commonly known as: Levaquin  Take 1 tablet (750 mg total) by mouth daily for 4 days. Start taking on: January 23, 2024   metoprolol  tartrate 25 MG tablet Commonly known as: LOPRESSOR  Take 1 tablet (25 mg total) by mouth 2 (two) times daily.   multivitamin with minerals Tabs tablet Take 1 tablet by mouth daily.   pantoprazole  40 MG tablet Commonly known as: PROTONIX   Take 1 tablet (40 mg total) by mouth daily.   polyethylene glycol 17 g packet Commonly known as: MIRALAX  / GLYCOLAX  Take 17 g by mouth daily as needed for mild constipation.   rosuvastatin  40 MG tablet Commonly known as: Crestor  Take 0.5 tablets (20 mg total) by mouth daily.   tamsulosin  0.4 MG Caps capsule Commonly known as: FLOMAX  Take 0.4 mg by mouth at bedtime.        Follow-up Information     Gladis Mustard, [name]. Schedule an appointment as soon as possible for a visit in 1 week(s).   Specialty: Family Medicine Why: Hospital Follow Up Contact information: [address] [place] [address] [phone]         [facility] Zelda Salmon O/P. Schedule an appointment as soon as possible for a visit in 2 week(s).   Why: Schedule EGD and colonoscopy as work up for iron deficiency Contact information: [address] [place]  [phone]               Allergies  Allergen Reactions   Percocet [Oxycodone -Acetaminophen ] Nausea And Vomiting   Allergies as of 01/22/2024       Reactions   Percocet  [oxycodone -acetaminophen ] Nausea And Vomiting        Medication List     STOP taking these medications    celecoxib  200 MG capsule Commonly known as: CELEBREX    glimepiride  4 MG tablet Commonly known as: AMARYL    lisinopril  20 MG tablet Commonly known as: ZESTRIL        TAKE these medications    acetaminophen  500 MG tablet Commonly known as: TYLENOL  Take 2 tablets (1,000 mg total) by mouth every 6 (six) hours as needed for mild pain (pain score 1-3).   amLODipine  5 MG tablet Commonly known as: NORVASC  Take 1 tablet (5 mg total) by mouth daily.   ascorbic acid  500 MG tablet Commonly known as: VITAMIN C  Take 1 tablet (500 mg total) by mouth daily with breakfast.   aspirin  EC 81 MG tablet Take 81 mg by mouth daily. Swallow whole.   ferrous sulfate  325 (65 FE) MG tablet Take 325 mg by mouth daily with breakfast.   Lantus  SoloStar 100 UNIT/ML Solostar Pen Generic drug: insulin  glargine Inject 20 Units into the skin at bedtime as needed (Only takes for levels over 100). What changed:  how much to take when to take this reasons to take this   levofloxacin  750 MG tablet Commonly known as: Levaquin  Take 1 tablet (750 mg total) by mouth daily for 4 days. Start taking on: January 23, 2024   metoprolol  tartrate 25 MG tablet Commonly known as: LOPRESSOR  Take 1 tablet (25 mg total) by mouth 2 (two) times daily.   multivitamin with minerals Tabs tablet Take 1 tablet by mouth daily.   pantoprazole  40 MG tablet Commonly known as: PROTONIX  Take 1 tablet (40 mg total) by mouth daily.   polyethylene glycol 17 g packet Commonly known as: MIRALAX  / GLYCOLAX  Take 17 g by mouth daily as needed for mild constipation.   rosuvastatin  40 MG tablet Commonly known as: Crestor  Take 0.5 tablets (20 mg total) by mouth daily.   tamsulosin  0.4 MG Caps capsule Commonly known as: FLOMAX  Take 0.4 mg by mouth at bedtime.        Procedures/Studies: CT Head Wo Contrast Result  Date: 01/21/2024 CLINICAL DATA:  Syncope/presyncope, cerebrovascular cause suspected EXAM: CT HEAD WITHOUT CONTRAST TECHNIQUE: Contiguous axial images were obtained from the base of the  skull through the vertex without intravenous contrast. RADIATION DOSE REDUCTION: This exam was performed according to the departmental dose-optimization program which includes automated exposure control, adjustment of the mA and/or kV according to patient size and/or use of iterative reconstruction technique. COMPARISON:  None Available. FINDINGS: Brain: No evidence of acute infarction, hemorrhage, hydrocephalus, extra-axial collection or mass lesion/mass effect. Vascular: No hyperdense vessel or unexpected calcification. Skull: Normal. Negative for fracture or focal lesion. Sinuses/Orbits: No acute finding. Other: Mastoid air cells and middle ear cavities are clear. IMPRESSION: 1. No acute intracranial abnormality. Electronically Signed   By: Dorethia Molt M.D.   On: 01/21/2024 20:20   CT Renal Stone Study Result Date: 01/21/2024 CLINICAL DATA:  Found down, anorexia, incontinence EXAM: CT ABDOMEN AND PELVIS WITHOUT CONTRAST TECHNIQUE: Multidetector CT imaging of the abdomen and pelvis was performed following the standard protocol without IV contrast. RADIATION DOSE REDUCTION: This exam was performed according to the departmental dose-optimization program which includes automated exposure control, adjustment of the mA and/or kV according to patient size and/or use of iterative reconstruction technique. COMPARISON:  None Available. FINDINGS: Lower chest: Visualized lung bases are clear. Extensive coronary artery calcification. Calcification of the aortic valve leaflets. Hypoattenuation of the cardiac blood pool in keeping with at least mild anemia. Cardiac size within normal limits Hepatobiliary: Cholelithiasis without superimposed pericholecystic inflammatory change. Liver unremarkable on this noncontrast examination. No intra or  extrahepatic biliary ductal dilation. Pancreas: Unremarkable Spleen: Unremarkable Adrenals/Urinary Tract: Adrenal glands are unremarkable. Kidneys are normal in size and position. Progressive right perinephric stranding suggesting unilateral acute on chronic inflammatory process such as pyelonephritis. No hydronephrosis. Stable punctate 1-2 mm nonobstructing calculus within the upper pole the right kidney. No ureteral calculi. Bladder unremarkable. Stomach/Bowel: Stomach is within normal limits. Appendix appears normal. No evidence of bowel wall thickening, distention, or inflammatory changes. Vascular/Lymphatic: Aortic atherosclerosis. No enlarged abdominal or pelvic lymph nodes. Reproductive: TURP defect within the central prostate gland. The prostate gland seminal vesicles are otherwise unremarkable. Other: Tiny fat containing umbilical hernia. No abdominopelvic ascites. Musculoskeletal: No acute bone abnormality. No lytic or blastic bone lesion. Bilateral laminectomy and posterior decompression L4. Multifactorial severe central canal stenosis at L3 (53/2) not well characterized on this examination. Advanced degenerative changes noted lumbar spine at L3-4. IMPRESSION: 1. Progressive right perinephric stranding suggesting unilateral acute on chronic inflammatory process such as pyelonephritis. Correlation with urinalysis and urine culture may be helpful. No hydronephrosis. 2. Stable punctate 1-2 mm nonobstructing calculus within the upper pole the right kidney. 3. Cholelithiasis. 4. Extensive coronary artery calcification.  Valvular calcification 5. Multifactorial severe central canal stenosis at L3 not well characterized on this examination. This could be better assessed with MRI examination if indicated. 6. Aortic atherosclerosis. Aortic Atherosclerosis (ICD10-I70.0). Electronically Signed   By: Dorethia Molt M.D.   On: 01/21/2024 20:18     Subjective: Pt insisting on going home today, says he feels much  better today after IV fluid and he has been eating and drinking well.  After discussion, he is agreeable to follow up with GI outpatient as he has never had a colon cancer screening done or EGD.  He says he has had black stools even prior to starting iron last month.   Discharge Exam: Vitals:   01/22/24 0900 01/22/24 1126  BP: (!) 142/39   Pulse: 88   Resp: (!) 26   Temp:  97.7 F (36.5 C)  SpO2: 96%    Vitals:   01/22/24 0730 01/22/24 0800 01/22/24 0900 01/22/24  1126  BP:  (!) 132/40 (!) 142/39   Pulse:  90 88   Resp:  (!) 25 (!) 26   Temp: 97.6 F (36.4 C)   97.7 F (36.5 C)  TempSrc: Oral   Oral  SpO2:  99% 96%   Weight:      Height:       General: Pt is alert, awake, not in acute distress Cardiovascular: normal S1/S2 +, no rubs, no gallops Respiratory: CTA bilaterally, no wheezing, no rhonchi Abdominal: Soft, NT, ND, bowel sounds + Extremities: no edema, no cyanosis   The results of significant diagnostics from this hospitalization (including imaging, microbiology, ancillary and laboratory) are listed below for reference.     Microbiology: Recent Results (from the past 240 hours)  Culture, blood (Routine X 2) w Reflex to ID Panel     Status: None (Preliminary result)   Collection Time: 01/21/24 10:00 PM   Specimen: BLOOD RIGHT HAND  Result Value Ref Range Status   Specimen Description BLOOD RIGHT HAND  Final   Special Requests BOTTLES DRAWN AEROBIC AND ANAEROBIC  Final   Culture   Final    NO GROWTH < 12 HOURS Performed at [facility], [address] [place], [place] [address]    Report Status PENDING  Incomplete  Culture, blood (Routine X 2) w Reflex to ID Panel     Status: None (Preliminary result)   Collection Time: 01/21/24 10:07 PM   Specimen: Left Antecubital; Blood  Result Value Ref Range Status   Specimen Description LEFT ANTECUBITAL  Final   Special Requests BOTTLES DRAWN AEROBIC AND ANAEROBIC  Final   Culture   Final    NO GROWTH < 12  HOURS Performed at [facility], [address], [place], [place] [address]    Report Status PENDING  Incomplete     Labs: BNP (last 3 results) No results for input(s): BNP in the last 8760 hours. Basic Metabolic Panel: Recent Labs  Lab 01/21/24 1826 01/22/24 0510  NA 136 139  K 3.8 3.4*  CL 98 104  CO2 21* 24  GLUCOSE 85 177*  BUN 46* 36*  CREATININE 1.73* 1.41*  CALCIUM  8.5* 8.2*  MG 1.9 1.9  PHOS  --  <1.0*   Liver Function Tests: Recent Labs  Lab 01/21/24 1826  AST 30  ALT 16  ALKPHOS 75  BILITOT 0.6  PROT 6.9  ALBUMIN  3.1*   No results for input(s): LIPASE, AMYLASE in the last 168 hours. No results for input(s): AMMONIA in the last 168 hours. CBC: Recent Labs  Lab 01/21/24 1826 01/22/24 0510  WBC 11.7* 7.1  NEUTROABS 10.1*  --   HGB 9.7* 8.5*  HCT 32.3* 28.1*  MCV 73.9* 73.2*  PLT 225 209   Cardiac Enzymes: Recent Labs  Lab 01/21/24 1826  CKTOTAL 373   BNP: Invalid input(s): POCBNP CBG: Recent Labs  Lab 01/22/24 0004 01/22/24 0105 01/22/24 0206 01/22/24 0404 01/22/24 0715  GLUCAP 142* 134* 131* 187* 168*   D-Dimer No results for input(s): DDIMER in the last 72 hours. Hgb A1c No results for input(s): HGBA1C in the last 72 hours. Lipid Profile No results for input(s): CHOL, HDL, LDLCALC, TRIG, CHOLHDL, LDLDIRECT in the last 72 hours. Thyroid  function studies Recent Labs    01/22/24 0510  TSH 1.518   Anemia work up Recent Labs    01/22/24 0510  FERRITIN 109  TIBC 291  IRON <10*   Urinalysis    Component Value Date/Time   COLORURINE  YELLOW 01/21/2024 1754   APPEARANCEUR CLOUDY (A) 01/21/2024 1754   APPEARANCEUR Clear 01/31/2019 0819   LABSPEC 1.006 01/21/2024 1754   PHURINE 5.0 01/21/2024 1754   GLUCOSEU NEGATIVE 01/21/2024 1754   HGBUR MODERATE (A) 01/21/2024 1754   BILIRUBINUR NEGATIVE 01/21/2024 1754   BILIRUBINUR Negative 01/31/2019 0819   KETONESUR NEGATIVE 01/21/2024 1754   PROTEINUR  30 (A) 01/21/2024 1754   NITRITE NEGATIVE 01/21/2024 1754   LEUKOCYTESUR TRACE (A) 01/21/2024 1754   Sepsis Labs Recent Labs  Lab 01/21/24 1826 01/22/24 0510  WBC 11.7* 7.1   Microbiology Recent Results (from the past 240 hours)  Culture, blood (Routine X 2) w Reflex to ID Panel     Status: None (Preliminary result)   Collection Time: 01/21/24 10:00 PM   Specimen: BLOOD RIGHT HAND  Result Value Ref Range Status   Specimen Description BLOOD RIGHT HAND  Final   Special Requests BOTTLES DRAWN AEROBIC AND ANAEROBIC  Final   Culture   Final    NO GROWTH < 12 HOURS Performed at [facility], [address] [place], [place] [address]    Report Status PENDING  Incomplete  Culture, blood (Routine X 2) w Reflex to ID Panel     Status: None (Preliminary result)   Collection Time: 01/21/24 10:07 PM   Specimen: Left Antecubital; Blood  Result Value Ref Range Status   Specimen Description LEFT ANTECUBITAL  Final   Special Requests BOTTLES DRAWN AEROBIC AND ANAEROBIC  Final   Culture   Final    NO GROWTH < 12 HOURS Performed at [facility], [address] [place], [place] [address]    Report Status PENDING  Incomplete    Time coordinating discharge: 40 mins  SIGNED:  Afton Louder, MD  [facility] 01/22/2024, 12:24 PM How to contact the [facility] Attending or Consulting provider 7A - 7P or covering provider during after hours 7P -7A, for this patient?  Check the care team in [facility] and look for a) attending/consulting [facility] provider listed and b) the [facility] team listed Log into [email or web address] and use [place]'s universal password to access. If you do not have the password, please contact the hospital operator. Locate the [facility] provider you are looking for under [facility] and page to a number that you can be directly reached. If you still have difficulty reaching the provider, please page the [facility] (Director on Call) for the Hospitalists listed on amion for assistance.

## 2024-01-22 NOTE — Discharge Instructions (Signed)
 PLEASE MAKE SURE TO FOLLOW UP WITH [facility] TO ARRANGE FOR A COLONOSCOPY   IMPORTANT INFORMATION: PAY CLOSE ATTENTION   PHYSICIAN DISCHARGE INSTRUCTIONS  Follow with Primary care provider  Gladis [name], [name]  and other consultants as instructed by your Hospitalist Physician  SEEK MEDICAL CARE OR RETURN TO EMERGENCY ROOM IF SYMPTOMS COME BACK, WORSEN OR NEW PROBLEM DEVELOPS   Please note: You were cared for by a hospitalist during your hospital stay. Every effort will be made to forward records to your primary care provider.  You can request that your primary care provider send for your hospital records if they have not received them.  Once you are discharged, your primary care physician will handle any further medical issues. Please note that NO REFILLS for any discharge medications will be authorized once you are discharged, as it is imperative that you return to your primary care physician (or establish a relationship with a primary care physician if you do not have one) for your post hospital discharge needs so that they can reassess your need for medications and monitor your lab values.  Please get a complete blood count and chemistry panel checked by your Primary MD at your next visit, and again as instructed by your Primary MD.  Get Medicines reviewed and adjusted: Please take all your medications with you for your next visit with your Primary MD  Laboratory/radiological data: Please request your Primary MD to go over all hospital tests and procedure/radiological results at the follow up, please ask your primary care provider to get all Hospital records sent to his/her office.  In some cases, they will be blood work, cultures and biopsy results pending at the time of your discharge. Please request that your primary care provider follow up on these results.  If you are diabetic, please bring your blood sugar readings with you to your follow up appointment with primary care.     Please call and make your follow up appointments as soon as possible.    Also Note the following: If you experience worsening of your admission symptoms, develop shortness of breath, life threatening emergency, suicidal or homicidal thoughts you must seek medical attention immediately by calling 911 or calling your MD immediately  if symptoms less severe.  You must read complete instructions/literature along with all the possible adverse reactions/side effects for all the Medicines you take and that have been prescribed to you. Take any new Medicines after you have completely understood and accpet all the possible adverse reactions/side effects.   Do not drive when taking Pain medications or sleeping medications (Benzodiazepines)  Do not take more than prescribed Pain, Sleep and Anxiety Medications. It is not advisable to combine anxiety,sleep and pain medications without talking with your primary care practitioner  Special Instructions: If you have smoked or chewed Tobacco  in the last 2 yrs please stop smoking, stop any regular Alcohol  and or any Recreational drug use.  Wear Seat belts while driving.  Do not drive if taking any narcotic, mind altering or controlled substances or recreational drugs or alcohol.

## 2024-01-22 NOTE — Plan of Care (Signed)
  Problem: Acute Rehab PT Goals(only PT should resolve) Goal: Pt Will Go Supine/Side To Sit Outcome: Progressing Flowsheets (Taken 01/22/2024 1129) Pt will go Supine/Side to Sit: Independently Goal: Patient Will Transfer Sit To/From Stand Outcome: Progressing Flowsheets (Taken 01/22/2024 1129) Patient will transfer sit to/from stand: with supervision Goal: Pt Will Transfer Bed To Chair/Chair To Bed Outcome: Progressing Flowsheets (Taken 01/22/2024 1129) Pt will Transfer Bed to Chair/Chair to Bed: with supervision Goal: Pt Will Ambulate Outcome: Progressing Flowsheets (Taken 01/22/2024 1129) Pt will Ambulate:  100 feet  with supervision  with rolling walker

## 2024-01-23 NOTE — TOC Transition Note (Signed)
 Transition of Care [facility]) - Discharge Note   Patient Details  Name: Tyler Garner MRN: [id number] Date of Birth: 1955-12-20  Transition of Care [facility]) CM/SW Contact:  Nena LITTIE Coffee, RN Phone Number: 01/23/2024, 2:30 PM   Clinical Narrative:    Pt was eager to leave and discharged was pushed up yesterday and I was unable to set up [facility] services before he left.  [facility] accepted referral and will contact pt to schedule visits. Mr. Vallin was updated via phone.    Final next level of care: Home w Home Health Services     Patient Goals and CMS Choice            Discharge Placement                       Discharge Plan and Services Additional resources added to the After Visit Summary for                            [facility] Arranged: PT HH Agency: [facility] Date [facility] Agency Contacted: 01/22/24   Representative spoke with at [facility] Agency: Selinda  Social Drivers of Health (SDOH) Interventions SDOH Screenings   Food Insecurity: No Food Insecurity (01/21/2024)  Housing: Low Risk  (01/21/2024)  Transportation Needs: No Transportation Needs (01/21/2024)  Utilities: Not At Risk (01/21/2024)  Alcohol Screen: Low Risk  (12/28/2023)  Depression (PHQ2-9): Low Risk  (12/28/2023)  Financial Resource Strain: Low Risk  (12/28/2023)  Physical Activity: Insufficiently Active (12/28/2023)  Social Connections: Socially Isolated (01/21/2024)  Stress: No Stress Concern Present (12/28/2023)  Tobacco Use: Medium Risk (01/22/2024)  Health Literacy: Adequate Health Literacy (12/28/2023)     Readmission Risk Interventions     No data to display

## 2024-01-24 LAB — URINE CULTURE: Culture: >=100000 — AB

## 2024-01-25 ENCOUNTER — Telehealth: Payer: Self-pay | Admitting: Nurse Practitioner

## 2024-01-25 NOTE — Telephone Encounter (Signed)
 Copied from CRM [id number]. Topic: General - Call Back - No Documentation >> Jan 25, 2024  9:07 AM Avram MATSU wrote: Reason for CRM: patient would like a call back to discuss recent hospital visit and stated he does not want to come in.

## 2024-01-25 NOTE — Telephone Encounter (Signed)
 Called and spoke with patient and he advised on his hospital stay. He states that they gave him 4 pills and he is afraid to take them. After reviewing the chart it looks like they gave him 4 tabs on levofloxacin .  Advised patient to go ahead and take them and he stated that he would. Also states that he is going to see a GI in the next few weeks. Advised patient to keep us  posted. Patient verbalized understanding

## 2024-01-26 LAB — CULTURE, BLOOD (ROUTINE X 2)
Culture: NO GROWTH
Culture: NO GROWTH

## 2024-02-09 DIAGNOSIS — N35011 Post-traumatic bulbous urethral stricture: Secondary | ICD-10-CM | POA: Diagnosis not present

## 2024-02-09 DIAGNOSIS — R310 Gross hematuria: Secondary | ICD-10-CM | POA: Diagnosis not present

## 2024-02-09 DIAGNOSIS — R3912 Poor urinary stream: Secondary | ICD-10-CM | POA: Diagnosis not present

## 2024-02-14 ENCOUNTER — Telehealth (INDEPENDENT_AMBULATORY_CARE_PROVIDER_SITE_OTHER): Payer: Self-pay

## 2024-02-14 ENCOUNTER — Ambulatory Visit (INDEPENDENT_AMBULATORY_CARE_PROVIDER_SITE_OTHER): Admitting: Gastroenterology

## 2024-02-14 ENCOUNTER — Encounter (INDEPENDENT_AMBULATORY_CARE_PROVIDER_SITE_OTHER): Payer: Self-pay | Admitting: Gastroenterology

## 2024-02-14 VITALS — BP 130/70 | HR 77 | Temp 97.1°F | Ht 70.0 in | Wt 207.8 lb

## 2024-02-14 DIAGNOSIS — D500 Iron deficiency anemia secondary to blood loss (chronic): Secondary | ICD-10-CM | POA: Diagnosis not present

## 2024-02-14 DIAGNOSIS — R109 Unspecified abdominal pain: Secondary | ICD-10-CM

## 2024-02-14 DIAGNOSIS — R1013 Epigastric pain: Secondary | ICD-10-CM | POA: Insufficient documentation

## 2024-02-14 DIAGNOSIS — R634 Abnormal weight loss: Secondary | ICD-10-CM | POA: Diagnosis not present

## 2024-02-14 NOTE — Progress Notes (Signed)
 Joyclyn Plazola Faizan Aurelia Gras , M.D. Gastroenterology & Hepatology [facility] Gastroenterology [address] [place], [place] [address] Primary Care Physician: Gladis Mustard, [name] [address] [place] [address]  Chief Complaint: Abdominal pain, unintentional weight loss, iron deficiency anemia  History of Present Illness: Tyler Garner is a 68 y.o. male  with CAD with hx PCI, HTN, HLD, DM type 2, BPH hx TURP, obesity, hx lumbar laminectomy  who presents for evaluation of Abdominal pain, unintentional weight loss, iron deficiency anemia  Patient had recent hospitalization as he was lethargic noted to have hypoglycemia and pyelonephritis.  Patient reports dark stools for past 6 months.  Takes regularly Goody powders for epigastric pain and headaches.  Recently stopped taking Goody powders.  Patient had around 10 pound unintentional weight loss recently as well .the patient denies having any nausea, vomiting, fever, chills,  hematemesis, abdominal distention, , diarrhea, jaundice, pruritus   Labs from 12/2023 phosphorus less than 1 creatinine 1 point BUN 36 Hemoglobin 8.5 MCV 73 Iron less than 10 ferritin 109  Last ZHI:wnwz Last Colonoscopy:none  FHx: neg for any gastrointestinal/liver disease, no malignancies Social: neg smoking, alcohol or illicit drug use Surgical: no abdominal surgeries  Past Medical History: Past Medical History:  Diagnosis Date   Arthritis    hands and hips oa   CAD (coronary artery disease)    a. inferior STEMI (05/2013) with RV involvement- LHC:  dist CFX occluded (Promus premier (2.5x24 mm) DES); EF 40-45% with inf HK;  b. Echo (report pending) with EF 45-50%, lat WMA, RVE, mod RV hypokinesis    Complication of anesthesia 11/28/2020   confusion x 1 hour after 11-28-2020 back surgery   Foley catheter in place    since 11-28-2020 foley last changed 1 week ago as of 03-25-2021   History of kidney stones    Hyperlipidemia     Hypertension    Ischemic cardiomyopathy    Myocardial infarction (HCC) 2015   Nicotine addiction    Renal colic    Right shoulder injury 10/11/2009   Type 2 DM    Urinary retention    since july 2022    Past Surgical History: Past Surgical History:  Procedure Laterality Date   heart stent  06/21/2013   x 1 to distal LCX   LEFT HEART CATHETERIZATION WITH CORONARY ANGIOGRAM N/A 06/21/2013   Procedure: LEFT HEART CATHETERIZATION WITH CORONARY ANGIOGRAM;  Surgeon: Peter M Swaziland, MD;  Location[facility] CATH LAB;  Service: Cardiovascular;  Laterality: N/A;   LUMBAR LAMINECTOMY/DECOMPRESSION MICRODISCECTOMY N/A 11/28/2020   Procedure: LUMBAR DECOMPRESSION  2 LEVELS, L3 and L5;  Surgeon: Burnetta Aures, MD;  Location: [facility] OR;  Service: Orthopedics;  Laterality: N/A;  3.5 hrs   TRANSURETHRAL RESECTION OF PROSTATE N/A 03/28/2021   Procedure: TRANSURETHRAL RESECTION OF THE PROSTATE (TURP)/ BIPOLAR;  Surgeon: Selma Donnice JONELLE, MD;  Location: [facility];  Service: Urology;  Laterality: N/A;    Family History: Family History  Problem Relation Age of Onset   Diabetes Mother    ALS Mother    Alzheimer's disease Father    Healthy Sister    Diabetes Brother     Social History: Social History   Tobacco Use  Smoking Status Former   Current packs/day: 0.00   Average packs/day: 1 pack/day for 40.0 years (40.0 ttl pk-yrs)   Types: Cigarettes   Start date: 05/25/1970   Quit date: 05/25/2010   Years since quitting: 13.7  Smokeless Tobacco Former  Quit date: 05/25/2008   Social History   Substance and Sexual Activity  Alcohol Use Not Currently   Social History   Substance and Sexual Activity  Drug Use No    Allergies: Allergies  Allergen Reactions   Percocet [Oxycodone -Acetaminophen ] Nausea And Vomiting    Medications: Current Outpatient Medications  Medication Sig Dispense Refill   acetaminophen  (TYLENOL ) 500 MG tablet Take 2 tablets (1,000 mg total) by mouth every 6  (six) hours as needed for mild pain (pain score 1-3).     amLODipine  (NORVASC ) 5 MG tablet Take 1 tablet (5 mg total) by mouth daily. 90 tablet 1   aspirin  EC 81 MG tablet Take 81 mg by mouth daily. Swallow whole.     ferrous sulfate  325 (65 FE) MG tablet Take 325 mg by mouth daily with breakfast.     insulin  glargine (LANTUS  SOLOSTAR) 100 UNIT/ML Solostar Pen Inject 20 Units into the skin at bedtime as needed (Only takes for levels over 100).     metoprolol  tartrate (LOPRESSOR ) 25 MG tablet Take 1 tablet (25 mg total) by mouth 2 (two) times daily. 180 tablet 1   pantoprazole  (PROTONIX ) 40 MG tablet Take 1 tablet (40 mg total) by mouth daily. 30 tablet 1   rosuvastatin  (CRESTOR ) 40 MG tablet Take 0.5 tablets (20 mg total) by mouth daily. 135 tablet 1   tamsulosin  (FLOMAX ) 0.4 MG CAPS capsule Take 0.4 mg by mouth at bedtime.     ascorbic acid  (VITAMIN C ) 500 MG tablet Take 1 tablet (500 mg total) by mouth daily with breakfast. (Patient not taking: Reported on 02/14/2024) 30 tablet 2   Multiple Vitamin (MULTIVITAMIN WITH MINERALS) TABS tablet Take 1 tablet by mouth daily. (Patient not taking: Reported on 02/14/2024)     polyethylene glycol (MIRALAX  / GLYCOLAX ) 17 g packet Take 17 g by mouth daily as needed for mild constipation. (Patient not taking: Reported on 02/14/2024) 14 each 1   No current facility-administered medications for this visit.    Review of Systems: GENERAL: negative for malaise, night sweats HEENT: No changes in hearing or vision, no nose bleeds or other nasal problems. NECK: Negative for lumps, goiter, pain and significant neck swelling RESPIRATORY: Negative for cough, wheezing CARDIOVASCULAR: Negative for chest pain, leg swelling, palpitations, orthopnea GI: SEE HPI MUSCULOSKELETAL: Negative for joint pain or swelling, back pain, and muscle pain. SKIN: Negative for lesions, rash HEMATOLOGY Negative for prolonged bleeding, bruising easily, and swollen nodes. ENDOCRINE:  Negative for cold or heat intolerance, polyuria, polydipsia and goiter. NEURO: negative for tremor, gait imbalance, syncope and seizures. The remainder of the review of systems is noncontributory.   Physical Exam: BP 130/70   Pulse 77   Temp (!) 97.1 F (36.2 C)   Ht 5' 10 (1.778 m)   Wt 207 lb 12.8 oz (94.3 kg)   BMI 29.82 kg/m  GENERAL: The patient is AO x3, in no acute distress. HEENT: Head is normocephalic and atraumatic. EOMI are intact. Mouth is well hydrated and without lesions. NECK: Supple. No masses LUNGS: Clear to auscultation. No presence of rhonchi/wheezing/rales. Adequate chest expansion HEART: RRR, normal s1 and s2. ABDOMEN: Soft, nontender, no guarding, no peritoneal signs, and nondistended. BS +. No masses.  Imaging/Labs: as above     Latest Ref Rng & Units 01/22/2024    5:10 AM 01/21/2024    6:26 PM 01/06/2024   10:00 AM  CBC  WBC 4.0 - 10.5 K/uL 7.1  11.7  8.5   Hemoglobin 13.0 -  17.0 g/dL 8.5  9.7  9.3   Hematocrit 39.0 - 52.0 % 28.1  32.3  33.5   Platelets 150 - 400 K/uL 209  225  385    Lab Results  Component Value Date   IRON <10 (L) 01/22/2024   TIBC 291 01/22/2024   FERRITIN 109 01/22/2024    I personally reviewed and interpreted the available labs, imaging and endoscopic files.  IMPRESSION: 1. Progressive right perinephric stranding suggesting unilateral acute on chronic inflammatory process such as pyelonephritis. Correlation with urinalysis and urine culture may be helpful. No hydronephrosis. 2. Stable punctate 1-2 mm nonobstructing calculus within the upper pole the right kidney. 3. Cholelithiasis. 4. Extensive coronary artery calcification.  Valvular calcification 5. Multifactorial severe central canal stenosis at L3 not well characterized on this examination. This could be better assessed with MRI examination if indicated. 6. Aortic atherosclerosis.  Impression and Plan:   Tyler Garner is a 68 y.o. male  with CAD with hx  PCI, HTN, HLD, DM type 2, BPH hx TURP, obesity, hx lumbar laminectomy  who presents for evaluation of Abdominal pain, unintentional weight loss, iron deficiency anemia  # Iron deficiency anemia #Unintentional weight loss # Abdominal pain  Patient had epigastric and lower abdominal pain which could be due to peptic ulcer disease as patient has significant NSAID exposure (Goody powders)  Recent imaging with cholelithiasis and hence could be gallbladder disease  Although patient has evidence of iron deficiency anemia which is an alarm symptom could be peptic ulcer disease but need to rule out malignancy.  No prior upper endoscopy or colonoscopies  As per ACG guidelines , bidirectional endoscopy is recommended over iron replacement therapy only.  We will proceed along with Upper endoscopy with small bowel biopsies and Colonoscopy   If patient pain continues but may refer to surgery for gallbladder disease  Continue PPI daily and avoid NSAIDs  I thoroughly discussed with the patient the procedure, including the risks involved. Patient understands what the procedure involves including the benefits and any risks. Patient understands alternatives to the proposed procedure. Risks including (but not limited to) bleeding, tearing of the lining (perforation), rupture of adjacent organs, problems with heart and lung function, infection, and medication reactions. A small percentage of complications may require surgery, hospitalization, repeat endoscopic procedure, and/or transfusion.  Patient understood and agreed.   All questions were answered.      Jerrid Forgette Faizan Marisol Giambra, MD Gastroenterology and Hepatology [facility] Gastroenterology   This chart has been completed using [facility] Dictation software, and while attempts have been made to ensure accuracy , certain words and phrases may not be transcribed as intended

## 2024-02-14 NOTE — Telephone Encounter (Signed)
 Patient called back, scheduled for 04/07/2024 @ 2pm at [facility], per patient request.

## 2024-02-14 NOTE — Telephone Encounter (Signed)
 1st attempt : Called patient to contact our office to schedule telehealth visit for cardiac clearance.

## 2024-02-14 NOTE — Patient Instructions (Signed)
 It was very nice to meet you today, as dicussed with will plan for the following :  1)protonix  40mg  , 30 min before breakfast every day  2) Stop using high dose aspirin  including Goody/BC powders, NSAIDs such as Aleve, ibuprofen, naproxen, Motrin, Voltaren  or Advil (even the topical ones)  3) upper endoscopy and colonoscopy

## 2024-02-14 NOTE — Telephone Encounter (Signed)
   Name: Tyler Garner  DOB: [birth year]  MRN: [id number]  Primary Cardiologist: None  Chart reviewed as part of pre-operative protocol coverage.Patient has not been seen in our office since 2022. Because of [name]'s past medical history and time since last visit, he will require a follow-up in-office visit in order to better assess preoperative cardiovascular risk.  Pre-op covering staff: - Please schedule appointment and call patient to inform them. If patient already had an upcoming appointment within acceptable timeframe, please add pre-op clearance to the appointment notes so provider is aware. - Please contact requesting surgeon's office via preferred method (i.e, phone, fax) to inform them of need for appointment prior to surgery.   Rennae Ferraiolo E [name], PA-C  02/14/2024, 9:39 AM

## 2024-02-14 NOTE — Telephone Encounter (Signed)
    02/14/24  Delson Dulworth [place] [birth year]  What type of surgery is being performed? Colonoscopy and upper endoscopy  When is surgery scheduled? TBD  What type of clearance is required (medical or pharmacy to hold medication or both? medical  Name of physician performing surgery?  Dr. Cinderella Rouse Gastroenterology at [facility] Phone: [phone] Fax: [phone]  Anethesia type (none, local, MAC, general)? MAC     ? Yes ? No Patient can hold medication as requested   Signature: ___________________________

## 2024-02-16 ENCOUNTER — Telehealth: Payer: Self-pay | Admitting: Student

## 2024-02-16 ENCOUNTER — Other Ambulatory Visit: Payer: Self-pay | Admitting: Urology

## 2024-02-16 NOTE — Telephone Encounter (Signed)
 I will send a message to [facility] scheduling team to see if they can get the pt in sooner in time for preop clearance. Procedure date is 03/13/24. Also after 03/15/24 the pt will be a new pt, which the appt in 03/2024 that was made is incorrect. I will see if scheduling team can move appt sooner.

## 2024-02-16 NOTE — Telephone Encounter (Signed)
   Pre-operative Risk Assessment    Patient Name: Tyler Garner  DOB: 1956/04/03 MRN: [id number]   Date of last office visit: 03/17/21 Date of next office visit: 04/07/24   Request for Surgical Clearance    Procedure:  cystoscopy optilume balloon dilation possible ladder biopsy   Date of Surgery:  Clearance 03/13/24                                Surgeon:  Donnice Day  Surgeon's Group or Practice Name:  [facility] Phone number:  [phone] Fax number:  [phone]   Type of Clearance Requested:   - Pharmacy:  Hold Aspirin  defer to Cards    Type of Anesthesia:  General    Additional requests/questions:  no   SignedHamilton Bergeron   02/16/2024, 9:41 AM

## 2024-02-16 NOTE — Telephone Encounter (Signed)
   Name: Tyler Garner  DOB: [birth year]  MRN: [id number]  Primary Cardiologist: Formerly Dr. Burnard  Chart reviewed as part of pre-operative protocol coverage. Because of Benney Sommerville Hineman's past medical history and time since last visit, he will require a follow-up in-office visit in order to better assess preoperative cardiovascular risk. Has not been seen since 2022. Next office visit 04/07/2024. Will either have to post-pone until seen or be scheduled for a sooner appt, before procedure on 03/13/2024.  Would prefer sooner appointment please.   Pre-op covering staff: - Please schedule appointment and call patient to inform them. If patient already had an upcoming appointment within acceptable timeframe, please add pre-op clearance to the appointment notes so provider is aware. - Please contact requesting surgeon's office via preferred method (i.e, phone, fax) to inform them of need for appointment prior to surgery.    Lamarr Satterfield, NP  02/16/2024, 9:53 AM

## 2024-02-16 NOTE — Telephone Encounter (Signed)
 Pt has appt 03/02/24 Laymon Qua, PAC. Will update all parties involved.

## 2024-02-16 NOTE — Telephone Encounter (Signed)
 CORRECT SPELLING OF SURGEON NAME IS : Dr. [name]   Pt has appt 03/02/24 with Laymon Qua, [facility]

## 2024-02-20 ENCOUNTER — Other Ambulatory Visit: Payer: Self-pay | Admitting: Nurse Practitioner

## 2024-02-20 DIAGNOSIS — M545 Low back pain, unspecified: Secondary | ICD-10-CM

## 2024-02-29 NOTE — Progress Notes (Unsigned)
 Cardiology Office Note    Date:  03/02/2024  ID:  Tyler Garner, DOB 1955-09-08, MRN [id number] Cardiologist: Previously Dr. Burnard (last office visit in 02/2021) :  History of Present Illness:    Tyler Garner is a 68 y.o. male with past medical history of CAD (s/p DES to LCx in 2015), HTN, HLD and IDDM who presents to the office today for overdue follow-up and preoperative cardiac clearance.   He was last examined by Dr. Burnard in 02/2021 and denied any recent anginal symptoms. Activity was limited given back pain and he was needing clearance for TURP, therefore a Lexiscan  Myoview  and echocardiogram were recommended for further assessment.  His NST showed evidence of prior infarct but no current ischemia. EF was read as being reduced at 41% but low-normal EF of 50-55% and WMA noted and due to prior MI. Was cleared to proceed with surgery. He was informed to follow-up in 3 months but has not been evaluated by Cardiology since.   He was admitted to [facility] in 12/2023 after suffering a fall and AMS, felt to be due to hypoglycemia. Also found to have pyelonephritis and treated with IV antibiotics. He reported melena and has been taking NSAIDS, therefore was recommended to stop NSAIDS and follow-up with GI as an outpatient.    The office did receive a preop clearance request for EGD and colonoscopy in 01/2024 along with a second request later in the month for cystoscopy and possible bladder biopsy. Due to the timeframe since his last visit, a follow-up appointment was arranged.   In talking with the patient today, he reports his activity is limited due to back/hip pain but he does walk to his mailbox several times a day and climbs a full flight of stairs down to his basement without any chest pain or dyspnea on exertion.  Reports his most limiting factor is hip pain.  He denies any palpitations, orthopnea, PND or pitting edema.  Overall asymptomatic with his PVCs today.  He has quit taking  Goody's powders along with additional NSAIDs and is mostly using Tylenol  as needed pending GI workup.  Studies Reviewed:   EKG: EKG is ordered today and demonstrates:   EKG Interpretation Date/Time:  Thursday March 02 2024 08:37:04 EDT Ventricular Rate:  85 PR Interval:  140 QRS Duration:  92 QT Interval:  382 QTC Calculation: 454 R Axis:   95  Text Interpretation: Sinus rhythm with frequent Premature ventricular complexes Rightward axis Inferior infarct pattern Confirmed by Johnson Grate ([id number]) on 03/02/2024 8:58:12 AM       NST: 02/2021   Findings are consistent with prior myocardial infarction and no prior ischemia. The study is intermediate risk due to prior infarct and reduced systolic function.   No ST deviation was noted.   LV perfusion is abnormal. There is no evidence of ischemia. There is evidence of infarction. Defect 1: There is a small defect with moderate reduction in uptake present in the mid to basal inferoseptal location(s) that is fixed. There is abnormal wall motion in the defect area. Consistent with infarction. Defect 2: There is a medium defect with severe reduction in uptake present in the apical to basal inferior location(s) that is fixed. There is abnormal wall motion in the defect area. Consistent with infarction.   Left ventricular function is abnormal. Nuclear stress EF: 41 %. The left ventricular ejection fraction is moderately decreased (30-44%). End diastolic cavity size is moderately enlarged. End systolic cavity size is mildly enlarged.  Prior study not available for comparison.   Prior inferior infarct with no evidence of ischemia.    Echocardiogram: 02/2021 IMPRESSIONS     1. Left ventricular ejection fraction, by estimation, is 50 to 55%. The  left ventricle has low normal function. The left ventricle demonstrates  regional wall motion abnormalities (see scoring diagram/findings for  description). Due to poor apical window,   walls were  only able to be assessed in short axis. Indeterminate  diastolic function.   2. Right ventricular systolic function is normal. The right ventricular  size is normal. Tricuspid regurgitation signal is inadequate for assessing  PA pressure.   3. Left atrial size was mildly dilated.   4. The mitral valve is degenerative. Trivial mitral valve regurgitation.  No evidence of mitral stenosis.   5. The aortic valve is abnormal. Unable to determine aortic valve  morphology due to image quality. There is moderate calcification of the  aortic valve. Aortic valve regurgitation is not visualized. Mild to  moderate aortic valve sclerosis/calcification  is present, without any evidence of aortic stenosis. Aortic valve mean  gradient measures 8.3 mmHg.   6. The inferior vena cava is normal in size with greater than 50%  respiratory variability, suggesting right atrial pressure of 3 mmHg.    Risk Assessment/Calculations:   {Does this patient have ATRIAL FIBRILLATION?:[phone]}           Physical Exam:   VS:  BP 132/74   Pulse 85   Ht 5' 10 (1.778 m)   Wt 213 lb 9.6 oz (96.9 kg)   SpO2 98%   BMI 30.65 kg/m    Wt Readings from Last 3 Encounters:  03/02/24 213 lb 9.6 oz (96.9 kg)  02/14/24 207 lb 12.8 oz (94.3 kg)  01/21/24 217 lb 6 oz (98.6 kg)     GEN: Well nourished, well developed in no acute distress NECK: No JVD; No carotid bruits CARDIAC: ***RRR, no murmurs, rubs, gallops RESPIRATORY:  Clear to auscultation without rales, wheezing or rhonchi  ABDOMEN: Appears non-distended. No obvious abdominal masses. EXTREMITIES: No clubbing or cyanosis. No edema.  Distal pedal pulses are 2+ bilaterally.   Assessment and Plan:   1. Preoperative cardiovascular examination ***  2. Coronary artery disease involving native coronary artery of native heart without angina pectoris ***  3. Essential hypertension ***  4. Hyperlipidemia LDL goal <70 ***    Signed, Laymon CHRISTELLA Qua,  PA-C

## 2024-03-02 ENCOUNTER — Ambulatory Visit: Attending: Student | Admitting: Student

## 2024-03-02 ENCOUNTER — Encounter: Payer: Self-pay | Admitting: Student

## 2024-03-02 VITALS — BP 132/74 | HR 85 | Ht 70.0 in | Wt 213.6 lb

## 2024-03-02 DIAGNOSIS — I493 Ventricular premature depolarization: Secondary | ICD-10-CM

## 2024-03-02 DIAGNOSIS — Z79899 Other long term (current) drug therapy: Secondary | ICD-10-CM | POA: Diagnosis not present

## 2024-03-02 DIAGNOSIS — E785 Hyperlipidemia, unspecified: Secondary | ICD-10-CM | POA: Diagnosis not present

## 2024-03-02 DIAGNOSIS — I2510 Atherosclerotic heart disease of native coronary artery without angina pectoris: Secondary | ICD-10-CM | POA: Diagnosis not present

## 2024-03-02 DIAGNOSIS — I10 Essential (primary) hypertension: Secondary | ICD-10-CM | POA: Diagnosis not present

## 2024-03-02 DIAGNOSIS — Z01810 Encounter for preprocedural cardiovascular examination: Secondary | ICD-10-CM

## 2024-03-02 NOTE — Patient Instructions (Signed)
 Medication Instructions:   Hold Aspirin  5 Days prior to surgery   *If you need a refill on your cardiac medications before your next appointment, please call your pharmacy*  Lab Work: Your physician recommends that you return for lab work at next lab draw. ( CBC, BMET, Mg )   If you have labs (blood work) drawn today and your tests are completely normal, you will receive your results only by: MyChart Message (if you have MyChart) OR A paper copy in the mail If you have any lab test that is abnormal or we need to change your treatment, we will call you to review the results.  Testing/Procedures: Your physician has requested that you have an echocardiogram. Echocardiography is a painless test that uses sound waves to create images of your heart. It provides your doctor with information about the size and shape of your heart and how well your heart's chambers and valves are working. This procedure takes approximately one hour. There are no restrictions for this procedure. Please do NOT wear cologne, perfume, aftershave, or lotions (deodorant is allowed). Please arrive 15 minutes prior to your appointment time.  Please note: We ask at that you not bring children with you during ultrasound (echo/ vascular) testing. Due to room size and safety concerns, children are not allowed in the ultrasound rooms during exams. Our front office staff cannot provide observation of children in our lobby area while testing is being conducted. An adult accompanying a patient to their appointment will only be allowed in the ultrasound room at the discretion of the ultrasound technician under special circumstances. We apologize for any inconvenience.   Follow-Up: At [facility], you and your health needs are our priority.  As part of our continuing mission to provide you with exceptional heart care, our providers are all part of one team.  This team includes your primary Cardiologist (physician) and Advanced  Practice Providers or APPs (Physician Assistants and Nurse Practitioners) who all work together to provide you with the care you need, when you need it.  Your next appointment:   2-3 month(s)  Provider:   Laymon Qua, PA-C    We recommend signing up for the patient portal called MyChart.  Sign up information is provided on this After Visit Summary.  MyChart is used to connect with patients for Virtual Visits (Telemedicine).  Patients are able to view lab/test results, encounter notes, upcoming appointments, etc.  Non-urgent messages can be sent to your provider as well.   To learn more about what you can do with MyChart, go to [email or web address].   Other Instructions Thank you for choosing [facility]!

## 2024-03-03 ENCOUNTER — Encounter: Payer: Self-pay | Admitting: Student

## 2024-03-05 NOTE — Patient Instructions (Signed)
 SURGICAL WAITING ROOM VISITATION Patients having surgery or a procedure may have no more than 2 support people in the waiting area - these visitors may rotate in the visitor waiting room.   If the patient needs to stay at the hospital during part of their recovery, the visitor guidelines for inpatient rooms apply.  PRE-OP VISITATION  Pre-op nurse will coordinate an appropriate time for 1 support person to accompany the patient in pre-op.  This support person may not rotate.  This visitor will be contacted when the time is appropriate for the visitor to come back in the pre-op area.  Please refer to the [facility] website for the visitor guidelines for Inpatients (after your surgery is over and you are in a regular room).  You are not required to quarantine at this time prior to your surgery. However, you must do this: Hand Hygiene often Do NOT share personal items Notify your provider if you are in close contact with someone who has COVID or you develop fever 100.4 or greater, new onset of sneezing, cough, sore throat, shortness of breath or body aches.  If you test positive for Covid or have been in contact with anyone that has tested positive in the last 10 days please notify you surgeon.    Your procedure is scheduled on:  MONDAY  March 13, 2024   Report to [facility] Main Entrance: [address] entrance where the [employer] is available.   Report to admitting at:  1:00 PM  Call this number if you have any questions or problems the morning of surgery [phone]  DO NOT EAT  ANYTHING AFTER MIDNIGHT THE NIGHT PRIOR TO YOUR SURGERY / PROCEDURE.  After Midnight you may have Water, or Sports drinks like Gatorade or Powerade (No RED color) until 10:00 am the morning of your surgery.  After 10:00 am, do not drink or eat anything; No candy, chewing gum or throat lozenges.  FOLLOW  ANY ADDITIONAL PRE OP INSTRUCTIONS YOU RECEIVED FROM YOUR SURGEON'S OFFICE!!!   Oral Hygiene is also  important to reduce your risk of infection.        Remember - BRUSH YOUR TEETH THE MORNING OF SURGERY WITH YOUR REGULAR TOOTHPASTE  Do NOT smoke after Midnight the night before surgery.  STOP TAKING all Vitamins, Herbs and supplements 1 week before your surgery.   Take ONLY these medicines the morning of surgery with A SIP OF WATER: Pantoprazole , amlodipine , metoprolol  and tylenol  if needed.   You may not have any metal on your body including  jewelry, and body piercing  Do not wear lotions, powders, cologne, or deodorant  Men may shave face and neck.  Contacts, Hearing Aids, dentures or bridgework may not be worn into surgery. DENTURES WILL BE REMOVED PRIOR TO SURGERY PLEASE DO NOT APPLY Poly grip OR ADHESIVES!!!  Patients discharged on the day of surgery will not be allowed to drive home.  Someone NEEDS to stay with you for the first 24 hours after anesthesia.  Do not bring your home medications to the hospital. The Pharmacy will dispense medications listed on your medication list to you during your admission in the Hospital.  Please read over the following fact sheets you were given: IF YOU HAVE QUESTIONS ABOUT YOUR PRE-OP INSTRUCTIONS, PLEASE CALL [phone].   [facility] - Preparing for Surgery      Before surgery, you can play an important role.  Because skin is not sterile, your skin needs to be as free of  germs as possible.  You can reduce the number of germs on your skin by washing with CHG (chlorahexidine gluconate) soap before surgery.  CHG is an antiseptic cleaner which kills germs and bonds with the skin to continue killing germs even after washing. Please DO NOT use if you have an allergy to CHG or antibacterial soaps.  If your skin becomes reddened/irritated stop using the CHG and inform your nurse when you arrive at Short Stay. Do not shave (including legs and underarms) for at least 48 hours prior to the first CHG shower.  You may shave your face/neck.  Please  follow these instructions carefully:  1.  Shower with CHG Soap the night before surgery ONLY (DO NOT USE THE CHG SOAP THE MORNING OF SURGERY).  2.  If you choose to wash your hair, wash your hair first as usual with your normal  shampoo.  3.  After you shampoo, rinse your hair and body thoroughly to remove the shampoo.                             4.  Use CHG as you would any other liquid soap.  You can apply chg directly to the skin and wash.  Gently with a scrungie or clean washcloth.  5.  Apply the CHG Soap to your body ONLY FROM THE NECK DOWN.   Do not use on face/ open                           Wound or open sores. Avoid contact with eyes, ears mouth and genitals (private parts).                       Wash face,  Genitals (private parts) with your normal soap.             6.  Wash thoroughly, paying special attention to the area where your  surgery  will be performed.  7.  Thoroughly rinse your body with warm water from the neck down.  8.  DO NOT shower/wash with your normal soap after using and rinsing off the CHG Soap.             9.  Pat yourself dry with a clean towel.            10.  Wear clean pajamas.            11.  Place clean sheets on your bed the night of your first shower and do not  sleep with pets.  Day of Surgery : Do not apply any CHG, lotions/deodorants the morning of surgery.  Please wear clean clothes to the hospital/surgery center.   FAILURE TO FOLLOW THESE INSTRUCTIONS MAY RESULT IN THE CANCELLATION OF YOUR SURGERY  PATIENT SIGNATURE_________________________________  NURSE SIGNATURE__________________________________  ________________________________________________________________________

## 2024-03-05 NOTE — Progress Notes (Signed)
 COVID Vaccine received:  []  No [x]  Yes Date of any COVID positive Test in last 28 days:None  PCP - [name] [name] Cardiologist - Cathy Sor, MD) Tyler Garner, [place] 03-03-24 cardiac clearance note says clearance is pending ECHO results on 03-10-2024  Chest x-ray - 09-13-2023  EKG -  03-02-2024  Stress Test - 03-19-2021  ECHO - 03/21/2021  repeat sched. 03/10/24  Cardiac Cath - 06-21-2013 LHC- DES x 1 to LCx by Dr. Swaziland CT Coronary Calcium  score:   Bowel Prep - [x]  No  []   Yes ______  Pacemaker / ICD device [x]  No []  Yes   Spinal Cord Stimulator:[x]  No []  Yes       History of Sleep Apnea? [x]  No []  Yes   CPAP used?- [x]  No []  Yes    Patient has: []  NO Hx DM   []  Pre-DM   []  DM1  [x]   DM2 Does the patient monitor blood sugar?   []  N/A   []  No [x]  Yes  Last A1c was:  6.8 on  12-10-23    Does patient have a [employer] or Dexcom? [x]  No []  Yes   Fasting Blood Sugar Ranges- 90-115 Checks Blood Sugar _1 times a day  METFORMIN  - Hold DOS INSULIN -Glargine-  Blood Thinner / Instructions:none Aspirin  Instructions:  ASA 81 mg   Hold x 5 days last will be on Tuesday 10-14  Dental hx: []  Dentures:  []  N/A      []  Bridge or Partial:                   [x]  Loose or Damaged teeth:   Activity level: Able to walk up 2 flights of stairs without becoming significantly short of breath or having chest pain?  []  No   [x]    Yes  Patient can perform ADLs without assistance. []  No   [x]   Yes  Anesthesia review: CAD- Hx STEMI- DES x 1 to LCx in 2015, DM2, HTN, CHF, Anemia  Has ECHO scheduled for 03-10-24, cardiac clearance is pending results  Patient denies any S&S of respiratory illness or Covid - no shortness of breath, fever, cough or chest pain at PAT appointment.  Patient verbalized understanding and agreement to the Pre-Surgical Instructions that were given to them at this PAT appointment. Patient was also educated of the need to review these PAT instructions again prior to  his surgery.I reviewed the appropriate phone numbers to call if they have any and questions or concerns.

## 2024-03-07 ENCOUNTER — Encounter (HOSPITAL_COMMUNITY): Payer: Self-pay

## 2024-03-07 ENCOUNTER — Other Ambulatory Visit: Payer: Self-pay

## 2024-03-07 ENCOUNTER — Encounter (HOSPITAL_COMMUNITY)
Admission: RE | Admit: 2024-03-07 | Discharge: 2024-03-07 | Disposition: A | Discharge status: Home / Self Care | Source: Ambulatory Visit | Attending: Urology | Admitting: Urology

## 2024-03-07 VITALS — BP 122/77 | HR 82 | Temp 98.4°F | Resp 16 | Ht 70.0 in | Wt 210.0 lb

## 2024-03-07 DIAGNOSIS — Z01818 Encounter for other preprocedural examination: Secondary | ICD-10-CM

## 2024-03-07 DIAGNOSIS — N35912 Unspecified bulbous urethral stricture, male: Secondary | ICD-10-CM | POA: Insufficient documentation

## 2024-03-07 DIAGNOSIS — I129 Hypertensive chronic kidney disease with stage 1 through stage 4 chronic kidney disease, or unspecified chronic kidney disease: Secondary | ICD-10-CM | POA: Diagnosis not present

## 2024-03-07 DIAGNOSIS — I2510 Atherosclerotic heart disease of native coronary artery without angina pectoris: Secondary | ICD-10-CM | POA: Diagnosis not present

## 2024-03-07 DIAGNOSIS — N189 Chronic kidney disease, unspecified: Secondary | ICD-10-CM | POA: Insufficient documentation

## 2024-03-07 DIAGNOSIS — N35919 Unspecified urethral stricture, male, unspecified site: Secondary | ICD-10-CM | POA: Diagnosis not present

## 2024-03-07 DIAGNOSIS — I255 Ischemic cardiomyopathy: Secondary | ICD-10-CM | POA: Insufficient documentation

## 2024-03-07 DIAGNOSIS — Z794 Long term (current) use of insulin: Secondary | ICD-10-CM | POA: Insufficient documentation

## 2024-03-07 DIAGNOSIS — Z01812 Encounter for preprocedural laboratory examination: Secondary | ICD-10-CM | POA: Insufficient documentation

## 2024-03-07 DIAGNOSIS — E119 Type 2 diabetes mellitus without complications: Secondary | ICD-10-CM | POA: Insufficient documentation

## 2024-03-07 DIAGNOSIS — E1122 Type 2 diabetes mellitus with diabetic chronic kidney disease: Secondary | ICD-10-CM | POA: Insufficient documentation

## 2024-03-07 HISTORY — DX: Chronic kidney disease, unspecified: N18.9

## 2024-03-07 LAB — CBC
HCT: 41.8 % (ref 39.0–52.0)
Hemoglobin: 12.2 g/dL — ABNORMAL LOW (ref 13.0–17.0)
MCH: 23.8 pg — ABNORMAL LOW (ref 26.0–34.0)
MCHC: 29.2 g/dL — ABNORMAL LOW (ref 30.0–36.0)
MCV: 81.6 fL (ref 80.0–100.0)
Platelets: 294 K/uL (ref 150–400)
RBC: 5.12 MIL/uL (ref 4.22–5.81)
RDW: 22.3 % — ABNORMAL HIGH (ref 11.5–15.5)
WBC: 6.8 K/uL (ref 4.0–10.5)
nRBC: 0 % (ref 0.0–0.2)

## 2024-03-07 LAB — HEMOGLOBIN A1C
Hgb A1c MFr Bld: 6.4 % — ABNORMAL HIGH (ref 4.8–5.6)
Mean Plasma Glucose: 136.98 mg/dL

## 2024-03-07 LAB — BASIC METABOLIC PANEL WITH GFR
Anion gap: 13 (ref 5–15)
BUN: 13 mg/dL (ref 8–23)
CO2: 25 mmol/L (ref 22–32)
Calcium: 9.5 mg/dL (ref 8.9–10.3)
Chloride: 101 mmol/L (ref 98–111)
Creatinine, Ser: 0.87 mg/dL (ref 0.61–1.24)
GFR, Estimated: >60 mL/min (ref >60)
Glucose, Bld: 169 mg/dL — ABNORMAL HIGH (ref 70–99)
Potassium: 5.6 mmol/L — ABNORMAL HIGH (ref 3.5–5.1)
Sodium: 140 mmol/L (ref 135–145)

## 2024-03-07 LAB — GLUCOSE, CAPILLARY: Glucose-Capillary: 169 mg/dL — ABNORMAL HIGH (ref 70–99)

## 2024-03-10 ENCOUNTER — Ambulatory Visit: Payer: Self-pay | Admitting: Student

## 2024-03-10 ENCOUNTER — Ambulatory Visit (HOSPITAL_COMMUNITY)
Admission: RE | Admit: 2024-03-10 | Discharge: 2024-03-10 | Disposition: A | Discharge status: Home / Self Care | Source: Ambulatory Visit | Attending: Student | Admitting: Student

## 2024-03-10 ENCOUNTER — Telehealth: Payer: Self-pay | Admitting: Student

## 2024-03-10 DIAGNOSIS — I509 Heart failure, unspecified: Secondary | ICD-10-CM | POA: Insufficient documentation

## 2024-03-10 DIAGNOSIS — Z87891 Personal history of nicotine dependence: Secondary | ICD-10-CM | POA: Diagnosis not present

## 2024-03-10 DIAGNOSIS — E785 Hyperlipidemia, unspecified: Secondary | ICD-10-CM | POA: Insufficient documentation

## 2024-03-10 DIAGNOSIS — I429 Cardiomyopathy, unspecified: Secondary | ICD-10-CM | POA: Insufficient documentation

## 2024-03-10 DIAGNOSIS — I252 Old myocardial infarction: Secondary | ICD-10-CM | POA: Insufficient documentation

## 2024-03-10 DIAGNOSIS — I110 Hypertensive heart disease with heart failure: Secondary | ICD-10-CM | POA: Insufficient documentation

## 2024-03-10 DIAGNOSIS — I10 Essential (primary) hypertension: Secondary | ICD-10-CM

## 2024-03-10 DIAGNOSIS — E119 Type 2 diabetes mellitus without complications: Secondary | ICD-10-CM | POA: Diagnosis not present

## 2024-03-10 DIAGNOSIS — I358 Other nonrheumatic aortic valve disorders: Secondary | ICD-10-CM | POA: Diagnosis not present

## 2024-03-10 DIAGNOSIS — I493 Ventricular premature depolarization: Secondary | ICD-10-CM | POA: Insufficient documentation

## 2024-03-10 LAB — ECHOCARDIOGRAM COMPLETE
AR max vel: 2.2 cm2
AV Area VTI: 2.55 cm2
AV Area mean vel: 2.32 cm2
AV Mean grad: 5.7 mmHg
AV Peak grad: 11.5 mmHg
Ao pk vel: 1.7 m/s
Area-P 1/2: 4.31 cm2
Est EF: 55
S' Lateral: 3.9 cm

## 2024-03-10 NOTE — Telephone Encounter (Signed)
     His echo showed a preserved EF of 55% with no wall motion abnormalities. Right ventricular function was normal. He was noted to have bulky calcification of the aortic valve but no aortic stenosis or regurgitation. I reviewed the report with Dr. Delford who read the study and no evidence of mass or thrombus, was just bulky calcium . He is of acceptable risk to proceed with surgery from a cardiac perspective.  Signed, Laymon CHRISTELLA Qua, PA-C 03/10/2024, 2:19 PM Pager: [phone]

## 2024-03-10 NOTE — Anesthesia Preprocedure Evaluation (Signed)
 Anesthesia Evaluation  Patient identified by MRN, date of birth, ID band Patient awake    Reviewed: Allergy & Precautions, H&P , NPO status , Patient's Chart, lab work & pertinent test results, reviewed documented beta blocker date and time   Airway Mallampati: II  TM Distance: >3 FB Neck ROM: Full    Dental no notable dental hx. (+) Teeth Intact, Dental Advisory Given   Pulmonary former smoker   Pulmonary exam normal breath sounds clear to auscultation       Cardiovascular hypertension, Pt. on medications and Pt. on home beta blockers + CAD, + Past MI, + Cardiac Stents and +CHF   Rhythm:Regular Rate:Normal     Neuro/Psych negative neurological ROS  negative psych ROS   GI/Hepatic negative GI ROS, Neg liver ROS,,,  Endo/Other  diabetes, Insulin  Dependent, Oral Hypoglycemic Agents    Renal/GU negative Renal ROS  negative genitourinary   Musculoskeletal  (+) Arthritis , Osteoarthritis,    Abdominal   Peds  Hematology  (+) Blood dyscrasia, anemia   Anesthesia Other Findings   Reproductive/Obstetrics negative OB ROS                              Anesthesia Physical Anesthesia Plan  ASA: 3  Anesthesia Plan: General   Post-op Pain Management: Tylenol  PO (pre-op)*   Induction: Intravenous  PONV Risk Score and Plan: 3 and Ondansetron , Dexamethasone  and Treatment may vary due to age or medical condition  Airway Management Planned: LMA  Additional Equipment:   Intra-op Plan:   Post-operative Plan: Extubation in OR  Informed Consent: I have reviewed the patients History and Physical, chart, labs and discussed the procedure including the risks, benefits and alternatives for the proposed anesthesia with the patient or authorized representative who has indicated his/her understanding and acceptance.     Dental advisory given  Plan Discussed with: CRNA  Anesthesia Plan Comments: (See  PAT note 03/07/24)         Anesthesia Quick Evaluation

## 2024-03-10 NOTE — Telephone Encounter (Signed)
 [facility] calling to ask if pt has been cleared. Requesting clearance be faxed if so. Please advise.   [facility]  Fax: [phone]

## 2024-03-10 NOTE — Telephone Encounter (Signed)
   Patient Name: Tyler Garner  DOB: July 16, 1955 MRN: [id number]  Primary Cardiologist: None  Chart reviewed as part of pre-operative protocol coverage. Given past medical history and time since last visit, based on ACC/AHA guidelines, Tyler Garner is at acceptable risk for the planned procedure without further cardiovascular testing.   His echo showed a preserved EF of 55% with no wall motion abnormalities. Right ventricular function was normal. He was noted to have bulky calcification of the aortic valve but no aortic stenosis or regurgitation. I reviewed the report with Dr. Delford who read the study and no evidence of mass or thrombus, was just bulky calcium . He is of acceptable risk to proceed with surgery from a cardiac perspective.   The patient was advised that if he develops new symptoms prior to surgery to contact our office to arrange for a follow-up visit, and he verbalized understanding.  I will route this recommendation to the requesting party via Epic fax function and remove from pre-op pool.  Please call with questions.  Lamarr Satterfield, NP 03/10/2024, 2:31 PM

## 2024-03-10 NOTE — Telephone Encounter (Signed)
 [place],  You saw this patient on 03/02/2024. Per protocol we request that you comment on his cardiac risk to proceed with  cystoscopy optilume balloon dilation possible ladder biopsy.  You mentioned this in your note, but ordered echo before clearing him for surgery.  Echo showed abnormal AoV.  Can you comment on proceeding with the cystoscopy optilume balloon dilation possible ladder biopsy since it has been less than 2 months since evaluated in the office. Please send your comment to P CV Pre-Op Pool.  Thank you, Lamarr Satterfield DNP, ANP, AACC.

## 2024-03-10 NOTE — Progress Notes (Signed)
*  PRELIMINARY RESULTS* Echocardiogram 2D Echocardiogram has been performed.  Tyler Garner 03/10/2024, 9:21 AM

## 2024-03-10 NOTE — Progress Notes (Signed)
 Anesthesia Chart Review   Case: [id number] Date/Time: 03/13/24 1500   Procedures:      CYSTOURETHROSCOPY, WITH URETHRAL STRICTURE DILATION USING DRUG-COATED BALLOON     CYSTOSCOPY, WITH BIOPSY   Anesthesia type: General   Diagnosis: Stricture of male urethra, unspecified stricture type [N35.919]   Pre-op diagnosis: BULBAR URETHRAL STRICTURE   Location: [facility] PROCEDURE [facility] / [facility]   Surgeons: Selma Donnice SAUNDERS, MD       DISCUSSION:68 y.o. former smoker with h/o HTN, CAD (s/p DES to LCx in 2015), ischemic cardiomyopathy, insulin  dependent DM II, CKD, bulbar urethral stricture scheduled for above procedure 03/13/24 with Dr. Donnice Selma.   Pt last seen by cardiology 03/02/2024. Per OV note,  He is needing cardiac clearance for EGD/colonoscopy which is not yet scheduled but also for cystoscopy which is scheduled for 03/13/2024. While he is able to perform more than 4 METS of activity without anginal symptoms, he has been noted to have frequent PVC's on EKG tracings over the past few months and episodes of bigeminy which are new for him. Will plan to obtain an echocardiogram next week prior to surgery to make sure no significant structural abnormalities. If reassuring, he would be cleared to proceed with both procedures. RCRI Risk is overall at 5% risk of a major cardiac event. Would continue ASA 81mg  daily in the perioperative setting if able but if needing to be held, can hold for 5 days. Will route today's note to the requesting providers.  Echo 03/09/2024 with EF 55%, trivial mitral valve regurgitation.  VS: BP 122/77 Comment: right arm sitting  Pulse 82   Temp 36.9 C (Oral)   Resp 16 Comment: right arm sitting  Ht 5' 10 (1.778 m)   Wt 95.3 kg   SpO2 98%   BMI 30.13 kg/m   PROVIDERS: Gladis Mustard, [name] is PCP    LABS: Labs reviewed: Acceptable for surgery. (all labs ordered are listed, but only abnormal results are displayed)  Labs Reviewed  HEMOGLOBIN A1C - Abnormal;  Notable for the following components:      Result Value   Hgb A1c MFr Bld 6.4 (*)    All other components within normal limits  BASIC METABOLIC PANEL WITH GFR - Abnormal; Notable for the following components:   Potassium 5.6 (*)    Glucose, Bld 169 (*)    All other components within normal limits  CBC - Abnormal; Notable for the following components:   Hemoglobin 12.2 (*)    MCH 23.8 (*)    MCHC 29.2 (*)    RDW 22.3 (*)    All other components within normal limits  GLUCOSE, CAPILLARY - Abnormal; Notable for the following components:   Glucose-Capillary 169 (*)    All other components within normal limits     IMAGES:   EKG:   CV: Echo 03/10/24 1. Left ventricular ejection fraction, by estimation, is 55%. The left  ventricle has normal function. The left ventricle has no regional wall  motion abnormalities. The left ventricular internal cavity size was mildly  dilated. Left ventricular diastolic  parameters are consistent with Grade I diastolic dysfunction (impaired  relaxation).   2. Right ventricular systolic function is normal. The right ventricular  size is normal.   3. Left atrial size was moderately dilated.   4. The mitral valve is abnormal. Trivial mitral valve regurgitation. No  evidence of mitral stenosis.   5. Bulky calcification particulary of the non coronary cusp no AS with  calculated AVA 2.2  cm2. The aortic valve is tricuspid. There is severe  calcifcation of the aortic valve. Aortic valve regurgitation is not  visualized. Aortic valve  sclerosis/calcification is present, without any evidence of aortic  stenosis.   6. The inferior vena cava is normal in size with greater than 50%  respiratory variability, suggesting right atrial pressure of 3 mmHg.   Myocardial Perfusion 03/19/2021   Findings are consistent with prior myocardial infarction and no prior ischemia. The study is intermediate risk due to prior infarct and reduced systolic function.   No ST  deviation was noted.   LV perfusion is abnormal. There is no evidence of ischemia. There is evidence of infarction. Defect 1: There is a small defect with moderate reduction in uptake present in the mid to basal inferoseptal location(s) that is fixed. There is abnormal wall motion in the defect area. Consistent with infarction. Defect 2: There is a medium defect with severe reduction in uptake present in the apical to basal inferior location(s) that is fixed. There is abnormal wall motion in the defect area. Consistent with infarction.   Left ventricular function is abnormal. Nuclear stress EF: 41 %. The left ventricular ejection fraction is moderately decreased (30-44%). End diastolic cavity size is moderately enlarged. End systolic cavity size is mildly enlarged.   Prior study not available for comparison.   Prior inferior infarct with no evidence of ischemia.  Past Medical History:  Diagnosis Date   Arthritis    hands and hips oa   CAD (coronary artery disease)    a. inferior STEMI (05/2013) with RV involvement- LHC:  dist CFX occluded (Promus premier (2.5x24 mm) DES); EF 40-45% with inf HK;  b. Echo (report pending) with EF 45-50%, lat WMA, RVE, mod RV hypokinesis    Chronic kidney disease    Complication of anesthesia 11/28/2020   confusion x 1 hour after 11-28-2020 back surgery   Foley catheter in place    since 11-28-2020 foley last changed 1 week ago as of 03-25-2021   History of kidney stones    Hyperlipidemia    Hypertension    Ischemic cardiomyopathy    Myocardial infarction (HCC) 2015   Nicotine addiction    Renal colic    Right shoulder injury 10/11/2009   Type 2 DM    Urinary retention    since july 2022    Past Surgical History:  Procedure Laterality Date   heart stent  06/21/2013   x 1 to distal LCX   LEFT HEART CATHETERIZATION WITH CORONARY ANGIOGRAM N/A 06/21/2013   Procedure: LEFT HEART CATHETERIZATION WITH CORONARY ANGIOGRAM;  Surgeon: Peter M Swaziland, MD;  Location:  [facility] CATH LAB;  Service: Cardiovascular;  Laterality: N/A;   LUMBAR LAMINECTOMY/DECOMPRESSION MICRODISCECTOMY N/A 11/28/2020   Procedure: LUMBAR DECOMPRESSION  2 LEVELS, L3 and L5;  Surgeon: Burnetta Aures, MD;  Location: [facility] OR;  Service: Orthopedics;  Laterality: N/A;  3.5 hrs   TRANSURETHRAL RESECTION OF PROSTATE N/A 03/28/2021   Procedure: TRANSURETHRAL RESECTION OF THE PROSTATE (TURP)/ BIPOLAR;  Surgeon: Selma Donnice SAUNDERS, MD;  Location: [facility];  Service: Urology;  Laterality: N/A;    MEDICATIONS:  acetaminophen  (TYLENOL ) 500 MG tablet   amLODipine  (NORVASC ) 5 MG tablet   aspirin  EC 81 MG tablet   ferrous sulfate  325 (65 FE) MG tablet   insulin  glargine (LANTUS  SOLOSTAR) 100 UNIT/ML Solostar Pen   metFORMIN  (GLUCOPHAGE ) 1000 MG tablet   metoprolol  tartrate (LOPRESSOR ) 25 MG tablet   pantoprazole  (PROTONIX ) 40 MG tablet  rosuvastatin  (CRESTOR ) 40 MG tablet   tamsulosin  (FLOMAX ) 0.4 MG CAPS capsule   No current facility-administered medications for this encounter.     Harlene Hoots Ward, PA-C [facility] Pre-Surgical Testing [phone]

## 2024-03-13 ENCOUNTER — Ambulatory Visit (HOSPITAL_COMMUNITY)

## 2024-03-13 ENCOUNTER — Ambulatory Visit (HOSPITAL_COMMUNITY): Payer: Self-pay | Admitting: Physician Assistant

## 2024-03-13 ENCOUNTER — Encounter (HOSPITAL_COMMUNITY)
Admission: RE | Disposition: A | Discharge status: Home / Self Care | Payer: Self-pay | Source: Home / Self Care | Attending: Urology

## 2024-03-13 ENCOUNTER — Ambulatory Visit (HOSPITAL_COMMUNITY): Admitting: Certified Registered Nurse Anesthetist

## 2024-03-13 ENCOUNTER — Encounter (HOSPITAL_COMMUNITY): Payer: Self-pay | Admitting: Urology

## 2024-03-13 ENCOUNTER — Ambulatory Visit (HOSPITAL_COMMUNITY)
Admission: RE | Admit: 2024-03-13 | Discharge: 2024-03-13 | Disposition: A | Discharge status: Home / Self Care | Attending: Urology | Admitting: Urology

## 2024-03-13 DIAGNOSIS — I110 Hypertensive heart disease with heart failure: Secondary | ICD-10-CM | POA: Diagnosis not present

## 2024-03-13 DIAGNOSIS — M51369 Other intervertebral disc degeneration, lumbar region without mention of lumbar back pain or lower extremity pain: Secondary | ICD-10-CM | POA: Insufficient documentation

## 2024-03-13 DIAGNOSIS — R3912 Poor urinary stream: Secondary | ICD-10-CM | POA: Diagnosis not present

## 2024-03-13 DIAGNOSIS — Z955 Presence of coronary angioplasty implant and graft: Secondary | ICD-10-CM | POA: Diagnosis not present

## 2024-03-13 DIAGNOSIS — I2510 Atherosclerotic heart disease of native coronary artery without angina pectoris: Secondary | ICD-10-CM | POA: Diagnosis not present

## 2024-03-13 DIAGNOSIS — Z794 Long term (current) use of insulin: Secondary | ICD-10-CM | POA: Insufficient documentation

## 2024-03-13 DIAGNOSIS — N401 Enlarged prostate with lower urinary tract symptoms: Secondary | ICD-10-CM | POA: Insufficient documentation

## 2024-03-13 DIAGNOSIS — I252 Old myocardial infarction: Secondary | ICD-10-CM | POA: Insufficient documentation

## 2024-03-13 DIAGNOSIS — E785 Hyperlipidemia, unspecified: Secondary | ICD-10-CM | POA: Insufficient documentation

## 2024-03-13 DIAGNOSIS — Z87891 Personal history of nicotine dependence: Secondary | ICD-10-CM | POA: Diagnosis not present

## 2024-03-13 DIAGNOSIS — N35919 Unspecified urethral stricture, male, unspecified site: Secondary | ICD-10-CM | POA: Diagnosis not present

## 2024-03-13 DIAGNOSIS — Z7984 Long term (current) use of oral hypoglycemic drugs: Secondary | ICD-10-CM | POA: Diagnosis not present

## 2024-03-13 DIAGNOSIS — Z01818 Encounter for other preprocedural examination: Secondary | ICD-10-CM

## 2024-03-13 DIAGNOSIS — D649 Anemia, unspecified: Secondary | ICD-10-CM | POA: Diagnosis not present

## 2024-03-13 DIAGNOSIS — E119 Type 2 diabetes mellitus without complications: Secondary | ICD-10-CM | POA: Insufficient documentation

## 2024-03-13 DIAGNOSIS — I509 Heart failure, unspecified: Secondary | ICD-10-CM | POA: Diagnosis not present

## 2024-03-13 DIAGNOSIS — R338 Other retention of urine: Secondary | ICD-10-CM | POA: Diagnosis not present

## 2024-03-13 DIAGNOSIS — R351 Nocturia: Secondary | ICD-10-CM | POA: Diagnosis not present

## 2024-03-13 DIAGNOSIS — N35912 Unspecified bulbous urethral stricture, male: Secondary | ICD-10-CM | POA: Diagnosis not present

## 2024-03-13 DIAGNOSIS — I10 Essential (primary) hypertension: Secondary | ICD-10-CM | POA: Diagnosis not present

## 2024-03-13 DIAGNOSIS — N99111 Postprocedural bulbous urethral stricture: Secondary | ICD-10-CM

## 2024-03-13 HISTORY — PX: CYSTOURETHROSCOPY, W/ URETHRAL STRICTURE DILATION USING DRUG-COATED BALLOON: SHX7696

## 2024-03-13 LAB — GLUCOSE, CAPILLARY
Glucose-Capillary: 134 mg/dL — ABNORMAL HIGH (ref 70–99)
Glucose-Capillary: 120 mg/dL — ABNORMAL HIGH (ref 70–99)

## 2024-03-13 SURGERY — CYSTOURETHROSCOPY, WITH URETHRAL STRICTURE DILATION USING DRUG-COATED BALLOON
Anesthesia: General

## 2024-03-13 MED ORDER — STERILE WATER FOR IRRIGATION IR SOLN
Status: DC | PRN
  Administered 2024-03-13: 3000 mL

## 2024-03-13 MED ORDER — ONDANSETRON HCL 4 MG/2ML IJ SOLN
INTRAMUSCULAR | Status: DC | PRN
  Administered 2024-03-13: 4 mg via INTRAVENOUS

## 2024-03-13 MED ORDER — PROPOFOL 10 MG/ML IV BOLUS
INTRAVENOUS | Status: DC | PRN
  Administered 2024-03-13: 130 mg via INTRAVENOUS

## 2024-03-13 MED ORDER — ORAL CARE MOUTH RINSE: 15.0000 mL | Freq: Once | OROMUCOSAL | Status: AC

## 2024-03-13 MED ORDER — FENTANYL CITRATE (PF) 100 MCG/2ML IJ SOLN
INTRAMUSCULAR | Status: AC
  Filled 2024-03-13: qty 2

## 2024-03-13 MED ORDER — MIDAZOLAM HCL 2 MG/2ML IJ SOLN
INTRAMUSCULAR | Status: AC
  Filled 2024-03-13: qty 2

## 2024-03-13 MED ORDER — INSULIN ASPART 100 UNIT/ML IJ SOLN: 0.0000 [IU] | INTRAMUSCULAR | Status: DC | PRN

## 2024-03-13 MED ORDER — PHENYLEPHRINE HCL-NACL 20-0.9 MG/250ML-% IV SOLN
INTRAVENOUS | Status: DC | PRN
  Administered 2024-03-13: 40 ug/min via INTRAVENOUS

## 2024-03-13 MED ORDER — FENTANYL CITRATE (PF) 50 MCG/ML IJ SOSY: 25.0000 ug | PREFILLED_SYRINGE | INTRAMUSCULAR | Status: DC | PRN

## 2024-03-13 MED ORDER — EPHEDRINE SULFATE-NACL 50-0.9 MG/10ML-% IV SOSY
PREFILLED_SYRINGE | INTRAVENOUS | Status: DC | PRN
  Administered 2024-03-13 (×2): 10 mg via INTRAVENOUS

## 2024-03-13 MED ORDER — ACETAMINOPHEN 500 MG PO TABS
1000.0000 mg | ORAL_TABLET | Freq: Once | ORAL | Status: AC
Start: 2024-03-13 — End: 2024-03-13
  Administered 2024-03-13: 1000 mg via ORAL
  Filled 2024-03-13: qty 2

## 2024-03-13 MED ORDER — PROPOFOL 10 MG/ML IV BOLUS
INTRAVENOUS | Status: AC
  Filled 2024-03-13: qty 20

## 2024-03-13 MED ORDER — MIDAZOLAM HCL (PF) 2 MG/2ML IJ SOLN
INTRAMUSCULAR | Status: DC | PRN
  Administered 2024-03-13: 2 mg via INTRAVENOUS

## 2024-03-13 MED ORDER — CHLORHEXIDINE GLUCONATE 0.12 % MT SOLN
15.0000 mL | Freq: Once | OROMUCOSAL | Status: AC
  Administered 2024-03-13: 15 mL via OROMUCOSAL

## 2024-03-13 MED ORDER — CEFAZOLIN SODIUM-DEXTROSE 2-4 GM/100ML-% IV SOLN
2.0000 g | INTRAVENOUS | Status: AC
  Administered 2024-03-13: 2 g via INTRAVENOUS
  Filled 2024-03-13: qty 100

## 2024-03-13 MED ORDER — LACTATED RINGERS IV SOLN: INTRAVENOUS | Status: DC | PRN

## 2024-03-13 MED ORDER — IOHEXOL 300 MG/ML  SOLN
INTRAMUSCULAR | Status: DC | PRN
  Administered 2024-03-13: 50 mL

## 2024-03-13 MED ORDER — SODIUM CHLORIDE 0.9 % IR SOLN
Status: DC | PRN
  Administered 2024-03-13: 1000 mL

## 2024-03-13 MED ORDER — PHENYLEPHRINE 80 MCG/ML (10ML) SYRINGE FOR IV PUSH (FOR BLOOD PRESSURE SUPPORT)
PREFILLED_SYRINGE | INTRAVENOUS | Status: DC | PRN
Start: 2024-03-13 — End: 2024-03-13
  Administered 2024-03-13: 80 ug via INTRAVENOUS
  Administered 2024-03-13 (×3): 160 ug via INTRAVENOUS
  Administered 2024-03-13 (×2): 80 ug via INTRAVENOUS

## 2024-03-13 MED ORDER — LIDOCAINE 2% (20 MG/ML) 5 ML SYRINGE
INTRAMUSCULAR | Status: DC | PRN
  Administered 2024-03-13: 80 mg via INTRAVENOUS

## 2024-03-13 MED ORDER — FENTANYL CITRATE (PF) 100 MCG/2ML IJ SOLN
INTRAMUSCULAR | Status: DC | PRN
  Administered 2024-03-13 (×2): 50 ug via INTRAVENOUS

## 2024-03-13 MED ORDER — DEXAMETHASONE SOD PHOSPHATE PF 10 MG/ML IJ SOLN
INTRAMUSCULAR | Status: DC | PRN
  Administered 2024-03-13: 5 mg via INTRAVENOUS

## 2024-03-13 MED ORDER — LACTATED RINGERS IV SOLN: INTRAVENOUS | Status: DC

## 2024-03-13 SURGICAL SUPPLY — 29 items
BAG URINE DRAIN 2000ML AR STRL (UROLOGICAL SUPPLIES) ×1 IMPLANT
BAG URO CATCHER STRL LF (MISCELLANEOUS) ×1 IMPLANT
BALLOON NEPHROSTOMY (BALLOONS) IMPLANT
BALLOON OPTILUME DCB 30X5X75 (BALLOONS) ×1 IMPLANT
CATH FOLEY 2W COUNCIL 20FR 5CC (CATHETERS) IMPLANT
CATH ROBINSON RED A/P 14FR (CATHETERS) IMPLANT
CATH SET URETHRAL DILATOR (CATHETERS) IMPLANT
CATH URETL OPEN 5X70 (CATHETERS) IMPLANT
CLOTH BEACON ORANGE TIMEOUT ST (SAFETY) ×1 IMPLANT
DEVICE INFLATION ATRION QL4015 (MISCELLANEOUS) IMPLANT
DRAPE FOOT SWITCH (DRAPES) ×1 IMPLANT
ELECT REM PT RETURN 15FT ADLT (MISCELLANEOUS) ×1 IMPLANT
EVACUATOR MICROVAS BLADDER (UROLOGICAL SUPPLIES) IMPLANT
GLOVE BIO SURGEON STRL SZ7.5 (GLOVE) ×1 IMPLANT
GLOVE BIOGEL M 7.0 STRL (GLOVE) ×1 IMPLANT
GOWN STRL REUS W/ TWL XL LVL3 (GOWN DISPOSABLE) ×1 IMPLANT
GOWN STRL SURGICAL XL XLNG (GOWN DISPOSABLE) ×1 IMPLANT
GUIDEWIRE ANG ZIPWIRE 038X150 (WIRE) IMPLANT
GUIDEWIRE STR DUAL SENSOR (WIRE) ×1 IMPLANT
HOLDER FOLEY CATH W/STRAP (MISCELLANEOUS) IMPLANT
KIT TURNOVER KIT A (KITS) ×1 IMPLANT
LOOP CUT BIPOLAR 24F LRG (ELECTROSURGICAL) IMPLANT
MANIFOLD NEPTUNE II (INSTRUMENTS) ×1 IMPLANT
NS IRRIG 1000ML POUR BTL (IV SOLUTION) IMPLANT
PACK CYSTO (CUSTOM PROCEDURE TRAY) ×1 IMPLANT
SYRINGE TOOMEY IRRIG 70ML (MISCELLANEOUS) IMPLANT
TUBING CONNECTING 10 (TUBING) ×1 IMPLANT
TUBING UROLOGY SET (TUBING) ×1 IMPLANT
WATER STERILE IRR 3000ML UROMA (IV SOLUTION) ×1 IMPLANT

## 2024-03-13 NOTE — Discharge Instructions (Signed)
 Activity:  You are encouraged to ambulate frequently (about every hour during waking hours) to help prevent blood clots from forming in your legs or lungs.   ? ?Diet: You should advance your diet as instructed by your physician.  It will be normal to have some bloating, nausea, and abdominal discomfort intermittently. ? ?Prescriptions:  You will be provided a prescription for pain medication to take as needed.  If your pain is not severe enough to require the prescription pain medication, you may take extra strength Tylenol instead which will have less side effects.  You should also take a prescribed stool softener to avoid straining with bowel movements as the prescription pain medication may constipate you. ? ?What to call us about: You should call the office [phone]) if you develop fever > 101 or develop persistent vomiting. Activity:  You are encouraged to ambulate frequently (about every hour during waking hours) to help prevent blood clots from forming in your legs or lungs.   ? ?You have a foley catheter in place. Return to clinic in 3 days for foley removal. ? ?

## 2024-03-13 NOTE — Op Note (Signed)
 Operative Note  Preoperative diagnosis:  1.  Bulbar urethral stricture  Postoperative diagnosis: 1.  Bulbar urethral stricture  Procedure(s): 1.  Cystoscopy 2. Retrograde pyelogram 3. Optilume balloon dilation of bulbar urethral stricture  Surgeon: [name], MD  Assistants:  None  Anesthesia:  General  Complications:  None  EBL:  Minimal  Specimens: 1. None  Drains/Catheters: 1.  20 [place] council catheter  Intraoperative findings:   Approximately 2cm in length bulbar urethral stricture, wide open prostate with previous TURP defect with no obstruction from prostate and no suspicious bladder lesions.  Indication:  Tyler Garner is a 68 y.o. male with a bulbar urethral stricture here for urethral dilation.  Description of procedure: After informed consent was obtained from the patient, the patient was identified and taken to the operating room and placed in the supine position.  General anesthesia was administered as well as perioperative IV antibiotics.  At the beginning of the case, a time-out was performed to properly identify the patient, the surgery to be performed, and the surgical site.  Sequential compression devices were applied to the lower extremities at the beginning of the case for DVT prophylaxis.  The patient was then placed in the dorsal lithotomy supine position, prepped and draped in sterile fashion.  I then passed the 21-French rigid cystoscope through the urethra up the the leve of the stricture. I advanced a 0.038 sensor wire across the stricture and into the bladder. I then removed the scope leaving the wire behind. I then dilated the stricture using cook urethral dilators from 14 french to 24 french. I then performed cystoscopy noting wide open prostate with previous TURP defect and no suspicious bladder lesions. I then replaced the scope alongside the wire and passed the Optilume balloon dilator kit over the wire and across the stricture under visual  guidance. I made sure there was at least 1/2 cm of balloon on each side of the stricture. I allowed this to equilibrate in place for one minute. I chose a 4Fr 3cm balloon. I then inflated the balloon to 10 ATM and left it inflated for 5 minutes. I then deflated the balloon. The stricture was no longer tight and the bulbar urethra was 4F. I did not bypass the stricture with the scope. Recognizing a widely patent urethra I remove the scope and placed a 20 French Foley catheter.  The urine was noted to efflux nice and clear.  The patient was subsequently awoken and returned to PACU stable condition.  Plan:  Void trial in office as scheduled, in around 72 hours.  Matt R. Kelliann Pendergraph MD [facility]  Pager: [phone]

## 2024-03-13 NOTE — Anesthesia Procedure Notes (Signed)
 Procedure Name: LMA Insertion Date/Time: 03/13/2024 2:49 PM  Performed by: Buster Catheryn SAUNDERS, CRNAPre-anesthesia Checklist: Patient identified, Emergency Drugs available, Suction available and Patient being monitored Patient Re-evaluated:Patient Re-evaluated prior to induction Oxygen Delivery Method: Circle system utilized Preoxygenation: Pre-oxygenation with 100% oxygen Induction Type: IV induction Ventilation: Mask ventilation without difficulty LMA: LMA inserted LMA Size: 5.0 Number of attempts: 1 Airway Equipment and Method: Bite block Tube secured with: Tape Dental Injury: Teeth and Oropharynx as per pre-operative assessment

## 2024-03-13 NOTE — Transfer of Care (Signed)
 Immediate Anesthesia Transfer of Care Note  Patient: Tyler Garner  Procedure(s) Performed: CYSTOURETHROSCOPY, WITH URETHRAL STRICTURE DILATION USING DRUG-COATED BALLOON  Patient Location: PACU  Anesthesia Type:General  Level of Consciousness: awake, alert , and oriented  Airway & Oxygen Therapy: Patient Spontanous Breathing and Patient connected to face mask oxygen  Post-op Assessment: Report given to RN and Post -op Vital signs reviewed and stable  Post vital signs: Reviewed and stable  Last Vitals:  Vitals Value Taken Time  BP 154/81 03/13/24 15:31  Temp    Pulse 80 03/13/24 15:34  Resp 12 03/13/24 15:34  SpO2 100 % 03/13/24 15:34  Vitals shown include unfiled device data.  Last Pain:  Vitals:   03/13/24 1339  TempSrc:   PainSc: 0-No pain         Complications: No notable events documented.

## 2024-03-13 NOTE — H&P (Signed)
 Office Visit Report     02/09/2024   --------------------------------------------------------------------------------   Tyler Garner  MRN: [id number]  DOB: [birth year], 68 year old Male  SSN:    PRIMARY CARE:  Ronal EMERSON Lunger, NP  PRIMARY CARE FAX:  [phone]  REFERRING:  [name], MD  PROVIDER:  [name], M.D.  LOCATION:  [facility] [phone]     --------------------------------------------------------------------------------   CC/HPI: Tyler Garner is a 68 year old male seen in follow-up with BPH and gross hematuria.   1. BPH/bulbar urethral stricture  He was seen as a hospital follow-up with urinary retention. He underwent lumbar decompression on 11/28/2020 and postoperatively developed urinary retention along with postoperative ileus. He failed a void trial on 12/02/2020. Prior to this surgery, he reported that he had a strong flow of stream and denies sensation incomplete bladder emptying. He did have 2 time nocturia. He denies taking alpha blockers prior to this episode. He does have a history of lower back pain radiating down his right leg and had some difficulty ambulating over the past several years. He has been on tamsulosin  0.4 mg over the past 6 weeks.  Urodynamics 02/05/2021: Maximum capacity 360 mL, delayed for sensation at 269 mL, unable to generate a voluntary contraction. His average pressure was 14.4 cm water.  BPH evaluation 02/17/2021: TRUS volume 59cm^3. Cystoscopy with bilobar obstructing prostate.  - S/p TURP 03/2021 with 11 g BPH.  -He reported weaker flow stream in 2025. Cystoscopy 02/09/2024 reveals bulbar urethral stricture   2. Gross hematuria: He had intermittent gross hematuria in 11/2023. CT hematuria protocol 12/2023 with nonobstructing right renal stones however no suspicious lesions cystoscopy 02/09/2024 with bulbar stricture about 12 [place] and impassable with scope. He denies family history of urologic malignancy. He has a  significant smoking history smoking for 45 years.   #3. Prostate cancer screening: No family history of prostate cancer. Most recent PSA 0.4 in 4/25.   He has a past medical history of CHF, DM2, HTN, HLD, history of inferior STEMI s/p DES in 2015, lumbar DDD s/p lumbar decompression. He would need medical clearance prior to procedure.   Of note, he has a grandfather of [place] (CMA) child.   Patient currently denies fever, chills, sweats, nausea, vomiting, abdominal or flank pain, gross hematuria or dysuria.     ALLERGIES: Percocet    MEDICATIONS: Lisinopril  20 MG Tablet  Metoprolol  Tartrate 25 MG Tablet  Tamsulosin  HCl 0.4 MG Capsule 1 capsule PO Q HS  Tamsulosin  HCl 0.4 MG Capsule  amLODIPine  Besylate 5 MG Tablet  Ferrous Sulfate  325 (65 Fe) MG Tablet  Glimepiride  4 MG Tablet  Insulin  Glargine 100 UNIT/ML Solution  Janumet  50-1000 MG Tablet  levoFLOXacin  750 MG Tablet  oxyCODONE  HCl 5 MG Capsule  Polyethylene Glycol  Rosuvastatin  Calcium  20 MG Tablet  Senna-Time 8.6 MG Tablet     GU PSH: Complex cystometrogram, w/ void pressure and urethral pressure profile studies, any technique - 2022 Complex Uroflow - 2022 Cystoscopy - 2022 Emg surf Electrd - 2022 Inject For cystogram - 2022 Intrabd voidng Press - 2022 Locm 300-399Mg /Ml Iodine,1Ml - 01/07/2024       PSH Notes: Back Surgery - 11/23/20   NON-GU PSH: Cardiac Stent Placement Visit Complexity (formerly GPC1X) - 12/27/2023     GU PMH: Gross hematuria - 01/07/2024, - 12/27/2023 Urinary Retention - 01/07/2024, - 2022, - 2022, - 2022, - 2022, - 2022 BPH w/LUTS - 12/27/2023, - 2022, - 2022, - 2022 Encounter for Prostate  Cancer screening (Stable) - 12/27/2023, - 2022, - 2022 Weak Urinary Stream - 12/27/2023    NON-GU PMH: Arthritis Diabetes Type 2 Hypercholesterolemia Hypertension Myocardial Infarction Sleep Apnea    FAMILY HISTORY: 3 Son's - Runs in Family Diabetes - Runs in Family High Blood Pressure - Runs in Family    SOCIAL HISTORY: Marital Status: Widowed Ethnicity: Not Hispanic Or Latino; Race: White Current Smoking Status: Patient does not smoke anymore. Has not smoked since 12/23/2009. Smoked for 45 years.   Tobacco Use Assessment Completed: Used Tobacco in last 30 days? Does not drink anymore.  Does not drink caffeine .    REVIEW OF SYSTEMS:    GU Review Male:   Patient denies frequent urination, hard to postpone urination, burning/ pain with urination, get up at night to urinate, leakage of urine, stream starts and stops, trouble starting your stream, have to strain to urinate , erection problems, and penile pain.  Gastrointestinal (Upper):   Patient denies nausea, vomiting, and indigestion/ heartburn.  Gastrointestinal (Lower):   Patient denies diarrhea and constipation.  Constitutional:   Patient denies fever, night sweats, weight loss, and fatigue.  Skin:   Patient denies skin rash/ lesion and itching.  Eyes:   Patient denies blurred vision and double vision.  Ears/ Nose/ Throat:   Patient denies sore throat and sinus problems.  Hematologic/Lymphatic:   Patient denies swollen glands and easy bruising.  Cardiovascular:   Patient denies leg swelling and chest pains.  Respiratory:   Patient denies shortness of breath and cough.  Endocrine:   Patient denies excessive thirst.  Musculoskeletal:   Patient denies back pain and joint pain.  Neurological:   Patient denies headaches and dizziness.  Psychologic:   Patient denies depression and anxiety.   VITAL SIGNS: None   MULTI-SYSTEM PHYSICAL EXAMINATION:    Constitutional: Well-nourished. No physical deformities. Normally developed. Good grooming.  Respiratory: No labored breathing, no use of accessory muscles.   Cardiovascular: Normal temperature, normal extremity pulses, no swelling, no varicosities.  Gastrointestinal: No mass, no tenderness, no rigidity, non obese abdomen.     Complexity of Data:  Source Of History:  Patient, Medical  Record Summary  Records Review:   Previous Hospital Records, Previous Patient Records  Urine Test Review:   Urinalysis  X-Ray Review: C.T. Hematuria: Reviewed Films. Reviewed Report. Discussed With Patient.     12/27/20  PSA  Total PSA 1.01 ng/mL    PROCEDURES:         Flexible Cystoscopy - 52000  Risks, benefits, and some of the potential complications of the procedure were discussed at length with the patient including infection, bleeding, voiding discomfort, urinary retention, fever, chills, sepsis, and others. All questions were answered. Informed consent was obtained. Antibiotic prophylaxis was given. Sterile technique and intraurethral analgesia were used.  Meatus:  Normal size. Normal location. Normal condition.  Urethra:  Approximately the 12 French bulbar urethral stricture, unable to bypassed the scope.      The lower urinary tract was carefully examined. The procedure was well-tolerated and without complications. Antibiotic instructions were given. Instructions were given to call the office immediately for bloody urine, difficulty urinating, urinary retention, painful or frequent urination, fever, chills, nausea, vomiting or other illness. The patient stated that he understood these instructions and would comply with them.         Visit Complexity - G2211          Urinalysis - 81003 Dipstick Dipstick Cont'd  Color: Yellow Bilirubin: Neg  Appearance: Clear  Ketones: Trace  Specific Gravity: 1.020 Blood: Neg  pH: 5.5 Protein: Trace  Glucose: Neg Urobilinogen: 0.2    Nitrites: Neg    Leukocyte Esterase: Neg    Notes:      ASSESSMENT:      ICD-10 Details  1 GU:   Gross hematuria - R31.0   2   BPH w/LUTS - N40.1   3   Weak Urinary Stream - R39.12   4   Encounter for Prostate Cancer screening - Z12.5   5   Bulbar urethral stricture - N35.011    PLAN:           Document Letter(s):  Created for Patient: Clinical Summary         Notes:    #1. BPH:  -S/p TURP in  03/2021   #2. Bulbar urethral stricture: Cystoscopy 9-17/25 with 12 French urethral stricture. Discussed options he elected proceed with Optilume balloon dilation. Surgery letter submitted.   3 gross hematuria: Negative valuation other than blood [place] for stricture. Will need to undergo cystoscopy in the operating room after dilation to confirm no bladder lesion.   #3. Prostate cancer screening: Up-to-date prostate cancer screening. Recommend annual PSA.   CC: Ronal Rollene Lunger, MD     Urology Preoperative H&P    History of Present Illness: JJ ENYEART is a 68 y.o. male with Bulbar urethral stricture here for Optilume balloon dilation. Denies fevers, chills, dysuria.     Past Medical History:  Diagnosis Date   Arthritis    hands and hips oa   CAD (coronary artery disease)    a. inferior STEMI (05/2013) with RV involvement- LHC:  dist CFX occluded (Promus premier (2.5x24 mm) DES); EF 40-45% with inf HK;  b. Echo (report pending) with EF 45-50%, lat WMA, RVE, mod RV hypokinesis    Chronic kidney disease    Complication of anesthesia 11/28/2020   confusion x 1 hour after 11-28-2020 back surgery   Foley catheter in place    since 11-28-2020 foley last changed 1 week ago as of 03-25-2021   History of kidney stones    Hyperlipidemia    Hypertension    Ischemic cardiomyopathy    Myocardial infarction (HCC) 2015   Nicotine addiction    Renal colic    Right shoulder injury 10/11/2009   Type 2 DM    Urinary retention    since july 2022    Past Surgical History:  Procedure Laterality Date   heart stent  06/21/2013   x 1 to distal LCX   LEFT HEART CATHETERIZATION WITH CORONARY ANGIOGRAM N/A 06/21/2013   Procedure: LEFT HEART CATHETERIZATION WITH CORONARY ANGIOGRAM;  Surgeon: Peter M Swazil[place], MD;  Location[facility] C[facility] LAB;  Service: Cardiovascular;  Laterality: N/A;   LUMBAR LAMINECTOMY/DECOMPRESSION MICRODISCECTOMY N/A 11/28/2020   Procedure: LUMBAR DECOMPRESSION  2 LEVELS, L3  and L5;  Surgeon: Burnetta Aures, MD;  Location: [facility] OR;  Service: Orthopedics;  Laterality: N/A;  3.5 hrs   TRANSURETHRAL RESECTION OF PROSTATE N/A 03/28/2021   Procedure: TRANSURETHRAL RESECTION OF THE PROSTATE (TURP)/ BIPOLAR;  Surgeon: Selma Donnice JONELLE, MD;  Location: [facility];  Service: Urology;  Laterality: N/A;    Allergies:  Allergies  Allergen Reactions   Percocet [Oxycodone -Acetaminophen ] Nausea And Vomiting    Family History  Problem Relation Age of Onset   Diabetes Mother    ALS Mother    Alzheimer's disease Father    Healthy Sister    Diabetes Brother  Social History:  reports that he quit smoking about 13 years ago. His smoking use included cigarettes. He started smoking about 53 years ago. He has a 40 pack-year smoking history. He quit smokeless tobacco use about 15 years ago. He reports that he does not currently use alcohol. He reports that he does not use drugs.  ROS: A complete review of systems was performed.  All systems are negative except for pertinent findings as noted.  Physical Exam:  Vital signs in last 24 hours:   Constitutional:  Alert and oriented, No acute distress Cardiovascular: Regular rate and rhythm Respiratory: Normal respiratory effort, Lungs clear bilaterally GI: Abdomen is soft, nontender, nondistended, no abdominal masses GU: No CVA tenderness Lymphatic: No lymphadenopathy Neurologic: Grossly intact, no focal deficits Psychiatric: Normal mood and affect  Laboratory Data:  No results for input(s): WBC, HGB, HCT, PLT in the last 72 hours.  No results for input(s): NA, K, CL, GLUCOSE, BUN, CALCIUM , CREATININE in the last 72 hours.  Invalid input(s): CO3   No results found for this or any previous visit (from the past 24 hours). No results found for this or any previous visit (from the past 240 hours).  Renal Function: Recent Labs    03/07/24 0936  CREATININE 0.87   Estimated Creatinine  Clearance: 92.8 mL/min (by C-G formula based on SCr of 0.87 mg/dL).  Radiologic Imaging: No results found.  I independently reviewed the above imaging studies.  Assessment and Plan AMELIO BROSKY is a 68 y.o. male with Bulbar urethral stricture here for Optilume balloon dilation.  Matt R. Hildagarde Holleran MD 03/13/2024, 10:43 AM  [facility] Pager: [phone]

## 2024-03-13 NOTE — Anesthesia Postprocedure Evaluation (Signed)
 Anesthesia Post Note  Patient: Tyler Garner  Procedure(s) Performed: CYSTOURETHROSCOPY, WITH URETHRAL STRICTURE DILATION USING DRUG-COATED BALLOON     Patient location during evaluation: PACU Anesthesia Type: General Level of consciousness: awake and alert Pain management: pain level controlled Vital Signs Assessment: post-procedure vital signs reviewed and stable Respiratory status: spontaneous breathing, nonlabored ventilation and respiratory function stable Cardiovascular status: blood pressure returned to baseline and stable Postop Assessment: no apparent nausea or vomiting Anesthetic complications: no   No notable events documented.  Last Vitals:  Vitals:   03/13/24 1600 03/13/24 1613  BP: 137/79 (!) 158/77  Pulse: 75 73  Resp: 19   Temp: (!) 36.3 C (!) 36.3 C  SpO2: 98% 95%    Last Pain:  Vitals:   03/13/24 1613  TempSrc:   PainSc: 0-No pain                 Nadeem Romanoski,W. EDMOND

## 2024-03-14 ENCOUNTER — Encounter (HOSPITAL_COMMUNITY): Payer: Self-pay | Admitting: Urology

## 2024-03-15 NOTE — Telephone Encounter (Signed)
 Cinderella Deatrice FALCON, MD  [place], Laymon HERO, PA-C; [place], Autumn S, [place] Thanks [place] !  Autumn we can schedule now       Previous Messages    ----- Message ----- From: Johnson Laymon HERO DEVONNA Sent: 03/13/2024   6:41 PM EDT To: Lionel GORMAN Pa, CMA; Deatrice FALCON Cinderella, MD  Yes, cleared to proceed for EGD/colonoscopy. Sorry about the delay. I think I forwarded everything for his Urology procedure as it was scheduled for this week but forgot to send everything to you as well!  Best, [place] ----- Message ----- From: Cinderella Deatrice FALCON, MD Sent: 03/13/2024   4:04 PM EDT To: Laymon HERO Johnson, PA-C; Autumn S [place], [place]  Thanks , I see patient Echo was done , just had some calficification , can we schedule Egd/Colon now ?

## 2024-03-16 ENCOUNTER — Telehealth (INDEPENDENT_AMBULATORY_CARE_PROVIDER_SITE_OTHER): Payer: Self-pay

## 2024-03-16 DIAGNOSIS — R338 Other retention of urine: Secondary | ICD-10-CM | POA: Diagnosis not present

## 2024-03-16 MED ORDER — PEG 3350-KCL-NA BICARB-NACL 420 G PO SOLR: 4000.0000 mL | Freq: Once | ORAL | 0 refills | Status: AC

## 2024-03-16 NOTE — Telephone Encounter (Signed)
 ATC pt, phone rang several times and said call could not be completed at this time and hung up.

## 2024-03-16 NOTE — Addendum Note (Signed)
 Addended by: DALLIE LIONEL RAMAN on: 03/16/2024 12:45 PM   Modules accepted: Orders

## 2024-03-16 NOTE — Telephone Encounter (Signed)
 PA on [facility] for EGD/TCS: Notification or Prior Authorization is not required for the requested services You are not required to submit a notification/prior authorization based on the information provided. Decision ID #: [id number]

## 2024-03-16 NOTE — Telephone Encounter (Signed)
 Spoke with patient, scheduled TCS/EGD for 04/06/2024 at 7:30am. Rx sent to pharmacy. Instructions mailed.

## 2024-04-03 ENCOUNTER — Encounter (HOSPITAL_COMMUNITY)
Admission: RE | Admit: 2024-04-03 | Discharge: 2024-04-03 | Disposition: A | Discharge status: Home / Self Care | Source: Ambulatory Visit | Attending: Gastroenterology | Admitting: Gastroenterology

## 2024-04-06 ENCOUNTER — Ambulatory Visit (HOSPITAL_COMMUNITY)
Admission: RE | Admit: 2024-04-06 | Discharge: 2024-04-06 | Disposition: A | Discharge status: Home / Self Care | Attending: Gastroenterology | Admitting: Gastroenterology

## 2024-04-06 ENCOUNTER — Encounter (HOSPITAL_COMMUNITY)
Admission: RE | Disposition: A | Discharge status: Home / Self Care | Payer: Self-pay | Source: Home / Self Care | Attending: Gastroenterology

## 2024-04-06 ENCOUNTER — Ambulatory Visit (HOSPITAL_COMMUNITY): Admitting: Anesthesiology

## 2024-04-06 ENCOUNTER — Encounter (HOSPITAL_COMMUNITY): Payer: Self-pay | Admitting: Gastroenterology

## 2024-04-06 DIAGNOSIS — N400 Enlarged prostate without lower urinary tract symptoms: Secondary | ICD-10-CM | POA: Diagnosis not present

## 2024-04-06 DIAGNOSIS — E1122 Type 2 diabetes mellitus with diabetic chronic kidney disease: Secondary | ICD-10-CM | POA: Diagnosis not present

## 2024-04-06 DIAGNOSIS — I2510 Atherosclerotic heart disease of native coronary artery without angina pectoris: Secondary | ICD-10-CM | POA: Diagnosis not present

## 2024-04-06 DIAGNOSIS — K648 Other hemorrhoids: Secondary | ICD-10-CM | POA: Insufficient documentation

## 2024-04-06 DIAGNOSIS — I110 Hypertensive heart disease with heart failure: Secondary | ICD-10-CM | POA: Diagnosis not present

## 2024-04-06 DIAGNOSIS — Z87891 Personal history of nicotine dependence: Secondary | ICD-10-CM | POA: Diagnosis not present

## 2024-04-06 DIAGNOSIS — K2950 Unspecified chronic gastritis without bleeding: Secondary | ICD-10-CM | POA: Insufficient documentation

## 2024-04-06 DIAGNOSIS — D122 Benign neoplasm of ascending colon: Secondary | ICD-10-CM | POA: Insufficient documentation

## 2024-04-06 DIAGNOSIS — K449 Diaphragmatic hernia without obstruction or gangrene: Secondary | ICD-10-CM | POA: Insufficient documentation

## 2024-04-06 DIAGNOSIS — Z794 Long term (current) use of insulin: Secondary | ICD-10-CM | POA: Insufficient documentation

## 2024-04-06 DIAGNOSIS — E785 Hyperlipidemia, unspecified: Secondary | ICD-10-CM | POA: Insufficient documentation

## 2024-04-06 DIAGNOSIS — D123 Benign neoplasm of transverse colon: Secondary | ICD-10-CM | POA: Insufficient documentation

## 2024-04-06 DIAGNOSIS — I5022 Chronic systolic (congestive) heart failure: Secondary | ICD-10-CM

## 2024-04-06 DIAGNOSIS — I509 Heart failure, unspecified: Secondary | ICD-10-CM | POA: Diagnosis not present

## 2024-04-06 DIAGNOSIS — D128 Benign neoplasm of rectum: Secondary | ICD-10-CM | POA: Diagnosis not present

## 2024-04-06 DIAGNOSIS — Z7984 Long term (current) use of oral hypoglycemic drugs: Secondary | ICD-10-CM | POA: Diagnosis not present

## 2024-04-06 DIAGNOSIS — R634 Abnormal weight loss: Secondary | ICD-10-CM | POA: Diagnosis not present

## 2024-04-06 DIAGNOSIS — I130 Hypertensive heart and chronic kidney disease with heart failure and stage 1 through stage 4 chronic kidney disease, or unspecified chronic kidney disease: Secondary | ICD-10-CM | POA: Insufficient documentation

## 2024-04-06 DIAGNOSIS — N189 Chronic kidney disease, unspecified: Secondary | ICD-10-CM | POA: Diagnosis not present

## 2024-04-06 DIAGNOSIS — Z7982 Long term (current) use of aspirin: Secondary | ICD-10-CM | POA: Insufficient documentation

## 2024-04-06 DIAGNOSIS — Z9079 Acquired absence of other genital organ(s): Secondary | ICD-10-CM | POA: Diagnosis not present

## 2024-04-06 DIAGNOSIS — D509 Iron deficiency anemia, unspecified: Secondary | ICD-10-CM | POA: Diagnosis present

## 2024-04-06 DIAGNOSIS — I252 Old myocardial infarction: Secondary | ICD-10-CM | POA: Diagnosis not present

## 2024-04-06 DIAGNOSIS — Z791 Long term (current) use of non-steroidal anti-inflammatories (NSAID): Secondary | ICD-10-CM | POA: Insufficient documentation

## 2024-04-06 DIAGNOSIS — K2970 Gastritis, unspecified, without bleeding: Secondary | ICD-10-CM

## 2024-04-06 HISTORY — PX: COLONOSCOPY: SHX5424

## 2024-04-06 HISTORY — PX: ESOPHAGOGASTRODUODENOSCOPY: SHX5428

## 2024-04-06 LAB — GLUCOSE, CAPILLARY: Glucose-Capillary: 152 mg/dL — ABNORMAL HIGH (ref 70–99)

## 2024-04-06 SURGERY — COLONOSCOPY
Anesthesia: Monitor Anesthesia Care

## 2024-04-06 MED ORDER — LIDOCAINE 2% (20 MG/ML) 5 ML SYRINGE
INTRAMUSCULAR | Status: DC | PRN
  Administered 2024-04-06: 100 mg via INTRAVENOUS

## 2024-04-06 MED ORDER — EPHEDRINE SULFATE (PRESSORS) 25 MG/5ML IV SOSY
PREFILLED_SYRINGE | INTRAVENOUS | Status: DC | PRN
  Administered 2024-04-06 (×5): 5 mg via INTRAVENOUS

## 2024-04-06 MED ORDER — LACTATED RINGERS IV SOLN: INTRAVENOUS | Status: DC

## 2024-04-06 MED ORDER — PROPOFOL 500 MG/50ML IV EMUL
INTRAVENOUS | Status: DC | PRN
  Administered 2024-04-06: 200 ug/kg/min via INTRAVENOUS

## 2024-04-06 NOTE — Anesthesia Preprocedure Evaluation (Signed)
 Anesthesia Evaluation  Patient identified by MRN, date of birth, ID band Patient awake    Reviewed: Allergy & Precautions, H&P , NPO status , Patient's Chart, lab work & pertinent test results, reviewed documented beta blocker date and time   History of Anesthesia Complications (+) history of anesthetic complications  Airway Mallampati: II  TM Distance: >3 FB Neck ROM: full    Dental no notable dental hx.    Pulmonary neg pulmonary ROS, former smoker   Pulmonary exam normal breath sounds clear to auscultation       Cardiovascular Exercise Tolerance: Good hypertension, + CAD, + Past MI and +CHF   Rhythm:regular Rate:Normal     Neuro/Psych  PSYCHIATRIC DISORDERS      negative neurological ROS     GI/Hepatic negative GI ROS, Neg liver ROS,,,  Endo/Other  diabetes    Renal/GU Renal disease  negative genitourinary   Musculoskeletal   Abdominal   Peds  Hematology  (+) Blood dyscrasia, anemia   Anesthesia Other Findings   Reproductive/Obstetrics negative OB ROS                              Anesthesia Physical Anesthesia Plan  ASA: 3  Anesthesia Plan: MAC   Post-op Pain Management:    Induction:   PONV Risk Score and Plan: Propofol  infusion  Airway Management Planned:   Additional Equipment:   Intra-op Plan:   Post-operative Plan:   Informed Consent: I have reviewed the patients History and Physical, chart, labs and discussed the procedure including the risks, benefits and alternatives for the proposed anesthesia with the patient or authorized representative who has indicated his/her understanding and acceptance.     Dental Advisory Given  Plan Discussed with: CRNA  Anesthesia Plan Comments:         Anesthesia Quick Evaluation

## 2024-04-06 NOTE — Op Note (Signed)
 [facility] Patient Name: Tyler Garner Procedure Date: 04/06/2024 7:10 AM MRN: [id number] Date of Birth: 1956/01/23 Attending MD: Deatrice Dine , MD, [id number] CSN: [id number] Age: 68 Admit Type: Outpatient Procedure:                Colonoscopy Indications:              Unexplained iron deficiency anemia Providers:                Deatrice Dine, MD, Leandrew Edelman RN, RN, Daphne Mulch                            Technician, Technician Referring MD:              Medicines:                Monitored Anesthesia Care Complications:            No immediate complications. Estimated Blood Loss:     Estimated blood loss: none. Estimated blood loss                            was minimal. Procedure:                Pre-Anesthesia Assessment:                           - Prior to the procedure, a History and Physical                            was performed, and patient medications and                            allergies were reviewed. The patient's tolerance of                            previous anesthesia was also reviewed. The risks                            and benefits of the procedure and the sedation                            options and risks were discussed with the patient.                            All questions were answered, and informed consent                            was obtained. Prior Anticoagulants: The patient has                            taken no anticoagulant or antiplatelet agents                            except for aspirin . ASA Grade Assessment: II - A  patient with mild systemic disease. After reviewing                            the risks and benefits, the patient was deemed in                            satisfactory condition to undergo the procedure.                           After obtaining informed consent, the colonoscope                            was passed under direct vision. Throughout the                             procedure, the patient's blood pressure, pulse, and                            oxygen saturations were monitored continuously. The                            [id number]-HQ190L ([id number]) Colon was introduced through                            the anus and advanced to the the cecum, identified                            by appendiceal orifice and ileocecal valve. The                            colonoscopy was performed without difficulty. The                            patient tolerated the procedure well. The quality                            of the bowel preparation was evaluated using the                            BBPS [facility] Bowel Preparation Scale) with scores                            of: Right Colon = 3, Transverse Colon = 3 and Left                            Colon = 3 (entire mucosa seen well with no residual                            staining, small fragments of stool or opaque                            liquid). The total BBPS score equals 9. The  ileocecal valve, appendiceal orifice, and rectum                            were photographed. Scope In: 7:51:49 AM Scope Out: 8:27:28 AM Scope Withdrawal Time: 0 hours 32 minutes 22 seconds  Total Procedure Duration: 0 hours 35 minutes 39 seconds  Findings:      The perianal and digital rectal examinations were normal.      Four sessile polyps were found in the ascending colon. The polyps were 4       to 12 mm in size. These polyps were removed with a cold snare. Resection       and retrieval were complete.      Six sessile polyps were found in the rectum, sigmoid colon, transverse       colon and hepatic flexure. The polyps were 3 to 6 mm in size. These       polyps were removed with a cold snare. Resection and retrieval were       complete.      Non-bleeding internal hemorrhoids were found during retroflexion. The       hemorrhoids were small. Impression:               - Four 4 to 12 mm polyps in the  ascending colon,                            removed with a cold snare. Resected and retrieved.                           - Six 3 to 6 mm polyps in the rectum, in the                            sigmoid colon, in the transverse colon and at the                            hepatic flexure, removed with a cold snare.                            Resected and retrieved.                           - Non-bleeding internal hemorrhoids. Moderate Sedation:      Per Anesthesia Care Recommendation:           - Patient has a contact number available for                            emergencies. The signs and symptoms of potential                            delayed complications were discussed with the                            patient. Return to normal activities tomorrow.                            Written discharge instructions were provided to the  patient.                           - Resume previous diet.                           - Continue present medications.                           - Await pathology results.                           - Repeat colonoscopy in 1 year for surveillance                            based on pathology results; given 10 polyps removed                            - Return to GI clinic as previously scheduled. Procedure Code(s):        --- Professional ---                           [phone], Colonoscopy, flexible; with removal of                            tumor(s), polyp(s), or other lesion(s) by snare                            technique Diagnosis Code(s):        --- Professional ---                           D12.2, Benign neoplasm of ascending colon                           D12.8, Benign neoplasm of rectum                           D12.5, Benign neoplasm of sigmoid colon                           D12.3, Benign neoplasm of transverse colon (hepatic                            flexure or splenic flexure)                           K64.8, Other  hemorrhoids                           D50.9, Iron deficiency anemia, unspecified CPT copyright 2022 American Medical Association. All rights reserved. The codes documented in this report are preliminary and upon coder review may  be revised to meet current compliance requirements. Deatrice Dine, MD Deatrice Dine, MD 04/06/2024 8:32:17 AM This report has been signed electronically. Number of Addenda: 0

## 2024-04-06 NOTE — Anesthesia Postprocedure Evaluation (Signed)
 Anesthesia Post Note  Patient: VLADIMIR LENHOFF  Procedure(s) Performed: COLONOSCOPY EGD (ESOPHAGOGASTRODUODENOSCOPY)  Patient location during evaluation: Phase II Anesthesia Type: MAC Level of consciousness: awake Pain management: pain level controlled Vital Signs Assessment: post-procedure vital signs reviewed and stable Respiratory status: spontaneous breathing and respiratory function stable Cardiovascular status: blood pressure returned to baseline and stable Postop Assessment: no headache and no apparent nausea or vomiting Anesthetic complications: no Comments: Late entry   No notable events documented.   Last Vitals:  Vitals:   04/06/24 0652 04/06/24 0833  BP: (!) 157/85 (!) 115/59  Pulse:  70  Resp: 16 18  Temp: 36.6 C 36.5 C  SpO2: 98% 97%    Last Pain:  Vitals:   04/06/24 0833  TempSrc: Oral  PainSc: 0-No pain                 Yvonna JINNY Bosworth

## 2024-04-06 NOTE — Op Note (Signed)
 [facility] Patient Name: Tyler Garner Procedure Date: 04/06/2024 7:11 AM MRN: [id number] Date of Birth: 1955/10/04 Attending MD: Deatrice Dine , MD, [id number] CSN: [id number] Age: 68 Admit Type: Outpatient Procedure:                Upper GI endoscopy Indications:              Unexplained iron deficiency anemia Providers:                Deatrice Dine, MD, Leandrew Edelman RN, RN, Daphne Mulch                            Technician, Technician Referring MD:              Medicines:                Monitored Anesthesia Care Complications:            No immediate complications. Estimated Blood Loss:     Estimated blood loss was minimal. Procedure:                Pre-Anesthesia Assessment:                           - Prior to the procedure, a History and Physical                            was performed, and patient medications and                            allergies were reviewed. The patient's tolerance of                            previous anesthesia was also reviewed. The risks                            and benefits of the procedure and the sedation                            options and risks were discussed with the patient.                            All questions were answered, and informed consent                            was obtained. Prior Anticoagulants: The patient has                            taken no anticoagulant or antiplatelet agents                            except for aspirin . ASA Grade Assessment: III - A                            patient with severe systemic disease. After  reviewing the risks and benefits, the patient was                            deemed in satisfactory condition to undergo the                            procedure.                           After obtaining informed consent, the endoscope was                            passed under direct vision. Throughout the                            procedure, the patient's  blood pressure, pulse, and                            oxygen saturations were monitored continuously. The                            [id number] ([id number]) Upper was introduced through                            the mouth, and advanced to the second part of                            duodenum. The upper GI endoscopy was accomplished                            without difficulty. The patient tolerated the                            procedure well. Scope In: 7:40:31 AM Scope Out: 7:45:55 AM Total Procedure Duration: 0 hours 5 minutes 24 seconds  Findings:      The examined esophagus was normal.      A 2 cm hiatal hernia was present.      The gastroesophageal flap valve was visualized endoscopically and       classified as Hill Grade III (minimal fold, loose to endoscope, hiatal       hernia likely).      Mild inflammation characterized by erythema was found in the entire       examined stomach. Biopsies were taken with a cold forceps for histology.      Mildly erythematous mucosa without active bleeding and with no stigmata       of bleeding was found in the duodenal bulb and in the second portion of       the duodenum. Biopsies were taken with a cold forceps for histology. Impression:               - Normal esophagus.                           - 2 cm hiatal hernia.                           -  Gastroesophageal flap valve classified as Hill                            Grade III (minimal fold, loose to endoscope, hiatal                            hernia likely).                           - Gastritis. Biopsied.                           - Erythematous duodenopathy. Biopsied. Moderate Sedation:      Per Anesthesia Care Recommendation:           - Patient has a contact number available for                            emergencies. The signs and symptoms of potential                            delayed complications were discussed with the                            patient. Return to normal  activities tomorrow.                            Written discharge instructions were provided to the                            patient.                           - Resume previous diet.                           - Continue present medications.                           - Await pathology results. Procedure Code(s):        --- Professional ---                           [phone], Esophagogastroduodenoscopy, flexible,                            transoral; with biopsy, single or multiple Diagnosis Code(s):        --- Professional ---                           K44.9, Diaphragmatic hernia without obstruction or                            gangrene                           K29.70, Gastritis, unspecified, without bleeding  K31.89, Other diseases of stomach and duodenum                           D50.9, Iron deficiency anemia, unspecified CPT copyright 2022 American Medical Association. All rights reserved. The codes documented in this report are preliminary and upon coder review may  be revised to meet current compliance requirements. Deatrice Dine, MD Deatrice Dine, MD 04/06/2024 7:49:49 AM This report has been signed electronically. Number of Addenda: 0

## 2024-04-06 NOTE — Discharge Instructions (Signed)

## 2024-04-06 NOTE — Transfer of Care (Signed)
 Immediate Anesthesia Transfer of Care Note  Patient: Tyler Garner  Procedure(s) Performed: COLONOSCOPY EGD (ESOPHAGOGASTRODUODENOSCOPY)  Patient Location: Endoscopy Unit  Anesthesia Type:General  Level of Consciousness: awake, alert , oriented, and patient cooperative  Airway & Oxygen Therapy: Patient Spontanous Breathing  Post-op Assessment: Report given to RN, Post -op Vital signs reviewed and stable, and Patient moving all extremities X 4  Post vital signs: Reviewed and stable  Last Vitals:  Vitals Value Taken Time  BP    Temp    Pulse    Resp    SpO2      Last Pain:  Vitals:   04/06/24 0733  PainSc: 2          Complications: No notable events documented.

## 2024-04-06 NOTE — H&P (Signed)
 Primary Care Physician:  Gladis Mustard, [name] Primary Gastroenterologist:  Dr. Cinderella  Pre-Procedure History & Physical: HPI:  Tyler Garner is a 68 y.o. male  with CAD with hx PCI, HTN, HLD, DM type 2, BPH hx TURP, obesity, hx lumbar laminectomy  who presents for evaluation of Abdominal pain, unintentional weight loss, iron deficiency anemia   Patient had recent hospitalization as he was lethargic noted to have hypoglycemia and pyelonephritis.  Patient reports dark stools for past 6 months.  Takes regularly Goody powders for epigastric pain and headaches.  Recently stopped taking Goody powders.  Patient had around 10 pound unintentional weight loss recently as well .the patient denies having any nausea, vomiting, fever, chills,  hematemesis, abdominal distention, , diarrhea, jaundice, pruritus    Labs from 12/2023 phosphorus less than 1 creatinine 1 point BUN 36 Hemoglobin 8.5 MCV 73 Iron less than 10 ferritin 109   Last ZHI:wnwz Last Colonoscopy:none   FHx: neg for any gastrointestinal/liver disease, no malignancies Social: neg smoking, alcohol or illicit drug use Surgical: no abdominal surgeries  Past Medical History:  Diagnosis Date   Arthritis    hands and hips oa   CAD (coronary artery disease)    a. inferior STEMI (05/2013) with RV involvement- LHC:  dist CFX occluded (Promus premier (2.5x24 mm) DES); EF 40-45% with inf HK;  b. Echo (report pending) with EF 45-50%, lat WMA, RVE, mod RV hypokinesis    Chronic kidney disease    Complication of anesthesia 11/28/2020   confusion x 1 hour after 11-28-2020 back surgery   Foley catheter in place    since 11-28-2020 foley last changed 1 week ago as of 03-25-2021   History of kidney stones    Hyperlipidemia    Hypertension    Ischemic cardiomyopathy    Myocardial infarction (HCC) 2015   Nicotine addiction    Renal colic    Right shoulder injury 10/11/2009   Type 2 DM    Urinary retention    since july 2022    Past Surgical  History:  Procedure Laterality Date   CYSTOURETHROSCOPY, W/ URETHRAL STRICTURE DILATION USING DRUG-COATED BALLOON N/A 03/13/2024   Procedure: CYSTOURETHROSCOPY, WITH URETHRAL STRICTURE DILATION USING DRUG-COATED BALLOON;  Surgeon: Selma Donnice JONELLE, MD;  Location: [facility] ORS;  Service: Urology;  Laterality: N/A;   heart stent  06/21/2013   x 1 to distal LCX   LEFT HEART CATHETERIZATION WITH CORONARY ANGIOGRAM N/A 06/21/2013   Procedure: LEFT HEART CATHETERIZATION WITH CORONARY ANGIOGRAM;  Surgeon: Peter M Jordan, MD;  Location: [facility] CATH LAB;  Service: Cardiovascular;  Laterality: N/A;   LUMBAR LAMINECTOMY/DECOMPRESSION MICRODISCECTOMY N/A 11/28/2020   Procedure: LUMBAR DECOMPRESSION  2 LEVELS, L3 and L5;  Surgeon: Burnetta Aures, MD;  Location: [facility] OR;  Service: Orthopedics;  Laterality: N/A;  3.5 hrs   TRANSURETHRAL RESECTION OF PROSTATE N/A 03/28/2021   Procedure: TRANSURETHRAL RESECTION OF THE PROSTATE (TURP)/ BIPOLAR;  Surgeon: Selma Donnice JONELLE, MD;  Location: [facility];  Service: Urology;  Laterality: N/A;    Prior to Admission medications   Medication Sig Start Date End Date Taking? Authorizing Provider  acetaminophen  (TYLENOL ) 500 MG tablet Take 2 tablets (1,000 mg total) by mouth every 6 (six) hours as needed for mild pain (pain score 1-3). 01/22/24  Yes Johnson, Clanford L, MD  amLODipine  (NORVASC ) 5 MG tablet Take 1 tablet (5 mg total) by mouth daily. 12/10/23  Yes Martin, [name], [name]  aspirin  EC 81 MG tablet Take 81 mg by mouth daily.  Swallow whole.   Yes [provider]  insulin  glargine (LANTUS  SOLOSTAR) 100 UNIT/ML Solostar Pen Inject 20 Units into the skin at bedtime as needed (Only takes for levels over 100). Patient taking differently: Inject 20 Units into the skin in the morning. 01/22/24  Yes Johnson, Clanford L, MD  metFORMIN  (GLUCOPHAGE ) 1000 MG tablet Take 1,000 mg by mouth 2 (two) times daily with a meal.   Yes [provider]  metoprolol   tartrate (LOPRESSOR ) 25 MG tablet Take 1 tablet (25 mg total) by mouth 2 (two) times daily. 12/10/23  Yes Martin, [name], [name]  pantoprazole  (PROTONIX ) 40 MG tablet Take 1 tablet (40 mg total) by mouth daily. 01/22/24  Yes Johnson, Clanford L, MD  rosuvastatin  (CRESTOR ) 40 MG tablet Take 0.5 tablets (20 mg total) by mouth daily. 12/10/23  Yes Martin, [name], [name]  tamsulosin  (FLOMAX ) 0.4 MG CAPS capsule Take 0.4 mg by mouth at bedtime. 12/27/23  Yes [provider]  ferrous sulfate  325 (65 FE) MG tablet Take 325 mg by mouth daily with breakfast.    [provider]    Allergies as of 03/16/2024 - Review Complete 03/13/2024  Allergen Reaction Noted   Percocet [oxycodone -acetaminophen ] Nausea And Vomiting 06/22/2013    Family History  Problem Relation Age of Onset   Diabetes Mother    ALS Mother    Alzheimer's disease Father    Healthy Sister    Diabetes Brother     Social History   Socioeconomic History   Marital status: Widowed    Spouse name: Not on file   Number of children: 2   Years of education: Not on file   Highest education level: Not on file  Occupational History   Occupation: Retired  Tobacco Use   Smoking status: Former    Current packs/day: 0.00    Average packs/day: 1 pack/day for 40.0 years (40.0 ttl pk-yrs)    Types: Cigarettes    Start date: 05/25/1970    Quit date: 05/25/2010    Years since quitting: 13.8   Smokeless tobacco: Former    Quit date: 05/25/2008  Vaping Use   Vaping status: Never Used  Substance and Sexual Activity   Alcohol use: Not Currently   Drug use: No   Sexual activity: Not Currently  Other Topics Concern   Not on file  Social History Narrative   Lives alone. 2 children: One in [place], another in [place]   Social Drivers of Health   Financial Resource Strain: Low Risk  (12/28/2023)   Overall Financial Resource Strain (CARDIA)    Difficulty of Paying Living Expenses: Not hard at all  Food Insecurity: No  Food Insecurity (01/21/2024)   Hunger Vital Sign    Worried About Running Out of Food in the Last Year: Never true    Ran Out of Food in the Last Year: Never true  Transportation Needs: No Transportation Needs (01/21/2024)   PRAPARE - Administrator, Civil Service (Medical): No    Lack of Transportation (Non-Medical): No  Physical Activity: Insufficiently Active (12/28/2023)   Exercise Vital Sign    Days of Exercise per Week: 7 days    Minutes of Exercise per Session: 20 min  Stress: No Stress Concern Present (12/28/2023)   [employer] of Occupational Health - Occupational Stress Questionnaire    Feeling of Stress: Not at all  Social Connections: Socially Isolated (01/21/2024)   Social Connection and Isolation Panel    Frequency of Communication with Friends and Family:  More than three times a week    Frequency of Social Gatherings with Friends and Family: More than three times a week    Attends Religious Services: Never    Database Administrator or Organizations: No    Attends Banker Meetings: Never    Marital Status: Widowed  Intimate Partner Violence: Not At Risk (01/21/2024)   Humiliation, Afraid, Rape, and Kick questionnaire    Fear of Current or Ex-Partner: No    Emotionally Abused: No    Physically Abused: No    Sexually Abused: No    Review of Systems: See HPI, otherwise negative ROS  Physical Exam: Vital signs in last 24 hours: Temp:  [97.8 F (36.6 C)] 97.8 F (36.6 C) (11/13 0652) Resp:  [16] 16 (11/13 0652) BP: (157)/(85) 157/85 (11/13 0652) SpO2:  [98 %] 98 % (11/13 0652)   General:   Alert,  Well-developed, well-nourished, pleasant and cooperative in NAD Head:  Normocephalic and atraumatic. Eyes:  Sclera clear, no icterus.   Conjunctiva pink. Ears:  Normal auditory acuity. Nose:  No deformity, discharge,  or lesions. Msk:  Symmetrical without gross deformities. Normal posture. Extremities:  Without clubbing or edema. Neurologic:   Alert and  oriented x4;  grossly normal neurologically. Skin:  Intact without significant lesions or rashes. Psych:  Alert and cooperative. Normal mood and affect.  Has chronic RLQ pain   Impression/Plan: Tyler Garner is a 68 y.o. male  with CAD with hx PCI, HTN, HLD, DM type 2, BPH hx TURP, obesity, hx lumbar laminectomy  who presents for evaluation of Abdominal pain, unintentional weight loss, iron deficiency anemia  Proceed with upper endoscopy and colonoscopy   The risks of the procedure including infection, bleed, or perforation as well as benefits, limitations, alternatives and imponderables have been reviewed with the patient. Questions have been answered. All parties agreeable.

## 2024-04-07 ENCOUNTER — Ambulatory Visit: Admitting: Student

## 2024-04-07 ENCOUNTER — Encounter (HOSPITAL_COMMUNITY): Payer: Self-pay | Admitting: Gastroenterology

## 2024-04-07 LAB — SURGICAL PATHOLOGY

## 2024-04-10 ENCOUNTER — Ambulatory Visit (INDEPENDENT_AMBULATORY_CARE_PROVIDER_SITE_OTHER): Payer: Self-pay | Admitting: Gastroenterology

## 2024-04-13 NOTE — Progress Notes (Signed)
 1 yr TCS noted in recall Patient result letter mailed Patient's PCP is on EPIC .

## 2024-05-03 ENCOUNTER — Ambulatory Visit: Attending: Student | Admitting: Student

## 2024-05-03 ENCOUNTER — Encounter: Payer: Self-pay | Admitting: Student

## 2024-05-03 VITALS — BP 132/70 | HR 78 | Ht 70.0 in | Wt 220.0 lb

## 2024-05-03 DIAGNOSIS — E785 Hyperlipidemia, unspecified: Secondary | ICD-10-CM

## 2024-05-03 DIAGNOSIS — I493 Ventricular premature depolarization: Secondary | ICD-10-CM | POA: Diagnosis not present

## 2024-05-03 DIAGNOSIS — I2510 Atherosclerotic heart disease of native coronary artery without angina pectoris: Secondary | ICD-10-CM | POA: Diagnosis not present

## 2024-05-03 DIAGNOSIS — I10 Essential (primary) hypertension: Secondary | ICD-10-CM

## 2024-05-03 NOTE — Progress Notes (Signed)
 Cardiology Office Note    Date:  05/03/2024  ID:  Tyler Garner, DOB 06/23/1955, MRN [id number] Cardiologist: Previously Dr. Burnard --> will follow-up in [facility]  History of Present Illness:    Tyler Garner is a 68 y.o. male with past medical history of CAD (s/p DES to LCx in 2015), HTN, HLD and IDDM who presents to the office today for 42-month follow-up.  He was last examined by myself in 02/2024 and was needing preoperative cardiac clearance for upcoming EGD and colonoscopy along with cystoscopy. He reported activity was limited given back/hip pain but was walking to the mailbox and able to climb a flight of stairs without any anginal symptoms. His EKG at that time showed frequent PVC's and episodes of bigeminy, therefore was recommended to obtain an echocardiogram to assess for any structural abnormalities. Was cleared to hold ASA for 5 days prior to his procedure. He was continued on Amlodipine  5 mg daily, ASA 81 mg daily, Lopressor  25 mg twice daily and Crestor  40 mg daily.  His echocardiogram showed a preserved EF of 55% with no regional wall motion abnormalities. He did have grade 1 diastolic dysfunction, normal RV function and severe calcification of the aortic valve but no evidence of stenosis. He underwent cystoscopy in 02/2024 with no immediate complications noted. EGD showed a gastroesophageal flap and he did have polyps that were removed.  In talking with the patient today, he reports overall feeling well since his last office visit. He did stop Protonix  and Flomax  following his recent procedures as he was overall feeling well and denies any side effects after stopping these. He is able to climb a flight of stairs in his home and walk outside without any chest pain or dyspnea on exertion. No recent palpitations, orthopnea, PND or pitting edema. He does consume approximately a half can of [facility] daily but no other caffeine  intake.  Studies Reviewed:   EKG: EKG is  not ordered today.  Echocardiogram: 02/2024 IMPRESSIONS     1. Left ventricular ejection fraction, by estimation, is 55%. The left  ventricle has normal function. The left ventricle has no regional wall  motion abnormalities. The left ventricular internal cavity size was mildly  dilated. Left ventricular diastolic  parameters are consistent with Grade I diastolic dysfunction (impaired  relaxation).   2. Right ventricular systolic function is normal. The right ventricular  size is normal.   3. Left atrial size was moderately dilated.   4. The mitral valve is abnormal. Trivial mitral valve regurgitation. No  evidence of mitral stenosis.   5. Bulky calcification particulary of the non coronary cusp no AS with  calculated AVA 2.2 cm2. The aortic valve is tricuspid. There is severe  calcifcation of the aortic valve. Aortic valve regurgitation is not  visualized. Aortic valve  sclerosis/calcification is present, without any evidence of aortic  stenosis.   6. The inferior vena cava is normal in size with greater than 50%  respiratory variability, suggesting right atrial pressure of 3 mmHg.    Physical Exam:   VS:  BP 132/70   Pulse 78   Ht 5' 10 (1.778 m)   Wt 220 lb (99.8 kg)   SpO2 96%   BMI 31.57 kg/m    Wt Readings from Last 3 Encounters:  05/03/24 220 lb (99.8 kg)  03/13/24 210 lb (95.3 kg)  03/07/24 210 lb (95.3 kg)     GEN: Well nourished, well developed male appearing in no acute distress NECK:  No JVD; No carotid bruits CARDIAC: RRR, no murmurs, rubs, gallops RESPIRATORY:  Clear to auscultation without rales, wheezing or rhonchi  ABDOMEN: Appears non-distended. No obvious abdominal masses. EXTREMITIES: No clubbing or cyanosis. No pitting edema.  Distal pedal pulses are 2+ bilaterally.   Assessment and Plan:   1. Coronary artery disease involving native coronary artery of native heart without angina pectoris - He previously underwent stenting to the LCx in 2015  and most recent ischemic evaluation was a Lexiscan  Myoview  in 02/2021 which showed evidence of prior infarct but no current ischemia. - He remains active at baseline and denies any recent anginal symptoms. We reviewed warning signs to monitor for.   - Continue current medical therapy with ASA 81 mg daily, Lopressor  25 mg twice daily and Crestor  20 mg daily.  2. Essential hypertension - BP was initially recorded at 160/90, rechecked and improved to 132/70 during today's visit. Continue Amlodipine  5 mg daily and Lopressor  25 mg twice daily. I did encourage him to purchase a home blood pressure monitor to follow this in the ambulatory setting.  3. Hyperlipidemia LDL goal <70 - LDL was at 56 in 11/2023. Continue Crestor  20 mg daily.  4. PVC's (premature ventricular contractions) - Noted on prior EKG tracing but no significant ectopy by examination today and it appears this had improved at the time of his echocardiogram as well. If noted to have recurrence, would plan for a 3-day Zio patch to assess his burden. For now, continue Lopressor  25 mg twice daily.   Signed, Laymon CHRISTELLA Qua, PA-C

## 2024-05-03 NOTE — Patient Instructions (Addendum)
 Medication Instructions:   Continue current medications.   *If you need a refill on your cardiac medications before your next appointment, please call your pharmacy*   Follow-Up: At [facility], you and your health needs are our priority.  As part of our continuing mission to provide you with exceptional heart care, our providers are all part of one team.  This team includes your primary Cardiologist (physician) and Advanced Practice Providers or APPs (Physician Assistants and Nurse Practitioners) who all work together to provide you with the care you need, when you need it.  Your next appointment:   1 year   Provider:   You may see Vishnu P Mallipeddi, MD or one of the following Advanced Practice Providers on your designated Care Team:   Shanikqua Zarzycki, PA-C  Scotesia Metamora, [place] Olivia Pavy, [place]     We recommend signing up for the patient portal called MyChart.  Sign up information is provided on this After Visit Summary.  MyChart is used to connect with patients for Virtual Visits (Telemedicine).  Patients are able to view lab/test results, encounter notes, upcoming appointments, etc.  Non-urgent messages can be sent to your provider as well.   To learn more about what you can do with MyChart, go to [email or web address].

## 2024-05-23 ENCOUNTER — Encounter (INDEPENDENT_AMBULATORY_CARE_PROVIDER_SITE_OTHER): Payer: Self-pay | Admitting: Gastroenterology

## 2024-06-09 ENCOUNTER — Ambulatory Visit: Payer: Self-pay | Admitting: Nurse Practitioner

## 2024-06-09 ENCOUNTER — Encounter: Payer: Self-pay | Admitting: Nurse Practitioner

## 2024-06-09 VITALS — BP 162/78 | HR 68 | Temp 98.1°F | Ht 70.0 in | Wt 221.0 lb

## 2024-06-09 DIAGNOSIS — Z7984 Long term (current) use of oral hypoglycemic drugs: Secondary | ICD-10-CM

## 2024-06-09 DIAGNOSIS — E785 Hyperlipidemia, unspecified: Secondary | ICD-10-CM

## 2024-06-09 DIAGNOSIS — E1169 Type 2 diabetes mellitus with other specified complication: Secondary | ICD-10-CM

## 2024-06-09 DIAGNOSIS — Z794 Long term (current) use of insulin: Secondary | ICD-10-CM | POA: Diagnosis not present

## 2024-06-09 DIAGNOSIS — E876 Hypokalemia: Secondary | ICD-10-CM | POA: Diagnosis not present

## 2024-06-09 DIAGNOSIS — I10 Essential (primary) hypertension: Secondary | ICD-10-CM

## 2024-06-09 DIAGNOSIS — I5022 Chronic systolic (congestive) heart failure: Secondary | ICD-10-CM | POA: Diagnosis not present

## 2024-06-09 DIAGNOSIS — N400 Enlarged prostate without lower urinary tract symptoms: Secondary | ICD-10-CM | POA: Diagnosis not present

## 2024-06-09 DIAGNOSIS — E119 Type 2 diabetes mellitus without complications: Secondary | ICD-10-CM | POA: Diagnosis not present

## 2024-06-09 DIAGNOSIS — D500 Iron deficiency anemia secondary to blood loss (chronic): Secondary | ICD-10-CM | POA: Diagnosis not present

## 2024-06-09 LAB — BAYER DCA HB A1C WAIVED: HB A1C (BAYER DCA - WAIVED): 7.8 % — ABNORMAL HIGH (ref 4.8–5.6)

## 2024-06-09 MED ORDER — ROSUVASTATIN CALCIUM 40 MG PO TABS: 20.0000 mg | ORAL_TABLET | Freq: Every day | ORAL | 1 refills | Status: AC

## 2024-06-09 MED ORDER — METFORMIN HCL 1000 MG PO TABS: 1000.0000 mg | ORAL_TABLET | Freq: Two times a day (BID) | ORAL | 1 refills | Status: AC

## 2024-06-09 MED ORDER — LANTUS SOLOSTAR 100 UNIT/ML ~~LOC~~ SOPN: 55.0000 [IU] | PEN_INJECTOR | Freq: Every evening | SUBCUTANEOUS | 5 refills | Status: AC | PRN

## 2024-06-09 MED ORDER — AMLODIPINE BESYLATE 10 MG PO TABS: 10.0000 mg | ORAL_TABLET | Freq: Every day | ORAL | 1 refills | Status: AC

## 2024-06-09 MED ORDER — METOPROLOL TARTRATE 25 MG PO TABS: 25.0000 mg | ORAL_TABLET | Freq: Two times a day (BID) | ORAL | 1 refills | Status: AC

## 2024-06-09 NOTE — Progress Notes (Signed)
 "  Subjective:    Patient ID: Tyler Garner, male    DOB: June 09, 1955, 69 y.o.   MRN: [id number]   Chief Complaint: medical management of chronic issues     HPI:  Tyler Garner is a 70 y.o. who identifies as a male who was assigned male at birth.   Social history: Lives with: his son lives with him Work history: retired   Water Engineer in today for follow up of the following chronic medical issues:  1. Primary hypertension No c/o chest pain, sob or headache. Does not check blood pressure at home. BP Readings from Last 3 Encounters:  05/03/24 132/70  04/06/24 (!) 115/59  03/13/24 (!) 158/77    2. Hyperlipidemia associated with type 2 diabetes mellitus (HCC) Does try to watch diet but does no dedicated exercise. Lab Results  Component Value Date   CHOL 110 12/10/2023   HDL 22 (L) 12/10/2023   LDLCALC 56 12/10/2023   TRIG 194 (H) 12/10/2023   CHOLHDL 5.0 12/10/2023   The ASCVD Risk score (Arnett DK, et al., 2019) failed to calculate for the following reasons:   Risk score cannot be calculated because patient has a medical history suggesting prior/existing ASCVD   * - Cholesterol units were assumed   3. Type 2 diabetes mellitus with diabetic polyneuropathy, with long-term current use of insulin  (HCC) 4. Diabetes mellitus treated with insulin  and oral medication (HCC) Fasting blood sugars are running around 110-150. No low blood sugars. Has numbness in all toes. Lab Results  Component Value Date   HGBA1C 6.4 (H) 03/07/2024     5. Hypokalemia No muscle cramps Lab Results  Component Value Date   K 5.6 (H) 03/07/2024     6. Benign prostatic hyperplasia without lower urinary tract symptoms No voiding issues Lab Results  Component Value Date   PSA1 0.4 09/13/2023   PSA1 0.5 05/19/2022   PSA1 0.9 02/18/2021   PSA 0.68 12/05/2012      7. CHF Last saw cardiology on 05/03/24. Revuew of office note showed no change in care.  8. Iron defieiency anemia Is on daily  iron supplements Lab Results  Component Value Date   HGB 12.2 (L) 03/07/2024     8. Morbid obesity (HCC) Weight down 11 lbs since October  Wt Readings from Last 3 Encounters:  06/09/24 221 lb (100.2 kg)  05/03/24 220 lb (99.8 kg)  03/13/24 210 lb (95.3 kg)   BMI Readings from Last 3 Encounters:  06/09/24 31.71 kg/m  05/03/24 31.57 kg/m  03/13/24 30.13 kg/m       New complaints: None today  Allergies  Allergen Reactions   Percocet [Oxycodone -Acetaminophen ] Nausea And Vomiting   Outpatient Encounter Medications as of 06/09/2024  Medication Sig   acetaminophen  (TYLENOL ) 500 MG tablet Take 2 tablets (1,000 mg total) by mouth every 6 (six) hours as needed for mild pain (pain score 1-3).   amLODipine  (NORVASC ) 5 MG tablet Take 1 tablet (5 mg total) by mouth daily.   aspirin  EC 81 MG tablet Take 81 mg by mouth daily. Swallow whole.   ferrous sulfate  325 (65 FE) MG tablet Take 325 mg by mouth daily with breakfast.   insulin  glargine (LANTUS  SOLOSTAR) 100 UNIT/ML Solostar Pen Inject 20 Units into the skin at bedtime as needed (Only takes for levels over 100).   metFORMIN  (GLUCOPHAGE ) 1000 MG tablet Take 1,000 mg by mouth 2 (two) times daily with a meal.   metoprolol  tartrate (LOPRESSOR ) 25 MG tablet Take 1  tablet (25 mg total) by mouth 2 (two) times daily.   rosuvastatin  (CRESTOR ) 40 MG tablet Take 0.5 tablets (20 mg total) by mouth daily.   No facility-administered encounter medications on file as of 06/09/2024.    Past Surgical History:  Procedure Laterality Date   COLONOSCOPY N/A 04/06/2024   Procedure: COLONOSCOPY;  Surgeon: Cinderella Deatrice FALCON, MD;  Location: [facility];  Service: Endoscopy;  Laterality: N/A;  7:30am, ASA 3   CYSTOURETHROSCOPY, W/ URETHRAL STRICTURE DILATION USING DRUG-COATED BALLOON N/A 03/13/2024   Procedure: CYSTOURETHROSCOPY, WITH URETHRAL STRICTURE DILATION USING DRUG-COATED BALLOON;  Surgeon: Selma Donnice SAUNDERS, MD;  Location: [facility] ORS;  Service:  Urology;  Laterality: N/A;   ESOPHAGOGASTRODUODENOSCOPY N/A 04/06/2024   Procedure: EGD (ESOPHAGOGASTRODUODENOSCOPY);  Surgeon: Cinderella Deatrice FALCON, MD;  Location: [facility];  Service: Endoscopy;  Laterality: N/A;   heart stent  06/21/2013   x 1 to distal LCX   LEFT HEART CATHETERIZATION WITH CORONARY ANGIOGRAM N/A 06/21/2013   Procedure: LEFT HEART CATHETERIZATION WITH CORONARY ANGIOGRAM;  Surgeon: Peter M Jordan, MD;  Location: [facility] CATH LAB;  Service: Cardiovascular;  Laterality: N/A;   LUMBAR LAMINECTOMY/DECOMPRESSION MICRODISCECTOMY N/A 11/28/2020   Procedure: LUMBAR DECOMPRESSION  2 LEVELS, L3 and L5;  Surgeon: Burnetta Aures, MD;  Location: [facility] OR;  Service: Orthopedics;  Laterality: N/A;  3.5 hrs   TRANSURETHRAL RESECTION OF PROSTATE N/A 03/28/2021   Procedure: TRANSURETHRAL RESECTION OF THE PROSTATE (TURP)/ BIPOLAR;  Surgeon: Selma Donnice SAUNDERS, MD;  Location: [facility];  Service: Urology;  Laterality: N/A;    Family History  Problem Relation Age of Onset   Diabetes Mother    ALS Mother    Alzheimer's disease Father    Healthy Sister    Diabetes Brother       Controlled substance contract: n/a     Review of Systems  Constitutional:  Negative for diaphoresis.  Eyes:  Negative for pain.  Respiratory:  Negative for shortness of breath.   Cardiovascular:  Negative for chest pain, palpitations and leg swelling.  Gastrointestinal:  Negative for abdominal pain.  Endocrine: Negative for polydipsia.  Skin:  Negative for rash.  Neurological:  Negative for dizziness, weakness and headaches.  Hematological:  Does not bruise/bleed easily.  All other systems reviewed and are negative.      Objective:   Physical Exam Vitals and nursing note reviewed.  Constitutional:      Appearance: Normal appearance. He is well-developed.  HENT:     Head: Normocephalic.     Nose: Nose normal.     Mouth/Throat:     Mouth: Mucous membranes are moist.     Pharynx: Oropharynx  is clear.  Eyes:     Pupils: Pupils are equal, round, and reactive to light.  Neck:     Thyroid : No thyroid  mass or thyromegaly.     Vascular: No carotid bruit or JVD.     Trachea: Phonation normal.  Cardiovascular:     Rate and Rhythm: Normal rate and regular rhythm.  Pulmonary:     Effort: Pulmonary effort is normal. No respiratory distress.     Breath sounds: Normal breath sounds.  Abdominal:     General: Bowel sounds are normal.     Palpations: Abdomen is soft.     Tenderness: There is no abdominal tenderness.  Musculoskeletal:        General: Normal range of motion.     Cervical back: Normal range of motion and neck supple.     Comments:  [employer] with a cane  Lymphadenopathy:     Cervical: No cervical adenopathy.  Skin:    General: Skin is warm and dry.  Neurological:     Mental Status: He is alert and oriented to person, place, and time.  Psychiatric:        Behavior: Behavior normal.        Thought Content: Thought content normal.        Judgment: Judgment normal.    BP (!) 162/78   Pulse 68   Temp 98.1 F (36.7 C) (Temporal)   Ht 5' 10 (1.778 m)   Wt 221 lb (100.2 kg)   SpO2 98%   BMI 31.71 kg/m      HBA1c 7.8%       Assessment & Plan:  Tyler Garner comes in today with chief complaint of Medical Management of Chronic Issues   Diagnosis and orders addressed:  1. Primary hypertension (Primary) Dash diet - CBC with Differential/Platelet - CMP14+EGFR - amLODipine  (NORVASC ) 10 MG tablet; Take 1 tablet (10 mg total) by mouth daily.  Dispense: 90 tablet; Refill: 1  2. Hyperlipidemia associated with type 2 diabetes mellitus (HCC) Low fat diet - Lipid panel - rosuvastatin  (CRESTOR ) 40 MG tablet; Take 0.5 tablets (20 mg total) by mouth daily.  Dispense: 135 tablet; Refill: 1  3. Diabetes mellitus treated with insulin  and oral medication (HCC) Increase lantus  to 55u daily - Bayer DCA Hb A1c Waived - insulin  glargine (LANTUS  SOLOSTAR) 100 UNIT/ML  Solostar Pen; Inject 55 Units into the skin at bedtime as needed (Only takes for levels over 100).  Dispense: 15 mL; Refill: 5 - metFORMIN  (GLUCOPHAGE ) 1000 MG tablet; Take 1 tablet (1,000 mg total) by mouth 2 (two) times daily with a meal.  Dispense: 180 tablet; Refill: 1  4. Chronic systolic CHF (congestive heart failure) (HCC) Keep follow up with cardiology - metoprolol  tartrate (LOPRESSOR ) 25 MG tablet; Take 1 tablet (25 mg total) by mouth 2 (two) times daily.  Dispense: 180 tablet; Refill: 1  5. Hypokalemia Labs pending  6. Benign prostatic hyperplasia without lower urinary tract symptoms Report any voiding issues  7. Morbid obesity (HCC) Discussed diet and exercise for person with BMI >25 Will recheck weight in 3-6 months   8. Iron deficiency anemia due to chronic blood loss Labs pending   Labs pending Health Maintenance reviewed Diet and exercise encouraged  Follow up plan: 3 months   [name] Gladis, [name]   "

## 2024-06-09 NOTE — Patient Instructions (Signed)

## 2024-06-10 LAB — CMP14+EGFR
ALT: 13 IU/L (ref 0–44)
AST: 17 IU/L (ref 0–40)
Albumin: 4.5 g/dL (ref 3.9–4.9)
Alkaline Phosphatase: 67 IU/L (ref 47–123)
BUN/Creatinine Ratio: 14 (ref 10–24)
BUN: 13 mg/dL (ref 8–27)
Bilirubin Total: 0.2 mg/dL (ref 0.0–1.2)
CO2: 21 mmol/L (ref 20–29)
Calcium: 10.2 mg/dL (ref 8.6–10.2)
Chloride: 97 mmol/L (ref 96–106)
Creatinine, Ser: 0.96 mg/dL (ref 0.76–1.27)
Globulin, Total: 2.8 g/dL (ref 1.5–4.5)
Glucose: 276 mg/dL — ABNORMAL HIGH (ref 70–99)
Potassium: 5.7 mmol/L — ABNORMAL HIGH (ref 3.5–5.2)
Sodium: 136 mmol/L (ref 134–144)
Total Protein: 7.3 g/dL (ref 6.0–8.5)
eGFR: 86 mL/min/1.73

## 2024-06-10 LAB — CBC WITH DIFFERENTIAL/PLATELET
Basophils Absolute: 0.1 x10E3/uL (ref 0.0–0.2)
Basos: 1 %
EOS (ABSOLUTE): 0.1 x10E3/uL (ref 0.0–0.4)
Eos: 1 %
Hematocrit: 43.4 % (ref 37.5–51.0)
Hemoglobin: 13.7 g/dL (ref 13.0–17.7)
Immature Grans (Abs): 0 x10E3/uL (ref 0.0–0.1)
Immature Granulocytes: 0 %
Lymphocytes Absolute: 2.2 x10E3/uL (ref 0.7–3.1)
Lymphs: 26 %
MCH: 28.2 pg (ref 26.6–33.0)
MCHC: 31.6 g/dL (ref 31.5–35.7)
MCV: 89 fL (ref 79–97)
Monocytes Absolute: 0.7 x10E3/uL (ref 0.1–0.9)
Monocytes: 8 %
Neutrophils Absolute: 5.3 x10E3/uL (ref 1.4–7.0)
Neutrophils: 64 %
Platelets: 279 x10E3/uL (ref 150–450)
RBC: 4.86 x10E6/uL (ref 4.14–5.80)
RDW: 16.3 % — ABNORMAL HIGH (ref 11.6–15.4)
WBC: 8.3 x10E3/uL (ref 3.4–10.8)

## 2024-06-10 LAB — LIPID PANEL
Chol/HDL Ratio: 7 ratio — ABNORMAL HIGH (ref 0.0–5.0)
Cholesterol, Total: 148 mg/dL (ref 100–199)
HDL: 21 mg/dL — ABNORMAL LOW
LDL Chol Calc (NIH): 44 mg/dL (ref 0–99)
Triglycerides: 580 mg/dL (ref 0–149)
VLDL Cholesterol Cal: 83 mg/dL — ABNORMAL HIGH (ref 5–40)

## 2024-06-12 ENCOUNTER — Ambulatory Visit: Payer: Self-pay | Admitting: Nurse Practitioner

## 2024-06-12 DIAGNOSIS — E875 Hyperkalemia: Secondary | ICD-10-CM

## 2024-06-13 ENCOUNTER — Other Ambulatory Visit

## 2024-09-07 ENCOUNTER — Ambulatory Visit: Admitting: Nurse Practitioner

## 2024-12-28 ENCOUNTER — Ambulatory Visit: Payer: Self-pay
